# Patient Record
Sex: Male | Born: 1960 | Race: White | Hispanic: No | Smoking: Never smoker
Health system: Southern US, Community
[De-identification: ages and names within clinical notes are randomized; demographics above are authoritative.]

## PROBLEM LIST (undated history)

## (undated) DIAGNOSIS — G4700 Insomnia, unspecified: Secondary | ICD-10-CM

## (undated) DIAGNOSIS — D649 Anemia, unspecified: Secondary | ICD-10-CM

## (undated) DIAGNOSIS — K219 Gastro-esophageal reflux disease without esophagitis: Secondary | ICD-10-CM

## (undated) DIAGNOSIS — E119 Type 2 diabetes mellitus without complications: Secondary | ICD-10-CM

## (undated) DIAGNOSIS — G4730 Sleep apnea, unspecified: Secondary | ICD-10-CM

## (undated) DIAGNOSIS — F32A Depression, unspecified: Secondary | ICD-10-CM

## (undated) DIAGNOSIS — F329 Major depressive disorder, single episode, unspecified: Secondary | ICD-10-CM

## (undated) DIAGNOSIS — K274 Chronic or unspecified peptic ulcer, site unspecified, with hemorrhage: Secondary | ICD-10-CM

## (undated) DIAGNOSIS — F419 Anxiety disorder, unspecified: Secondary | ICD-10-CM

## (undated) DIAGNOSIS — F3112 Bipolar disorder, current episode manic without psychotic features, moderate: Secondary | ICD-10-CM

## (undated) DIAGNOSIS — I10 Essential (primary) hypertension: Secondary | ICD-10-CM

## (undated) DIAGNOSIS — G20A1 Parkinson's disease without dyskinesia, without mention of fluctuations: Secondary | ICD-10-CM

## (undated) DIAGNOSIS — E785 Hyperlipidemia, unspecified: Secondary | ICD-10-CM

## (undated) DIAGNOSIS — Z5189 Encounter for other specified aftercare: Secondary | ICD-10-CM

## (undated) DIAGNOSIS — G20 Parkinson's disease: Secondary | ICD-10-CM

## (undated) DIAGNOSIS — I739 Peripheral vascular disease, unspecified: Secondary | ICD-10-CM

## (undated) HISTORY — DX: Gastro-esophageal reflux disease without esophagitis: K21.9

## (undated) HISTORY — DX: Sleep apnea, unspecified: G47.30

## (undated) HISTORY — DX: Hyperlipidemia, unspecified: E78.5

## (undated) HISTORY — DX: Chronic or unspecified peptic ulcer, site unspecified, with hemorrhage: K27.4

## (undated) HISTORY — DX: Anemia, unspecified: D64.9

## (undated) HISTORY — DX: Depression, unspecified: F32.A

## (undated) HISTORY — DX: Encounter for other specified aftercare: Z51.89

## (undated) HISTORY — DX: Insomnia, unspecified: G47.00

## (undated) HISTORY — DX: Essential (primary) hypertension: I10

## (undated) HISTORY — PX: FOOT FRACTURE SURGERY: SHX645

## (undated) HISTORY — DX: Peripheral vascular disease, unspecified: I73.9

## (undated) HISTORY — DX: Bipolar disorder, current episode manic without psychotic features, moderate: F31.12

## (undated) HISTORY — DX: Major depressive disorder, single episode, unspecified: F32.9

## (undated) HISTORY — DX: Type 2 diabetes mellitus without complications: E11.9

## (undated) HISTORY — DX: Anxiety disorder, unspecified: F41.9

---

## 2004-04-30 ENCOUNTER — Other Ambulatory Visit: Payer: Self-pay

## 2004-09-12 ENCOUNTER — Ambulatory Visit: Payer: Self-pay | Admitting: Family Medicine

## 2006-05-26 ENCOUNTER — Ambulatory Visit: Payer: Self-pay | Admitting: Family Medicine

## 2007-05-07 ENCOUNTER — Emergency Department: Payer: Self-pay | Admitting: Internal Medicine

## 2008-03-19 DIAGNOSIS — F411 Generalized anxiety disorder: Secondary | ICD-10-CM | POA: Insufficient documentation

## 2009-05-07 ENCOUNTER — Ambulatory Visit: Payer: Self-pay | Admitting: Family Medicine

## 2009-05-10 ENCOUNTER — Emergency Department: Payer: Self-pay | Admitting: Emergency Medicine

## 2009-09-24 ENCOUNTER — Ambulatory Visit: Payer: Self-pay | Admitting: Physician Assistant

## 2013-04-20 ENCOUNTER — Ambulatory Visit: Payer: Self-pay | Admitting: Family Medicine

## 2014-03-15 DIAGNOSIS — M797 Fibromyalgia: Secondary | ICD-10-CM | POA: Insufficient documentation

## 2014-05-29 DIAGNOSIS — J383 Other diseases of vocal cords: Secondary | ICD-10-CM | POA: Insufficient documentation

## 2014-09-14 ENCOUNTER — Emergency Department: Payer: Self-pay | Admitting: Student

## 2014-11-14 ENCOUNTER — Ambulatory Visit: Payer: Self-pay | Admitting: Family Medicine

## 2015-04-04 ENCOUNTER — Encounter: Payer: Self-pay | Admitting: Emergency Medicine

## 2015-04-09 ENCOUNTER — Other Ambulatory Visit: Payer: Self-pay | Admitting: Family Medicine

## 2015-04-09 ENCOUNTER — Ambulatory Visit (INDEPENDENT_AMBULATORY_CARE_PROVIDER_SITE_OTHER): Payer: 59 | Admitting: Family Medicine

## 2015-04-09 ENCOUNTER — Encounter: Payer: Self-pay | Admitting: Family Medicine

## 2015-04-09 VITALS — BP 160/86 | HR 86 | Temp 97.7°F | Resp 16 | Ht 71.0 in | Wt 236.1 lb

## 2015-04-09 DIAGNOSIS — G4733 Obstructive sleep apnea (adult) (pediatric): Secondary | ICD-10-CM | POA: Diagnosis not present

## 2015-04-09 DIAGNOSIS — M797 Fibromyalgia: Secondary | ICD-10-CM | POA: Diagnosis not present

## 2015-04-09 DIAGNOSIS — E1143 Type 2 diabetes mellitus with diabetic autonomic (poly)neuropathy: Secondary | ICD-10-CM

## 2015-04-09 DIAGNOSIS — I10 Essential (primary) hypertension: Secondary | ICD-10-CM | POA: Diagnosis not present

## 2015-04-09 DIAGNOSIS — E669 Obesity, unspecified: Secondary | ICD-10-CM

## 2015-04-09 DIAGNOSIS — E1169 Type 2 diabetes mellitus with other specified complication: Secondary | ICD-10-CM | POA: Insufficient documentation

## 2015-04-09 DIAGNOSIS — F3012 Manic episode without psychotic symptoms, moderate: Secondary | ICD-10-CM

## 2015-04-09 DIAGNOSIS — G20A1 Parkinson's disease without dyskinesia, without mention of fluctuations: Secondary | ICD-10-CM

## 2015-04-09 DIAGNOSIS — E785 Hyperlipidemia, unspecified: Secondary | ICD-10-CM | POA: Diagnosis not present

## 2015-04-09 DIAGNOSIS — E119 Type 2 diabetes mellitus without complications: Secondary | ICD-10-CM

## 2015-04-09 DIAGNOSIS — H905 Unspecified sensorineural hearing loss: Secondary | ICD-10-CM | POA: Insufficient documentation

## 2015-04-09 DIAGNOSIS — M549 Dorsalgia, unspecified: Secondary | ICD-10-CM | POA: Insufficient documentation

## 2015-04-09 DIAGNOSIS — G20 Parkinson's disease: Secondary | ICD-10-CM

## 2015-04-09 DIAGNOSIS — G990 Autonomic neuropathy in diseases classified elsewhere: Secondary | ICD-10-CM | POA: Diagnosis not present

## 2015-04-09 DIAGNOSIS — E1165 Type 2 diabetes mellitus with hyperglycemia: Secondary | ICD-10-CM | POA: Diagnosis not present

## 2015-04-09 DIAGNOSIS — IMO0002 Reserved for concepts with insufficient information to code with codable children: Secondary | ICD-10-CM

## 2015-04-09 DIAGNOSIS — E786 Lipoprotein deficiency: Secondary | ICD-10-CM | POA: Insufficient documentation

## 2015-04-09 DIAGNOSIS — E1142 Type 2 diabetes mellitus with diabetic polyneuropathy: Secondary | ICD-10-CM | POA: Diagnosis not present

## 2015-04-09 DIAGNOSIS — R9431 Abnormal electrocardiogram [ECG] [EKG]: Secondary | ICD-10-CM | POA: Insufficient documentation

## 2015-04-09 LAB — GLUCOSE, POCT (MANUAL RESULT ENTRY): POC Glucose: 212 mg/dl — AB (ref 70–99)

## 2015-04-09 LAB — POCT GLYCOSYLATED HEMOGLOBIN (HGB A1C): Hemoglobin A1C: 10.5

## 2015-04-09 MED ORDER — INSULIN DETEMIR 100 UNIT/ML ~~LOC~~ SOLN: 10.0000 [IU] | Freq: Every day | SUBCUTANEOUS | Status: AC

## 2015-04-09 MED ORDER — SIMVASTATIN 40 MG PO TABS: 40.0000 mg | ORAL_TABLET | Freq: Every day | ORAL | Status: DC

## 2015-04-09 MED ORDER — GLYBURIDE-METFORMIN 2.5-500 MG PO TABS: 2.0000 | ORAL_TABLET | Freq: Two times a day (BID) | ORAL | Status: DC

## 2015-04-09 MED ORDER — INSULIN DETEMIR 100 UNIT/ML FLEXPEN: 10.0000 [IU] | PEN_INJECTOR | Freq: Every day | SUBCUTANEOUS | Status: DC

## 2015-04-09 MED ORDER — MELOXICAM 15 MG PO TABS: 15.0000 mg | ORAL_TABLET | Freq: Every day | ORAL | Status: DC

## 2015-04-09 NOTE — Progress Notes (Signed)
Name: Bryan Flowers   MRN: [id number]    DOB: 1961-01-08   Date:04/09/2015       Progress Note  Subjective  Chief Complaint  Chief Complaint  Patient presents with  . Hypertension  . Hyperlipidemia  . Depression  . Diabetes  . Tremors    Hypertension This is a chronic problem. The current episode started more than 1 year ago. The problem has been gradually worsening since onset. The problem is uncontrolled. Associated symptoms include palpitations. Pertinent negatives include no blurred vision, chest pain, headaches, neck pain, orthopnea or shortness of breath. Associated agents: Antipsychotics. Risk factors for coronary artery disease include diabetes mellitus, dyslipidemia, male gender, obesity, sedentary lifestyle and stress. Past treatments include ACE inhibitors and diuretics. The current treatment provides no improvement. Compliance problems include psychosocial issues.   Hyperlipidemia This is a chronic problem. The current episode started more than 1 year ago. The problem is uncontrolled. Recent lipid tests were reviewed and are high. Exacerbating diseases include diabetes. Factors aggravating his hyperlipidemia include fatty foods and thiazides. Pertinent negatives include no chest pain, focal weakness, myalgias or shortness of breath. Current antihyperlipidemic treatment includes statins (Vascepa). The current treatment provides mild improvement of lipids. There are no compliance problems.  Risk factors for coronary artery disease include diabetes mellitus, hypertension, male sex, obesity and a sedentary lifestyle.  Diabetes He presents for his follow-up diabetic visit. He has type 2 diabetes mellitus. His disease course has been improving. Hypoglycemia symptoms include nervousness/anxiousness. Pertinent negatives for hypoglycemia include no dizziness, headaches, seizures or tremors. Associated symptoms include fatigue. Pertinent negatives for diabetes include no blurred vision, no  chest pain, no weakness and no weight loss. There are no hypoglycemic complications. Symptoms are improving. Diabetic complications include peripheral neuropathy. Pertinent negatives for diabetic complications include no autonomic neuropathy or heart disease. Current diabetic treatment includes oral agent (triple therapy). His weight is decreasing steadily. He is following a diabetic diet. He rarely participates in exercise. His overall blood glucose range is >200 mg/dl. An ACE inhibitor/angiotensin II receptor blocker is contraindicated.     Parkinson's disease.  Patient's Parkinson's tremor is improved on his current Sinemet regimen.  Bipolar disorder and depression  Patient currently seen a psychiatrist on a regular basis and is currently on Depakote and Cymbalta along with lorazepam and is managing much better with that.  Past Medical History  Diagnosis Date  . Depression   . Diabetes mellitus without complication   . Hyperlipidemia   . Hypertension   . Bipolar 1 disorder, manic, moderate   . GERD (gastroesophageal reflux disease)   . Insomnia     History  Substance Use Topics  . Smoking status: Never Smoker   . Smokeless tobacco: Not on file  . Alcohol Use: No     Current outpatient prescriptions:  .  carbidopa-levodopa (SINEMET CR) 50-200 MG per tablet, , Disp: , Rfl:  .  DULoxetine (CYMBALTA) 60 MG capsule, , Disp: , Rfl:  .  glyBURIDE-metformin (GLUCOVANCE) 2.5-500 MG per tablet, , Disp: , Rfl:  .  lamoTRIgine (LAMICTAL) 200 MG tablet, , Disp: , Rfl:  .  LORazepam (ATIVAN) 0.5 MG tablet, , Disp: , Rfl:  .  meloxicam (MOBIC) 15 MG tablet, , Disp: , Rfl:  .  propranolol ER (INDERAL LA) 60 MG 24 hr capsule, , Disp: , Rfl:  .  quinapril-hydrochlorothiazide (ACCURETIC) 20-12.5 MG per tablet, , Disp: , Rfl:   No Known Allergies  Review of Systems  Constitutional: Positive for fatigue.  Negative for fever, chills and weight loss.       Obesity  HENT: Negative for  congestion, hearing loss, sore throat and tinnitus.   Eyes: Negative for blurred vision, double vision and redness.  Respiratory: Negative for cough, hemoptysis, shortness of breath and wheezing.   Cardiovascular: Positive for palpitations. Negative for chest pain, orthopnea, claudication and leg swelling.  Gastrointestinal: Negative for heartburn, nausea, vomiting, diarrhea, constipation and blood in stool.  Genitourinary: Negative for dysuria, urgency, frequency and hematuria.  Musculoskeletal: Negative for myalgias, back pain, joint pain, falls and neck pain.  Skin: Negative.  Negative for itching.  Neurological: Negative for dizziness, tingling, tremors, focal weakness, seizures, loss of consciousness, weakness and headaches.  Endo/Heme/Allergies: Does not bruise/bleed easily.  Psychiatric/Behavioral: Positive for depression and memory loss. Negative for substance abuse. The patient is nervous/anxious and has insomnia.      Objective  Filed Vitals:   04/09/15 0818  BP: 160/86  Pulse: 86  Temp: 97.7 F (36.5 C)  Resp: 16  Height: 5\' 11"  (1.803 m)  Weight: 236 lb 2 oz (107.106 kg)  SpO2: 98%     Physical Exam  Constitutional: He is oriented to person, place, and time and well-developed, well-nourished, and in no distress.  Obese  HENT:  Head: Normocephalic.  Eyes: EOM are normal. Pupils are equal, round, and reactive to light.  Neck: Normal range of motion. Neck supple. No thyromegaly present.  Cardiovascular: Normal rate, regular rhythm and normal heart sounds.   No murmur heard. Occasional ectopic beat  Pulmonary/Chest: Effort normal and breath sounds normal. No respiratory distress. He has no wheezes.  Abdominal: Soft. Bowel sounds are normal.  Musculoskeletal: Normal range of motion. He exhibits edema.  Lymphadenopathy:    He has no cervical adenopathy.  Neurological: He is alert and oriented to person, place, and time. No cranial nerve deficit. Gait normal.  Coordination normal.  Skin: Skin is warm and dry. No rash noted.  Psychiatric: Affect and judgment normal.  Flat affect      Assessment & Plan.diag  1.  Type 2 diabetes mellitus with hyperglycemia  - POCT HgB A1C - POCT Glucose (CBG) - Insulin Detemir (LEVEMIR FLEXTOUCH) 100 UNIT/ML Pen; Inject 10 Units into the skin daily at 10 pm.  Dispense: 15 mL; Refill: 11 - insulin detemir (LEVEMIR) injection 10 Units; Inject 0.1 mLs (10 Units total) into the skin daily. - Fructosamine  2. Essential hypertension Uncontrolled encourage compliance with his regimen and recheck in 1-2 months - Comprehensive Metabolic Panel (CMET)  3. Obstructive sleep apnea Use CPAP regularly     4. Bipolar I disorder, single manic episode, moderate Per psychiatry  5. HLD (hyperlipidemia)  - Lipid Profile - TSH  7. Idiopathic Parkinson's disease Improved currently on anti-Parkinson's regimen per his neurologist  8. Fibrositis Continues. Problem

## 2015-04-09 NOTE — Patient Instructions (Addendum)

## 2015-04-10 LAB — COMPREHENSIVE METABOLIC PANEL
A/G RATIO: 2.2 (ref 1.1–2.5)
ALT: 9 IU/L (ref 0–44)
AST: 15 IU/L (ref 0–40)
Albumin: 4.6 g/dL (ref 3.5–5.5)
Alkaline Phosphatase: 72 IU/L (ref 39–117)
BILIRUBIN TOTAL: 0.2 mg/dL (ref 0.0–1.2)
BUN/Creatinine Ratio: 24 — ABNORMAL HIGH (ref 9–20)
BUN: 22 mg/dL (ref 6–24)
CHLORIDE: 93 mmol/L — AB (ref 97–108)
CO2: 22 mmol/L (ref 18–29)
Calcium: 9.4 mg/dL (ref 8.7–10.2)
Creatinine, Ser: 0.9 mg/dL (ref 0.76–1.27)
GFR, EST AFRICAN AMERICAN: 112 mL/min/{1.73_m2} (ref >59)
GFR, EST NON AFRICAN AMERICAN: 97 mL/min/{1.73_m2} (ref >59)
Globulin, Total: 2.1 g/dL (ref 1.5–4.5)
Glucose: 233 mg/dL — ABNORMAL HIGH (ref 65–99)
POTASSIUM: 4 mmol/L (ref 3.5–5.2)
Sodium: 135 mmol/L (ref 134–144)
Total Protein: 6.7 g/dL (ref 6.0–8.5)

## 2015-04-10 LAB — LIPID PANEL
Chol/HDL Ratio: 9.9 ratio units — ABNORMAL HIGH (ref 0.0–5.0)
Cholesterol, Total: 307 mg/dL — ABNORMAL HIGH (ref 100–199)
HDL: 31 mg/dL — ABNORMAL LOW (ref >39)
Triglycerides: 752 mg/dL (ref 0–149)

## 2015-04-10 LAB — FRUCTOSAMINE: FRUCTOSAMINE: 469 umol/L — AB (ref 0–285)

## 2015-04-10 LAB — TSH: TSH: 1.49 u[IU]/mL (ref 0.450–4.500)

## 2015-05-09 ENCOUNTER — Ambulatory Visit: Payer: 59 | Admitting: Family Medicine

## 2015-05-15 ENCOUNTER — Encounter: Payer: Self-pay | Admitting: Family Medicine

## 2015-05-15 ENCOUNTER — Ambulatory Visit (INDEPENDENT_AMBULATORY_CARE_PROVIDER_SITE_OTHER): Payer: 59 | Admitting: Family Medicine

## 2015-05-15 VITALS — BP 118/78 | HR 82 | Temp 97.8°F | Resp 16 | Ht 71.0 in | Wt 234.3 lb

## 2015-05-15 DIAGNOSIS — F3012 Manic episode without psychotic symptoms, moderate: Secondary | ICD-10-CM | POA: Diagnosis not present

## 2015-05-15 DIAGNOSIS — E1165 Type 2 diabetes mellitus with hyperglycemia: Secondary | ICD-10-CM

## 2015-05-15 DIAGNOSIS — G990 Autonomic neuropathy in diseases classified elsewhere: Secondary | ICD-10-CM

## 2015-05-15 DIAGNOSIS — E1142 Type 2 diabetes mellitus with diabetic polyneuropathy: Secondary | ICD-10-CM | POA: Diagnosis not present

## 2015-05-15 DIAGNOSIS — IMO0002 Reserved for concepts with insufficient information to code with codable children: Secondary | ICD-10-CM

## 2015-05-15 DIAGNOSIS — I10 Essential (primary) hypertension: Secondary | ICD-10-CM | POA: Diagnosis not present

## 2015-05-15 DIAGNOSIS — E785 Hyperlipidemia, unspecified: Secondary | ICD-10-CM

## 2015-05-15 DIAGNOSIS — E1143 Type 2 diabetes mellitus with diabetic autonomic (poly)neuropathy: Secondary | ICD-10-CM

## 2015-05-15 NOTE — Progress Notes (Signed)
Name: Bryan Flowers   MRN: [id number]    DOB: [birth year]   Date:05/15/2015       Progress Note  Subjective  Chief Complaint  Chief Complaint  Patient presents with  . Diabetes    pt here for 1 mth f/u for DM and fructosimine  . Abnormal Lab    cholesterol     Diabetes He presents for his follow-up diabetic visit. He has type 2 diabetes mellitus. His disease course has been worsening. There are no hypoglycemic associated symptoms. Pertinent negatives for hypoglycemia include no dizziness, headaches, nervousness/anxiousness, seizures or tremors. Associated symptoms include blurred vision, fatigue and foot paresthesias. Pertinent negatives for diabetes include no chest pain, no weakness and no weight loss. Symptoms are worsening. Diabetic complications include peripheral neuropathy. Risk factors for coronary artery disease include diabetes mellitus, dyslipidemia, hypertension, male sex, obesity, sedentary lifestyle and stress. Current diabetic treatment includes insulin injections and oral agent (dual therapy). He is compliant with treatment most of the time. His weight is decreasing steadily. He is following a diabetic diet. His home blood glucose trend is increasing steadily. An ACE inhibitor/angiotensin II receptor blocker is being taken. He does not see a podiatrist. Hyperlipidemia This is a chronic problem. The current episode started more than 1 year ago. The problem is uncontrolled. Recent lipid tests were reviewed and are high. Medical and dietary and exercise noncompliance. Factors aggravating his hyperlipidemia include fatty foods. Pertinent negatives include no chest pain, focal weakness, myalgias or shortness of breath. Current antihyperlipidemic treatment includes statins. Compliance problems include psychosocial issues.   Hypertension This is a chronic problem. The current episode started more than 1 year ago. The problem is unchanged. The problem is controlled. Associated symptoms  include blurred vision. Pertinent negatives include no chest pain, headaches, neck pain, orthopnea, palpitations or shortness of breath. There are no associated agents to hypertension. Past treatments include ACE inhibitors, diuretics and beta blockers. The current treatment provides moderate improvement.    bipolar disorder  Patient currently seeing psychiatrist regular basis for his bipolar disorder. It is much more stable on his current regimen of Lamictal. Testosterone propranolol and duloxetine along with lorazepam.      Past Medical History  Diagnosis Date  . Depression   . Diabetes mellitus without complication   . Hyperlipidemia   . Hypertension   . Bipolar 1 disorder, manic, moderate   . GERD (gastroesophageal reflux disease)   . Insomnia     Social History  Substance Use Topics  . Smoking status: Never Smoker   . Smokeless tobacco: Not on file  . Alcohol Use: No     Current outpatient prescriptions:  .  carbidopa-levodopa (SINEMET CR) 50-200 MG per tablet, , Disp: , Rfl:  .  DULoxetine (CYMBALTA) 60 MG capsule, , Disp: , Rfl:  .  glyBURIDE-metformin (GLUCOVANCE) 2.5-500 MG per tablet, Take 2 tablets by mouth 2 (two) times daily with a meal., Disp: 120 tablet, Rfl: 3 .  lamoTRIgine (LAMICTAL) 200 MG tablet, , Disp: , Rfl:  .  LEVEMIR FLEXTOUCH 100 UNIT/ML Pen, , Disp: , Rfl:  .  LORazepam (ATIVAN) 0.5 MG tablet, , Disp: , Rfl:  .  meloxicam (MOBIC) 15 MG tablet, Take 1 tablet (15 mg total) by mouth daily., Disp: 30 tablet, Rfl: 1 .  propranolol ER (INDERAL LA) 60 MG 24 hr capsule, , Disp: , Rfl:  .  quinapril-hydrochlorothiazide (ACCURETIC) 20-12.5 MG per tablet, , Disp: , Rfl:  .  simvastatin (ZOCOR) 40 MG tablet, Take  1 tablet (40 mg total) by mouth at bedtime., Disp: 90 tablet, Rfl: 3  No Known Allergies  Review of Systems  Constitutional: Positive for fatigue. Negative for fever, chills and weight loss.  HENT: Negative for congestion, hearing loss, sore  throat and tinnitus.   Eyes: Positive for blurred vision. Negative for double vision and redness.  Respiratory: Negative for cough, hemoptysis and shortness of breath.   Cardiovascular: Negative for chest pain, palpitations, orthopnea, claudication and leg swelling.  Gastrointestinal: Negative for heartburn, nausea, vomiting, diarrhea, constipation and blood in stool.  Genitourinary: Negative for dysuria, urgency, frequency and hematuria.  Musculoskeletal: Negative for myalgias, back pain, joint pain, falls and neck pain.  Skin: Negative for itching.  Neurological: Positive for tingling and sensory change. Negative for dizziness, tremors, focal weakness, seizures, loss of consciousness, weakness and headaches.  Endo/Heme/Allergies: Does not bruise/bleed easily.  Psychiatric/Behavioral: Negative for depression and substance abuse. The patient is not nervous/anxious and does not have insomnia.        Bipolar disorder     Objective  Filed Vitals:   05/15/15 0826  BP: 118/78  Pulse: 82  Temp: 97.8 F (36.6 C)  Resp: 16  Height: 5\' 11"  (1.803 m)  Weight: 234 lb 5 oz (106.283 kg)  SpO2: 97%     Physical Exam  Constitutional: He is oriented to person, place, and time and well-developed, well-nourished, and in no distress.  Obese  HENT:  Head: Normocephalic.  Eyes: EOM are normal. Pupils are equal, round, and reactive to light.  Neck: Normal range of motion. Neck supple. No thyromegaly present.  Cardiovascular: Normal rate, regular rhythm and normal heart sounds.   No murmur heard. Pulmonary/Chest: Effort normal and breath sounds normal. No respiratory distress. He has no wheezes.  Abdominal: Soft. Bowel sounds are normal.  Musculoskeletal: Normal range of motion. He exhibits no edema.  Lymphadenopathy:    He has no cervical adenopathy.  Neurological: He is alert and oriented to person, place, and time. No cranial nerve deficit. Gait normal. Coordination normal.  Skin: Skin is  warm and dry. No rash noted.  Psychiatric:  Flat affect      Assessment & Plan  1. HLD (hyperlipidemia) Finally back on his statin regularly - Cholesterol, Total  2. Uncontrolled type 2 diabetes mellitus with peripheral autonomic neuropathy Patient instructed again on titrating his Lantus as he has not titrated up as instructed since last visit  3. Essential hypertension Well-controlled - Comprehensive Metabolic Panel (CMET) - TSH  4. Bipolar I disorder, single manic episode, moderate Best control since my knowing him now he's under the care of a psychiatrist and taking his medications regularly

## 2015-05-15 NOTE — Patient Instructions (Signed)
INCREASE INSULIN BY 1 UNIT QHS UNTIL FBS <150 MAX OF 50 UNITS

## 2015-05-16 LAB — COMPREHENSIVE METABOLIC PANEL
A/G RATIO: 2.3 (ref 1.1–2.5)
ALK PHOS: 63 IU/L (ref 39–117)
ALT: 11 IU/L (ref 0–44)
AST: 14 IU/L (ref 0–40)
Albumin: 4.8 g/dL (ref 3.5–5.5)
BUN/Creatinine Ratio: 20 (ref 9–20)
BUN: 23 mg/dL (ref 6–24)
Bilirubin Total: 0.3 mg/dL (ref 0.0–1.2)
CO2: 23 mmol/L (ref 18–29)
Calcium: 9.8 mg/dL (ref 8.7–10.2)
Chloride: 95 mmol/L — ABNORMAL LOW (ref 97–108)
Creatinine, Ser: 1.17 mg/dL (ref 0.76–1.27)
GFR calc Af Amer: 82 mL/min/{1.73_m2} (ref >59)
GFR, EST NON AFRICAN AMERICAN: 71 mL/min/{1.73_m2} (ref >59)
Globulin, Total: 2.1 g/dL (ref 1.5–4.5)
Glucose: 209 mg/dL — ABNORMAL HIGH (ref 65–99)
Potassium: 5.1 mmol/L (ref 3.5–5.2)
SODIUM: 139 mmol/L (ref 134–144)
Total Protein: 6.9 g/dL (ref 6.0–8.5)

## 2015-05-16 LAB — TSH: TSH: 1.7 u[IU]/mL (ref 0.450–4.500)

## 2015-05-16 LAB — CHOLESTEROL, TOTAL: Cholesterol, Total: 195 mg/dL (ref 100–199)

## 2015-08-15 ENCOUNTER — Ambulatory Visit: Payer: 59 | Admitting: Family Medicine

## 2015-08-19 ENCOUNTER — Ambulatory Visit (INDEPENDENT_AMBULATORY_CARE_PROVIDER_SITE_OTHER): Payer: 59 | Admitting: Family Medicine

## 2015-08-19 ENCOUNTER — Encounter: Payer: Self-pay | Admitting: Family Medicine

## 2015-08-19 VITALS — BP 148/86 | HR 83 | Temp 97.9°F | Resp 18 | Ht 71.0 in | Wt 245.0 lb

## 2015-08-19 DIAGNOSIS — G4733 Obstructive sleep apnea (adult) (pediatric): Secondary | ICD-10-CM | POA: Diagnosis not present

## 2015-08-19 DIAGNOSIS — F339 Major depressive disorder, recurrent, unspecified: Secondary | ICD-10-CM

## 2015-08-19 DIAGNOSIS — F419 Anxiety disorder, unspecified: Secondary | ICD-10-CM | POA: Insufficient documentation

## 2015-08-19 DIAGNOSIS — G20 Parkinson's disease: Secondary | ICD-10-CM

## 2015-08-19 DIAGNOSIS — I10 Essential (primary) hypertension: Secondary | ICD-10-CM

## 2015-08-19 DIAGNOSIS — K2100 Gastro-esophageal reflux disease with esophagitis, without bleeding: Secondary | ICD-10-CM

## 2015-08-19 DIAGNOSIS — E1143 Type 2 diabetes mellitus with diabetic autonomic (poly)neuropathy: Secondary | ICD-10-CM | POA: Diagnosis not present

## 2015-08-19 DIAGNOSIS — M797 Fibromyalgia: Secondary | ICD-10-CM

## 2015-08-19 DIAGNOSIS — E785 Hyperlipidemia, unspecified: Secondary | ICD-10-CM

## 2015-08-19 DIAGNOSIS — E1165 Type 2 diabetes mellitus with hyperglycemia: Secondary | ICD-10-CM | POA: Diagnosis not present

## 2015-08-19 DIAGNOSIS — E1169 Type 2 diabetes mellitus with other specified complication: Secondary | ICD-10-CM | POA: Diagnosis not present

## 2015-08-19 DIAGNOSIS — IMO0002 Reserved for concepts with insufficient information to code with codable children: Secondary | ICD-10-CM

## 2015-08-19 LAB — GLUCOSE, POCT (MANUAL RESULT ENTRY): POC Glucose: 213 mg/dl — AB (ref 70–99)

## 2015-08-19 LAB — POCT GLYCOSYLATED HEMOGLOBIN (HGB A1C): Hemoglobin A1C: 9.7

## 2015-08-19 MED ORDER — GEMFIBROZIL 600 MG PO TABS: 600.0000 mg | ORAL_TABLET | Freq: Two times a day (BID) | ORAL | Status: DC

## 2015-08-19 NOTE — Progress Notes (Signed)
Name: Bryan Flowers   MRN: [id number]    DOB: 1960/10/10   Date:08/19/2015       Progress Note  Subjective  Chief Complaint  Chief Complaint  Patient presents with  . Depression    4 month follow up  . Hypertension  . Diabetes  . Hyperlipidemia    HPI  Diabetes with neuropathy  Patient presents for follow-up of diabetes which is present for over 5 years. Is currently on a regimen of Lantus 10 units every at bedtime and glyburide with metformin . Patie. No particular aggravating his with their diet and exercise. There's been no hypoglycemic episodes and there no polyuria polydipsia polyphagia. His average fasting glucoses been in the low around mid upper 100s with a high around upper 100s to mid 200s . There is no end organ disease.  Last diabetic eye exam was less than less than 1 year ago.   Last visit with dietitian was earlier this year earlier this year. Last microalbumin was obtained 07/10/2014 and was 20 .   Hypertension   Patient presents for follow-up of hypertension. It has been present for over over 5 years.  Patient states that there is compliance with medical regimen which consists of propranolol 60 mg daily and QuinareticHCTZ 20-12 0.5 . There is no end organ disease. Cardiac risk factors include hypertension hyperlipidemia and diabetes.  Exercise regimen consist of minimal walking .  Diet consist of supposedly behaving .  Hyperlipidemia  Patient has a history of hyperlipidemia for over 5 years.  Current medical regimen consist of simvastatin 40 mg daily at bedtime .  Compliance is good .  Diet and exercise are currently followed intermittently .  Risk factors for cardiovascular disease include hyperlipidemia diabetes hypertension and sedentary lifestyle .   There have been no side effects from the medication.    Fibromyalgia  Patient diagnosed with fibromyalgia he is on meloxicam and takes some Tylenol over-the-counter as well.  Parkinson's disease  Since being on  Sinemet patient is noted some improvement in his symptomatology. He is also on propranolol for hypertension but also some some tremor.  Bipolar disorder  Patient currently under the care of a psychiatrist. He is on a regimen of Cymbalta 60 mg daily and Lamictal 20 mg daily along with lorazepam 0.5 mg on a when necessary basis. This is brought his symptomatology under much better control state.  Past Medical History  Diagnosis Date  . Depression   . Diabetes mellitus without complication ([place])   . Hyperlipidemia   . Hypertension   . Bipolar 1 disorder, manic, moderate ([place])   . GERD (gastroesophageal reflux disease)   . Insomnia     Social History  Substance Use Topics  . Smoking status: Never Smoker   . Smokeless tobacco: Not on file  . Alcohol Use: No     Current outpatient prescriptions:  .  carbidopa-levodopa (SINEMET CR) 50-200 MG per tablet, , Disp: , Rfl:  .  DULoxetine (CYMBALTA) 60 MG capsule, , Disp: , Rfl:  .  glyBURIDE-metformin (GLUCOVANCE) 2.5-500 MG per tablet, Take 2 tablets by mouth 2 (two) times daily with a meal., Disp: 120 tablet, Rfl: 3 .  lamoTRIgine (LAMICTAL) 200 MG tablet, , Disp: , Rfl:  .  LEVEMIR FLEXTOUCH 100 UNIT/ML Pen, , Disp: , Rfl:  .  LORazepam (ATIVAN) 0.5 MG tablet, , Disp: , Rfl:  .  meloxicam (MOBIC) 15 MG tablet, Take 1 tablet (15 mg total) by mouth daily., Disp: 30 tablet, Rfl:  1 .  propranolol ER (INDERAL LA) 60 MG 24 hr capsule, , Disp: , Rfl:  .  quinapril-hydrochlorothiazide (ACCURETIC) 20-12.5 MG per tablet, , Disp: , Rfl:  .  simvastatin (ZOCOR) 40 MG tablet, Take 1 tablet (40 mg total) by mouth at bedtime., Disp: 90 tablet, Rfl: 3  No Known Allergies  Review of Systems  Constitutional: Positive for malaise/fatigue. Negative for fever, chills and weight loss.  HENT: Negative for congestion, hearing loss, sore throat and tinnitus.   Eyes: Negative for blurred vision, double vision and redness.  Respiratory: Negative for cough,  hemoptysis and shortness of breath.   Cardiovascular: Negative for chest pain, palpitations, orthopnea, claudication and leg swelling.  Gastrointestinal: Negative for heartburn, nausea, vomiting, diarrhea, constipation and blood in stool.  Genitourinary: Negative for dysuria, urgency, frequency and hematuria.  Musculoskeletal: Positive for myalgias and joint pain. Negative for back pain, falls and neck pain.  Skin: Negative for itching.  Neurological: Positive for tremors. Negative for dizziness, tingling, focal weakness, seizures, loss of consciousness, weakness and headaches.  Endo/Heme/Allergies: Does not bruise/bleed easily.  Psychiatric/Behavioral: Positive for depression. Negative for suicidal ideas and substance abuse. The patient is nervous/anxious and has insomnia.      Objective  Filed Vitals:   08/19/15 1127  BP: 148/86  Pulse: 83  Temp: 97.9 F (36.6 C)  TempSrc: Oral  Resp: 18  Height: 5\' 11"  (1.803 m)  Weight: 245 lb (111.131 kg)  SpO2: 95%     Physical Exam  Constitutional: He is oriented to person, place, and time.  Obese male in no acute distress  HENT:  Head: Normocephalic.  Eyes: EOM are normal. Pupils are equal, round, and reactive to light.  Neck: Normal range of motion. Neck supple. No thyromegaly ( 2 tabs twice a day) present.  Cardiovascular: Normal rate, regular rhythm and normal heart sounds.   No murmur heard. Pulmonary/Chest: Effort normal and breath sounds normal. No respiratory distress. He has no wheezes.  Abdominal: Soft. Bowel sounds are normal.  Musculoskeletal: Normal range of motion. He exhibits no edema.  Lymphadenopathy:    He has no cervical adenopathy.  Neurological: He is alert and oriented to person, place, and time. No cranial nerve deficit. Gait normal. Coordination normal.  Skin: Skin is warm and dry. No rash noted.  Psychiatric: Judgment normal.  Depressed mood is improved      Assessment & Plan  1. Type 2 diabetes  mellitus with other specified complication (HCC) rarely - POCT HgB A1C - POCT Glucose (CBG) - Comprehensive Metabolic Panel (CMET) - TSH  2. Uncontrolled type 2 diabetes mellitus with peripheral autonomic neuropathy (HCC) Patient's is to increase his Levemir by 4 units tonight and then by 1 unit daily that his fasting sugars over 120 - Comprehensive Metabolic Panel (CMET) - TSH  3. Essential hypertension Not at goal encourage diet and exercise. Will need increased regimen if not under control return visit in 4 months - TSH  4. Obstructive sleep apnea Continue to encourage uses her CPAP  5. Reflux esophagitis Continue PPI  6. Idiopathic Parkinson's disease ([place]) Improved on his current regimen by neurologist  7. Fibromyalgia Improved on his current regimen per neurologist - Comprehensive Metabolic Panel (CMET) - TSH  8. Hyperlipidemia Blood work - Lipid panel  9. Recurrent major depressive disorder, remission status unspecified ([place]) per psychiatrist

## 2015-11-26 ENCOUNTER — Other Ambulatory Visit: Payer: Self-pay

## 2015-11-26 MED ORDER — QUINAPRIL-HYDROCHLOROTHIAZIDE 20-12.5 MG PO TABS: 2.0000 | ORAL_TABLET | Freq: Every day | ORAL | Status: DC

## 2015-12-18 ENCOUNTER — Ambulatory Visit: Payer: 59 | Admitting: Family Medicine

## 2016-01-16 ENCOUNTER — Ambulatory Visit (INDEPENDENT_AMBULATORY_CARE_PROVIDER_SITE_OTHER): Payer: BLUE CROSS/BLUE SHIELD | Admitting: Physician Assistant

## 2016-01-16 ENCOUNTER — Encounter: Payer: Self-pay | Admitting: Physician Assistant

## 2016-01-16 VITALS — BP 120/74 | HR 86 | Temp 98.7°F | Resp 16 | Ht 71.0 in | Wt 233.0 lb

## 2016-01-16 DIAGNOSIS — J0140 Acute pansinusitis, unspecified: Secondary | ICD-10-CM

## 2016-01-16 MED ORDER — AMOXICILLIN-POT CLAVULANATE 875-125 MG PO TABS: 1.0000 | ORAL_TABLET | Freq: Two times a day (BID) | ORAL | Status: DC

## 2016-01-16 NOTE — Patient Instructions (Signed)

## 2016-01-16 NOTE — Progress Notes (Signed)
Patient: Bryan Flowers Male    DOB: 08-02-1961   55 y.o.   MRN: [id number] Visit Date: 01/16/2016  Today's Provider: Mar Daring, PA-C   No chief complaint on file.  Subjective:    Sinusitis This is a new problem. The current episode started 1 to 4 weeks ago (2 weeks). The problem has been gradually worsening since onset. The maximum temperature recorded prior to his arrival was 100.4 - 100.9 F. The fever has been present for 5 days or more (1 week). The pain is moderate. Associated symptoms include chills, congestion, coughing, ear pain, headaches, a hoarse voice, shortness of breath, sinus pressure, sneezing, a sore throat and swollen glands. Pertinent negatives include no diaphoresis or neck pain. (Left ear feels congested with mild pain) Treatments tried: nyquil. The treatment provided mild relief.   Sinus pressure for 2 weeks. Low-grade fever, mild sore throat, headache, facial pressure and left ear congestion.  No Known Allergies Previous Medications   CARBIDOPA-LEVODOPA (SINEMET CR) 50-200 MG PER TABLET       DULOXETINE (CYMBALTA) 60 MG CAPSULE       GEMFIBROZIL (LOPID) 600 MG TABLET    Take 1 tablet (600 mg total) by mouth 2 (two) times daily before a meal.   GLYBURIDE-METFORMIN (GLUCOVANCE) 2.5-500 MG PER TABLET    Take 2 tablets by mouth 2 (two) times daily with a meal.   LAMOTRIGINE (LAMICTAL) 200 MG TABLET       LEVEMIR FLEXTOUCH 100 UNIT/ML PEN       LORAZEPAM (ATIVAN) 0.5 MG TABLET       MELOXICAM (MOBIC) 15 MG TABLET    Take 1 tablet (15 mg total) by mouth daily.   PROPRANOLOL ER (INDERAL LA) 60 MG 24 HR CAPSULE       QUINAPRIL-HYDROCHLOROTHIAZIDE (ACCURETIC) 20-12.5 MG TABLET    Take 2 tablets by mouth daily.   SIMVASTATIN (ZOCOR) 40 MG TABLET    Take 1 tablet (40 mg total) by mouth at bedtime.    Review of Systems  Constitutional: Positive for fever (low grade) and chills. Negative for diaphoresis and appetite change.  HENT: Positive for congestion,  ear pain, hoarse voice, sinus pressure, sneezing and sore throat.   Respiratory: Positive for cough and shortness of breath. Negative for chest tightness and wheezing.   Cardiovascular: Negative for chest pain and palpitations.  Gastrointestinal: Negative for nausea, vomiting and abdominal pain.  Musculoskeletal: Negative for neck pain.  Neurological: Positive for headaches. Negative for dizziness.    Social History  Substance Use Topics  . Smoking status: Never Smoker   . Smokeless tobacco: Not on file  . Alcohol Use: No   Objective:   There were no vitals taken for this visit.  Physical Exam  Constitutional: He appears well-developed and well-nourished. No distress.  HENT:  Head: Normocephalic and atraumatic.  Right Ear: Hearing, external ear and ear canal normal. Tympanic membrane is not erythematous and not bulging. A middle ear effusion is present.  Left Ear: Hearing, external ear and ear canal normal. Tympanic membrane is not erythematous and not bulging. A middle ear effusion is present.  Nose: Mucosal edema and rhinorrhea present. Right sinus exhibits maxillary sinus tenderness and frontal sinus tenderness. Left sinus exhibits maxillary sinus tenderness and frontal sinus tenderness.  Mouth/Throat: Uvula is midline and mucous membranes are normal. Posterior oropharyngeal erythema present. No oropharyngeal exudate or posterior oropharyngeal edema.  Eyes: Conjunctivae and EOM are normal. Pupils are equal, round, and reactive  to light. Right eye exhibits no discharge. Left eye exhibits no discharge.  Neck: Normal range of motion. Neck supple. No tracheal deviation present. No Brudzinski's sign and no Kernig's sign noted. No thyromegaly present.  Cardiovascular: Normal rate and regular rhythm.  Exam reveals no gallop and no friction rub.   Murmur heard.  Systolic murmur is present with a grade of 3/6  Pulmonary/Chest: Effort normal and breath sounds normal. No stridor. No respiratory  distress. He has no wheezes. He has no rales.  Lymphadenopathy:    He has no cervical adenopathy.  Skin: Skin is warm and dry. He is not diaphoretic.  Vitals reviewed.       Assessment & Plan:     1. Acute pansinusitis, recurrence not specified Worsening symptoms that has not responded to OTC medications. Will give augmentin as below. Discussed switching from nyquil to robitussin for diabetics to avoid sugar increases. Advised to add loratadine for allergy control and to prevent recurrent sinus infection. He needs to stay well hydrated and get plenty of rest. He is to call if symptoms fail to improve or worsen. - amoxicillin-clavulanate (AUGMENTIN) 875-125 MG tablet; Take 1 tablet by mouth 2 (two) times daily.  Dispense: 20 tablet; Refill: 0       Mar Daring, PA-C  [facility]

## 2016-01-21 ENCOUNTER — Ambulatory Visit: Payer: Self-pay | Admitting: Family Medicine

## 2016-01-23 ENCOUNTER — Other Ambulatory Visit: Payer: Self-pay | Admitting: Family Medicine

## 2016-02-20 DIAGNOSIS — F3181 Bipolar II disorder: Secondary | ICD-10-CM | POA: Diagnosis not present

## 2016-02-24 ENCOUNTER — Ambulatory Visit: Payer: Self-pay | Admitting: Family Medicine

## 2016-02-24 ENCOUNTER — Other Ambulatory Visit: Payer: Self-pay | Admitting: Family Medicine

## 2016-02-27 ENCOUNTER — Ambulatory Visit (INDEPENDENT_AMBULATORY_CARE_PROVIDER_SITE_OTHER): Payer: PPO | Admitting: Family Medicine

## 2016-02-27 ENCOUNTER — Encounter: Payer: Self-pay | Admitting: Family Medicine

## 2016-02-27 VITALS — BP 127/80 | HR 89 | Temp 98.2°F | Resp 17 | Ht 71.0 in | Wt 235.9 lb

## 2016-02-27 DIAGNOSIS — J0100 Acute maxillary sinusitis, unspecified: Secondary | ICD-10-CM

## 2016-02-27 MED ORDER — AZITHROMYCIN 250 MG PO TABS
ORAL_TABLET | ORAL | Status: DC
Start: 2016-02-27 — End: 2016-03-12

## 2016-02-27 MED ORDER — FLUTICASONE PROPIONATE 50 MCG/ACT NA SUSP: 2.0000 | Freq: Every day | NASAL | Status: DC

## 2016-02-27 NOTE — Progress Notes (Signed)
Name: Bryan Flowers   MRN: [id number]    DOB: 1960/12/29   Date:02/27/2016       Progress Note  Subjective  Chief Complaint  Chief Complaint  Patient presents with  . Follow-up    4 mo  . Diabetes  . Hyperlipidemia    Sinusitis This is a recurrent problem. There has been no fever. Associated symptoms include headaches, sinus pressure and a sore throat. Pertinent negatives include no chills. Treatments tried: Was prescribed Augmentin for sinusitis, which has not completely cleared his symptoms.     Past Medical History  Diagnosis Date  . Depression   . Diabetes mellitus without complication ([place])   . Hyperlipidemia   . Hypertension   . Bipolar 1 disorder, manic, moderate ([place])   . GERD (gastroesophageal reflux disease)   . Insomnia     Past Surgical History  Procedure Laterality Date  . Foot fracture surgery Right     Family History  Problem Relation Age of Onset  . Cancer Mother   . Heart disease Father     Social History   Social History  . Marital Status: Married    Spouse Name: N/A  . Number of Children: N/A  . Years of Education: N/A   Occupational History  . Not on file.   Social History Main Topics  . Smoking status: Never Smoker   . Smokeless tobacco: Not on file  . Alcohol Use: No  . Drug Use: No  . Sexual Activity:    Partners: Female   Other Topics Concern  . Not on file   Social History Narrative     Current outpatient prescriptions:  .  carbidopa-levodopa (SINEMET CR) 50-200 MG per tablet, Take 1 tablet by mouth 3 (three) times daily as needed. , Disp: , Rfl:  .  DULoxetine (CYMBALTA) 60 MG capsule, Take 60 mg by mouth daily. , Disp: , Rfl:  .  gemfibrozil (LOPID) 600 MG tablet, Take 1 tablet (600 mg total) by mouth 2 (two) times daily before a meal., Disp: 60 tablet, Rfl: 5 .  glyBURIDE-metformin (GLUCOVANCE) 2.5-500 MG tablet, TAKE 2 TABLETS BY MOUTH TWICE DAILY WITH MEALS, Disp: 120 tablet, Rfl: 0 .  lamoTRIgine (LAMICTAL) 200  MG tablet, Take 200 mg by mouth at bedtime. , Disp: , Rfl:  .  LEVEMIR FLEXTOUCH 100 UNIT/ML Pen, Inject 10 Units into the skin daily. , Disp: , Rfl:  .  LORazepam (ATIVAN) 0.5 MG tablet, Take 0.5 mg by mouth 3 (three) times daily as needed. , Disp: , Rfl:  .  propranolol ER (INDERAL LA) 60 MG 24 hr capsule, Take 60 mg by mouth daily. , Disp: , Rfl:  .  QUEtiapine (SEROQUEL) 50 MG tablet, Take 50 mg by mouth., Disp: , Rfl:  .  quinapril-hydrochlorothiazide (ACCURETIC) 20-12.5 MG tablet, Take 2 tablets by mouth daily., Disp: 60 tablet, Rfl: 0 .  rOPINIRole (REQUIP) 1 MG tablet, TK 1 T PO JUST BEFORE BEDTIME, Disp: , Rfl: 5 .  simvastatin (ZOCOR) 40 MG tablet, Take 1 tablet (40 mg total) by mouth at bedtime., Disp: 90 tablet, Rfl: 3  No Known Allergies   Review of Systems  Constitutional: Positive for malaise/fatigue. Negative for fever and chills.  HENT: Positive for sinus pressure and sore throat.   Musculoskeletal: Positive for myalgias.  Neurological: Positive for headaches.    Objective  Filed Vitals:   02/27/16 1453  BP: 127/80  Pulse: 89  Temp: 98.2 F (36.8 C)  TempSrc: Oral  Resp: 17  Height: 5\' 11"  (1.803 m)  Weight: 235 lb 14.4 oz (107.004 kg)  SpO2: 96%    Physical Exam  Constitutional: He is well-developed, well-nourished, and in no distress.  HENT:  Right Ear: Tympanic membrane and ear canal normal.  Left Ear: Tympanic membrane and ear canal normal.  Nose: Right sinus exhibits maxillary sinus tenderness. Left sinus exhibits maxillary sinus tenderness.  Mouth/Throat: Posterior oropharyngeal erythema present.  Cardiovascular: Normal rate and regular rhythm.   Pulmonary/Chest: Effort normal and breath sounds normal.  Nursing note and vitals reviewed.     Assessment & Plan  1. Subacute maxillary sinusitis We will start on Z-Pak, advised to temporarily discontinue quetiapine because of the additive risk of QT prolongation with macrolide. Follow-up in 2  weeks - azithromycin (ZITHROMAX) 250 MG tablet; 2 tabs po day 1, then 1 tab po q day x 4 days.  Dispense: 6 tablet; Refill: 0 - fluticasone (FLONASE) 50 MCG/ACT nasal spray; Place 2 sprays into both nostrils daily.  Dispense: 16 g; Refill: 0   Nimesh Riolo [facility] 02/27/2016 3:23 PM

## 2016-03-12 ENCOUNTER — Encounter: Payer: Self-pay | Admitting: Family Medicine

## 2016-03-12 ENCOUNTER — Ambulatory Visit (INDEPENDENT_AMBULATORY_CARE_PROVIDER_SITE_OTHER): Payer: PPO | Admitting: Family Medicine

## 2016-03-12 VITALS — BP 116/74 | HR 86 | Temp 98.2°F | Resp 17 | Ht 71.0 in | Wt 238.4 lb

## 2016-03-12 DIAGNOSIS — E785 Hyperlipidemia, unspecified: Secondary | ICD-10-CM

## 2016-03-12 DIAGNOSIS — I10 Essential (primary) hypertension: Secondary | ICD-10-CM

## 2016-03-12 DIAGNOSIS — E1165 Type 2 diabetes mellitus with hyperglycemia: Secondary | ICD-10-CM | POA: Diagnosis not present

## 2016-03-12 DIAGNOSIS — IMO0001 Reserved for inherently not codable concepts without codable children: Secondary | ICD-10-CM

## 2016-03-12 LAB — POCT GLYCOSYLATED HEMOGLOBIN (HGB A1C): Hemoglobin A1C: 9.1

## 2016-03-12 LAB — POCT UA - MICROALBUMIN
Albumin/Creatinine Ratio, Urine, POC: NEGATIVE
CREATININE, POC: NEGATIVE mg/dL
Microalbumin Ur, POC: NEGATIVE mg/L

## 2016-03-12 LAB — GLUCOSE, POCT (MANUAL RESULT ENTRY): POC Glucose: 196 mg/dl — AB (ref 70–99)

## 2016-03-12 MED ORDER — EXENATIDE ER 2 MG ~~LOC~~ PEN: 2.0000 mg | PEN_INJECTOR | SUBCUTANEOUS | Status: DC

## 2016-03-12 MED ORDER — SIMVASTATIN 40 MG PO TABS: 40.0000 mg | ORAL_TABLET | Freq: Every day | ORAL | Status: DC

## 2016-03-12 MED ORDER — QUINAPRIL-HYDROCHLOROTHIAZIDE 20-12.5 MG PO TABS: 2.0000 | ORAL_TABLET | Freq: Every day | ORAL | Status: DC

## 2016-03-12 NOTE — Progress Notes (Signed)
Name: Bryan Flowers   MRN: [id number]    DOB: 1961/08/05   Date:03/12/2016       Progress Note  Subjective  Chief Complaint  Chief Complaint  Patient presents with  . Follow-up    2 wk DM  . Diabetes  . Hyperlipidemia    Diabetes He presents for his follow-up diabetic visit. He has type 2 diabetes mellitus. His disease course has been stable. There are no hypoglycemic associated symptoms. Pertinent negatives for hypoglycemia include no headaches. Associated symptoms include fatigue, polydipsia and polyuria. Pertinent negatives for diabetes include no blurred vision and no chest pain. There are no hypoglycemic complications. Current diabetic treatment includes oral agent (dual therapy) and intensive insulin program. He participates in exercise three times a week (Walks three times a week). His breakfast blood glucose range is generally 180-200 mg/dl. An ACE inhibitor/angiotensin II receptor blocker is being taken. Eye exam is current.  Hyperlipidemia This is a chronic problem. Lipid results: Pt. has not gone for lab work in the last year to have his cholesterol checked. Pertinent negatives include no chest pain, leg pain, myalgias or shortness of breath. Current antihyperlipidemic treatment includes statins and fibric acid derivatives.  Hypertension This is a chronic problem. The problem is unchanged. The problem is controlled. Pertinent negatives include no blurred vision, chest pain, headaches, palpitations or shortness of breath. Past treatments include ACE inhibitors and diuretics.    Past Medical History  Diagnosis Date  . Depression   . Diabetes mellitus without complication ([place])   . Hyperlipidemia   . Hypertension   . Bipolar 1 disorder, manic, moderate ([place])   . GERD (gastroesophageal reflux disease)   . Insomnia     Past Surgical History  Procedure Laterality Date  . Foot fracture surgery Right     Family History  Problem Relation Age of Onset  . Cancer Mother   .  Heart disease Father     Social History   Social History  . Marital Status: Married    Spouse Name: N/A  . Number of Children: N/A  . Years of Education: N/A   Occupational History  . Not on file.   Social History Main Topics  . Smoking status: Never Smoker   . Smokeless tobacco: Not on file  . Alcohol Use: No  . Drug Use: No  . Sexual Activity:    Partners: Female   Other Topics Concern  . Not on file   Social History Narrative     Current outpatient prescriptions:  .  carbidopa-levodopa (SINEMET CR) 50-200 MG per tablet, Take 1 tablet by mouth 3 (three) times daily as needed. , Disp: , Rfl:  .  DULoxetine (CYMBALTA) 60 MG capsule, Take 60 mg by mouth daily. , Disp: , Rfl:  .  fluticasone (FLONASE) 50 MCG/ACT nasal spray, Place 2 sprays into both nostrils daily., Disp: 16 g, Rfl: 0 .  gemfibrozil (LOPID) 600 MG tablet, Take 1 tablet (600 mg total) by mouth 2 (two) times daily before a meal., Disp: 60 tablet, Rfl: 5 .  glyBURIDE-metformin (GLUCOVANCE) 2.5-500 MG tablet, TAKE 2 TABLETS BY MOUTH TWICE DAILY WITH MEALS, Disp: 120 tablet, Rfl: 0 .  lamoTRIgine (LAMICTAL) 200 MG tablet, Take 200 mg by mouth at bedtime. , Disp: , Rfl:  .  LEVEMIR FLEXTOUCH 100 UNIT/ML Pen, Inject 10 Units into the skin daily. , Disp: , Rfl:  .  LORazepam (ATIVAN) 0.5 MG tablet, Take 0.5 mg by mouth 3 (three) times daily as needed. ,  Disp: , Rfl:  .  propranolol ER (INDERAL LA) 60 MG 24 hr capsule, Take 60 mg by mouth daily. , Disp: , Rfl:  .  QUEtiapine (SEROQUEL) 50 MG tablet, Take 50 mg by mouth., Disp: , Rfl:  .  quinapril-hydrochlorothiazide (ACCURETIC) 20-12.5 MG tablet, Take 2 tablets by mouth daily., Disp: 60 tablet, Rfl: 0 .  rOPINIRole (REQUIP) 1 MG tablet, TK 1 T PO JUST BEFORE BEDTIME, Disp: , Rfl: 5 .  simvastatin (ZOCOR) 40 MG tablet, Take 1 tablet (40 mg total) by mouth at bedtime., Disp: 90 tablet, Rfl: 3  No Known Allergies   Review of Systems  Constitutional: Positive for  fatigue.  Eyes: Negative for blurred vision.  Respiratory: Negative for shortness of breath.   Cardiovascular: Negative for chest pain and palpitations.  Musculoskeletal: Negative for myalgias.  Neurological: Negative for headaches.  Endo/Heme/Allergies: Positive for polydipsia.   Objective  Filed Vitals:   03/12/16 0908  BP: 116/74  Pulse: 86  Temp: 98.2 F (36.8 C)  TempSrc: Oral  Resp: 17  Height: 5\' 11"  (1.803 m)  Weight: 238 lb 6.4 oz (108.138 kg)  SpO2: 96%    Physical Exam  Constitutional: He is oriented to person, place, and time and well-developed, well-nourished, and in no distress.  Cardiovascular: Normal rate, regular rhythm, S1 normal and S2 normal.   Murmur heard.  Systolic murmur is present with a grade of 2/6  Pulmonary/Chest: Effort normal and breath sounds normal. He has no wheezes. He has no rales.  Abdominal: Soft. Bowel sounds are normal. There is no tenderness.  Musculoskeletal:       Right ankle: He exhibits swelling.       Left ankle: He exhibits swelling.  Trace edema bilateral ankles.  Neurological: He is alert and oriented to person, place, and time.  Nursing note and vitals reviewed.     Assessment & Plan  1. Uncontrolled type 2 diabetes mellitus without complication, without long-term current use of insulin (HCC) A1c is 9.1%, considered poorly controlled. We will start on Bydureon, continue on Levemir and Glucovance. - POCT HgB A1C - POCT Glucose (CBG) - POCT UA - Microalbumin - Exenatide ER 2 MG PEN; Inject 2 mg into the skin once a week.  Dispense: 1 each; Refill: 11  2. HLD (hyperlipidemia) Obtain FLP and consider medication adjustment - Comprehensive Metabolic Panel (CMET) - Lipid Profile - simvastatin (ZOCOR) 40 MG tablet; Take 1 tablet (40 mg total) by mouth at bedtime.  Dispense: 90 tablet; Refill: 1  3. Essential hypertension  - quinapril-hydrochlorothiazide (ACCURETIC) 20-12.5 MG tablet; Take 2 tablets by mouth daily.   Dispense: 180 tablet; Refill: 0   Tanija Germani [facility] 03/12/2016 9:15 AM

## 2016-03-13 LAB — COMPREHENSIVE METABOLIC PANEL
ALBUMIN: 4.3 g/dL (ref 3.5–5.5)
ALT: 13 IU/L (ref 0–44)
AST: 12 IU/L (ref 0–40)
Albumin/Globulin Ratio: 2 (ref 1.2–2.2)
Alkaline Phosphatase: 59 IU/L (ref 39–117)
BUN / CREAT RATIO: 24 — AB (ref 9–20)
BUN: 22 mg/dL (ref 6–24)
Bilirubin Total: <0.2 mg/dL (ref 0.0–1.2)
CALCIUM: 9.3 mg/dL (ref 8.7–10.2)
CO2: 24 mmol/L (ref 18–29)
CREATININE: 0.91 mg/dL (ref 0.76–1.27)
Chloride: 100 mmol/L (ref 96–106)
GFR calc Af Amer: 110 mL/min/{1.73_m2} (ref >59)
GFR, EST NON AFRICAN AMERICAN: 95 mL/min/{1.73_m2} (ref >59)
GLOBULIN, TOTAL: 2.1 g/dL (ref 1.5–4.5)
Glucose: 263 mg/dL — ABNORMAL HIGH (ref 65–99)
Potassium: 5.1 mmol/L (ref 3.5–5.2)
SODIUM: 140 mmol/L (ref 134–144)
Total Protein: 6.4 g/dL (ref 6.0–8.5)

## 2016-03-13 LAB — LIPID PANEL
CHOL/HDL RATIO: 4.7 ratio (ref 0.0–5.0)
Cholesterol, Total: 154 mg/dL (ref 100–199)
HDL: 33 mg/dL — ABNORMAL LOW (ref >39)
LDL CALC: 44 mg/dL (ref 0–99)
Triglycerides: 387 mg/dL — ABNORMAL HIGH (ref 0–149)
VLDL Cholesterol Cal: 77 mg/dL — ABNORMAL HIGH (ref 5–40)

## 2016-03-15 ENCOUNTER — Encounter (HOSPITAL_COMMUNITY): Payer: Self-pay | Admitting: Internal Medicine

## 2016-03-15 ENCOUNTER — Emergency Department
Admission: EM | Admit: 2016-03-15 | Discharge: 2016-03-15 | Disposition: A | Discharge status: Other Acute Inpatient Hospital | Payer: PPO | Attending: Emergency Medicine | Admitting: Emergency Medicine

## 2016-03-15 ENCOUNTER — Observation Stay (HOSPITAL_COMMUNITY)
Admission: AD | Admit: 2016-03-15 | Discharge: 2016-03-17 | Disposition: A | Discharge status: Home / Self Care | Payer: PPO | Source: Other Acute Inpatient Hospital | Attending: Internal Medicine | Admitting: Internal Medicine

## 2016-03-15 ENCOUNTER — Encounter: Payer: Self-pay | Admitting: Emergency Medicine

## 2016-03-15 DIAGNOSIS — M797 Fibromyalgia: Secondary | ICD-10-CM | POA: Insufficient documentation

## 2016-03-15 DIAGNOSIS — R531 Weakness: Secondary | ICD-10-CM | POA: Diagnosis not present

## 2016-03-15 DIAGNOSIS — E1165 Type 2 diabetes mellitus with hyperglycemia: Secondary | ICD-10-CM | POA: Insufficient documentation

## 2016-03-15 DIAGNOSIS — R000 Tachycardia, unspecified: Secondary | ICD-10-CM | POA: Diagnosis not present

## 2016-03-15 DIAGNOSIS — Z6833 Body mass index (BMI) 33.0-33.9, adult: Secondary | ICD-10-CM | POA: Insufficient documentation

## 2016-03-15 DIAGNOSIS — D509 Iron deficiency anemia, unspecified: Secondary | ICD-10-CM | POA: Diagnosis not present

## 2016-03-15 DIAGNOSIS — K269 Duodenal ulcer, unspecified as acute or chronic, without hemorrhage or perforation: Secondary | ICD-10-CM | POA: Diagnosis not present

## 2016-03-15 DIAGNOSIS — Z79899 Other long term (current) drug therapy: Secondary | ICD-10-CM | POA: Insufficient documentation

## 2016-03-15 DIAGNOSIS — K449 Diaphragmatic hernia without obstruction or gangrene: Secondary | ICD-10-CM | POA: Diagnosis not present

## 2016-03-15 DIAGNOSIS — Z794 Long term (current) use of insulin: Secondary | ICD-10-CM | POA: Diagnosis not present

## 2016-03-15 DIAGNOSIS — E119 Type 2 diabetes mellitus without complications: Secondary | ICD-10-CM | POA: Insufficient documentation

## 2016-03-15 DIAGNOSIS — E785 Hyperlipidemia, unspecified: Secondary | ICD-10-CM | POA: Insufficient documentation

## 2016-03-15 DIAGNOSIS — Z7951 Long term (current) use of inhaled steroids: Secondary | ICD-10-CM | POA: Insufficient documentation

## 2016-03-15 DIAGNOSIS — R195 Other fecal abnormalities: Secondary | ICD-10-CM | POA: Diagnosis not present

## 2016-03-15 DIAGNOSIS — K921 Melena: Secondary | ICD-10-CM | POA: Diagnosis not present

## 2016-03-15 DIAGNOSIS — Z9114 Patient's other noncompliance with medication regimen: Secondary | ICD-10-CM | POA: Insufficient documentation

## 2016-03-15 DIAGNOSIS — F418 Other specified anxiety disorders: Secondary | ICD-10-CM | POA: Diagnosis not present

## 2016-03-15 DIAGNOSIS — K259 Gastric ulcer, unspecified as acute or chronic, without hemorrhage or perforation: Secondary | ICD-10-CM | POA: Diagnosis not present

## 2016-03-15 DIAGNOSIS — K209 Esophagitis, unspecified: Secondary | ICD-10-CM | POA: Insufficient documentation

## 2016-03-15 DIAGNOSIS — I10 Essential (primary) hypertension: Secondary | ICD-10-CM | POA: Insufficient documentation

## 2016-03-15 DIAGNOSIS — Z7984 Long term (current) use of oral hypoglycemic drugs: Secondary | ICD-10-CM | POA: Insufficient documentation

## 2016-03-15 DIAGNOSIS — E871 Hypo-osmolality and hyponatremia: Secondary | ICD-10-CM | POA: Insufficient documentation

## 2016-03-15 DIAGNOSIS — K922 Gastrointestinal hemorrhage, unspecified: Secondary | ICD-10-CM

## 2016-03-15 DIAGNOSIS — F319 Bipolar disorder, unspecified: Secondary | ICD-10-CM | POA: Insufficient documentation

## 2016-03-15 DIAGNOSIS — G4733 Obstructive sleep apnea (adult) (pediatric): Secondary | ICD-10-CM | POA: Insufficient documentation

## 2016-03-15 DIAGNOSIS — M6281 Muscle weakness (generalized): Secondary | ICD-10-CM | POA: Diagnosis not present

## 2016-03-15 DIAGNOSIS — D649 Anemia, unspecified: Secondary | ICD-10-CM | POA: Insufficient documentation

## 2016-03-15 DIAGNOSIS — E1169 Type 2 diabetes mellitus with other specified complication: Secondary | ICD-10-CM | POA: Diagnosis present

## 2016-03-15 DIAGNOSIS — F3012 Manic episode without psychotic symptoms, moderate: Secondary | ICD-10-CM | POA: Diagnosis not present

## 2016-03-15 DIAGNOSIS — K219 Gastro-esophageal reflux disease without esophagitis: Secondary | ICD-10-CM | POA: Diagnosis not present

## 2016-03-15 DIAGNOSIS — K319 Disease of stomach and duodenum, unspecified: Secondary | ICD-10-CM | POA: Diagnosis not present

## 2016-03-15 DIAGNOSIS — K920 Hematemesis: Secondary | ICD-10-CM | POA: Diagnosis not present

## 2016-03-15 DIAGNOSIS — F3112 Bipolar disorder, current episode manic without psychotic features, moderate: Secondary | ICD-10-CM | POA: Diagnosis not present

## 2016-03-15 DIAGNOSIS — E669 Obesity, unspecified: Secondary | ICD-10-CM | POA: Insufficient documentation

## 2016-03-15 DIAGNOSIS — Z8719 Personal history of other diseases of the digestive system: Secondary | ICD-10-CM | POA: Diagnosis present

## 2016-03-15 DIAGNOSIS — G20A1 Parkinson's disease without dyskinesia, without mention of fluctuations: Secondary | ICD-10-CM | POA: Diagnosis present

## 2016-03-15 DIAGNOSIS — D62 Acute posthemorrhagic anemia: Secondary | ICD-10-CM | POA: Diagnosis not present

## 2016-03-15 DIAGNOSIS — R0602 Shortness of breath: Secondary | ICD-10-CM | POA: Diagnosis not present

## 2016-03-15 DIAGNOSIS — G20 Parkinson's disease: Secondary | ICD-10-CM | POA: Insufficient documentation

## 2016-03-15 HISTORY — DX: Parkinson's disease: G20

## 2016-03-15 HISTORY — DX: Parkinson's disease without dyskinesia, without mention of fluctuations: G20.A1

## 2016-03-15 LAB — CBC
HCT: 18.7 % — ABNORMAL LOW (ref 40.0–52.0)
HEMATOCRIT: 19.7 % — AB (ref 39.0–52.0)
HEMOGLOBIN: 6.4 g/dL — AB (ref 13.0–17.0)
Hemoglobin: 6.3 g/dL — ABNORMAL LOW (ref 13.0–18.0)
MCH: 30 pg (ref 26.0–34.0)
MCH: 28.2 pg (ref 26.0–34.0)
MCHC: 33.6 g/dL (ref 32.0–36.0)
MCHC: 32.5 g/dL (ref 30.0–36.0)
MCV: 89.4 fL (ref 80.0–100.0)
MCV: 86.8 fL (ref 78.0–100.0)
Platelets: 216 10*3/uL (ref 150–440)
Platelets: 233 10*3/uL (ref 150–400)
RBC: 2.09 MIL/uL — ABNORMAL LOW (ref 4.40–5.90)
RBC: 2.27 MIL/uL — ABNORMAL LOW (ref 4.22–5.81)
RDW: 13.9 % (ref 11.5–14.5)
RDW: 13.6 % (ref 11.5–15.5)
WBC: 9.3 10*3/uL (ref 3.8–10.6)
WBC: 9.2 10*3/uL (ref 4.0–10.5)

## 2016-03-15 LAB — COMPREHENSIVE METABOLIC PANEL
ALBUMIN: 3.6 g/dL (ref 3.5–5.0)
ALK PHOS: 42 U/L (ref 38–126)
ALT: 6 U/L — ABNORMAL LOW (ref 17–63)
ANION GAP: 12 (ref 5–15)
AST: 15 U/L (ref 15–41)
BUN: 27 mg/dL — ABNORMAL HIGH (ref 6–20)
CALCIUM: 7.8 mg/dL — AB (ref 8.9–10.3)
CO2: 19 mmol/L — AB (ref 22–32)
Chloride: 96 mmol/L — ABNORMAL LOW (ref 101–111)
Creatinine, Ser: 1.01 mg/dL (ref 0.61–1.24)
GFR calc non Af Amer: >60 mL/min (ref >60)
GLUCOSE: 494 mg/dL — AB (ref 65–99)
POTASSIUM: 4.2 mmol/L (ref 3.5–5.1)
SODIUM: 127 mmol/L — AB (ref 135–145)
TOTAL PROTEIN: 5.6 g/dL — AB (ref 6.5–8.1)
Total Bilirubin: 0.6 mg/dL (ref 0.3–1.2)

## 2016-03-15 LAB — PROTIME-INR
INR: 1.1
Prothrombin Time: 14.4 seconds (ref 11.4–15.0)

## 2016-03-15 LAB — CK: CK TOTAL: 119 U/L (ref 49–397)

## 2016-03-15 LAB — PREPARE RBC (CROSSMATCH)

## 2016-03-15 LAB — BASIC METABOLIC PANEL
ANION GAP: 6 (ref 5–15)
BUN: 18 mg/dL (ref 6–20)
CO2: 23 mmol/L (ref 22–32)
Calcium: 7.8 mg/dL — ABNORMAL LOW (ref 8.9–10.3)
Chloride: 102 mmol/L (ref 101–111)
Creatinine, Ser: 0.83 mg/dL (ref 0.61–1.24)
GFR calc Af Amer: >60 mL/min (ref >60)
Glucose, Bld: 308 mg/dL — ABNORMAL HIGH (ref 65–99)
POTASSIUM: 4.4 mmol/L (ref 3.5–5.1)
SODIUM: 131 mmol/L — AB (ref 135–145)

## 2016-03-15 LAB — GLUCOSE, CAPILLARY
GLUCOSE-CAPILLARY: 300 mg/dL — AB (ref 65–99)
GLUCOSE-CAPILLARY: 296 mg/dL — AB (ref 65–99)
GLUCOSE-CAPILLARY: 287 mg/dL — AB (ref 65–99)
Glucose-Capillary: 323 mg/dL — ABNORMAL HIGH (ref 65–99)

## 2016-03-15 LAB — ABO/RH: ABO/RH(D): A POS

## 2016-03-15 LAB — TROPONIN I: Troponin I: <0.03 ng/mL (ref <0.031)

## 2016-03-15 MED ORDER — INSULIN ASPART 100 UNIT/ML ~~LOC~~ SOLN
0.0000 [IU] | Freq: Every day | SUBCUTANEOUS | Status: DC
  Administered 2016-03-15: 4 [IU] via SUBCUTANEOUS

## 2016-03-15 MED ORDER — INSULIN ASPART 100 UNIT/ML ~~LOC~~ SOLN
8.0000 [IU] | Freq: Once | SUBCUTANEOUS | Status: AC
  Administered 2016-03-15: 8 [IU] via INTRAVENOUS
  Filled 2016-03-15: qty 8

## 2016-03-15 MED ORDER — SODIUM CHLORIDE 0.9 % IV SOLN
8.0000 mg/h | INTRAVENOUS | Status: DC
  Administered 2016-03-15: 8 mg/h via INTRAVENOUS
  Filled 2016-03-15: qty 80

## 2016-03-15 MED ORDER — SIMVASTATIN 40 MG PO TABS
40.0000 mg | ORAL_TABLET | Freq: Every day | ORAL | Status: DC
  Administered 2016-03-15 – 2016-03-16 (×2): 40 mg via ORAL
  Filled 2016-03-15 (×2): qty 1

## 2016-03-15 MED ORDER — SODIUM CHLORIDE 0.9 % IV SOLN
80.0000 mg | Freq: Once | INTRAVENOUS | Status: AC
  Administered 2016-03-15: 80 mg via INTRAVENOUS
  Filled 2016-03-15: qty 80

## 2016-03-15 MED ORDER — DULOXETINE HCL 60 MG PO CPEP
60.0000 mg | ORAL_CAPSULE | Freq: Every day | ORAL | Status: DC
  Administered 2016-03-15 – 2016-03-17 (×3): 60 mg via ORAL
  Filled 2016-03-15 (×3): qty 1

## 2016-03-15 MED ORDER — INSULIN ASPART 100 UNIT/ML ~~LOC~~ SOLN
0.0000 [IU] | Freq: Three times a day (TID) | SUBCUTANEOUS | Status: DC
  Administered 2016-03-15: 11 [IU] via SUBCUTANEOUS
  Administered 2016-03-15: 8 [IU] via SUBCUTANEOUS
  Administered 2016-03-16 (×2): 5 [IU] via SUBCUTANEOUS
  Administered 2016-03-16: 15 [IU] via SUBCUTANEOUS
  Administered 2016-03-17: 8 [IU] via SUBCUTANEOUS
  Administered 2016-03-17: 5 [IU] via SUBCUTANEOUS

## 2016-03-15 MED ORDER — SODIUM CHLORIDE 0.9 % IV SOLN
10.0000 mL/h | Freq: Once | INTRAVENOUS | Status: AC
Start: 2016-03-15 — End: 2016-03-15
  Administered 2016-03-15: 10 mL/h via INTRAVENOUS

## 2016-03-15 MED ORDER — SODIUM CHLORIDE 0.9 % IV BOLUS (SEPSIS)
1000.0000 mL | Freq: Once | INTRAVENOUS | Status: AC
  Administered 2016-03-15: 1000 mL via INTRAVENOUS

## 2016-03-15 MED ORDER — SODIUM CHLORIDE 0.9 % IV SOLN: INTRAVENOUS | Status: AC

## 2016-03-15 MED ORDER — ONDANSETRON HCL 4 MG/2ML IJ SOLN: 4.0000 mg | Freq: Four times a day (QID) | INTRAMUSCULAR | Status: DC | PRN

## 2016-03-15 MED ORDER — ACETAMINOPHEN 325 MG PO TABS: 650.0000 mg | ORAL_TABLET | Freq: Four times a day (QID) | ORAL | Status: DC | PRN

## 2016-03-15 MED ORDER — INSULIN DETEMIR 100 UNIT/ML ~~LOC~~ SOLN
10.0000 [IU] | Freq: Every day | SUBCUTANEOUS | Status: DC
  Administered 2016-03-15: 10 [IU] via SUBCUTANEOUS
  Filled 2016-03-15 (×2): qty 0.1

## 2016-03-15 MED ORDER — FLUTICASONE PROPIONATE 50 MCG/ACT NA SUSP
2.0000 | Freq: Every day | NASAL | Status: DC
  Administered 2016-03-15 – 2016-03-17 (×3): 2 via NASAL
  Filled 2016-03-15: qty 16

## 2016-03-15 MED ORDER — ONDANSETRON HCL 4 MG PO TABS: 4.0000 mg | ORAL_TABLET | Freq: Four times a day (QID) | ORAL | Status: DC | PRN

## 2016-03-15 MED ORDER — SODIUM CHLORIDE 0.9 % IV SOLN: Freq: Once | INTRAVENOUS | Status: DC

## 2016-03-15 MED ORDER — SODIUM CHLORIDE 0.9% FLUSH
3.0000 mL | Freq: Two times a day (BID) | INTRAVENOUS | Status: DC
  Administered 2016-03-15 – 2016-03-17 (×3): 3 mL via INTRAVENOUS

## 2016-03-15 MED ORDER — LAMOTRIGINE 200 MG PO TABS
200.0000 mg | ORAL_TABLET | Freq: Every day | ORAL | Status: DC
  Administered 2016-03-15 – 2016-03-16 (×2): 200 mg via ORAL
  Filled 2016-03-15 (×3): qty 1

## 2016-03-15 MED ORDER — PROPRANOLOL HCL ER 60 MG PO CP24
60.0000 mg | ORAL_CAPSULE | Freq: Every day | ORAL | Status: DC
  Administered 2016-03-15 – 2016-03-17 (×3): 60 mg via ORAL
  Filled 2016-03-15 (×3): qty 1

## 2016-03-15 MED ORDER — ACETAMINOPHEN 650 MG RE SUPP: 650.0000 mg | Freq: Four times a day (QID) | RECTAL | Status: DC | PRN

## 2016-03-15 MED ORDER — FAMOTIDINE IN NACL 20-0.9 MG/50ML-% IV SOLN
20.0000 mg | Freq: Two times a day (BID) | INTRAVENOUS | Status: DC
  Administered 2016-03-15 – 2016-03-16 (×2): 20 mg via INTRAVENOUS
  Filled 2016-03-15 (×3): qty 50

## 2016-03-15 MED ORDER — CARBIDOPA-LEVODOPA ER 50-200 MG PO TBCR
1.0000 | EXTENDED_RELEASE_TABLET | Freq: Three times a day (TID) | ORAL | Status: DC
  Administered 2016-03-15 – 2016-03-17 (×6): 1 via ORAL
  Filled 2016-03-15 (×8): qty 1

## 2016-03-15 NOTE — ED Notes (Signed)
[facility] here to transport patient.  

## 2016-03-15 NOTE — Progress Notes (Signed)
Transferred from [place] on the floor with blood bag already empty and normal saline going on.Alert and oriented x 4.Placed on telemetry.Skin assessed with Rodena Piety and Wells Guiles.Small cab over wound noted on the left shin 0.2 x.0.2. No drainage,no foul odor.Patient acknowledge fall protocol education given.Placed in bed comfortably.

## 2016-03-15 NOTE — H&P (Signed)
History and Physical    Bryan Flowers [id number] DOB: August 07, 1961 DOA: 03/15/2016  PCP: Ashok Norris, MD Patient coming from: home ([place])  Chief Complaint: generalized weakness/sob  HPI: Bryan Flowers is a 55 y.o. male with medical history significant for depression, diabetes, hyperlipidemia, hypertension, bipolar presented to the emergency department [place] with generalized weakness and fatigue. So evaluation reveals hemoglobin of 6.3 with tachycardia with a serum glucose of 494.Marland Kitchen Patient transferred to [place] as there is no gastroenterology coverage at [place] is obtained from the chart and the patient he reports for the last 4 days he has felt progressively weak and tired. 2 days ago he some shortness of breath and loose very dark stool. He denies abdominal pain but does endorse some nausea without vomiting as well as intermittent palpitations with exertion but denies chest pain. He reports taking occasional Tylenol but denies NSAID use and denies alcohol use. He reports decreased oral intake as well.  He reports occasional checking of his CBG. He states medication is oral agents. Unaware of his most recent hemoglobin A1c    ED Course: He is provided with 1 unit of packed red blood cells IV fluids  Review of Systems: As per HPI otherwise 10 point review of systems negative.   Ambulatory Status: Independent with steady gait  Past Medical History  Diagnosis Date  . Depression   . Diabetes mellitus without complication ([place])   . Hyperlipidemia   . Hypertension   . Bipolar 1 disorder, manic, moderate ([place])   . GERD (gastroesophageal reflux disease)   . Insomnia   . Parkinson disease [facility])     Past Surgical History  Procedure Laterality Date  . Foot fracture surgery Right     Social History   Social History  . Marital Status: Married    Spouse Name: N/A  . Number of Children: N/A  . Years of Education: N/A   Occupational History  .  Not on file.   Social History Main Topics  . Smoking status: Never Smoker   . Smokeless tobacco: Not on file  . Alcohol Use: No  . Drug Use: No  . Sexual Activity:    Partners: Female   Other Topics Concern  . Not on file   Social History Narrative    No Known Allergies  Family History  Problem Relation Age of Onset  . Cancer Mother   . Heart disease Father     Prior to Admission medications   Medication Sig Start Date End Date Taking? Authorizing Provider  carbidopa-levodopa (SINEMET CR) 50-200 MG per tablet Take 1 tablet by mouth 3 (three) times daily as needed.  03/08/15   Historical Provider, MD  DULoxetine (CYMBALTA) 60 MG capsule Take 60 mg by mouth daily.  03/15/15   Historical Provider, MD  Exenatide ER 2 MG PEN Inject 2 mg into the skin once a week. 03/12/16   Roselee Nova, MD  fluticasone (FLONASE) 50 MCG/ACT nasal spray Place 2 sprays into both nostrils daily. 02/27/16   Roselee Nova, MD  gemfibrozil (LOPID) 600 MG tablet Take 1 tablet (600 mg total) by mouth 2 (two) times daily before a meal. 08/19/15   Ashok Norris, MD  glyBURIDE-metformin (GLUCOVANCE) 2.5-500 MG tablet TAKE 2 TABLETS BY MOUTH TWICE DAILY WITH MEALS 01/23/16   Ashok Norris, MD  lamoTRIgine (LAMICTAL) 200 MG tablet Take 200 mg by mouth at bedtime.  02/24/15   Historical Provider, MD  LEVEMIR FLEXTOUCH 100 UNIT/ML  Pen Inject 10 Units into the skin daily.  04/09/15   Historical Provider, MD  LORazepam (ATIVAN) 0.5 MG tablet Take 0.5 mg by mouth 3 (three) times daily as needed.  03/13/15   Historical Provider, MD  propranolol ER (INDERAL LA) 60 MG 24 hr capsule Take 60 mg by mouth daily.  02/24/15   Historical Provider, MD  QUEtiapine (SEROQUEL) 50 MG tablet Take 50 mg by mouth.    Historical Provider, MD  quinapril-hydrochlorothiazide (ACCURETIC) 20-12.5 MG tablet Take 2 tablets by mouth daily. 03/12/16   Roselee Nova, MD  rOPINIRole (REQUIP) 1 MG tablet TK 1 T PO JUST BEFORE BEDTIME 02/12/16    Historical Provider, MD  simvastatin (ZOCOR) 40 MG tablet Take 1 tablet (40 mg total) by mouth at bedtime. 03/12/16   Roselee Nova, MD    Physical Exam: Filed Vitals:   03/15/16 1420  BP: 138/59  Pulse: 89  Temp: 97.8 F (36.6 C)  TempSrc: Oral  Resp: 18  SpO2: 100%     General:  Appears calm Somewhat flat a fact slightly pale but comfortable Eyes:  PERRL, EOMI, normal lids, iris ENT:  grossly normal hearing, lips & tongue, because membranes of his mouth are somewhat pale slightly dry Neck:  no LAD, masses or thyromegaly Cardiovascular:  RRR, no m/r/g. No LE edema.  Respiratory:  CTA bilaterally, no w/r/r. Normal respiratory effort. Abdomen:  soft, ntnd, NABS no guarding or rebounding Skin:  no rash or induration seen on limited exam Musculoskeletal:  grossly normal tone BUE/BLE, good ROM, no bony abnormality Psychiatric:  grossly normal mood and affect, speech fluent and appropriate, AOx3 Neurologic:  CN 2-12 grossly intact, moves all extremities in coordinated fashion, sensation intact  Labs on Admission: I have personally reviewed following labs and imaging studies  CBC:  Recent Labs Lab 03/15/16 0908  WBC 9.3  HGB 6.3*  HCT 18.7*  MCV 89.4  PLT 123XX123   Basic Metabolic Panel:  Recent Labs Lab 03/12/16 1008 03/15/16 0912  NA 140 127*  K 5.1 4.2  CL 100 96*  CO2 24 19*  GLUCOSE 263* 494*  BUN 22 27*  CREATININE 0.91 1.01  CALCIUM 9.3 7.8*   GFR: Estimated Creatinine Clearance: 104.5 mL/min (by C-G formula based on Cr of 1.01). Liver Function Tests:  Recent Labs Lab 03/12/16 1008 03/15/16 0912  AST 12 15  ALT 13 6*  ALKPHOS 59 42  BILITOT <0.2 0.6  PROT 6.4 5.6*  ALBUMIN 4.3 3.6   No results for input(s): LIPASE, AMYLASE in the last 168 hours. No results for input(s): AMMONIA in the last 168 hours. Coagulation Profile:  Recent Labs Lab 03/15/16 0912  INR 1.10   Cardiac Enzymes:  Recent Labs Lab 03/15/16 0912  CKTOTAL 119    TROPONINI <0.03   BNP (last 3 results) No results for input(s): PROBNP in the last 8760 hours. HbA1C: No results for input(s): HGBA1C in the last 72 hours. CBG:  Recent Labs Lab 03/15/16 1308 03/15/16 1415  GLUCAP 300* 296*   Lipid Profile: No results for input(s): CHOL, HDL, LDLCALC, TRIG, CHOLHDL, LDLDIRECT in the last 72 hours. Thyroid Function Tests: No results for input(s): TSH, T4TOTAL, FREET4, T3FREE, THYROIDAB in the last 72 hours. Anemia Panel: No results for input(s): VITAMINB12, FOLATE, FERRITIN, TIBC, IRON, RETICCTPCT in the last 72 hours. Urine analysis: No results found for: COLORURINE, APPEARANCEUR, LABSPEC, PHURINE, GLUCOSEU, HGBUR, BILIRUBINUR, KETONESUR, PROTEINUR, UROBILINOGEN, NITRITE, LEUKOCYTESUR  Creatinine Clearance: Estimated Creatinine Clearance: 104.5 mL/min (  by C-G formula based on Cr of 1.01).  Sepsis Labs: @LABRCNTIP (procalcitonin:4,lacticidven:4) )No results found for this or any previous visit (from the past 240 hour(s)).   Radiological Exams on Admission: No results found.  EKG:   Assessment/Plan Principal Problem:   GI bleed Active Problems:   Uncontrolled type 2 diabetes mellitus (HCC)   Bipolar I disorder, single manic episode, moderate (HCC)   Obstructive sleep apnea   HLD (hyperlipidemia)   Fibromyalgia   Anemia   Hyponatremia   Parkinson disease (HCC)   Acute blood loss anemia   Hypertension   #1. GI bleed. Hemoglobin 6.3. BUN 27. No NSAID use no EtOH abuse never had a colonoscopy or an endoscopy. Reports melena for the last 4 days. He's received 1 unit of packed red blood cells. Stable vital signs on admission -Admit to telemetry -Nothing by mouth except ice chips with meds -Protonix -GI consult  #2. Acute blood loss anemia. Likely related to #1. Hemoglobin 6.3 on admission. He received 1 unit of packed red blood cells during transport. -Obtain CBC on admission -Provide 2 more units of packed red blood cells -Oxygen  supplementation as indicated -Serial CBC  #3. Uncontrolled type 2 diabetes. Serum glucose 492 prior to transport. Home medications include oral agents and Levemir. Patient reports some noncompliance with medications. -Stat bemet -Bolus of normal saline -Resume home Levemir -Hold oral agents -Sliding scale insulin for optimal control -Obtain a hemoglobin A1c  4. Hyponatremia. Sodium 127 on admission. Likely related to #3. Expect this to resolve with IV fluids and improved serum glucose control -Gentle IV fluids -Recheck in the morning  #5. Parkinson's. Appears to be stable at baseline. Patient reports steady gait no recent falls no dysphasia -Continue Sinemet  6. Bipolar disorder. Home medications include Lamictal, Ativan, Seroquel -Continue home meds  7. Hypertension. Fair control. Home medications include hydrochlorothiazide, propanolol quinapril -We will hold these for now -Monitor blood pressure closely -Likely resume beta blocker when indicated   DVT prophylaxis: scd Code Status: full Family Communication: wife at bedside  Disposition Plan: home   Consults called: dr Collene Mares  Admission status: obs    Radene Gunning MD [facility]  If 7PM-7AM, please contact night-coverage [email or web address] Password [email or web address]  03/15/2016, 3:35 PM

## 2016-03-15 NOTE — Progress Notes (Signed)
Patient being transferred to [facility] for acute GI bleed.  Hgb 6.3.  No colonoscopy.   Never seen by GI.  Getting 1 unit PRBC at [facility] (ordered but not given yet).  Tele bed requested Eulogio Bear DO

## 2016-03-15 NOTE — ED Provider Notes (Signed)
[facility] Emergency Department Provider Note  Time seen: 9:22 AM  I have reviewed the triage vital signs and the nursing notes.   HISTORY  Chief Complaint Fatigue and Muscle Pain    HPI Bryan Flowers is a 55 y.o. male with a past medical history of depression, diabetes, hyperlipidemia, hypertension, bipolar, presents the emergency department with generalized weakness and fatigue.According to the patient for the past 4 days since Thursday he has been feeling weak and tired. He states he brought his car to a Dealer on Thursday and he was in a very hot workshop for several hours which he believes started his symptoms. On review of systems however he notes over the past 3 days he has been having loose stool that is very dark in color. Denies any vomiting but states occasional nausea. Occasionally it will get sweaty. States especially upon standing he feels his heart rate increased substantially and will occasionally get palpitations and a feeling of chest tightness with exertion. Patient is pale-appearing, verified by the wife. Patient also notes shortness of breath with exertion as well. Denies any shortness breath or chest pain presently. Denies abdominal pain.     Past Medical History  Diagnosis Date  . Depression   . Diabetes mellitus without complication ([place])   . Hyperlipidemia   . Hypertension   . Bipolar 1 disorder, manic, moderate ([place])   . GERD (gastroesophageal reflux disease)   . Insomnia     Patient Active Problem List   Diagnosis Date Noted  . Subacute maxillary sinusitis 02/27/2016  . Anxiety 08/19/2015  . Type 2 diabetes mellitus ([place]) 08/19/2015  . Abnormal ECG 04/09/2015  . Back ache 04/09/2015  . Clinical depression 04/09/2015  . Uncontrolled type 2 diabetes mellitus ([place]) 04/09/2015  . Obstructive sleep apnea 04/09/2015  . HDL deficiency 04/09/2015  . Hearing loss, sensorineural, combined types 04/09/2015  . HLD (hyperlipidemia)  04/09/2015  . BP (high blood pressure) 04/09/2015  . Idiopathic Parkinson's disease ([place]) 04/09/2015  . Fibrositis 03/15/2014  . Fibromyalgia 03/15/2014  . Bipolar I disorder, single manic episode, moderate ([place]) 03/17/2010  . Testicular hypofunction 05/17/2009  . Migraine with aura 05/14/2009  . Anxiety state 03/19/2008  . Cannot sleep 03/05/2008  . Reflux esophagitis 07/12/2007    Past Surgical History  Procedure Laterality Date  . Foot fracture surgery Right     Current Outpatient Rx  Name  Route  Sig  Dispense  Refill  . carbidopa-levodopa (SINEMET CR) 50-200 MG per tablet   Oral   Take 1 tablet by mouth 3 (three) times daily as needed.          . DULoxetine (CYMBALTA) 60 MG capsule   Oral   Take 60 mg by mouth daily.          . Exenatide ER 2 MG PEN   Subcutaneous   Inject 2 mg into the skin once a week.   1 each   11   . fluticasone (FLONASE) 50 MCG/ACT nasal spray   Each Nare   Place 2 sprays into both nostrils daily.   16 g   0   . gemfibrozil (LOPID) 600 MG tablet   Oral   Take 1 tablet (600 mg total) by mouth 2 (two) times daily before a meal.   60 tablet   5   . glyBURIDE-metformin (GLUCOVANCE) 2.5-500 MG tablet      TAKE 2 TABLETS BY MOUTH TWICE DAILY WITH MEALS   120 tablet  0   . lamoTRIgine (LAMICTAL) 200 MG tablet   Oral   Take 200 mg by mouth at bedtime.          Marland Kitchen LEVEMIR FLEXTOUCH 100 UNIT/ML Pen   Subcutaneous   Inject 10 Units into the skin daily.            Dispense as written.   Marland Kitchen LORazepam (ATIVAN) 0.5 MG tablet   Oral   Take 0.5 mg by mouth 3 (three) times daily as needed.          . propranolol ER (INDERAL LA) 60 MG 24 hr capsule   Oral   Take 60 mg by mouth daily.          . QUEtiapine (SEROQUEL) 50 MG tablet   Oral   Take 50 mg by mouth.         . quinapril-hydrochlorothiazide (ACCURETIC) 20-12.5 MG tablet   Oral   Take 2 tablets by mouth daily.   180 tablet   0     Dispense as written.   Marland Kitchen  rOPINIRole (REQUIP) 1 MG tablet      TK 1 T PO JUST BEFORE BEDTIME      5   . simvastatin (ZOCOR) 40 MG tablet   Oral   Take 1 tablet (40 mg total) by mouth at bedtime.   90 tablet   1     Allergies Review of patient's allergies indicates no known allergies.  Family History  Problem Relation Age of Onset  . Cancer Mother   . Heart disease Father     Social History Social History  Substance Use Topics  . Smoking status: Never Smoker   . Smokeless tobacco: None  . Alcohol Use: No    Review of Systems Constitutional: Negative for fever. Cardiovascular: Negative for chest pain. Respiratory: Negative for shortness of breath. Gastrointestinal: Negative for abdominal pain. Positive for loose dark stool. Negative for vomiting. Positive for nausea. Genitourinary: Negative for dysuria. Neurological: Negative for headache 10-point ROS otherwise negative.  ____________________________________________   PHYSICAL EXAM:  VITAL SIGNS: ED Triage Vitals  Enc Vitals Group     BP 03/15/16 0900 130/67 mmHg     Pulse Rate 03/15/16 0900 127     Resp 03/15/16 0900 18     Temp 03/15/16 0900 98.8 F (37.1 C)     Temp Source 03/15/16 0900 Oral     SpO2 03/15/16 0900 99 %     Weight 03/15/16 0900 238 lb (107.956 kg)     Height 03/15/16 0900 5\' 11"  (1.803 m)     Head Cir --      Peak Flow --      Pain Score 03/15/16 0901 6     Pain Loc --      Pain Edu? --      Excl. in [place]? --     Constitutional: Alert and oriented. Well appearing and in no distress. Eyes: Normal exam ENT   Head: Normocephalic and atraumatic.   Mouth/Throat: Mucous membranes are moist.Somewhat pale mucous membranes. Cardiovascular: Normal rate, regular rhythm. No murmur Respiratory: Normal respiratory effort without tachypnea nor retractions. Breath sounds are clear  Gastrointestinal: Soft, mild lower abdominal tenderness to palpation without rebound or guarding. No distention Musculoskeletal:  Nontender with normal range of motion in all extremities.  Neurologic:  Normal speech and language. No gross focal neurologic deficits Skin:  Skin is warm, dry and intact. Pale in color. Psychiatric: Mood and affect are normal.   ____________________________________________  EKG  EKG reviewed and interpreted by myself shows sinus tachycardia 125 bpm, narrow QRS, normal axis, normal intervals, nonspecific but no concerning ST changes.  ____________________________________________    INITIAL IMPRESSION / ASSESSMENT AND PLAN / ED COURSE  Pertinent labs & imaging results that were available during my care of the patient were reviewed by me and considered in my medical decision making (see chart for details).  Patient presents the emergency department generalized weakness, occasional chest pain/palpitations/dyspnea with exertion. On exam patient is pale in appearance, wife also states patient looks pale. Patient is tachycardic around 120 bpm.  Labs are consistent with significant anemia hemoglobin of 6.3. I discussed transfusion with the patient, he is agreeable and has consented. Continue to await the rest of lab results, patient will require admission to the hospital for transfusion, and GI workup.   Patient's labs a hemoglobin of 6.3, otherwise are largely within normal limits. Patient remains tachycardic receiving IV fluids. We do not have GI medicine on call this weekend. We'll attempt transfer to [facility] facility for further workup.  I discussed the patient Moses, and GI medicine as well as hospitalist, they've accepted to their service under Dr. Eliseo Squires. Patient remained stable, heart rate has decreased to 100 bpm with IV fluids. Currently awaiting blood products.   CRITICAL CARE Performed by: Harvest Dark   Total critical care time: 40 minutes  Critical care time was exclusive of separately billable procedures and treating other patients.  Critical care was necessary  to treat or prevent imminent or life-threatening deterioration.  Critical care was time spent personally by me on the following activities: development of treatment plan with patient and/or surrogate as well as nursing, discussions with consultants, evaluation of patient's response to treatment, examination of patient, obtaining history from patient or surrogate, ordering and performing treatments and interventions, ordering and review of laboratory studies, ordering and review of radiographic studies, pulse oximetry and re-evaluation of patient's condition.   ____________________________________________   FINAL CLINICAL IMPRESSION(S) / ED DIAGNOSES  GI bleed Anemia   Harvest Dark, MD 03/15/16 1142

## 2016-03-15 NOTE — ED Notes (Signed)
Patient presents to the ED with fatigue/weakness and feeling like he was going to pass out since Thursday.  Patient states he went to the mechanic's shop on Thursday and there wasn't air conditioning and patient got very hot and sweaty and felt poorly.  Patient states he hasn't been able to get back to normal.  Patient appears very pale in triage.  Patient was unsteady walking to triage desk, appeared that he might fall.  Patient speaking in full sentences.  Patient is hard of hearing and forgot hearing aids today.  Patient reports occasional chest pain and shortness of breath the past couple of days, denies at this time.

## 2016-03-15 NOTE — ED Notes (Signed)
Patient denies any adverse reactions at this time. Patient is A&O x4. NSR on monitor. Vital signs are at baseline. Patient is afebrile. Patient educated on signs of adverse reaction to blood. Verbalizes understand to use call bell if he experiences any new or worsening symptoms.

## 2016-03-15 NOTE — ED Notes (Signed)
MD Paduchowski at bedside  

## 2016-03-15 NOTE — Consult Note (Signed)
CROSS COVER [facility] Reason for Consult: Black stools x 3 days. Referring Physician: ER MD-[facility].  Bryan Flowers is an 55 y.o. male.  HPI:  Bryan Flowers is a 55 year old white male with multiple medical problems listed below. He was in his usual state of health until about 3 days ago when he started noticing melenic stools. He denies the use or abuse of nonsteroidals there is no history of associated bright red bleeding per rectum he's had some epigastric pain and nausea without vomiting. Since his symptoms started 3 days days ago he's having shortness of breath with exertion and dizziness with generalized malaise and fatigue. He denies experiencing any syncope or near syncope. He has never had a GI workup in the past and has not had a baseline colonoscopy either. His wife who at bedside is helping with his history. He denies a previous history of ulcers, jaundice or colitis. He has a good appetite and his weight has been stable. He denies having any dysphagia or odynophagia. According to the hospital notes he's had reflux esophagitis in the past.    Past Medical History  Diagnosis Date  . Depression   . Diabetes mellitus without complication ([place])   . Hyperlipidemia   . Hypertension   . Bipolar 1 disorder, manic, moderate ([place])   . GERD (gastroesophageal reflux disease)   . Insomnia   . Parkinson disease [facility])    Past Surgical History  Procedure Laterality Date  . Foot fracture surgery Right    Family History  Problem Relation Age of Onset  . Cancer Mother   . Heart disease Father    Social History:  reports that he has never smoked. He does not have any smokeless tobacco history on file. He reports that he does not drink alcohol or use illicit drugs.  Allergies: No Known Allergies  Medications: I have reviewed the patient's current medications.  Results for orders placed or performed during the hospital encounter of 03/15/16 (from the past 48 hour(s))   Glucose, capillary     Status: Abnormal   Collection Time: 03/15/16  2:15 PM  Result Value Ref Range   Glucose-Capillary 296 (H) 65 - 99 mg/dL   Review of Systems  Constitutional: Positive for malaise/fatigue and diaphoresis. Negative for fever, chills and weight loss.  Eyes: Negative.   Respiratory: Negative.   Cardiovascular: Negative.   Gastrointestinal: Positive for heartburn, abdominal pain, blood in stool and melena. Negative for nausea and vomiting.  Musculoskeletal: Positive for myalgias, back pain, joint pain and neck pain.  Skin: Negative.   Neurological: Positive for weakness.  Endo/Heme/Allergies: Negative.   Psychiatric/Behavioral: Positive for depression and memory loss. The patient is nervous/anxious and has insomnia.    Blood pressure 138/59, pulse 89, temperature 97.8 F (36.6 C), temperature source Oral, resp. rate 18, SpO2 100 %. Physical Exam  Constitutional: He is oriented to person, place, and time. He appears well-developed and well-nourished.  Obese, pale appearing  HENT:  Head: Normocephalic and atraumatic.  Eyes: Conjunctivae and EOM are normal. Pupils are equal, round, and reactive to light.  Neck: Normal range of motion.  Cardiovascular: Normal rate and regular rhythm.   Respiratory: Effort normal and breath sounds normal.  GI: Soft. Bowel sounds are normal.  Musculoskeletal: Normal range of motion.  Neurological: He is alert and oriented to person, place, and time.  Skin: Skin is warm and dry.  Psychiatric: He has a normal mood and affect. His behavior is normal. Judgment and thought  content normal.   Assessment/Plan: 1) Melenic stools x 3 days with anemia-will transfuse another unit of PRBC's today and keep the patient nothing by mouth for an EGD tomorrow. Patient is due for baseline colon cancer screening however if the EGD is normal a colonoscopy should be done prior to discharge. Patient has a history of GERD/reflux esophagitis diagnosed in the  past.  2) Uncontrolled diabetes.  3) Hypertension and hyperlipidemia.  4) History of bipolar disorder.  5) Parkinson's disease. 6) Obesity/OSA. 7) On disability for fibromyalgia.  Anysa Tacey 03/15/2016, 3:50 PM

## 2016-03-16 ENCOUNTER — Observation Stay (HOSPITAL_COMMUNITY): Payer: PPO | Admitting: Anesthesiology

## 2016-03-16 ENCOUNTER — Encounter (HOSPITAL_COMMUNITY)
Admission: AD | Disposition: A | Discharge status: Home / Self Care | Payer: Self-pay | Source: Other Acute Inpatient Hospital | Attending: Internal Medicine

## 2016-03-16 DIAGNOSIS — K259 Gastric ulcer, unspecified as acute or chronic, without hemorrhage or perforation: Secondary | ICD-10-CM | POA: Insufficient documentation

## 2016-03-16 DIAGNOSIS — K920 Hematemesis: Secondary | ICD-10-CM | POA: Diagnosis not present

## 2016-03-16 DIAGNOSIS — K319 Disease of stomach and duodenum, unspecified: Secondary | ICD-10-CM | POA: Diagnosis not present

## 2016-03-16 DIAGNOSIS — K921 Melena: Secondary | ICD-10-CM | POA: Diagnosis not present

## 2016-03-16 DIAGNOSIS — G20 Parkinson's disease: Secondary | ICD-10-CM | POA: Diagnosis not present

## 2016-03-16 DIAGNOSIS — K269 Duodenal ulcer, unspecified as acute or chronic, without hemorrhage or perforation: Secondary | ICD-10-CM | POA: Diagnosis not present

## 2016-03-16 DIAGNOSIS — D62 Acute posthemorrhagic anemia: Secondary | ICD-10-CM

## 2016-03-16 DIAGNOSIS — I10 Essential (primary) hypertension: Secondary | ICD-10-CM | POA: Diagnosis not present

## 2016-03-16 DIAGNOSIS — K264 Chronic or unspecified duodenal ulcer with hemorrhage: Secondary | ICD-10-CM | POA: Diagnosis not present

## 2016-03-16 DIAGNOSIS — E119 Type 2 diabetes mellitus without complications: Secondary | ICD-10-CM | POA: Diagnosis not present

## 2016-03-16 DIAGNOSIS — F3012 Manic episode without psychotic symptoms, moderate: Secondary | ICD-10-CM

## 2016-03-16 DIAGNOSIS — M797 Fibromyalgia: Secondary | ICD-10-CM | POA: Diagnosis not present

## 2016-03-16 DIAGNOSIS — D649 Anemia, unspecified: Secondary | ICD-10-CM | POA: Diagnosis not present

## 2016-03-16 HISTORY — PX: ESOPHAGOGASTRODUODENOSCOPY: SHX5428

## 2016-03-16 LAB — CBC
HEMATOCRIT: 24.2 % — AB (ref 39.0–52.0)
HEMOGLOBIN: 8.1 g/dL — AB (ref 13.0–17.0)
MCH: 29.8 pg (ref 26.0–34.0)
MCHC: 33.5 g/dL (ref 30.0–36.0)
MCV: 89 fL (ref 78.0–100.0)
Platelets: 180 10*3/uL (ref 150–400)
RBC: 2.72 MIL/uL — AB (ref 4.22–5.81)
RDW: 14.6 % (ref 11.5–15.5)
WBC: 8.2 10*3/uL (ref 4.0–10.5)

## 2016-03-16 LAB — TYPE AND SCREEN
ABO/RH(D): A POS
ANTIBODY SCREEN: NEGATIVE
UNIT DIVISION: 0

## 2016-03-16 LAB — GLUCOSE, CAPILLARY
GLUCOSE-CAPILLARY: 223 mg/dL — AB (ref 65–99)
GLUCOSE-CAPILLARY: 356 mg/dL — AB (ref 65–99)
GLUCOSE-CAPILLARY: 160 mg/dL — AB (ref 65–99)
Glucose-Capillary: 219 mg/dL — ABNORMAL HIGH (ref 65–99)

## 2016-03-16 LAB — BASIC METABOLIC PANEL
ANION GAP: 6 (ref 5–15)
BUN: 11 mg/dL (ref 6–20)
CO2: 25 mmol/L (ref 22–32)
Calcium: 7.9 mg/dL — ABNORMAL LOW (ref 8.9–10.3)
Chloride: 105 mmol/L (ref 101–111)
Creatinine, Ser: 0.77 mg/dL (ref 0.61–1.24)
GFR calc Af Amer: >60 mL/min (ref >60)
GLUCOSE: 232 mg/dL — AB (ref 65–99)
POTASSIUM: 4.1 mmol/L (ref 3.5–5.1)
Sodium: 136 mmol/L (ref 135–145)

## 2016-03-16 LAB — HEMOGLOBIN A1C
Hgb A1c MFr Bld: 8 % — ABNORMAL HIGH (ref 4.8–5.6)
MEAN PLASMA GLUCOSE: 183 mg/dL

## 2016-03-16 LAB — ABO/RH: ABO/RH(D): A POS

## 2016-03-16 SURGERY — EGD (ESOPHAGOGASTRODUODENOSCOPY)
Anesthesia: Monitor Anesthesia Care | Laterality: Left

## 2016-03-16 MED ORDER — BUTAMBEN-TETRACAINE-BENZOCAINE 2-2-14 % EX AERO
INHALATION_SPRAY | CUTANEOUS | Status: DC | PRN
  Administered 2016-03-16: 2 via TOPICAL

## 2016-03-16 MED ORDER — PROPOFOL 10 MG/ML IV BOLUS
INTRAVENOUS | Status: DC | PRN
Start: 2016-03-16 — End: 2016-03-16
  Administered 2016-03-16: 40 mg via INTRAVENOUS

## 2016-03-16 MED ORDER — INSULIN DETEMIR 100 UNIT/ML ~~LOC~~ SOLN
18.0000 [IU] | Freq: Every day | SUBCUTANEOUS | Status: DC
  Administered 2016-03-16: 18 [IU] via SUBCUTANEOUS
  Filled 2016-03-16 (×3): qty 0.18

## 2016-03-16 MED ORDER — LACTATED RINGERS IV SOLN
INTRAVENOUS | Status: DC
  Administered 2016-03-16: 1000 mL via INTRAVENOUS

## 2016-03-16 MED ORDER — PROPOFOL 500 MG/50ML IV EMUL
INTRAVENOUS | Status: DC | PRN
  Administered 2016-03-16: 125 ug/kg/min via INTRAVENOUS

## 2016-03-16 MED ORDER — SODIUM CHLORIDE 0.9 % IV SOLN
INTRAVENOUS | Status: DC
  Administered 2016-03-16: 06:00:00 via INTRAVENOUS

## 2016-03-16 MED ORDER — INSULIN ASPART 100 UNIT/ML ~~LOC~~ SOLN
3.0000 [IU] | Freq: Three times a day (TID) | SUBCUTANEOUS | Status: DC
  Administered 2016-03-16 – 2016-03-17 (×3): 3 [IU] via SUBCUTANEOUS

## 2016-03-16 MED ORDER — PANTOPRAZOLE SODIUM 40 MG PO TBEC
40.0000 mg | DELAYED_RELEASE_TABLET | Freq: Two times a day (BID) | ORAL | Status: DC
  Administered 2016-03-16 – 2016-03-17 (×3): 40 mg via ORAL
  Filled 2016-03-16 (×3): qty 1

## 2016-03-16 NOTE — Transfer of Care (Signed)
Immediate Anesthesia Transfer of Care Note  Patient: Bryan Flowers  Procedure(s) Performed: Procedure(s): ESOPHAGOGASTRODUODENOSCOPY (EGD) (Left)  Patient Location: PACU  Anesthesia Type:MAC  Level of Consciousness: awake and alert   Airway & Oxygen Therapy: Patient Spontanous Breathing  Post-op Assessment: Report given to RN  Post vital signs: Reviewed  Last Vitals:  Filed Vitals:   03/16/16 1020 03/16/16 1027  BP: 114/64   Pulse: 79 81  Temp:    Resp: 15 21    Last Pain:  Filed Vitals:   03/16/16 1027  PainSc: 4       Patients Stated Pain Goal: 1 ([id number])  Complications: No apparent anesthesia complications

## 2016-03-16 NOTE — Anesthesia Preprocedure Evaluation (Signed)
Anesthesia Evaluation  Patient identified by MRN, date of birth, ID band Patient awake    Reviewed: Allergy & Precautions, NPO status , Patient's Chart, lab work & pertinent test results  Airway Mallampati: II  TM Distance: >3 FB Neck ROM: Full    Dental   Pulmonary sleep apnea ,    breath sounds clear to auscultation       Cardiovascular hypertension, Pt. on medications and Pt. on home beta blockers  Rhythm:Regular Rate:Normal     Neuro/Psych  Headaches, Anxiety Depression Bipolar Disorder Parkinsons disease  Neuromuscular disease    GI/Hepatic Neg liver ROS, GERD  ,  Endo/Other  diabetes, Type 2, Oral Hypoglycemic Agents  Renal/GU negative Renal ROS     Musculoskeletal  (+) Fibromyalgia -  Abdominal   Peds  Hematology  (+) anemia ,   Anesthesia Other Findings   Reproductive/Obstetrics                             Anesthesia Physical Anesthesia Plan  ASA: III  Anesthesia Plan: MAC   Post-op Pain Management:    Induction: Intravenous  Airway Management Planned: Nasal Cannula and Natural Airway  Additional Equipment:   Intra-op Plan:   Post-operative Plan:   Informed Consent: I have reviewed the patients History and Physical, chart, labs and discussed the procedure including the risks, benefits and alternatives for the proposed anesthesia with the patient or authorized representative who has indicated his/her understanding and acceptance.     Plan Discussed with: CRNA  Anesthesia Plan Comments:         Anesthesia Quick Evaluation

## 2016-03-16 NOTE — Progress Notes (Signed)
PROGRESS NOTE  Bryan Flowers [id number] DOB: Dec 31, 1960 DOA: 03/15/2016 PCP: Ashok Norris, MD  Brief History:  55 year old male with a history of depression, diabetes mellitus type 2, hyperlipidemia, hypertension, bipolar disorder presented to the emergency department at [facility] with 4 day history of generalized weakness and melanotic stools. The patient also began complaining of shortness of breath with associated nausea for 2 days prior to admission. The patient denied any NSAIDs or alcohol use. Upon presentation, the patient was noted to have serum glucose 494, but did not have an anion gap. Gastroenterology was consulted to assist with management. The patient underwent EGD on 03/16/2016 which revealed grade a esophagitis and nonbleeding duodenal ulcer. The patient was transfused with 3 units PRBC since admission.  Assessment/Plan: Upper GI bleed/melanotic stools/acute blood loss anemia -Based on hemoglobin unclear -Presented with hemoglobin 6.4 -Hemoglobin improved to 8.1 after 3 units PRBC -Appreciate GI consult -03/16/2016 EGD--grade a esophagitis, one nonbleeding duodenal ulcer -It is suspected that the patient's duodenal ulcer/gastric ulcer may have been the likely source of bleed -Advance diet -Trend hemoglobin--> CBC in a.m. -Continue Protonix 40 mg twice a day, d/c pepcid  Diabetes mellitus type 2, poorly controlled -03/15/2016 hemoglobin A1c--8.0 -increase levemir to 18 units q hs -add premeal insulin 3 units tiw -Hold oral agents during hospitalization -NovoLog sliding scale  PseudoHyponatremia -Secondary to hyperglycemia  Hypertension -Accuretic on hold during hospitalization -BP remains controlled  Hyperlipidemia -Continue Zocor  Bipolar disorder -Continue Cymbalta, Lamictal  Parkinson's disease -continue sinemet -continue propranolol   Disposition Plan:   Home 03/17/16 if stable Family Communication: no  Family at  beside  Consultants:  [facility]  Code Status: FULL     Subjective: Still feels malaise and fatigue but somewhat improved. Denies any fevers, chills, chest pain, shortness breath, nausea, vomiting, diarrhea, abdominal pain. No dysuria or hematuria. No headache or rashes  Objective: Filed Vitals:   03/16/16 1012 03/16/16 1013 03/16/16 1020 03/16/16 1027  BP: 94/70  114/64   Pulse: 79 79 79 81  Temp:      TempSrc:      Resp: 23 19 15 21   Weight:      SpO2: 98% 96% 95% 96%    Intake/Output Summary (Last 24 hours) at 03/16/16 1500 Last data filed at 03/16/16 1445  Gross per 24 hour  Intake   1308 ml  Output      0 ml  Net   1308 ml   Weight change:  Exam:   General:  Pt is alert, follows commands appropriately, not in acute distress  HEENT: No icterus, No thrush, No neck mass, [place]/AT  Cardiovascular: RRR, S1/S2, no rubs, no gallops  Respiratory: CTA bilaterally, no wheezing, no crackles, no rhonchi  Abdomen: Soft/+BS, non tender, non distended, no guarding  Extremities: No edema, No lymphangitis, No petechiae, No rashes, no synovitis   Data Reviewed: I have personally reviewed following labs and imaging studies Basic Metabolic Panel:  Recent Labs Lab 03/12/16 1008 03/15/16 0912 03/15/16 1541 03/16/16 0742  NA 140 127* 131* 136  K 5.1 4.2 4.4 4.1  CL 100 96* 102 105  CO2 24 19* 23 25  GLUCOSE 263* 494* 308* 232*  BUN 22 27* 18 11  CREATININE 0.91 1.01 0.83 0.77  CALCIUM 9.3 7.8* 7.8* 7.9*   Liver Function Tests:  Recent Labs Lab 03/12/16 1008 03/15/16 0912  AST 12 15  ALT 13 6*  ALKPHOS 59 42  BILITOT <0.2 0.6  PROT 6.4 5.6*  ALBUMIN 4.3 3.6   No results for input(s): LIPASE, AMYLASE in the last 168 hours. No results for input(s): AMMONIA in the last 168 hours. Coagulation Profile:  Recent Labs Lab 03/15/16 0912  INR 1.10   CBC:  Recent Labs Lab 03/15/16 0908 03/15/16 1541 03/16/16 0742  WBC 9.3 9.2 8.2  HGB 6.3* 6.4* 8.1*   HCT 18.7* 19.7* 24.2*  MCV 89.4 86.8 89.0  PLT 216 233 180   Cardiac Enzymes:  Recent Labs Lab 03/15/16 0912  CKTOTAL 119  TROPONINI <0.03   BNP: Invalid input(s): POCBNP CBG:  Recent Labs Lab 03/15/16 1415 03/15/16 1631 03/15/16 2029 03/16/16 0757 03/16/16 1157  GLUCAP 296* 287* 323* 219* 223*   HbA1C:  Recent Labs  03/15/16 1541  HGBA1C 8.0*   Urine analysis: No results found for: COLORURINE, APPEARANCEUR, LABSPEC, PHURINE, GLUCOSEU, HGBUR, BILIRUBINUR, KETONESUR, PROTEINUR, UROBILINOGEN, NITRITE, LEUKOCYTESUR Sepsis Labs: @LABRCNTIP (procalcitonin:4,lacticidven:4) )No results found for this or any previous visit (from the past 240 hour(s)).   Scheduled Meds: . sodium chloride   Intravenous Once  . carbidopa-levodopa  1 tablet Oral TID  . DULoxetine  60 mg Oral Daily  . famotidine (PEPCID) IV  20 mg Intravenous Q12H  . fluticasone  2 spray Each Nare Daily  . insulin aspart  0-15 Units Subcutaneous TID WC  . insulin aspart  0-5 Units Subcutaneous QHS  . insulin detemir  10 Units Subcutaneous QHS  . lamoTRIgine  200 mg Oral QHS  . pantoprazole  40 mg Oral BID  . propranolol ER  60 mg Oral Daily  . simvastatin  40 mg Oral QHS  . sodium chloride flush  3 mL Intravenous Q12H   Continuous Infusions:   Procedures/Studies: No results found.  Valerye Kobus, DO  [facility] Pager [phone]  If 7PM-7AM, please contact night-coverage [email or web address] Password [email or web address] 03/16/2016, 3:00 PM   LOS: 1 day

## 2016-03-16 NOTE — Interval H&P Note (Signed)
History and Physical Interval Note:  03/16/2016 9:34 AM  Bryan Flowers  has presented today for surgery, with the diagnosis of Melena and anemia with nausea  The various methods of treatment have been discussed with the patient and family. After consideration of risks, benefits and other options for treatment, the patient has consented to  Procedure(s): ESOPHAGOGASTRODUODENOSCOPY (EGD) (Left) as a surgical intervention .  The patient's history has been reviewed, patient examined, no change in status, stable for surgery.  I have reviewed the patient's chart and labs.  Questions were answered to the patient's satisfaction.     Bryan Flowers

## 2016-03-16 NOTE — Care Management Obs Status (Signed)
[facility] NOTIFICATION   Patient Details  Name: KENSHAWN LYDEN MRN: [id number] Date of Birth: 27-Sep-1961   Medicare Observation Status Notification Given:  Yes    Greg Eckrich, Rory Percy, RN 03/16/2016, 5:02 PM

## 2016-03-16 NOTE — Anesthesia Postprocedure Evaluation (Signed)
Anesthesia Post Note  Patient: Bryan Flowers  Procedure(s) Performed: Procedure(s) (LRB): ESOPHAGOGASTRODUODENOSCOPY (EGD) (Left)  Patient location during evaluation: PACU Anesthesia Type: MAC Level of consciousness: awake and alert Pain management: pain level controlled Vital Signs Assessment: post-procedure vital signs reviewed and stable Respiratory status: spontaneous breathing, nonlabored ventilation, respiratory function stable and patient connected to nasal cannula oxygen Cardiovascular status: stable and blood pressure returned to baseline Anesthetic complications: no    Last Vitals:  Filed Vitals:   03/16/16 1020 03/16/16 1027  BP: 114/64   Pulse: 79 81  Temp:    Resp: 15 21    Last Pain:  Filed Vitals:   03/16/16 1027  PainSc: 4                  Tiajuana Amass

## 2016-03-16 NOTE — H&P (View-Only) (Signed)
CROSS COVER [facility]-GI Reason for Consult: Black stools x 3 days. Referring Physician: ER MD-[facility].  Bryan Flowers is an 55 y.o. male.  HPI:  Mr. Bryan Flowers is a 55 year old white male with multiple medical problems listed below. He was in his usual state of health until about 3 days ago when he started noticing melenic stools. He denies the use or abuse of nonsteroidals there is no history of associated bright red bleeding per rectum he's had some epigastric pain and nausea without vomiting. Since his symptoms started 3 days days ago he's having shortness of breath with exertion and dizziness with generalized malaise and fatigue. He denies experiencing any syncope or near syncope. He has never had a GI workup in the past and has not had a baseline colonoscopy either. His wife who at bedside is helping with his history. He denies a previous history of ulcers, jaundice or colitis. He has a good appetite and his weight has been stable. He denies having any dysphagia or odynophagia. According to the hospital notes he's had reflux esophagitis in the past.    Past Medical History  Diagnosis Date  . Depression   . Diabetes mellitus without complication ([place])   . Hyperlipidemia   . Hypertension   . Bipolar 1 disorder, manic, moderate ([place])   . GERD (gastroesophageal reflux disease)   . Insomnia   . Parkinson disease [facility])    Past Surgical History  Procedure Laterality Date  . Foot fracture surgery Right    Family History  Problem Relation Age of Onset  . Cancer Mother   . Heart disease Father    Social History:  reports that he has never smoked. He does not have any smokeless tobacco history on file. He reports that he does not drink alcohol or use illicit drugs.  Allergies: No Known Allergies  Medications: I have reviewed the patient's current medications.  Results for orders placed or performed during the hospital encounter of 03/15/16 (from the past 48 hour(s))   Glucose, capillary     Status: Abnormal   Collection Time: 03/15/16  2:15 PM  Result Value Ref Range   Glucose-Capillary 296 (H) 65 - 99 mg/dL   Review of Systems  Constitutional: Positive for malaise/fatigue and diaphoresis. Negative for fever, chills and weight loss.  Eyes: Negative.   Respiratory: Negative.   Cardiovascular: Negative.   Gastrointestinal: Positive for heartburn, abdominal pain, blood in stool and melena. Negative for nausea and vomiting.  Musculoskeletal: Positive for myalgias, back pain, joint pain and neck pain.  Skin: Negative.   Neurological: Positive for weakness.  Endo/Heme/Allergies: Negative.   Psychiatric/Behavioral: Positive for depression and memory loss. The patient is nervous/anxious and has insomnia.    Blood pressure 138/59, pulse 89, temperature 97.8 F (36.6 C), temperature source Oral, resp. rate 18, SpO2 100 %. Physical Exam  Constitutional: He is oriented to person, place, and time. He appears well-developed and well-nourished.  Obese, pale appearing  HENT:  Head: Normocephalic and atraumatic.  Eyes: Conjunctivae and EOM are normal. Pupils are equal, round, and reactive to light.  Neck: Normal range of motion.  Cardiovascular: Normal rate and regular rhythm.   Respiratory: Effort normal and breath sounds normal.  GI: Soft. Bowel sounds are normal.  Musculoskeletal: Normal range of motion.  Neurological: He is alert and oriented to person, place, and time.  Skin: Skin is warm and dry.  Psychiatric: He has a normal mood and affect. His behavior is normal. Judgment and thought  content normal.   Assessment/Plan: 1) Melenic stools x 3 days with anemia-will transfuse another unit of PRBC's today and keep the patient nothing by mouth for an EGD tomorrow. Patient is due for baseline colon cancer screening however if the EGD is normal a colonoscopy should be done prior to discharge. Patient has a history of GERD/reflux esophagitis diagnosed in the  past.  2) Uncontrolled diabetes.  3) Hypertension and hyperlipidemia.  4) History of bipolar disorder.  5) Parkinson's disease. 6) Obesity/OSA. 7) On disability for fibromyalgia.  Bryan Flowers 03/15/2016, 3:50 PM

## 2016-03-16 NOTE — Progress Notes (Signed)
Inpatient Diabetes Program Recommendations  AACE/ADA: New Consensus Statement on Inpatient Glycemic Control (2015)  Target Ranges:  Prepandial:   less than 140 mg/dL      Peak postprandial:   less than 180 mg/dL (1-2 hours)      Critically ill patients:  140 - 180 mg/dL   Review of Glycemic Control  Diabetes history: DM 2,  Outpatient Diabetes medications: Byetta 2 mg weekly, Glyburide-metformin 2.5-500 mg BID Current orders for Inpatient glycemic control: Levemir 10 mg QHS, Novolog Moderate + HS scale  Inpatient Diabetes Program Recommendations: Insulin - Basal: Glucose still elevated 219 mg/dl this am after Levemir 10 units given last night. Please consider increasing absal insulin dose to Levemir 16 units QHS.   Thanks,  Tama Headings RN, MSN, [facility] Inpatient Diabetes Coordinator Team Pager [phone] (8a-5p)

## 2016-03-16 NOTE — Op Note (Signed)
[facility] Patient Name: Bryan Flowers Procedure Date : 03/16/2016 MRN: [id number] Attending MD: Carlota Raspberry. Havery Moros , MD Date of Birth: [birth year] CSN: [id number] Age: 55 Admit Type: Inpatient Procedure:                Upper GI endoscopy Indications:              Acute post hemorrhagic anemia, Melena Providers:                Carlota Raspberry. Havery Moros, MD, Carolynn Comment, RN,                            Ralene Bathe, Technician, Tawni Carnes, CRNA Referring MD:              Medicines:                Monitored Anesthesia Care Complications:            No immediate complications. Estimated blood loss:                            Minimal. Estimated Blood Loss:     Estimated blood loss was minimal. Procedure:                Pre-Anesthesia Assessment:                           - Prior to the procedure, a History and Physical                            was performed, and patient medications and                            allergies were reviewed. The patient's tolerance of                            previous anesthesia was also reviewed. The risks                            and benefits of the procedure and the sedation                            options and risks were discussed with the patient.                            All questions were answered, and informed consent                            was obtained. Prior Anticoagulants: The patient has                            taken no previous anticoagulant or antiplatelet                            agents. ASA Grade Assessment: III - A patient with  severe systemic disease. After reviewing the risks                            and benefits, the patient was deemed in                            satisfactory condition to undergo the procedure.                           After obtaining informed consent, the endoscope was                            passed under direct vision. Throughout the             procedure, the patient's blood pressure, pulse, and                            oxygen saturations were monitored continuously. The                            [id number]) scope was introduced through the                            mouth, and advanced to the second part of duodenum.                            The upper GI endoscopy was accomplished without                            difficulty. The patient tolerated the procedure                            well. Scope In: Scope Out: Findings:      Esophagogastric landmarks were identified: the Z-line was found at 43       cm, the gastroesophageal junction was found at 43 cm and the upper       extent of the gastric folds was found at 43 cm from the incisors.      LA Grade A (one or more mucosal breaks less than 5 mm, not extending       between tops of 2 mucosal folds) esophagitis with no bleeding was found.      The exam of the esophagus was otherwise normal.      Three non-bleeding cratered gastric ulcers with no stigmata of bleeding       were found in the gastric antrum. The largest lesion was 5 mm in largest       dimension, smallest 49mm.      The exam of the stomach was otherwise normal.      Biopsies were taken with a cold forceps in the gastric body and in the       gastric antrum for Helicobacter pylori testing.      One non-bleeding cratered duodenal ulcer with no stigmata of bleeding       was found in the duodenal sweep.. The lesion was 8 mm in largest       dimension. It was clean based. There was edema  in the duodenal sweep due       to this lesion but it was able to be traversed.      The exam of the duodenum was otherwise normal. Impression:               - Esophagogastric landmarks identified.                           - LA Grade A esophagitis.                           - Non-bleeding gastric ulcers with no stigmata of                            bleeding.                           - One non-bleeding  duodenal ulcer with no stigmata                            of bleeding.                           - Biopsies were taken with a cold forceps for                            Helicobacter pylori testing.                           Overall, suspect duodenal ulcer / gastric ulcers                            are the most likely cause for the patient's                            symptoms. No high risk stigmata for bleeding noted. Moderate Sedation:      no moderate sedation Recommendation:           - Return patient to hospital ward for ongoing care.                           - Clear liquid diet okay today                           - Start protonix 40mg  twice daily if not already                            taking PPI                           - Await biopsies to asses for H pylori infection,                            treat if positive                           - Advance diet tomorrow if  no further evidence of                            bleeding today. Repeat CBC tomorrow                           - Patient will warrant follow up with me as                            outpatient, following his discharge, for repeat EGD                            to ensure interval healing of ulcers, and also to                            perform colon cancer screening.                           - Continue present medications.                           - NO NSAIDS Procedure Code(s):        --- Professional ---                           [id number], Esophagogastroduodenoscopy, flexible,                            transoral; with biopsy, single or multiple Diagnosis Code(s):        --- Professional ---                           K20.9, Esophagitis, unspecified                           K25.9, Gastric ulcer, unspecified as acute or                            chronic, without hemorrhage or perforation                           K26.9, Duodenal ulcer, unspecified as acute or                            chronic, without  hemorrhage or perforation                           D62, Acute posthemorrhagic anemia                           K92.1, Melena (includes Hematochezia) CPT copyright 2016 American Medical Association. All rights reserved. The codes documented in this report are preliminary and upon coder review may  be revised to meet current compliance requirements. Remo Lipps P. Lakitha Gordy, MD 03/16/2016 10:17:42 AM This report has been signed electronically. Number of Addenda: 0

## 2016-03-17 ENCOUNTER — Encounter (HOSPITAL_COMMUNITY): Payer: Self-pay | Admitting: Gastroenterology

## 2016-03-17 DIAGNOSIS — K921 Melena: Secondary | ICD-10-CM | POA: Diagnosis not present

## 2016-03-17 DIAGNOSIS — K264 Chronic or unspecified duodenal ulcer with hemorrhage: Secondary | ICD-10-CM

## 2016-03-17 DIAGNOSIS — M797 Fibromyalgia: Secondary | ICD-10-CM | POA: Diagnosis not present

## 2016-03-17 DIAGNOSIS — K269 Duodenal ulcer, unspecified as acute or chronic, without hemorrhage or perforation: Secondary | ICD-10-CM | POA: Diagnosis not present

## 2016-03-17 DIAGNOSIS — F3012 Manic episode without psychotic symptoms, moderate: Secondary | ICD-10-CM | POA: Diagnosis not present

## 2016-03-17 DIAGNOSIS — K259 Gastric ulcer, unspecified as acute or chronic, without hemorrhage or perforation: Secondary | ICD-10-CM | POA: Diagnosis not present

## 2016-03-17 DIAGNOSIS — D62 Acute posthemorrhagic anemia: Secondary | ICD-10-CM | POA: Diagnosis not present

## 2016-03-17 LAB — HEMOGLOBIN: Hemoglobin: 8.8 g/dL — ABNORMAL LOW (ref 13.0–17.0)

## 2016-03-17 LAB — BASIC METABOLIC PANEL
Anion gap: 6 (ref 5–15)
BUN: 10 mg/dL (ref 6–20)
CHLORIDE: 105 mmol/L (ref 101–111)
CO2: 27 mmol/L (ref 22–32)
Calcium: 8.4 mg/dL — ABNORMAL LOW (ref 8.9–10.3)
Creatinine, Ser: 0.74 mg/dL (ref 0.61–1.24)
GFR calc non Af Amer: >60 mL/min (ref >60)
Glucose, Bld: 231 mg/dL — ABNORMAL HIGH (ref 65–99)
POTASSIUM: 3.5 mmol/L (ref 3.5–5.1)
SODIUM: 138 mmol/L (ref 135–145)

## 2016-03-17 LAB — TYPE AND SCREEN
ABO/RH(D): A POS
Antibody Screen: NEGATIVE
Unit division: 0
Unit division: 0

## 2016-03-17 LAB — GLUCOSE, CAPILLARY
GLUCOSE-CAPILLARY: 197 mg/dL — AB (ref 65–99)
GLUCOSE-CAPILLARY: 261 mg/dL — AB (ref 65–99)

## 2016-03-17 MED ORDER — INSULIN DETEMIR 100 UNIT/ML ~~LOC~~ SOLN: 18.0000 [IU] | Freq: Every day | SUBCUTANEOUS | Status: DC

## 2016-03-17 MED ORDER — PANTOPRAZOLE SODIUM 40 MG PO TBEC: 40.0000 mg | DELAYED_RELEASE_TABLET | Freq: Two times a day (BID) | ORAL | Status: DC

## 2016-03-17 NOTE — Care Management Note (Signed)
Case Management Note  Patient Details  Name: NICHALOS BRENTON MRN: [id number] Date of Birth: 12/02/1960  Subjective/Objective:   CM following for progression and d/c planning.                 Action/Plan: 03/17/2016 Met with pt on 03/16/2016, pt lives at home with wife, has no DME needs at this time and no HH needs. Plan to d/c to home, pt is ambulatory in his room.   Expected Discharge Date:    03/17/2016              Expected Discharge Plan:  Home/Self Care  In-House Referral:  NA  Discharge planning Services  NA  Post Acute Care Choice:  NA Choice offered to:  NA  DME Arranged:  N/A DME Agency:  NA  HH Arranged:  NA HH Agency:  NA  Status of Service:  Completed, signed off  Medicare Important Message Given:    Date Medicare IM Given:    Medicare IM give by:    Date Additional Medicare IM Given:    Additional Medicare Important Message give by:     If discussed at [place] of Stay Meetings, dates discussed:    Additional Comments:  Adron Bene, RN 03/17/2016, 2:18 PM

## 2016-03-17 NOTE — Discharge Summary (Signed)
Physician Discharge Summary  Bryan Flowers [id number] DOB: 12-May-1961 DOA: 03/15/2016  PCP: Ashok Norris, MD  Admit date: 03/15/2016 Discharge date: 03/17/2016  Admitted From: Home Disposition:  Home Recommendations for Outpatient Follow-up:  1. Follow up with PCP in 1-2 weeks 2. Please obtain BMP/CBC in one week   Home Health:No Equipment/Devices:none  Discharge Condition:stable CODE STATUS:FULL Diet recommendation: Heart Healthy / Carb Modified  Brief/Interim Summary 55 year old male with a history of depression, diabetes mellitus type 2, hyperlipidemia, hypertension, bipolar disorder presented to the emergency department at [facility] with 4 day history of generalized weakness and melanotic stools. The patient also began complaining of shortness of breath with associated nausea for 2 days prior to admission. The patient denied any NSAIDs or alcohol use. Upon presentation, the patient was noted to have serum glucose 494, but did not have an anion gap. Gastroenterology was consulted to assist with management. The patient underwent EGD on 03/16/2016 which revealed grade a esophagitis and nonbleeding duodenal ulcer. The patient was transfused with 3 units PRBC since admission.  Discharge Diagnoses:  Upper GI bleed/melanotic stools/acute blood loss anemia -Based on hemoglobin unclear -Presented with hemoglobin 6.4 -Hemoglobin improved to 8.1 after 3 units PRBC -Appreciate GI consult -03/16/2016 EGD--grade a esophagitis, one nonbleeding duodenal ulcer -It is suspected that the patient's duodenal ulcer/gastric ulcer may have been the likely source of bleed -Advance diet-->tolerated -Trend hemoglobin--> stable, Hgb 8.8 on day of d/c -Continue Protonix 40 mg twice a day x 14 days, then once daily until follow up with GI  Diabetes mellitus type 2, poorly controlled -03/15/2016 hemoglobin A1c--8.0 -increased levemir to 18 units q hs -add premeal insulin 3 units  tiw during the hospitalization -Hold oral agents during hospitalization--resume after d/c along with exanitide -f/u with PCP for further adjustments  PseudoHyponatremia -Secondary to hyperglycemia  Hypertension -Accuretic on hold during hospitalization -BP started to creep up to low 140s-->restart accuretic after d/c  Hyperlipidemia -Continue Zocor  Bipolar disorder -Continue Cymbalta, Lamictal  Parkinson's disease -continue sinemet -continue propranolol   Discharge Instructions     Medication List    STOP taking these medications        gemfibrozil 600 MG tablet  Commonly known as:  LOPID      TAKE these medications        acetaminophen 500 MG tablet  Commonly known as:  TYLENOL  Take 1,000 mg by mouth every 6 (six) hours as needed (pain).     Armodafinil 150 MG tablet  Take 150 mg by mouth daily. Nuvigil     carbidopa-levodopa 50-200 MG tablet  Commonly known as:  SINEMET CR  Take 1 tablet by mouth 3 (three) times daily.     DULoxetine 60 MG capsule  Commonly known as:  CYMBALTA  Take 120 mg by mouth at bedtime.     Exenatide ER 2 MG Pen  Inject 2 mg into the skin once a week.     fluticasone 50 MCG/ACT nasal spray  Commonly known as:  FLONASE  Place 2 sprays into both nostrils daily.     glyBURIDE-metformin 2.5-500 MG tablet  Commonly known as:  GLUCOVANCE  TAKE 2 TABLETS BY MOUTH TWICE DAILY WITH MEALS     insulin detemir 100 UNIT/ML injection  Commonly known as:  LEVEMIR  Inject 0.18 mLs (18 Units total) into the skin at bedtime.     lamoTRIgine 200 MG tablet  Commonly known as:  LAMICTAL  Take 200 mg by mouth at bedtime.  LORazepam 0.5 MG tablet  Commonly known as:  ATIVAN  Take 0.5 mg by mouth 3 (three) times daily as needed for anxiety.     pantoprazole 40 MG tablet  Commonly known as:  PROTONIX  Take 1 tablet (40 mg total) by mouth 2 (two) times daily. X 14 days, then once daily     propranolol ER 60 MG 24 hr capsule  Commonly  known as:  INDERAL LA  Take 60 mg by mouth at bedtime.     QUEtiapine 300 MG tablet  Commonly known as:  SEROQUEL  Take 300 mg by mouth at bedtime.     quinapril-hydrochlorothiazide 20-12.5 MG tablet  Commonly known as:  ACCURETIC  Take 2 tablets by mouth daily.     rOPINIRole 1 MG tablet  Commonly known as:  REQUIP  Take 1 mg by mouth at bedtime.     simvastatin 40 MG tablet  Commonly known as:  ZOCOR  Take 1 tablet (40 mg total) by mouth at bedtime.        No Known Allergies  Consultations:  [facility]   Procedures/Studies: No results found.       Discharge Exam: Filed Vitals:   03/17/16 0411 03/17/16 0808  BP: 125/59 142/63  Pulse: 84 80  Temp: 98.1 F (36.7 C) 98.2 F (36.8 C)  Resp: 18 17   Filed Vitals:   03/16/16 1713 03/16/16 2116 03/17/16 0411 03/17/16 0808  BP: 117/59 127/60 125/59 142/63  Pulse: 79 85 84 80  Temp: 97.8 F (36.6 C) 98 F (36.7 C) 98.1 F (36.7 C) 98.2 F (36.8 C)  TempSrc: Oral Oral Oral Oral  Resp: 19 18 18 17   Weight:  108.636 kg (239 lb 8 oz)    SpO2: 99% 97% 94% 99%    General: Pt is alert, awake, not in acute distress Cardiovascular: RRR, S1/S2 +, no rubs, no gallops Respiratory: CTA bilaterally, no wheezing, no rhonchi Abdominal: Soft, NT, ND, bowel sounds + Extremities: no edema, no cyanosis   The results of significant diagnostics from this hospitalization (including imaging, microbiology, ancillary and laboratory) are listed below for reference.    Significant Diagnostic Studies: No results found.   Microbiology: No results found for this or any previous visit (from the past 240 hour(s)).   Labs: Basic Metabolic Panel:  Recent Labs Lab 03/12/16 1008 03/15/16 0912 03/15/16 1541 03/16/16 0742 03/17/16 0904  NA 140 127* 131* 136 138  K 5.1 4.2 4.4 4.1 3.5  CL 100 96* 102 105 105  CO2 24 19* 23 25 27   GLUCOSE 263* 494* 308* 232* 231*  BUN 22 27* 18 11 10   CREATININE 0.91 1.01 0.83 0.77 0.74    CALCIUM 9.3 7.8* 7.8* 7.9* 8.4*   Liver Function Tests:  Recent Labs Lab 03/12/16 1008 03/15/16 0912  AST 12 15  ALT 13 6*  ALKPHOS 59 42  BILITOT <0.2 0.6  PROT 6.4 5.6*  ALBUMIN 4.3 3.6   No results for input(s): LIPASE, AMYLASE in the last 168 hours. No results for input(s): AMMONIA in the last 168 hours. CBC:  Recent Labs Lab 03/15/16 0908 03/15/16 1541 03/16/16 0742 03/17/16 0904  WBC 9.3 9.2 8.2  --   HGB 6.3* 6.4* 8.1* 8.8*  HCT 18.7* 19.7* 24.2*  --   MCV 89.4 86.8 89.0  --   PLT 216 233 180  --    Cardiac Enzymes:  Recent Labs Lab 03/15/16 0912  CKTOTAL 119  TROPONINI <0.03   BNP:  Invalid input(s): POCBNP CBG:  Recent Labs Lab 03/16/16 1157 03/16/16 1710 03/16/16 2114 03/17/16 0806 03/17/16 1144  GLUCAP 223* 356* 160* 197* 261*    Time coordinating discharge:  Greater than 30 minutes  Signed:  Derius Ghosh, DO [facility] Pager: [phone] 03/17/2016, 12:00 PM

## 2016-03-17 NOTE — Progress Notes (Signed)
      Progress Note   Subjective  Patient had EGD showing gastric and duodenal ulcers yesterday. All clean based. Doing well today. No symptoms of bleeding, tolerating liquids. Hgb stable.   Objective   Vital signs in last 24 hours: Temp:  [97.8 F (36.6 C)-98.2 F (36.8 C)] 98.2 F (36.8 C) (06/13 0808) Pulse Rate:  [79-85] 80 (06/13 0808) Resp:  [17-19] 17 (06/13 0808) BP: (117-142)/(59-63) 142/63 mmHg (06/13 0808) SpO2:  [94 %-99 %] 99 % (06/13 0808) Weight:  [239 lb 8 oz (108.636 kg)] 239 lb 8 oz (108.636 kg) (06/12 2116) Last BM Date: 03/15/16 General:    white Flowers in NAD Heart:  Regular rate and rhythm; no murmurs Lungs: Respirations even and unlabored, lungs CTA bilaterally Abdomen:  Soft, nontender and nondistended. Normal bowel sounds. Extremities:  Without edema. Neurologic:  Alert and oriented,  grossly normal neurologically. Psych:  Cooperative. Normal mood and affect.  Intake/Output from previous day: 06/12 0701 - 06/13 0700 In: 763 [P.O.:360; I.V.:403] Out: 0  Intake/Output this shift: Total I/O In: 240 [P.O.:240] Out: 0   Lab Results:  Recent Labs  03/15/16 0908 03/15/16 1541 03/16/16 0742 03/17/16 0904  WBC 9.3 9.2 8.2  --   HGB 6.3* 6.4* 8.1* 8.8*  HCT 18.7* 19.7* 24.2*  --   PLT 216 233 180  --    BMET  Recent Labs  03/15/16 1541 03/16/16 0742 03/17/16 0904  NA 131* 136 138  K 4.4 4.1 3.5  CL 102 105 105  CO2 23 25 27   GLUCOSE 308* 232* 231*  BUN 18 11 10   CREATININE 0.83 0.77 0.74  CALCIUM 7.8* 7.9* 8.4*   LFT  Recent Labs  03/15/16 0912  PROT 5.6*  ALBUMIN 3.6  AST 15  ALT 6*  ALKPHOS 42  BILITOT 0.6   PT/INR  Recent Labs  03/15/16 0912  LABPROT 14.4  INR 1.10    Studies/Results: No results found.     Assessment / Plan:   Bryan Flowers who presented with melena and anemia. EGD showed multiple small gastric ulcers and a 33mm duodenal ulcer, all clean based without high risk stigmata for bleeding. He is  tolerating liquids, no further evidence of GI bleeding, Hgb stable. At this time recommend the following: -advance to regular diet -okay to discharge home from GI perspective -protonix 40mg  BID x 2 weeks, then 40mg  once daily thereafter until his follow up with me -NO NSAIDS -biopsies obtained to rule out H pylori, he will be contacted with the results -recommend interval repeat EGD in 1-2 months to assess for mucosal healing, and will perform colonoscopy for his screening exam at that time. We will coordinate his follow up.   Please call with questions / concerns.   Dubois Cellar, MD [facility] Pager [phone]     LOS: 2 days   Renelda Loma Daneil Flowers  03/17/2016, 11:04 AM

## 2016-03-17 NOTE — Progress Notes (Signed)
03/17/2016 3:52 PM  Reviewed discharge paperwork with patient.  Went over follow-up appointments, diet, medications, discharge instructions, etc.  Pt asked questions appropriately and verbalized understanding upon completion.  IVs and telemetry removed.  Pt is currently awaiting his wife's arrival to take him home.  Will monitor. Princella Pellegrini

## 2016-03-18 DIAGNOSIS — G2581 Restless legs syndrome: Secondary | ICD-10-CM | POA: Diagnosis not present

## 2016-03-18 DIAGNOSIS — G4726 Circadian rhythm sleep disorder, shift work type: Secondary | ICD-10-CM | POA: Diagnosis not present

## 2016-03-18 DIAGNOSIS — F3181 Bipolar II disorder: Secondary | ICD-10-CM | POA: Diagnosis not present

## 2016-03-19 ENCOUNTER — Encounter: Payer: Self-pay | Admitting: Gastroenterology

## 2016-03-20 ENCOUNTER — Other Ambulatory Visit: Payer: Self-pay

## 2016-03-20 DIAGNOSIS — G2581 Restless legs syndrome: Secondary | ICD-10-CM | POA: Diagnosis not present

## 2016-03-20 DIAGNOSIS — F3181 Bipolar II disorder: Secondary | ICD-10-CM | POA: Diagnosis not present

## 2016-03-20 DIAGNOSIS — G4726 Circadian rhythm sleep disorder, shift work type: Secondary | ICD-10-CM | POA: Diagnosis not present

## 2016-03-24 ENCOUNTER — Ambulatory Visit (INDEPENDENT_AMBULATORY_CARE_PROVIDER_SITE_OTHER): Payer: PPO | Admitting: Family Medicine

## 2016-03-24 ENCOUNTER — Encounter: Payer: Self-pay | Admitting: Family Medicine

## 2016-03-24 VITALS — BP 114/74 | HR 92 | Temp 98.8°F | Resp 18 | Ht 71.0 in | Wt 230.5 lb

## 2016-03-24 DIAGNOSIS — D62 Acute posthemorrhagic anemia: Secondary | ICD-10-CM

## 2016-03-24 DIAGNOSIS — Z862 Personal history of diseases of the blood and blood-forming organs and certain disorders involving the immune mechanism: Secondary | ICD-10-CM | POA: Insufficient documentation

## 2016-03-24 NOTE — Progress Notes (Signed)
Name: Bryan Flowers   MRN: [id number]    DOB: 1960/11/04   Date:03/24/2016       Progress Note  Subjective  Chief Complaint  Chief Complaint  Patient presents with  . Hospitalization Follow-up    Blood loss/anemia    HPI  Anemia from Acute Blood Loss:  Pt. Is here for ER follow up, was seen at [facility] ER for GI hemorrhage, was admitted to [facility]. Pt. Reports he started getting extremely weak, was passing black tarry stools, thirsty and pale. His Hgb on presentation was found to be 6.3, he was transfused 1 Unit of PRBCs and then transferred to [facility] where he received 4 more infusions. He feels somewhat better, still feels weak on exertion. He was not started on outpatient iron therapy and would like to receive the IV form of iron because the pill-form can irritate his gastritis.    Past Medical History  Diagnosis Date  . Depression   . Diabetes mellitus without complication ([place])   . Hyperlipidemia   . Hypertension   . Bipolar 1 disorder, manic, moderate ([place])   . GERD (gastroesophageal reflux disease)   . Insomnia   . Parkinson disease [facility])     Past Surgical History  Procedure Laterality Date  . Foot fracture surgery Right   . Esophagogastroduodenoscopy Left 03/16/2016    Procedure: ESOPHAGOGASTRODUODENOSCOPY (EGD);  Surgeon: Manus Gunning, MD;  Location: [place];  Service: Gastroenterology;  Laterality: Left;    Family History  Problem Relation Age of Onset  . Cancer Mother   . Heart disease Father     Social History   Social History  . Marital Status: Married    Spouse Name: N/A  . Number of Children: N/A  . Years of Education: N/A   Occupational History  . Not on file.   Social History Main Topics  . Smoking status: Never Smoker   . Smokeless tobacco: Not on file  . Alcohol Use: No  . Drug Use: No  . Sexual Activity:    Partners: Female   Other Topics Concern  . Not on file   Social History Narrative     Current  outpatient prescriptions:  .  acetaminophen (TYLENOL) 500 MG tablet, Take 1,000 mg by mouth every 6 (six) hours as needed (pain)., Disp: , Rfl:  .  Armodafinil 150 MG tablet, Take 150 mg by mouth daily. Nuvigil, Disp: , Rfl:  .  carbidopa-levodopa (SINEMET CR) 50-200 MG per tablet, Take 1 tablet by mouth 3 (three) times daily. , Disp: , Rfl:  .  DULoxetine (CYMBALTA) 60 MG capsule, Take 120 mg by mouth at bedtime. , Disp: , Rfl:  .  Exenatide ER 2 MG PEN, Inject 2 mg into the skin once a week., Disp: 1 each, Rfl: 11 .  fluticasone (FLONASE) 50 MCG/ACT nasal spray, Place 2 sprays into both nostrils daily., Disp: 16 g, Rfl: 0 .  glyBURIDE-metformin (GLUCOVANCE) 2.5-500 MG tablet, TAKE 2 TABLETS BY MOUTH TWICE DAILY WITH MEALS, Disp: 120 tablet, Rfl: 0 .  insulin detemir (LEVEMIR) 100 UNIT/ML injection, Inject 0.18 mLs (18 Units total) into the skin at bedtime., Disp: 10 mL, Rfl: 1 .  lamoTRIgine (LAMICTAL) 200 MG tablet, Take 200 mg by mouth at bedtime. , Disp: , Rfl:  .  LORazepam (ATIVAN) 0.5 MG tablet, Take 0.5 mg by mouth 3 (three) times daily as needed for anxiety. , Disp: , Rfl:  .  pantoprazole (PROTONIX) 40 MG tablet, Take 1 tablet (  40 mg total) by mouth 2 (two) times daily. X 14 days, then once daily, Disp: 60 tablet, Rfl: 1 .  propranolol ER (INDERAL LA) 60 MG 24 hr capsule, Take 60 mg by mouth at bedtime. , Disp: , Rfl:  .  QUEtiapine (SEROQUEL) 300 MG tablet, Take 300 mg by mouth at bedtime., Disp: , Rfl: 5 .  quinapril-hydrochlorothiazide (ACCURETIC) 20-12.5 MG tablet, Take 2 tablets by mouth daily., Disp: 180 tablet, Rfl: 0 .  rOPINIRole (REQUIP) 1 MG tablet, Take 1 mg by mouth at bedtime., Disp: , Rfl:  .  simvastatin (ZOCOR) 40 MG tablet, Take 1 tablet (40 mg total) by mouth at bedtime., Disp: 90 tablet, Rfl: 1  No Known Allergies   Review of Systems  Constitutional: Positive for malaise/fatigue. Negative for fever and chills.  Respiratory: Negative for shortness of breath.    Cardiovascular: Negative for chest pain.  Gastrointestinal: Negative for nausea, vomiting, abdominal pain, blood in stool and melena.     Objective  Filed Vitals:   03/24/16 1537  BP: 114/74  Pulse: 92  Temp: 98.8 F (37.1 C)  TempSrc: Oral  Resp: 18  Height: 5\' 11"  (1.803 m)  Weight: 230 lb 8 oz (104.554 kg)  SpO2: 97%    Physical Exam  Constitutional: He is well-developed, well-nourished, and in no distress.  Cardiovascular: Normal rate, regular rhythm, S1 normal and S2 normal.   Murmur heard.  Systolic murmur is present with a grade of 3/6  Abdominal: Soft. Bowel sounds are normal. There is no tenderness.  Musculoskeletal:       Right shoulder: He exhibits swelling.       Right ankle: He exhibits no swelling.       Left ankle: He exhibits no swelling.  Nursing note and vitals reviewed.       Assessment & Plan  1. Anemia associated with acute blood loss Patient is not on by mouth iron therapy because of gastritis. We will refer to hematology for IV iron therapy and further workup of anemia.  - Ambulatory referral to Hematology   [facility] 03/24/2016 3:45 PM

## 2016-03-27 ENCOUNTER — Encounter (HOSPITAL_COMMUNITY): Payer: Self-pay

## 2016-03-27 ENCOUNTER — Encounter (HOSPITAL_COMMUNITY): Payer: PPO

## 2016-03-27 ENCOUNTER — Encounter (HOSPITAL_COMMUNITY): Payer: PPO | Attending: Hematology & Oncology | Admitting: Hematology

## 2016-03-27 VITALS — BP 114/73 | HR 103 | Temp 97.5°F | Resp 16 | Ht 72.0 in | Wt 231.0 lb

## 2016-03-27 DIAGNOSIS — D509 Iron deficiency anemia, unspecified: Secondary | ICD-10-CM

## 2016-03-27 DIAGNOSIS — K922 Gastrointestinal hemorrhage, unspecified: Secondary | ICD-10-CM

## 2016-03-27 DIAGNOSIS — K209 Esophagitis, unspecified: Secondary | ICD-10-CM | POA: Diagnosis not present

## 2016-03-27 DIAGNOSIS — E119 Type 2 diabetes mellitus without complications: Secondary | ICD-10-CM | POA: Diagnosis not present

## 2016-03-27 DIAGNOSIS — F319 Bipolar disorder, unspecified: Secondary | ICD-10-CM | POA: Diagnosis not present

## 2016-03-27 DIAGNOSIS — E785 Hyperlipidemia, unspecified: Secondary | ICD-10-CM | POA: Diagnosis not present

## 2016-03-27 DIAGNOSIS — Z794 Long term (current) use of insulin: Secondary | ICD-10-CM | POA: Diagnosis not present

## 2016-03-27 DIAGNOSIS — K269 Duodenal ulcer, unspecified as acute or chronic, without hemorrhage or perforation: Secondary | ICD-10-CM | POA: Diagnosis not present

## 2016-03-27 DIAGNOSIS — Z9889 Other specified postprocedural states: Secondary | ICD-10-CM | POA: Diagnosis not present

## 2016-03-27 DIAGNOSIS — I10 Essential (primary) hypertension: Secondary | ICD-10-CM | POA: Insufficient documentation

## 2016-03-27 DIAGNOSIS — F329 Major depressive disorder, single episode, unspecified: Secondary | ICD-10-CM | POA: Insufficient documentation

## 2016-03-27 DIAGNOSIS — D500 Iron deficiency anemia secondary to blood loss (chronic): Secondary | ICD-10-CM | POA: Diagnosis not present

## 2016-03-27 DIAGNOSIS — G20 Parkinson's disease: Secondary | ICD-10-CM | POA: Insufficient documentation

## 2016-03-27 DIAGNOSIS — Z79899 Other long term (current) drug therapy: Secondary | ICD-10-CM | POA: Insufficient documentation

## 2016-03-27 DIAGNOSIS — K219 Gastro-esophageal reflux disease without esophagitis: Secondary | ICD-10-CM | POA: Insufficient documentation

## 2016-03-27 LAB — VITAMIN B12: Vitamin B-12: 231 pg/mL (ref 180–914)

## 2016-03-27 LAB — COMPREHENSIVE METABOLIC PANEL
ALT: 9 U/L — AB (ref 17–63)
AST: 16 U/L (ref 15–41)
Albumin: 4.4 g/dL (ref 3.5–5.0)
Alkaline Phosphatase: 67 U/L (ref 38–126)
Anion gap: 9 (ref 5–15)
BUN: 17 mg/dL (ref 6–20)
CHLORIDE: 97 mmol/L — AB (ref 101–111)
CO2: 26 mmol/L (ref 22–32)
CREATININE: 0.93 mg/dL (ref 0.61–1.24)
Calcium: 8.7 mg/dL — ABNORMAL LOW (ref 8.9–10.3)
Glucose, Bld: 301 mg/dL — ABNORMAL HIGH (ref 65–99)
Potassium: 4.5 mmol/L (ref 3.5–5.1)
Sodium: 132 mmol/L — ABNORMAL LOW (ref 135–145)
Total Bilirubin: 0.5 mg/dL (ref 0.3–1.2)
Total Protein: 7.2 g/dL (ref 6.5–8.1)

## 2016-03-27 LAB — IRON AND TIBC
IRON: 18 ug/dL — AB (ref 45–182)
Saturation Ratios: 3 % — ABNORMAL LOW (ref 17.9–39.5)
TIBC: 540 ug/dL — ABNORMAL HIGH (ref 250–450)
UIBC: 522 ug/dL

## 2016-03-27 LAB — RETICULOCYTES
RBC.: 3.6 MIL/uL — ABNORMAL LOW (ref 4.22–5.81)
RETIC COUNT ABSOLUTE: 100.8 10*3/uL (ref 19.0–186.0)
Retic Ct Pct: 2.8 % (ref 0.4–3.1)

## 2016-03-27 LAB — FERRITIN: FERRITIN: 10 ng/mL — AB (ref 24–336)

## 2016-03-27 NOTE — Progress Notes (Signed)
Bryan Flowers Kitchen    HEMATOLOGY/ONCOLOGY CONSULTATION NOTE  Date of Service: 03/27/2016  Patient Care Team: Ashok Norris, MD as PCP - General (Family Medicine)  CHIEF COMPLAINTS/PURPOSE OF CONSULTATION:  Anemia  HISTORY OF PRESENTING ILLNESS:   Bryan Flowers is a wonderful 55 y.o. male who has been referred to [place] by Dr  Keith Rake for evaluation and management of Anemia.  Patient has a history of hypertension, dyslipidemia, diabetes type 2, bipolar disorder, Parkinson's disease with chronic fatigue who was admitted to [facility] on 03/15/2016 after initially presenting to the emergency room at [place] with a four-day history of generalized weakness melanotic stools. He also had some shortness of breath and associated nausea for 2 days. The patient notes that he was using ibuprofen twice a day and advil several times a month for pain issues. Denies significant alcohol use. He was also noted to have a blood sugar of nearly 500 on presentation. GI was consulted and the patient underwent an EGD on 03/16/2016 which revealed grade a esophagitis and nonbleeding duodenal ulcer which was noted to be Helicobacter pylori negative. Patient received about 5 units of PRBCs during hospitalization. Hemoglobin on presentation was 6.4 and it was 8.8 on discharge from the hospital. Gastroenterology thought that the duodenal ulcers on the likely source of his blood loss. He was sent home on Protonix 40 mg by mouth twice a day and has a follow-up with gastroenterology repeat endoscopy in a couple of months.  Patient reports no further overt GI bleeding since discharge on 03/17/2016. Still feels fatigued though better than when he was admitted.  Patient has some fatigue at baseline due to his multiple medical comorbidities including uncontrolled diabetes and Parkinson's. He also notes that he has had several infections including sinus infection Ear infections and strep throat this year which could be  due to uncontrolled diabetes and might worsen his Parkinson's.    MEDICAL HISTORY:  Past Medical History  Diagnosis Date  . Depression   . Diabetes mellitus without complication ([place])   . Hyperlipidemia   . Hypertension   . Bipolar 1 disorder, manic, moderate ([place])   . GERD (gastroesophageal reflux disease)   . Insomnia   . Parkinson disease [facility])     SURGICAL HISTORY: Past Surgical History  Procedure Laterality Date  . Foot fracture surgery Right   . Esophagogastroduodenoscopy Left 03/16/2016    Procedure: ESOPHAGOGASTRODUODENOSCOPY (EGD);  Surgeon: Manus Gunning, MD;  Location: [place];  Service: Gastroenterology;  Laterality: Left;    SOCIAL HISTORY: Social History   Social History  . Marital Status: Married    Spouse Name: N/A  . Number of Children: N/A  . Years of Education: N/A   Occupational History  . Not on file.   Social History Main Topics  . Smoking status: Never Smoker   . Smokeless tobacco: Not on file  . Alcohol Use: No  . Drug Use: No  . Sexual Activity:    Partners: Female   Other Topics Concern  . Not on file   Social History Narrative    FAMILY HISTORY: Family History  Problem Relation Age of Onset  . Cancer Mother   . Heart disease Father     ALLERGIES:  has No Known Allergies.  MEDICATIONS:  Current Outpatient Prescriptions  Medication Sig Dispense Refill  . acetaminophen (TYLENOL) 500 MG tablet Take 1,000 mg by mouth every 6 (six) hours as needed (pain).    . Armodafinil 150 MG tablet Take 150  mg by mouth daily. Nuvigil    . carbidopa-levodopa (SINEMET CR) 50-200 MG per tablet Take 1 tablet by mouth 3 (three) times daily.     . DULoxetine (CYMBALTA) 60 MG capsule Take 120 mg by mouth at bedtime.     . Exenatide ER 2 MG PEN Inject 2 mg into the skin once a week. 1 each 11  . fluticasone (FLONASE) 50 MCG/ACT nasal spray Place 2 sprays into both nostrils daily. 16 g 0  . glyBURIDE-metformin (GLUCOVANCE) 2.5-500 MG  tablet TAKE 2 TABLETS BY MOUTH TWICE DAILY WITH MEALS 120 tablet 0  . insulin detemir (LEVEMIR) 100 UNIT/ML injection Inject 0.18 mLs (18 Units total) into the skin at bedtime. 10 mL 1  . lamoTRIgine (LAMICTAL) 200 MG tablet Take 200 mg by mouth at bedtime.     Bryan Flowers Kitchen LORazepam (ATIVAN) 0.5 MG tablet Take 0.5 mg by mouth 3 (three) times daily as needed for anxiety.     . pantoprazole (PROTONIX) 40 MG tablet Take 1 tablet (40 mg total) by mouth 2 (two) times daily. X 14 days, then once daily 60 tablet 1  . propranolol ER (INDERAL LA) 60 MG 24 hr capsule Take 60 mg by mouth at bedtime.     Bryan Flowers Kitchen QUEtiapine (SEROQUEL) 300 MG tablet Take 300 mg by mouth at bedtime.  5  . quinapril-hydrochlorothiazide (ACCURETIC) 20-12.5 MG tablet Take 2 tablets by mouth daily. 180 tablet 0  . rOPINIRole (REQUIP) 1 MG tablet Take 1 mg by mouth at bedtime.    . simvastatin (ZOCOR) 40 MG tablet Take 1 tablet (40 mg total) by mouth at bedtime. 90 tablet 1   No current facility-administered medications for this visit.    REVIEW OF SYSTEMS:    10 Point review of Systems was done is negative except as noted above.  PHYSICAL EXAMINATION: ECOG PERFORMANCE STATUS: 2 - Symptomatic, <50% confined to bed  . Filed Vitals:   03/27/16 1135  BP: 114/73  Pulse: 103  Temp: 97.5 F (36.4 C)  Resp: 16   Filed Weights   03/27/16 1135  Weight: 231 lb (104.781 kg)   .Body mass index is 31.32 kg/(m^2).  GENERAL:alert, in no acute distress and comfortable SKIN: skin color, texture, turgor are normal, no rashes or significant lesions EYES: normal, conjunctiva are pink and non-injected, sclera clear OROPHARYNX:no exudate, no erythema and lips, buccal mucosa, and tongue normal  NECK: supple, no JVD, thyroid normal size, non-tender, without nodularity LYMPH:  no palpable lymphadenopathy in the cervical, axillary or inguinal LUNGS: clear to auscultation with normal respiratory effort HEART: regular rate & rhythm,  no murmurs and no  lower extremity edema ABDOMEN: abdomen soft, non-tender, normoactive bowel sounds  Musculoskeletal: no cyanosis of digits and no clubbing  PSYCH: alert & oriented x 3 with fluent speech NEURO: no focal motor/sensory deficits  LABORATORY DATA:  I have reviewed the data as listed  . CBC Latest Ref Rng 03/17/2016 03/16/2016 03/15/2016  WBC 4.0 - 10.5 K/uL - 8.2 9.2  Hemoglobin 13.0 - 17.0 g/dL 8.8(L) 8.1(L) 6.4(LL)  Hematocrit 39.0 - 52.0 % - 24.2(L) 19.7(L)  Platelets 150 - 400 K/uL - 180 233    . CMP Latest Ref Rng 03/27/2016 03/17/2016 03/16/2016  Glucose 65 - 99 mg/dL 301(H) 231(H) 232(H)  BUN 6 - 20 mg/dL 17 10 11   Creatinine [id number] - 1.24 mg/dL 0.93 0.74 0.77  Sodium 135 - 145 mmol/L 132(L) 138 136  Potassium 3.5 - 5.1 mmol/L 4.5 3.5 4.1  Chloride 101 -  111 mmol/L 97(L) 105 105  CO2 22 - 32 mmol/L 26 27 25   Calcium 8.9 - 10.3 mg/dL 8.7(L) 8.4(L) 7.9(L)  Total Protein 6.5 - 8.1 g/dL 7.2 - -  Total Bilirubin 0.3 - 1.2 mg/dL 0.5 - -  Alkaline Phos 38 - 126 U/L 67 - -  AST 15 - 41 U/L 16 - -  ALT 17 - 63 U/L 9(L) - -   Component     Latest Ref Rng 03/27/2016  Iron     45 - 182 ug/dL 18 (L)  TIBC     250 - 450 ug/dL 540 (H)  Saturation Ratios     17.9 - 39.5 % 3 (L)  UIBC      522  Ferritin     24 - 336 ng/mL 10 (L)  Vitamin B12     180 - 914 pg/mL 231    RADIOGRAPHIC STUDIES: I have personally reviewed the radiological images as listed and agreed with the findings in the report. No results found.  ASSESSMENT & PLAN:   55 year old male with  #1 Anemia due to acute blood loss resultant severe iron deficiency. Patient received 5 units of PRBCs due to significant upper GI bleeding.  Patient with severe iron deficiency with a ferritin level of 10 and iron saturation of 3% . He reports that he does not tolerate oral iron and would want IV iron replacement instead.  #2 upper GI bleeding due to duodenal ulcers likely related to NSAID use . Helicobacter pylori negative  .  #3 multifactorial fatigue due to anemia/iron deficiency but also due to uncontrolled diabetes and Parkinson's disease and some element of neurasthenia due to his mental health issues.  PLAN -absolutely avoid NSAID use . -Follow-up with gastric pathology as per the recommendations for repeat endoscopy to ensure healing of ulcers . -Continue Protonix twice a day as per GI  -We'll replace his iron IV with IV Feraheme 510 mg every weekly 2 doses . -Patient was counseled to stop taking vitamin B complex over-the-counter to support accelerated hematopoiesis that we expect after IV iron replacement . -labs today  Return to care with Dr. Whitney Muse in 8 weeks with rpt labs  . Orders Placed This Encounter  Procedures  . Comprehensive metabolic panel    Standing Status: Future     Number of Occurrences: 1     Standing Expiration Date: 03/27/2017  . Ferritin    Standing Status: Future     Number of Occurrences: 1     Standing Expiration Date: 03/27/2017  . Iron and TIBC    Standing Status: Future     Number of Occurrences: 1     Standing Expiration Date: 03/27/2017  . Vitamin B12    Standing Status: Future     Number of Occurrences: 1     Standing Expiration Date: 03/27/2017  . CBC With Differential    Standing Status: Future     Number of Occurrences: 1     Standing Expiration Date: 03/27/2017  . Reticulocytes    Standing Status: Future     Number of Occurrences: 1     Standing Expiration Date: 03/27/2017    All of the patients questions were answered with apparent satisfaction. The patient knows to call the clinic with any problems, questions or concerns.  I spent 50 minutes counseling the patient face to face. The total time spent in the appointment was 60 minutes and more than 50% was on counseling and direct patient cares.  Sullivan Lone MD [place] AAHIVMS [facility] Hematology/Oncology Physician [facility]  (Office):       [phone] (Work cell):   [phone] (Fax):           [phone]  03/27/2016 12:07 PM

## 2016-03-27 NOTE — Patient Instructions (Signed)
[place] at [facility] Discharge Instructions  RECOMMENDATIONS MADE BY THE CONSULTANT AND ANY TEST RESULTS WILL BE SENT TO YOUR REFERRING PHYSICIAN.  You were seen by Dr. Irene Limbo today.  Lab work today. Office will notify you of results.  Return to clinic in 8 weeks for follow-up appointment with Dr. Whitney Muse.   Call clinic with any questions or concerns.   Thank you for choosing [place] at [facility] to provide your oncology and hematology care.  To afford each patient quality time with our provider, please arrive at least 15 minutes before your scheduled appointment time.   Beginning January 23rd 2017 lab work for the [facility] will be done in the  Main lab at [facility] on 1st floor. If you have a lab appointment with the [place] please come in thru the  Main Entrance and check in at the main information desk  You need to re-schedule your appointment should you arrive 10 or more minutes late.  We strive to give you quality time with our providers, and arriving late affects you and other patients whose appointments are after yours.  Also, if you no show three or more times for appointments you may be dismissed from the clinic at the providers discretion.     Again, thank you for choosing [facility].  Our hope is that these requests will decrease the amount of time that you wait before being seen by our physicians.       _____________________________________________________________  Should you have questions after your visit to [facility], please contact our office at [phone] between the hours of 8:30 a.m. and 4:30 p.m.  Voicemails left after 4:30 p.m. will not be returned until the following business day.  For prescription refill requests, have your pharmacy contact our office.         Resources For Cancer Patients and their Caregivers ? American Cancer Society: Can assist with  transportation, wigs, general needs, runs Look Good Feel Better.        ([phone] ? Cancer Care: Provides financial assistance, online support groups, medication/co-pay assistance.  1-800-813-HOPE 432-433-4006) ? [place] Assists [place] cancer patients and their families through emotional , educational and financial support.  ([phone] 844-0879 ? [place] DSS Where to apply for food stamps, Medicaid and utility assistance. 458-658-1776 ? RCATS: Transportation to medical appointments. 425-503-1525 ? Social Security Administration: May apply for disability if have a Stage IV cancer. 331-110-1376 [phone]) 783-8432 ? [employer], Disability and Transit Services: Assists with nutrition, care and transit needs. [place] Support Programs: @10RELATIVEDAYS @ > Cancer Support Group  2nd Tuesday of the month 1pm-2pm, Journey Room  > Creative Journey  3rd Tuesday of the month 1130am-1pm, Journey Room  > Look Good Feel Better  1st Wednesday of the month 10am-12 noon, Journey Room (Call [place] to register [phone])

## 2016-03-30 DIAGNOSIS — Z862 Personal history of diseases of the blood and blood-forming organs and certain disorders involving the immune mechanism: Secondary | ICD-10-CM | POA: Insufficient documentation

## 2016-03-30 MED ORDER — B COMPLEX VITAMINS PO CAPS: 1.0000 | ORAL_CAPSULE | Freq: Every day | ORAL | Status: DC

## 2016-04-01 ENCOUNTER — Ambulatory Visit (HOSPITAL_COMMUNITY): Payer: PPO | Admitting: Hematology & Oncology

## 2016-04-02 ENCOUNTER — Encounter (HOSPITAL_BASED_OUTPATIENT_CLINIC_OR_DEPARTMENT_OTHER): Payer: PPO

## 2016-04-02 VITALS — BP 125/64 | HR 94 | Temp 97.5°F | Resp 18

## 2016-04-02 DIAGNOSIS — D509 Iron deficiency anemia, unspecified: Secondary | ICD-10-CM | POA: Diagnosis not present

## 2016-04-02 DIAGNOSIS — D62 Acute posthemorrhagic anemia: Secondary | ICD-10-CM | POA: Diagnosis not present

## 2016-04-02 MED ORDER — SODIUM CHLORIDE 0.9 % IV SOLN
Freq: Once | INTRAVENOUS | Status: AC
Start: 2016-04-02 — End: 2016-04-02
  Administered 2016-04-02: 15:00:00 via INTRAVENOUS

## 2016-04-02 MED ORDER — ALTEPLASE 2 MG IJ SOLR: 2.0000 mg | Freq: Once | INTRAMUSCULAR | Status: DC | PRN

## 2016-04-02 MED ORDER — SODIUM CHLORIDE 0.9 % IV SOLN
510.0000 mg | Freq: Once | INTRAVENOUS | Status: AC
  Administered 2016-04-02: 510 mg via INTRAVENOUS
  Filled 2016-04-02: qty 17

## 2016-04-02 MED ORDER — SODIUM CHLORIDE 0.9 % IJ SOLN: 3.0000 mL | Freq: Once | INTRAMUSCULAR | Status: DC | PRN

## 2016-04-02 MED ORDER — HEPARIN SOD (PORK) LOCK FLUSH 100 UNIT/ML IV SOLN: 500.0000 [IU] | Freq: Once | INTRAVENOUS | Status: DC | PRN

## 2016-04-02 MED ORDER — HEPARIN SOD (PORK) LOCK FLUSH 100 UNIT/ML IV SOLN: 250.0000 [IU] | Freq: Once | INTRAVENOUS | Status: DC | PRN

## 2016-04-02 MED ORDER — SODIUM CHLORIDE 0.9 % IJ SOLN: 10.0000 mL | INTRAMUSCULAR | Status: DC | PRN

## 2016-04-02 NOTE — Patient Instructions (Signed)
[place] at [facility] Discharge Instructions  RECOMMENDATIONS MADE BY THE CONSULTANT AND ANY TEST RESULTS WILL BE SENT TO YOUR REFERRING PHYSICIAN.  IV Feraheme given today Follow up as scheduled. Call for any concerns or questions  Thank you for choosing [place] at [facility] to provide your oncology and hematology care.  To afford each patient quality time with our provider, please arrive at least 15 minutes before your scheduled appointment time.   Beginning January 23rd 2017 lab work for the [facility] will be done in the  Main lab at [facility] on 1st floor. If you have a lab appointment with the [place] please come in thru the  Main Entrance and check in at the main information desk  You need to re-schedule your appointment should you arrive 10 or more minutes late.  We strive to give you quality time with our providers, and arriving late affects you and other patients whose appointments are after yours.  Also, if you no show three or more times for appointments you may be dismissed from the clinic at the providers discretion.     Again, thank you for choosing [facility].  Our hope is that these requests will decrease the amount of time that you wait before being seen by our physicians.       _____________________________________________________________  Should you have questions after your visit to [facility], please contact our office at [phone] between the hours of 8:30 a.m. and 4:30 p.m.  Voicemails left after 4:30 p.m. will not be returned until the following business day.  For prescription refill requests, have your pharmacy contact our office.         Resources For Cancer Patients and their Caregivers ? American Cancer Society: Can assist with transportation, wigs, general needs, runs Look Good Feel Better.        639-813-6649 ? Cancer Care: Provides financial assistance,  online support groups, medication/co-pay assistance.  1-800-813-HOPE 970-686-9248) ? [place] Assists [place] cancer patients and their families through emotional , educational and financial support.  (417)529-7771 ? [place] DSS Where to apply for food stamps, Medicaid and utility assistance. (930)260-3156 ? RCATS: Transportation to medical appointments. (563)592-9123 ? Social Security Administration: May apply for disability if have a Stage IV cancer. 6406367680 850-089-3511 ? [employer], Disability and Transit Services: Assists with nutrition, care and transit needs. [place] Support Programs: @10RELATIVEDAYS @ > Cancer Support Group  2nd Tuesday of the month 1pm-2pm, Journey Room  > Creative Journey  3rd Tuesday of the month 1130am-1pm, Journey Room  > Look Good Feel Better  1st Wednesday of the month 10am-12 noon, Journey Room (Call [place] to register [phone])

## 2016-04-02 NOTE — Progress Notes (Signed)
Patient received IV Feraheme today per orders. Patient tolerated well without incident.

## 2016-04-10 ENCOUNTER — Encounter (HOSPITAL_COMMUNITY): Payer: PPO | Attending: Hematology & Oncology

## 2016-04-10 ENCOUNTER — Encounter (HOSPITAL_COMMUNITY): Payer: Self-pay

## 2016-04-10 VITALS — BP 137/70 | HR 88 | Temp 98.0°F | Resp 18

## 2016-04-10 DIAGNOSIS — D500 Iron deficiency anemia secondary to blood loss (chronic): Secondary | ICD-10-CM | POA: Insufficient documentation

## 2016-04-10 DIAGNOSIS — F329 Major depressive disorder, single episode, unspecified: Secondary | ICD-10-CM | POA: Insufficient documentation

## 2016-04-10 DIAGNOSIS — E785 Hyperlipidemia, unspecified: Secondary | ICD-10-CM | POA: Insufficient documentation

## 2016-04-10 DIAGNOSIS — Z9889 Other specified postprocedural states: Secondary | ICD-10-CM | POA: Insufficient documentation

## 2016-04-10 DIAGNOSIS — I10 Essential (primary) hypertension: Secondary | ICD-10-CM | POA: Insufficient documentation

## 2016-04-10 DIAGNOSIS — Z794 Long term (current) use of insulin: Secondary | ICD-10-CM | POA: Insufficient documentation

## 2016-04-10 DIAGNOSIS — D509 Iron deficiency anemia, unspecified: Secondary | ICD-10-CM

## 2016-04-10 DIAGNOSIS — F319 Bipolar disorder, unspecified: Secondary | ICD-10-CM | POA: Insufficient documentation

## 2016-04-10 DIAGNOSIS — K219 Gastro-esophageal reflux disease without esophagitis: Secondary | ICD-10-CM | POA: Insufficient documentation

## 2016-04-10 DIAGNOSIS — K922 Gastrointestinal hemorrhage, unspecified: Secondary | ICD-10-CM | POA: Diagnosis not present

## 2016-04-10 DIAGNOSIS — K269 Duodenal ulcer, unspecified as acute or chronic, without hemorrhage or perforation: Secondary | ICD-10-CM | POA: Insufficient documentation

## 2016-04-10 DIAGNOSIS — Z79899 Other long term (current) drug therapy: Secondary | ICD-10-CM | POA: Insufficient documentation

## 2016-04-10 DIAGNOSIS — G20 Parkinson's disease: Secondary | ICD-10-CM | POA: Insufficient documentation

## 2016-04-10 DIAGNOSIS — K209 Esophagitis, unspecified: Secondary | ICD-10-CM | POA: Insufficient documentation

## 2016-04-10 DIAGNOSIS — E119 Type 2 diabetes mellitus without complications: Secondary | ICD-10-CM | POA: Insufficient documentation

## 2016-04-10 DIAGNOSIS — D62 Acute posthemorrhagic anemia: Secondary | ICD-10-CM

## 2016-04-10 MED ORDER — FERUMOXYTOL INJECTION 510 MG/17 ML
510.0000 mg | Freq: Once | INTRAVENOUS | Status: AC
  Administered 2016-04-10: 510 mg via INTRAVENOUS
  Filled 2016-04-10: qty 17

## 2016-04-10 MED ORDER — SODIUM CHLORIDE 0.9 % IV SOLN
Freq: Once | INTRAVENOUS | Status: AC
  Administered 2016-04-10: 14:00:00 via INTRAVENOUS

## 2016-04-10 MED ORDER — SODIUM CHLORIDE 0.9 % IJ SOLN
10.0000 mL | INTRAMUSCULAR | Status: DC | PRN
  Administered 2016-04-10: 10 mL
  Filled 2016-04-10: qty 10

## 2016-04-10 NOTE — Patient Instructions (Signed)
[place] at [facility] Discharge Instructions  RECOMMENDATIONS MADE BY THE CONSULTANT AND ANY TEST RESULTS WILL BE SENT TO YOUR REFERRING PHYSICIAN.  Iron infusion today. Return as scheduled for iron infusion.  Return as scheduled for office visit.   Thank you for choosing [place] at [facility] to provide your oncology and hematology care.  To afford each patient quality time with our provider, please arrive at least 15 minutes before your scheduled appointment time.   Beginning January 23rd 2017 lab work for the [facility] will be done in the  Main lab at [facility] on 1st floor. If you have a lab appointment with the [place] please come in thru the  Main Entrance and check in at the main information desk  You need to re-schedule your appointment should you arrive 10 or more minutes late.  We strive to give you quality time with our providers, and arriving late affects you and other patients whose appointments are after yours.  Also, if you no show three or more times for appointments you may be dismissed from the clinic at the providers discretion.     Again, thank you for choosing [facility].  Our hope is that these requests will decrease the amount of time that you wait before being seen by our physicians.       _____________________________________________________________  Should you have questions after your visit to [facility], please contact our office at [phone] between the hours of 8:30 a.m. and 4:30 p.m.  Voicemails left after 4:30 p.m. will not be returned until the following business day.  For prescription refill requests, have your pharmacy contact our office.         Resources For Cancer Patients and their Caregivers ? American Cancer Society: Can assist with transportation, wigs, general needs, runs Look Good Feel Better.        ([phone] [phone] ? Cancer Care: Provides  financial assistance, online support groups, medication/co-pay assistance.  1-800-813-HOPE 989-219-8799) ? [place] Assists [place] cancer patients and their families through emotional , educational and financial support.  9013317562 ? [place] DSS Where to apply for food stamps, Medicaid and utility assistance. 670-043-2465 ? RCATS: Transportation to medical appointments. 410-510-8569 ? Social Security Administration: May apply for disability if have a Stage IV cancer. 918-530-3267 640-589-9985 ? [employer], Disability and Transit Services: Assists with nutrition, care and transit needs. [place] Support Programs: @10RELATIVEDAYS @ > Cancer Support Group  2nd Tuesday of the month 1pm-2pm, Journey Room  > Creative Journey  3rd Tuesday of the month 1130am-1pm, Journey Room  > Look Good Feel Better  1st Wednesday of the month 10am-12 noon, Journey Room (Call [place] to register [phone])

## 2016-04-16 ENCOUNTER — Ambulatory Visit (INDEPENDENT_AMBULATORY_CARE_PROVIDER_SITE_OTHER): Payer: PPO | Admitting: Family Medicine

## 2016-04-16 ENCOUNTER — Encounter: Payer: Self-pay | Admitting: Family Medicine

## 2016-04-16 VITALS — BP 112/70 | HR 91 | Temp 98.6°F | Resp 18 | Ht 72.0 in | Wt 235.6 lb

## 2016-04-16 DIAGNOSIS — J0100 Acute maxillary sinusitis, unspecified: Secondary | ICD-10-CM | POA: Diagnosis not present

## 2016-04-16 DIAGNOSIS — J029 Acute pharyngitis, unspecified: Secondary | ICD-10-CM

## 2016-04-16 LAB — POCT RAPID STREP A (OFFICE): Rapid Strep A Screen: NEGATIVE

## 2016-04-16 MED ORDER — AMOXICILLIN-POT CLAVULANATE 875-125 MG PO TABS: 1.0000 | ORAL_TABLET | Freq: Two times a day (BID) | ORAL | Status: DC

## 2016-04-16 NOTE — Progress Notes (Signed)
Name: Bryan Flowers   MRN: [id number]    DOB: 08/25/1961   Date:04/16/2016       Progress Note  Subjective  Chief Complaint  Chief Complaint  Patient presents with  . Sinusitis  . Sore Throat    Sinusitis This is a recurrent problem. The maximum temperature recorded prior to his arrival was 100.4 - 100.9 F. Associated symptoms include coughing, neck pain, sinus pressure and a sore throat. Pertinent negatives include no swollen glands. Past treatments include acetaminophen.  Sore Throat  This is a new problem. The current episode started in the past 7 days. The maximum temperature recorded prior to his arrival was 100.4 - 100.9 F (has been experiencing a 'low grade fever'). Associated symptoms include coughing, neck pain and trouble swallowing. Pertinent negatives include no swollen glands. He has had exposure to strep. He has tried acetaminophen for the symptoms.     Past Medical History  Diagnosis Date  . Depression   . Diabetes mellitus without complication ([place])   . Hyperlipidemia   . Hypertension   . Bipolar 1 disorder, manic, moderate ([place])   . GERD (gastroesophageal reflux disease)   . Insomnia   . Parkinson disease [facility])     Past Surgical History  Procedure Laterality Date  . Foot fracture surgery Right   . Esophagogastroduodenoscopy Left 03/16/2016    Procedure: ESOPHAGOGASTRODUODENOSCOPY (EGD);  Surgeon: Manus Gunning, MD;  Location: [place];  Service: Gastroenterology;  Laterality: Left;    Family History  Problem Relation Age of Onset  . Cancer Mother   . Heart disease Father     Social History   Social History  . Marital Status: Married    Spouse Name: N/A  . Number of Children: N/A  . Years of Education: N/A   Occupational History  . Not on file.   Social History Main Topics  . Smoking status: Never Smoker   . Smokeless tobacco: Not on file  . Alcohol Use: No  . Drug Use: No  . Sexual Activity:    Partners: Female   Other  Topics Concern  . Not on file   Social History Narrative     Current outpatient prescriptions:  .  acetaminophen (TYLENOL) 500 MG tablet, Take 1,000 mg by mouth every 6 (six) hours as needed (pain)., Disp: , Rfl:  .  Armodafinil 150 MG tablet, Take 150 mg by mouth daily. Nuvigil, Disp: , Rfl:  .  b complex vitamins capsule, Take 1 capsule by mouth daily., Disp: , Rfl:  .  carbidopa-levodopa (SINEMET CR) 50-200 MG per tablet, Take 1 tablet by mouth 3 (three) times daily. , Disp: , Rfl:  .  DULoxetine (CYMBALTA) 60 MG capsule, Take 120 mg by mouth at bedtime. , Disp: , Rfl:  .  Exenatide ER 2 MG PEN, Inject 2 mg into the skin once a week., Disp: 1 each, Rfl: 11 .  fluticasone (FLONASE) 50 MCG/ACT nasal spray, Place 2 sprays into both nostrils daily., Disp: 16 g, Rfl: 0 .  glyBURIDE-metformin (GLUCOVANCE) 2.5-500 MG tablet, TAKE 2 TABLETS BY MOUTH TWICE DAILY WITH MEALS, Disp: 120 tablet, Rfl: 0 .  insulin detemir (LEVEMIR) 100 UNIT/ML injection, Inject 0.18 mLs (18 Units total) into the skin at bedtime., Disp: 10 mL, Rfl: 1 .  lamoTRIgine (LAMICTAL) 200 MG tablet, Take 200 mg by mouth at bedtime. , Disp: , Rfl:  .  LORazepam (ATIVAN) 0.5 MG tablet, Take 0.5 mg by mouth 3 (three) times daily as needed  for anxiety. , Disp: , Rfl:  .  pantoprazole (PROTONIX) 40 MG tablet, Take 1 tablet (40 mg total) by mouth 2 (two) times daily. X 14 days, then once daily, Disp: 60 tablet, Rfl: 1 .  propranolol ER (INDERAL LA) 60 MG 24 hr capsule, Take 60 mg by mouth at bedtime. , Disp: , Rfl:  .  QUEtiapine (SEROQUEL) 300 MG tablet, Take 300 mg by mouth at bedtime., Disp: , Rfl: 5 .  quinapril-hydrochlorothiazide (ACCURETIC) 20-12.5 MG tablet, Take 2 tablets by mouth daily., Disp: 180 tablet, Rfl: 0 .  rOPINIRole (REQUIP) 1 MG tablet, Take 1 mg by mouth at bedtime., Disp: , Rfl:  .  simvastatin (ZOCOR) 40 MG tablet, Take 1 tablet (40 mg total) by mouth at bedtime., Disp: 90 tablet, Rfl: 1  No Known  Allergies   Review of Systems  HENT: Positive for sinus pressure, sore throat and trouble swallowing.   Respiratory: Positive for cough.   Musculoskeletal: Positive for neck pain.     Objective  Filed Vitals:   04/16/16 1438  BP: 112/70  Pulse: 91  Temp: 98.6 F (37 C)  TempSrc: Oral  Resp: 18  Height: 6' (1.829 m)  Weight: 235 lb 9.6 oz (106.867 kg)  SpO2: 97%    Physical Exam  Constitutional: He is well-developed, well-nourished, and in no distress.  HENT:  Head: Normocephalic and atraumatic.  Right Ear: Tympanic membrane and ear canal normal.  Left Ear: Tympanic membrane and ear canal normal.  Nose: Right sinus exhibits maxillary sinus tenderness and frontal sinus tenderness. Left sinus exhibits maxillary sinus tenderness and frontal sinus tenderness.  Mouth/Throat: Posterior oropharyngeal erythema present.  Cardiovascular: Normal rate, regular rhythm and normal heart sounds.   No murmur heard. Pulmonary/Chest: Effort normal and breath sounds normal. He has no wheezes.  Nursing note and vitals reviewed.     Assessment & Plan  1. Sore throat Rapid strep is negative - POCT rapid strep A  2. Subacute maxillary sinusitis Was treated with Z-Pak in May 2017, felt like his symptoms never fully resolved and now it's worse. Start on high-dose amoxicillin and will follow-up - amoxicillin-clavulanate (AUGMENTIN) 875-125 MG tablet; Take 1 tablet by mouth 2 (two) times daily.  Dispense: 20 tablet; Refill: 0   Camaryn Lumbert Asad A. [facility] 04/16/2016 2:44 PM

## 2016-04-20 ENCOUNTER — Encounter: Payer: Self-pay | Admitting: Gastroenterology

## 2016-04-20 ENCOUNTER — Ambulatory Visit (INDEPENDENT_AMBULATORY_CARE_PROVIDER_SITE_OTHER): Payer: PPO | Admitting: Gastroenterology

## 2016-04-20 ENCOUNTER — Other Ambulatory Visit (INDEPENDENT_AMBULATORY_CARE_PROVIDER_SITE_OTHER): Payer: PPO

## 2016-04-20 VITALS — BP 124/74 | HR 76 | Ht 71.0 in | Wt 235.8 lb

## 2016-04-20 DIAGNOSIS — K279 Peptic ulcer, site unspecified, unspecified as acute or chronic, without hemorrhage or perforation: Secondary | ICD-10-CM | POA: Diagnosis not present

## 2016-04-20 DIAGNOSIS — Z1211 Encounter for screening for malignant neoplasm of colon: Secondary | ICD-10-CM | POA: Diagnosis not present

## 2016-04-20 DIAGNOSIS — D509 Iron deficiency anemia, unspecified: Secondary | ICD-10-CM

## 2016-04-20 MED ORDER — NA SULFATE-K SULFATE-MG SULF 17.5-3.13-1.6 GM/177ML PO SOLN: ORAL | Status: DC

## 2016-04-20 NOTE — Patient Instructions (Signed)
   Your physician has requested that you go to the basement for the following lab work before leaving today: CBC/diff   You have been scheduled for an endoscopy and  Colonoscopy. Please follow written instructions given to you at your visit today. If you use inhalers (even only as needed), please bring them with you on the day of your procedure. Your physician has requested that you go to www.[email or web address] and enter the access code given to you at your visit today. This web site gives a general overview about your procedure. However, you should still follow specific instructions given to you by our office regarding your preparation for the procedure.   I appreciate the opportunity to care for you.

## 2016-04-20 NOTE — Progress Notes (Signed)
HPI :   55 y/o male who presented with melena and anemia in early June and admitted. EGD showed multiple (3) small gastric ulcers and a 49m duodenal ulcer, all clean based without high risk stigmata for bleeding. He required multiple units of PRBCs transfusion for Hgb of 6s with iron deficiency. This was in the setting of NSAID use which was thought to be the likely etiology. He had biopsies of the stomach show no evidence of H pylori. He has not tolerated oral iron and was given IV iron infusions which he has received 2 of thus far. He has been taking protonix initially twice daily, and now once per day. He has not had any further blood in the stools. He denies any abdominal pains. He has been avoid NSAIDs and taking tylenol.   In general he thinks he is doing much better since the hospitalization. He has some weakness from time to time when he exerts himself but in general feels much improved.   No blood in the stools. No constipation or diarrhea. He has never had a prior colonoscopy. No FH of known colon cancer. Mother had breast cancer.    Past Medical History  Diagnosis Date  . Depression   . Diabetes mellitus without complication ([place]   . Hyperlipidemia   . Hypertension   . Bipolar 1 disorder, manic, moderate ([place]   . GERD (gastroesophageal reflux disease)   . Insomnia   . Parkinson disease ([place]   . Peptic ulcer disease with hemorrhage   . Anemia     iron deficiency     Past Surgical History  Procedure Laterality Date  . Foot fracture surgery Right   . Esophagogastroduodenoscopy Left 03/16/2016    Procedure: ESOPHAGOGASTRODUODENOSCOPY (EGD);  Surgeon: SManus Gunning MD;  [place]  Service: Gastroenterology;  Laterality: Left;   Family History  Problem Relation Age of Onset  . Cancer Mother   . Heart disease Father    Social History  Substance Use Topics  . Smoking status: Never Smoker   . Smokeless tobacco: Never Used  . Alcohol Use: No    Current Outpatient Prescriptions  Medication Sig Dispense Refill  . acetaminophen (TYLENOL) 500 MG tablet Take 1,000 mg by mouth every 6 (six) hours as needed (pain).    .Marland Kitchenamoxicillin (AMOXIL) 875 MG tablet Take 875 mg by mouth 2 (two) times daily.    .Marland Kitchenamoxicillin-clavulanate (AUGMENTIN) 875-125 MG tablet Take 1 tablet by mouth 2 (two) times daily. 20 tablet 0  . Armodafinil 150 MG tablet Take 150 mg by mouth daily. Nuvigil    . b complex vitamins capsule Take 1 capsule by mouth daily.    . carbidopa-levodopa (SINEMET CR) 50-200 MG per tablet Take 1 tablet by mouth 3 (three) times daily.     . DULoxetine (CYMBALTA) 60 MG capsule Take 120 mg by mouth at bedtime.     . Exenatide ER 2 MG PEN Inject 2 mg into the skin once a week. 1 each 11  . fluticasone (FLONASE) 50 MCG/ACT nasal spray Place 2 sprays into both nostrils daily. 16 g 0  . glyBURIDE-metformin (GLUCOVANCE) 2.5-500 MG tablet TAKE 2 TABLETS BY MOUTH TWICE DAILY WITH MEALS 120 tablet 0  . insulin detemir (LEVEMIR) 100 UNIT/ML injection Inject 0.18 mLs (18 Units total) into the skin at bedtime. 10 mL 1  . lamoTRIgine (LAMICTAL) 200 MG tablet Take 200 mg by mouth at bedtime.     .Marland KitchenLORazepam (ATIVAN) 0.5 MG tablet  Take 0.5 mg by mouth 3 (three) times daily as needed for anxiety.     . pantoprazole (PROTONIX) 40 MG tablet Take 1 tablet (40 mg total) by mouth 2 (two) times daily. X 14 days, then once daily 60 tablet 1  . propranolol ER (INDERAL LA) 60 MG 24 hr capsule Take 60 mg by mouth at bedtime.     Marland Kitchen QUEtiapine (SEROQUEL) 300 MG tablet Take 300 mg by mouth at bedtime.  5  . quinapril-hydrochlorothiazide (ACCURETIC) 20-12.5 MG tablet Take 2 tablets by mouth daily. 180 tablet 0  . rOPINIRole (REQUIP) 1 MG tablet Take 1 mg by mouth at bedtime.    . simvastatin (ZOCOR) 40 MG tablet Take 1 tablet (40 mg total) by mouth at bedtime. 90 tablet 1  . Na Sulfate-K Sulfate-Mg Sulf (SUPREP BOWEL PREP KIT) 17.5-3.13-1.6 GM/180ML SOLN Use as  directed 2 Bottle 0   No current facility-administered medications for this visit.   No Known Allergies   Review of Systems: All systems reviewed and negative except where noted in HPI.    CBC Latest Ref Rng 03/17/2016 03/16/2016 03/15/2016  WBC 4.0 - 10.5 K/uL - 8.2 9.2  Hemoglobin 13.0 - 17.0 g/dL 8.8(L) 8.1(L) 6.4(LL)  Hematocrit 39.0 - 52.0 % - 24.2(L) 19.7(L)  Platelets 150 - 400 K/uL - 180 233      Lab Results  Component Value Date   IRON 18* 03/27/2016   TIBC 540* 03/27/2016   FERRITIN 10* 03/27/2016      Physical Exam: BP 124/74 mmHg  Pulse 76  Ht 5' 11"  (1.803 m)  Wt 235 lb 12.8 oz (106.958 kg)  BMI 32.90 kg/m2 Constitutional: Pleasant,well-developed, male in no acute distress. HEENT: Normocephalic and atraumatic.  No scleral icterus. Neck supple.  Cardiovascular: Normal rate, regular rhythm.  Pulmonary/chest: Effort normal and breath sounds normal. No wheezing, rales or rhonchi. Abdominal: Soft, nondistended, nontender. Bowel sounds active throughout. There are no masses palpable. No hepatomegaly. Extremities: no edema Lymphadenopathy: No cervical adenopathy noted. Neurological: Alert and oriented to person place and time. Skin: Skin is warm and dry. No rashes noted. Psychiatric: Normal mood and affect. Behavior is normal.   ASSESSMENT AND PLAN: 56 y/o male who initially presented with symptoms of upper GI bleeding and iron deficiency anemia, found to have gastric ulcers and duodenal ulcer. H pylori negative. PUD likely secondary to NSAIDs. He had IV iron given his intolerance of oral iron. Will repeat CBC now to ensure interval improvement. Recommend a repeat EGD next month to ensure appropriate healing of gastric ulcers. He otherwise has not had any prior colon cancer screening and recommend optical colonoscopy for his screening exam. I discussed risks / benefits of endoscopy and he wished to proceed. He will continue protonix once daily until EGD and  continue to avoid NSAIDs. All questions answered.     [place], MD [facility] Gastroenterology Pager [phone]

## 2016-04-21 ENCOUNTER — Encounter: Payer: Self-pay | Admitting: Gastroenterology

## 2016-04-21 LAB — CBC WITH DIFFERENTIAL/PLATELET
BASOS PCT: 1.2 % (ref 0.0–3.0)
Basophils Absolute: 0.1 10*3/uL (ref 0.0–0.1)
EOS PCT: 1.3 % (ref 0.0–5.0)
Eosinophils Absolute: 0.1 10*3/uL (ref 0.0–0.7)
HCT: 37.5 % — ABNORMAL LOW (ref 39.0–52.0)
HEMOGLOBIN: 12.6 g/dL — AB (ref 13.0–17.0)
LYMPHS ABS: 1.5 10*3/uL (ref 0.7–4.0)
Lymphocytes Relative: 23.4 % (ref 12.0–46.0)
MCHC: 33.6 g/dL (ref 30.0–36.0)
MCV: 84.9 fl (ref 78.0–100.0)
MONO ABS: 0.5 10*3/uL (ref 0.1–1.0)
MONOS PCT: 8.3 % (ref 3.0–12.0)
NEUTROS PCT: 65.8 % (ref 43.0–77.0)
Neutro Abs: 4.2 10*3/uL (ref 1.4–7.7)
Platelets: 369 10*3/uL (ref 150.0–400.0)
RBC: 4.42 Mil/uL (ref 4.22–5.81)
RDW: 20.6 % — AB (ref 11.5–15.5)
WBC: 6.3 10*3/uL (ref 4.0–10.5)

## 2016-04-22 ENCOUNTER — Encounter (HOSPITAL_COMMUNITY): Payer: PPO

## 2016-04-22 MED ORDER — SODIUM CHLORIDE 0.9 % IV SOLN: Freq: Once | INTRAVENOUS | Status: DC

## 2016-04-22 MED ORDER — SODIUM CHLORIDE 0.9 % IJ SOLN: 3.0000 mL | Freq: Once | INTRAMUSCULAR | Status: DC | PRN

## 2016-04-22 MED ORDER — HEPARIN SOD (PORK) LOCK FLUSH 100 UNIT/ML IV SOLN: 500.0000 [IU] | Freq: Once | INTRAVENOUS | Status: DC | PRN

## 2016-04-22 MED ORDER — SODIUM CHLORIDE 0.9 % IJ SOLN: 10.0000 mL | INTRAMUSCULAR | Status: DC | PRN

## 2016-04-22 MED ORDER — HEPARIN SOD (PORK) LOCK FLUSH 100 UNIT/ML IV SOLN: 250.0000 [IU] | Freq: Once | INTRAVENOUS | Status: DC | PRN

## 2016-04-22 MED ORDER — ALTEPLASE 2 MG IJ SOLR: 2.0000 mg | Freq: Once | INTRAMUSCULAR | Status: DC | PRN

## 2016-04-24 ENCOUNTER — Other Ambulatory Visit: Payer: Self-pay | Admitting: Family Medicine

## 2016-05-07 DIAGNOSIS — F3181 Bipolar II disorder: Secondary | ICD-10-CM | POA: Diagnosis not present

## 2016-05-07 DIAGNOSIS — G2581 Restless legs syndrome: Secondary | ICD-10-CM | POA: Diagnosis not present

## 2016-05-07 DIAGNOSIS — G4726 Circadian rhythm sleep disorder, shift work type: Secondary | ICD-10-CM | POA: Diagnosis not present

## 2016-05-18 ENCOUNTER — Ambulatory Visit (INDEPENDENT_AMBULATORY_CARE_PROVIDER_SITE_OTHER): Payer: PPO | Admitting: Family Medicine

## 2016-05-18 ENCOUNTER — Encounter: Payer: Self-pay | Admitting: Family Medicine

## 2016-05-18 ENCOUNTER — Encounter: Payer: Self-pay | Admitting: Gastroenterology

## 2016-05-18 VITALS — BP 122/71 | HR 97 | Temp 98.6°F | Resp 16 | Ht 71.0 in | Wt 235.8 lb

## 2016-05-18 DIAGNOSIS — J029 Acute pharyngitis, unspecified: Secondary | ICD-10-CM | POA: Diagnosis not present

## 2016-05-18 DIAGNOSIS — R5383 Other fatigue: Secondary | ICD-10-CM | POA: Diagnosis not present

## 2016-05-18 NOTE — Progress Notes (Signed)
Name: Bryan Flowers   MRN: [id number]    DOB: [birth year]   Date:05/18/2016       Progress Note  Subjective  Chief Complaint  Chief Complaint  Patient presents with  . Sore Throat  . Fatigue    Sore Throat   This is a new problem. Episode onset: 2 weeks ago. There has been no fever (Has experienced a 'low grade fever'', Tmax 101 F.). Associated symptoms include congestion, coughing, headaches and trouble swallowing. Pertinent negatives include no abdominal pain, ear discharge, ear pain or vomiting.    Past Medical History:  Diagnosis Date  . Anemia    iron deficiency  . Bipolar 1 disorder, manic, moderate ([place])   . Depression   . Diabetes mellitus without complication ([place])   . GERD (gastroesophageal reflux disease)   . Hyperlipidemia   . Hypertension   . Insomnia   . Parkinson disease ([place])   . Peptic ulcer disease with hemorrhage     Past Surgical History:  Procedure Laterality Date  . ESOPHAGOGASTRODUODENOSCOPY Left 03/16/2016   Procedure: ESOPHAGOGASTRODUODENOSCOPY (EGD);  Surgeon: Manus Gunning, MD;  Location: [place];  Service: Gastroenterology;  Laterality: Left;  . FOOT FRACTURE SURGERY Right     Family History  Problem Relation Age of Onset  . Cancer Mother   . Heart disease Father     Social History   Social History  . Marital status: Married    Spouse name: N/A  . Number of children: N/A  . Years of education: N/A   Occupational History  . Not on file.   Social History Main Topics  . Smoking status: Never Smoker  . Smokeless tobacco: Never Used  . Alcohol use No  . Drug use: No  . Sexual activity: Yes    Partners: Female   Other Topics Concern  . Not on file   Social History Narrative  . No narrative on file     Current Outpatient Prescriptions:  .  acetaminophen (TYLENOL) 500 MG tablet, Take 1,000 mg by mouth every 6 (six) hours as needed (pain)., Disp: , Rfl:  .  Armodafinil 150 MG tablet, Take 150 mg by mouth daily.  Nuvigil, Disp: , Rfl:  .  b complex vitamins capsule, Take 1 capsule by mouth daily., Disp: , Rfl:  .  carbidopa-levodopa (SINEMET CR) 50-200 MG per tablet, Take 1 tablet by mouth 3 (three) times daily. , Disp: , Rfl:  .  DULoxetine (CYMBALTA) 60 MG capsule, Take 120 mg by mouth at bedtime. , Disp: , Rfl:  .  Exenatide ER 2 MG PEN, Inject 2 mg into the skin once a week., Disp: 1 each, Rfl: 11 .  fluticasone (FLONASE) 50 MCG/ACT nasal spray, Place 2 sprays into both nostrils daily., Disp: 16 g, Rfl: 0 .  glyBURIDE-metformin (GLUCOVANCE) 2.5-500 MG tablet, TAKE 2 TABLETS BY MOUTH TWICE DAILY WITH MEALS, Disp: 120 tablet, Rfl: 0 .  insulin detemir (LEVEMIR) 100 UNIT/ML injection, Inject 0.18 mLs (18 Units total) into the skin at bedtime., Disp: 10 mL, Rfl: 1 .  lamoTRIgine (LAMICTAL) 200 MG tablet, Take 200 mg by mouth at bedtime. , Disp: , Rfl:  .  LORazepam (ATIVAN) 0.5 MG tablet, Take 0.5 mg by mouth 3 (three) times daily as needed for anxiety. , Disp: , Rfl:  .  Na Sulfate-K Sulfate-Mg Sulf (SUPREP BOWEL PREP KIT) 17.5-3.13-1.6 GM/180ML SOLN, Use as directed, Disp: 2 Bottle, Rfl: 0 .  pantoprazole (PROTONIX) 40 MG tablet, Take 1 tablet (40  mg total) by mouth 2 (two) times daily. X 14 days, then once daily, Disp: 60 tablet, Rfl: 1 .  propranolol ER (INDERAL LA) 60 MG 24 hr capsule, Take 60 mg by mouth at bedtime. , Disp: , Rfl:  .  QUEtiapine (SEROQUEL) 300 MG tablet, Take 300 mg by mouth at bedtime., Disp: , Rfl: 5 .  quinapril-hydrochlorothiazide (ACCURETIC) 20-12.5 MG tablet, Take 2 tablets by mouth daily., Disp: 180 tablet, Rfl: 0 .  rOPINIRole (REQUIP) 1 MG tablet, Take 1 mg by mouth at bedtime., Disp: , Rfl:  .  simvastatin (ZOCOR) 40 MG tablet, TAKE 1 TABLET BY MOUTH AT BEDTIME, Disp: 90 tablet, Rfl: 0 .  amoxicillin (AMOXIL) 875 MG tablet, Take 875 mg by mouth 2 (two) times daily., Disp: , Rfl:  .  amoxicillin-clavulanate (AUGMENTIN) 875-125 MG tablet, Take 1 tablet by mouth 2 (two) times  daily. (Patient not taking: Reported on 05/18/2016), Disp: 20 tablet, Rfl: 0 No current facility-administered medications for this visit.   Facility-Administered Medications Ordered in Other Visits:  .  0.9 %  sodium chloride infusion, , Intravenous, Once, Brunetta Genera, MD .  alteplase (CATHFLO ACTIVASE) injection 2 mg, 2 mg, Intracatheter, Once PRN, Brunetta Genera, MD .  heparin lock flush 100 unit/mL, 500 Units, Intracatheter, Once PRN, Brunetta Genera, MD .  heparin lock flush 100 unit/mL, 250 Units, Intracatheter, Once PRN, Brunetta Genera, MD .  sodium chloride 0.9 % injection 10 mL, 10 mL, Intracatheter, PRN, Brunetta Genera, MD .  sodium chloride 0.9 % injection 3 mL, 3 mL, Intravenous, Once PRN, Brunetta Genera, MD  No Known Allergies   Review of Systems  Constitutional: Positive for fever. Negative for chills.  HENT: Positive for congestion and trouble swallowing. Negative for ear discharge and ear pain.   Respiratory: Positive for cough.   Gastrointestinal: Positive for heartburn and nausea. Negative for abdominal pain and vomiting.  Neurological: Positive for headaches.      Objective  Vitals:   05/18/16 1504  BP: 122/71  Pulse: 97  Resp: 16  Temp: 98.6 F (37 C)  TempSrc: Oral  SpO2: 97%  Weight: 235 lb 12.8 oz (107 kg)  Height: 5' 11"  (1.803 m)    Physical Exam  HENT:  Right Ear: Tympanic membrane and ear canal normal.  Left Ear: Tympanic membrane and ear canal normal.  Nose: No sinus tenderness. Right sinus exhibits no maxillary sinus tenderness and no frontal sinus tenderness. Left sinus exhibits no maxillary sinus tenderness and no frontal sinus tenderness.  Mouth/Throat: No posterior oropharyngeal erythema.  Nasal turbinate hypertrophy, mucosal inflammation  Neck: Neck supple. No thyroid mass present.  Cardiovascular: Normal rate and regular rhythm.   Pulmonary/Chest: Effort normal and breath sounds normal.  Nursing note and  vitals reviewed.    Assessment & Plan  1. Sore throat No evidence for any infection, likely etiology in this setting is acid reflux with posterior laryngeal inflammation. Patient is on PPI therapy and is being followed by gastroenterology.  2. Other fatigue  - TSH   Izamar Linden Asad A. [facility] 05/18/2016 3:36 PM

## 2016-05-19 LAB — TSH: TSH: 1.41 mIU/L (ref 0.40–4.50)

## 2016-05-22 ENCOUNTER — Ambulatory Visit (HOSPITAL_COMMUNITY): Payer: PPO

## 2016-05-30 IMAGING — CR DG LUMBAR SPINE 2-3V
1 series · 3 of 3 positions shown · non-contrast
Comparison: None.

CLINICAL DATA: Low back pain. Pain radiating to right hip. Symptoms
for 2 to 3 months. No known injury.

EXAM:
LUMBAR SPINE - 2-3 VIEW

[Series 1: kdxr lumbar spine ap and lateral · 0.14mm/px · 3 of 3 slices shown]
[im 1/3]
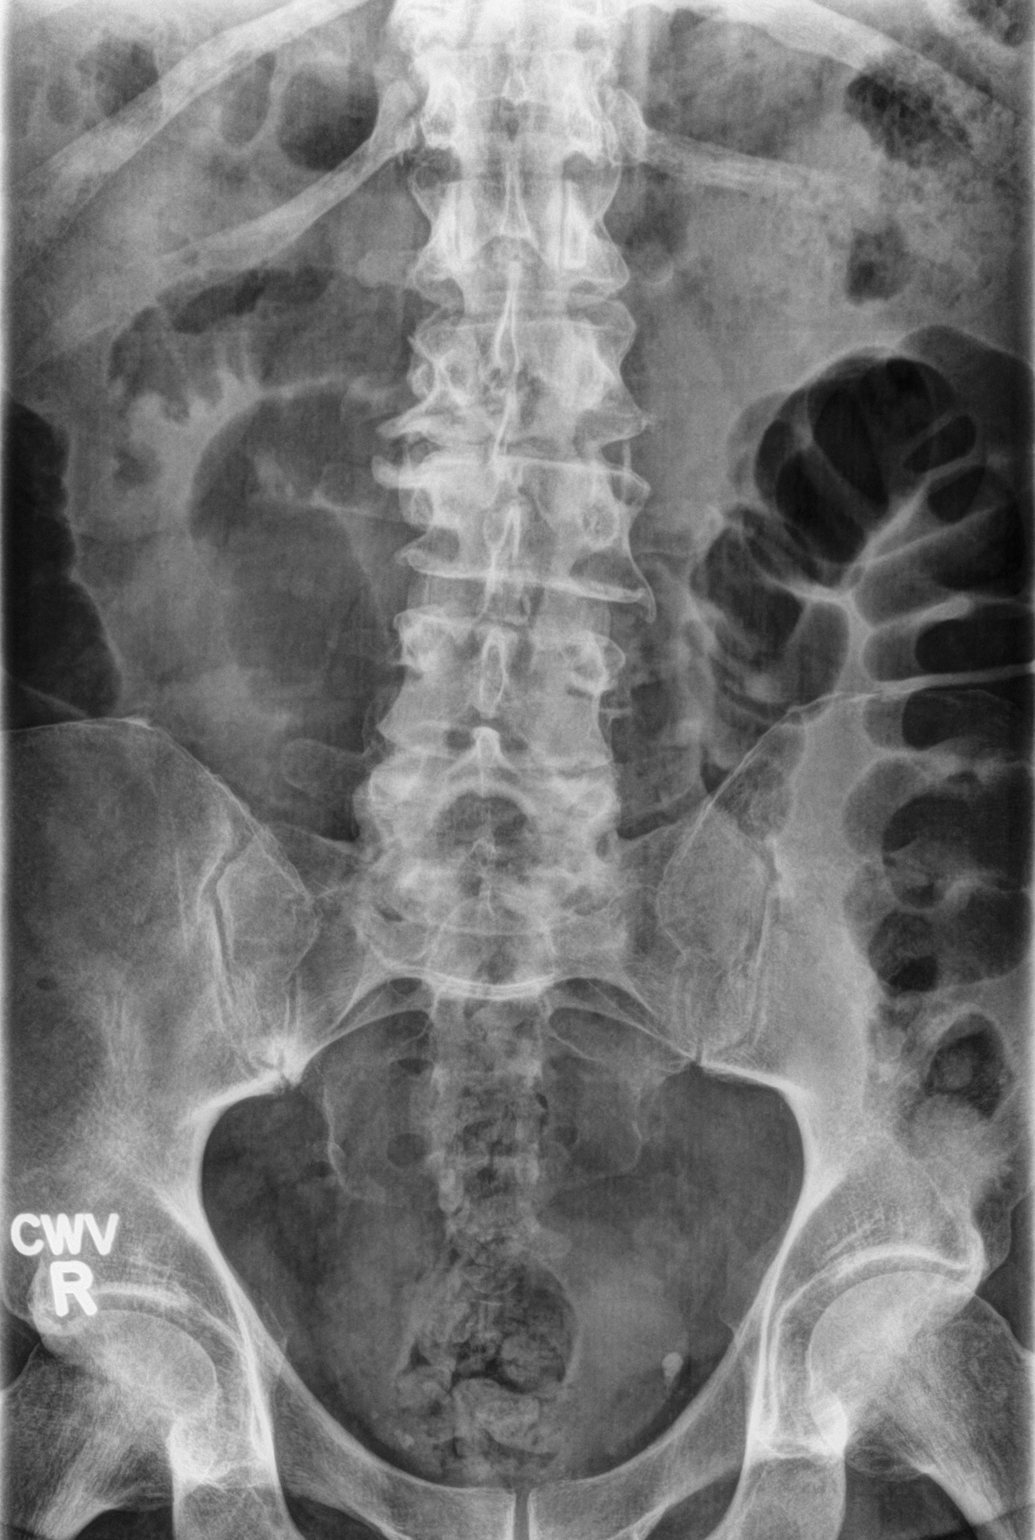
[im 2/3]
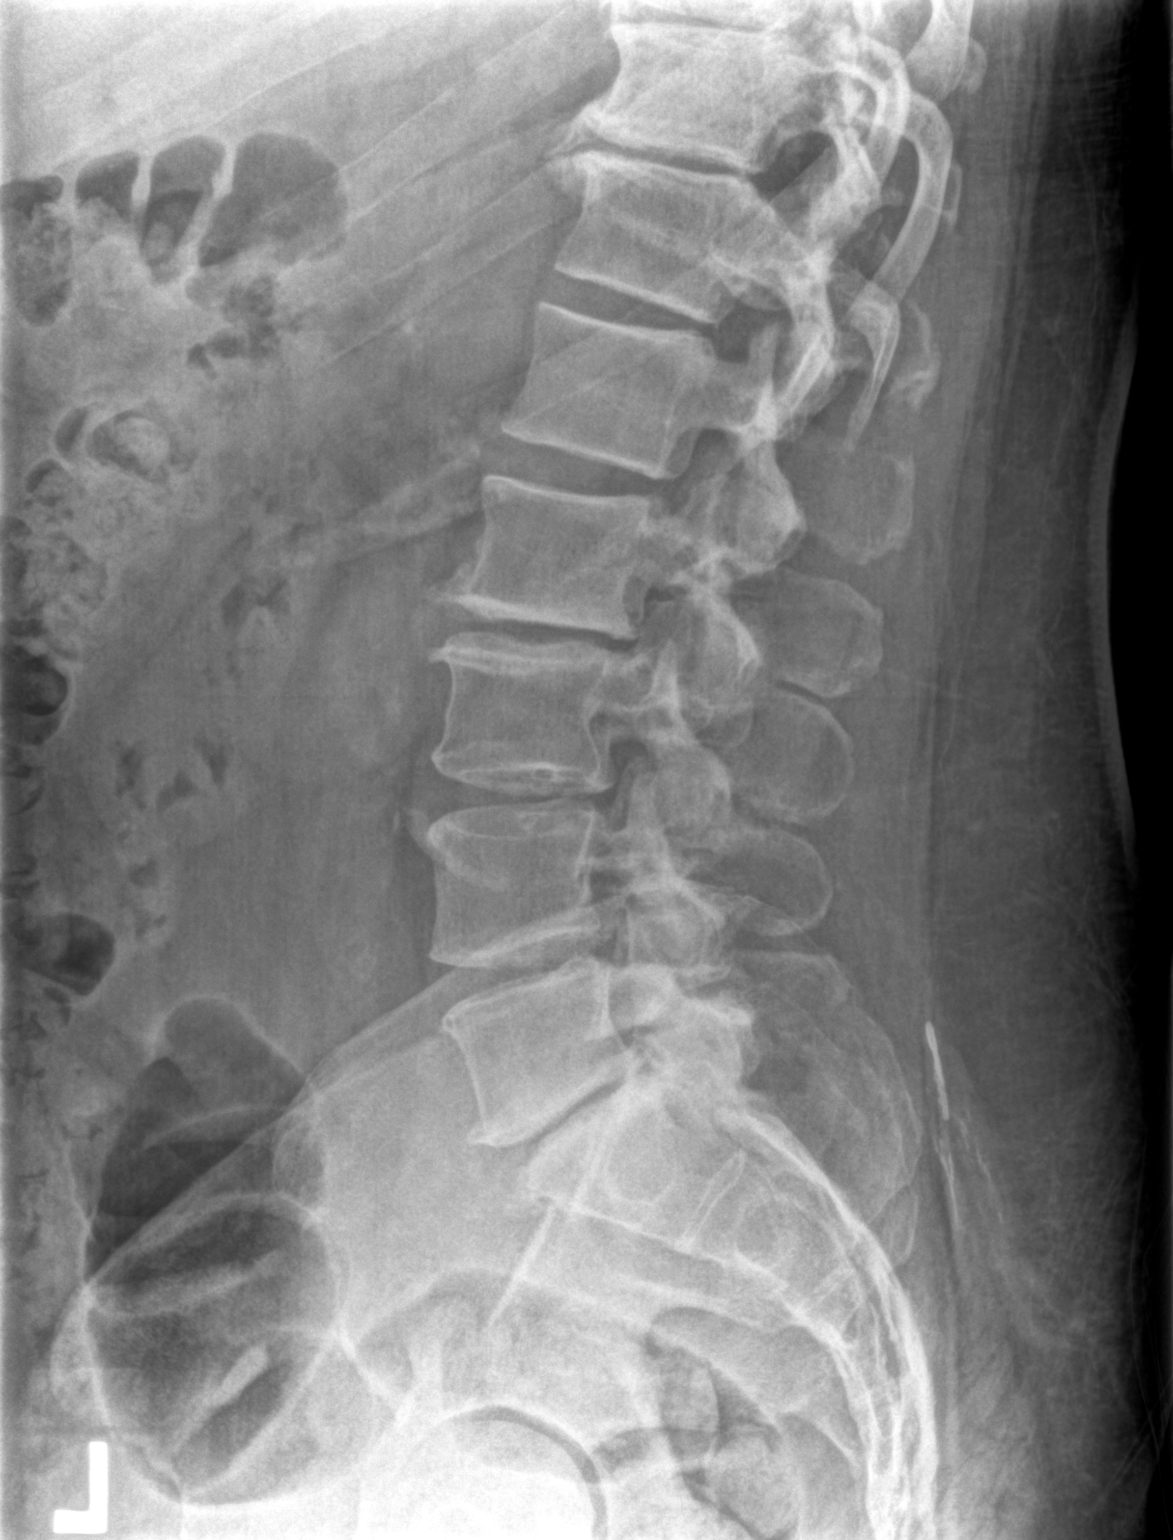
[im 3/3]
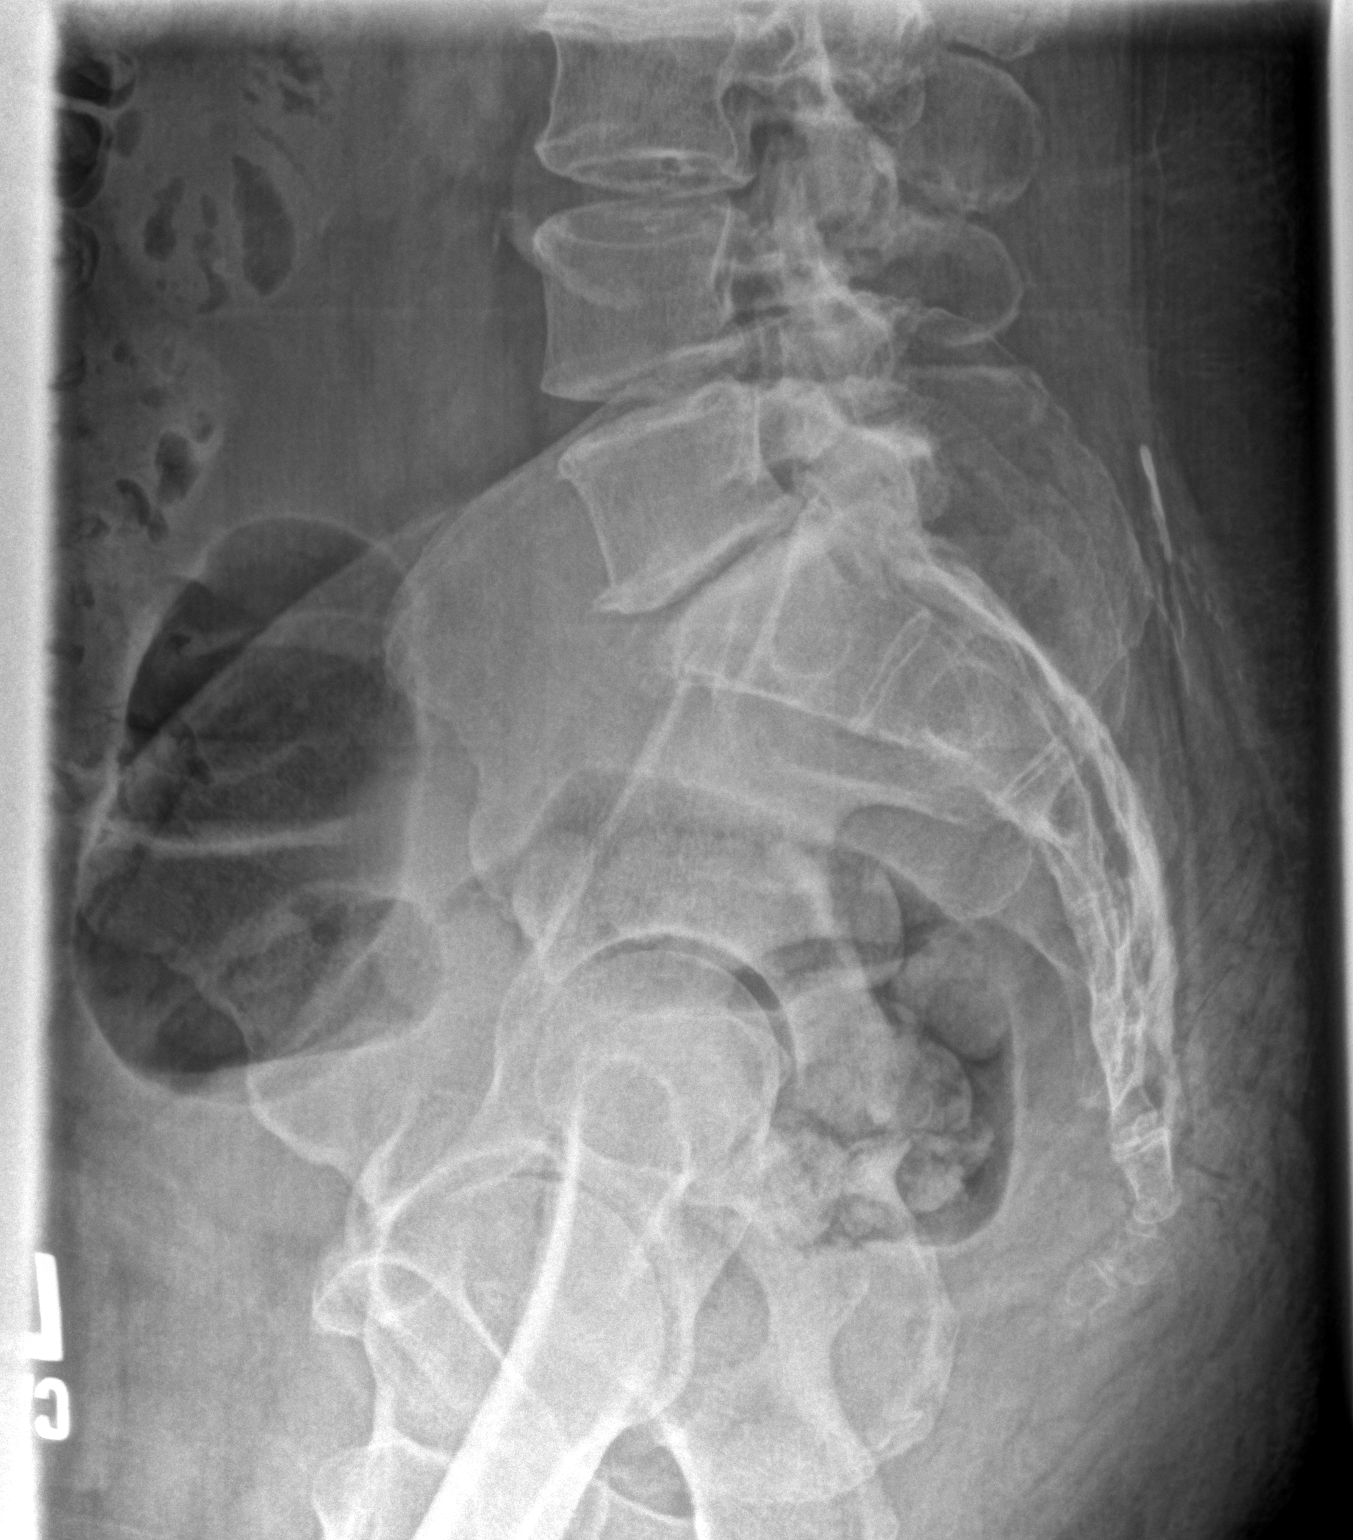

[3 of 3 positions shown; findings below may reference images not displayed]

FINDINGS: Diffuse degenerative changes with multilevel disc degeneration
endplate osteophyte formation. No acute abnormality identified. Mild
scoliosis concave right. Aortoiliac atherosclerotic vascular
disease.
IMPRESSION: Diffuse degenerative change lumbar spine with scoliosis concave
right. No acute abnormality.

## 2016-06-01 ENCOUNTER — Ambulatory Visit (AMBULATORY_SURGERY_CENTER): Payer: PPO | Admitting: Gastroenterology

## 2016-06-01 ENCOUNTER — Encounter: Payer: Self-pay | Admitting: Gastroenterology

## 2016-06-01 VITALS — BP 136/79 | HR 78 | Temp 97.8°F | Resp 20 | Ht 71.0 in | Wt 235.0 lb

## 2016-06-01 DIAGNOSIS — D125 Benign neoplasm of sigmoid colon: Secondary | ICD-10-CM | POA: Diagnosis not present

## 2016-06-01 DIAGNOSIS — D123 Benign neoplasm of transverse colon: Secondary | ICD-10-CM | POA: Diagnosis not present

## 2016-06-01 DIAGNOSIS — K2970 Gastritis, unspecified, without bleeding: Secondary | ICD-10-CM

## 2016-06-01 DIAGNOSIS — D509 Iron deficiency anemia, unspecified: Secondary | ICD-10-CM

## 2016-06-01 DIAGNOSIS — K2990 Gastroduodenitis, unspecified, without bleeding: Secondary | ICD-10-CM

## 2016-06-01 DIAGNOSIS — I10 Essential (primary) hypertension: Secondary | ICD-10-CM | POA: Diagnosis not present

## 2016-06-01 DIAGNOSIS — E669 Obesity, unspecified: Secondary | ICD-10-CM | POA: Diagnosis not present

## 2016-06-01 DIAGNOSIS — Z1211 Encounter for screening for malignant neoplasm of colon: Secondary | ICD-10-CM

## 2016-06-01 DIAGNOSIS — E119 Type 2 diabetes mellitus without complications: Secondary | ICD-10-CM | POA: Diagnosis not present

## 2016-06-01 DIAGNOSIS — K279 Peptic ulcer, site unspecified, unspecified as acute or chronic, without hemorrhage or perforation: Secondary | ICD-10-CM | POA: Diagnosis not present

## 2016-06-01 DIAGNOSIS — G4733 Obstructive sleep apnea (adult) (pediatric): Secondary | ICD-10-CM | POA: Diagnosis not present

## 2016-06-01 DIAGNOSIS — Z8719 Personal history of other diseases of the digestive system: Secondary | ICD-10-CM

## 2016-06-01 LAB — GLUCOSE, CAPILLARY
GLUCOSE-CAPILLARY: 250 mg/dL — AB (ref 65–99)
Glucose-Capillary: 229 mg/dL — ABNORMAL HIGH (ref 65–99)

## 2016-06-01 MED ORDER — PANTOPRAZOLE SODIUM 40 MG PO TBEC: 40.0000 mg | DELAYED_RELEASE_TABLET | Freq: Every day | ORAL | 0 refills | Status: DC

## 2016-06-01 MED ORDER — SODIUM CHLORIDE 0.9 % IV SOLN: 500.0000 mL | INTRAVENOUS | Status: DC

## 2016-06-01 NOTE — Progress Notes (Signed)
To recovery, report to [place], RN, VSS 

## 2016-06-01 NOTE — Patient Instructions (Signed)
YOU HAD AN ENDOSCOPIC PROCEDURE TODAY AT [facility]:   Refer to the procedure report that was given to you for any specific questions about what was found during the examination.  If the procedure report does not answer your questions, please call your gastroenterologist to clarify.  If you requested that your care partner not be given the details of your procedure findings, then the procedure report has been included in a sealed envelope for you to review at your convenience later.  YOU SHOULD EXPECT: Some feelings of bloating in the abdomen. Passage of more gas than usual.  Walking can help get rid of the air that was put into your GI tract during the procedure and reduce the bloating. If you had a lower endoscopy (such as a colonoscopy or flexible sigmoidoscopy) you may notice spotting of blood in your stool or on the toilet paper. If you underwent a bowel prep for your procedure, you may not have a normal bowel movement for a few days.  Please Note:  You might notice some irritation and congestion in your nose or some drainage.  This is from the oxygen used during your procedure.  There is no need for concern and it should clear up in a day or so.  SYMPTOMS TO REPORT IMMEDIATELY:   Following lower endoscopy (colonoscopy or flexible sigmoidoscopy):  Excessive amounts of blood in the stool  Significant tenderness or worsening of abdominal pains  Swelling of the abdomen that is new, acute  Fever of 100F or higher   Following upper endoscopy (EGD)  Vomiting of blood or coffee ground material  New chest pain or pain under the shoulder blades  Painful or persistently difficult swallowing  New shortness of breath  Fever of 100F or higher  Black, tarry-looking stools  For urgent or emergent issues, a gastroenterologist can be reached at any hour by calling [phone].   DIET:  We do recommend a small meal at first, but then you may proceed to your regular diet.  Drink  plenty of fluids but you should avoid alcoholic beverages for 24 hours.  ACTIVITY:  You should plan to take it easy for the rest of today and you should NOT DRIVE or use heavy machinery until tomorrow (because of the sedation medicines used during the test).    FOLLOW UP: Our staff will call the number listed on your records the next business day following your procedure to check on you and address any questions or concerns that you may have regarding the information given to you following your procedure. If we do not reach you, we will leave a message.  However, if you are feeling well and you are not experiencing any problems, there is no need to return our call.  We will assume that you have returned to your regular daily activities without incident.  If any biopsies were taken you will be contacted by phone or by letter within the next 1-3 weeks.  Please call us at [phone] if you have not heard about the biopsies in 3 weeks.    SIGNATURES/CONFIDENTIALITY: You and/or your care partner have signed paperwork which will be entered into your electronic medical record.  These signatures attest to the fact that that the information above on your After Visit Summary has been reviewed and is understood.  Full responsibility of the confidentiality of this discharge information lies with you and/or your care-partner.  NO IBUPROFREN,ADVIL,NAPROXEN,OR OTHER NON STEROIDAL ANTI-INFLAMMATORY MEDICATIONS FOR 2 WEEKS  INFORMATION ON POLYPS,DIVERTICULOSIS ,AND HEMORRHOIDS GIVEN TO YOU TODAY

## 2016-06-01 NOTE — Op Note (Signed)
[place] Patient Name: Bryan Flowers Procedure Date: 06/01/2016 1:59 PM MRN: [id number] Endoscopist: Remo Lipps P. Havery Moros , MD Age: 55 Referring MD:  Date of Birth: May 10, 1961 Gender: Male Account #: [id number] Procedure:                Upper GI endoscopy Indications:              Follow-up of previously bleeding gastric ulcers /                            upper GI bleed, now on protonix Medicines:                Monitored Anesthesia Care Procedure:                Pre-Anesthesia Assessment:                           - Prior to the procedure, a History and Physical                            was performed, and patient medications and                            allergies were reviewed. The patient's tolerance of                            previous anesthesia was also reviewed. The risks                            and benefits of the procedure and the sedation                            options and risks were discussed with the patient.                            All questions were answered, and informed consent                            was obtained. Prior Anticoagulants: The patient has                            taken no previous anticoagulant or antiplatelet                            agents. ASA Grade Assessment: III - A patient with                            severe systemic disease. After reviewing the risks                            and benefits, the patient was deemed in                            satisfactory condition to undergo the procedure.  After obtaining informed consent, the endoscope was                            passed under direct vision. Throughout the                            procedure, the patient's blood pressure, pulse, and                            oxygen saturations were monitored continuously. The                            Model GIF-[id number]) scope was introduced                            through the  mouth, and advanced to the second part                            of duodenum. The upper GI endoscopy was                            accomplished without difficulty. The patient                            tolerated the procedure well. Scope In: Scope Out: Findings:                 Esophagogastric landmarks were identified: the                            Z-line was found at 39 cm, the gastroesophageal                            junction was found at 39 cm and the upper extent of                            the gastric folds was found at 41 cm from the                            incisors.                           A 2 cm hiatal hernia was present.                           The exam of the esophagus was otherwise normal.                           Patchy mildly erythematous mucosa without bleeding                            was found in the gastric antrum. Biopsies were                            taken  from the antrum and body with a cold forceps                            for Helicobacter pylori testing.                           The exam of the stomach was otherwise normal. The                            previously noted gastric ulcers have since healed.                           The duodenal bulb and second portion of the                            duodenum were normal. Complications:            No immediate complications. Estimated blood loss:                            Minimal. Estimated Blood Loss:     Estimated blood loss was minimal. Impression:               - Esophagogastric landmarks identified.                           - 2 cm hiatal hernia.                           - Erythematous mucosa in the antrum. Biopsied.                           - No evidence of gastric / duodenal ulcers -                            interval healing has occured.                           - Normal duodenal bulb and second portion of the                            duodenum. Recommendation:           -  Patient has a contact number available for                            emergencies. The signs and symptoms of potential                            delayed complications were discussed with the                            patient. Return to normal activities tomorrow.                            Written discharge instructions were provided to the  patient.                           - Resume previous diet.                           - Continue present medications.                           - Await pathology results. Remo Lipps P. Armbruster, MD 06/01/2016 2:52:04 PM This report has been signed electronically.

## 2016-06-01 NOTE — Progress Notes (Signed)
Called to room to assist during endoscopic procedure.  Patient ID and intended procedure confirmed with present staff. Received instructions for my participation in the procedure from the performing physician.  

## 2016-06-01 NOTE — Op Note (Signed)
[place] Patient Name: Bryan Flowers Procedure Date: 06/01/2016 1:59 PM MRN: [id number] Endoscopist: Remo Lipps P. Havery Moros , MD Age: 55 Referring MD:  Date of Birth: Jan 25, 1961 Gender: Male Account #: [id number] Procedure:                Colonoscopy Indications:              Screening for malignant neoplasm in the colon, This                            is the patient's first colonoscopy Medicines:                Monitored Anesthesia Care Procedure:                Pre-Anesthesia Assessment:                           - Prior to the procedure, a History and Physical                            was performed, and patient medications and                            allergies were reviewed. The patient's tolerance of                            previous anesthesia was also reviewed. The risks                            and benefits of the procedure and the sedation                            options and risks were discussed with the patient.                            All questions were answered, and informed consent                            was obtained. Prior Anticoagulants: The patient has                            taken no previous anticoagulant or antiplatelet                            agents. ASA Grade Assessment: III - A patient with                            severe systemic disease. After reviewing the risks                            and benefits, the patient was deemed in                            satisfactory condition to undergo the procedure.  After obtaining informed consent, the colonoscope                            was passed under direct vision. Throughout the                            procedure, the patient's blood pressure, pulse, and                            oxygen saturations were monitored continuously. The                            Model CF-[id number]) scope was introduced                            through the  anus and advanced to the the cecum,                            identified by appendiceal orifice and ileocecal                            valve. The colonoscopy was performed without                            difficulty. The patient tolerated the procedure                            well. The quality of the bowel preparation was                            adequate. The ileocecal valve, appendiceal orifice,                            and rectum were photographed. Scope In: 2:21:36 PM Scope Out: 2:42:44 PM Scope Withdrawal Time: 0 hours 18 minutes 22 seconds  Total Procedure Duration: 0 hours 21 minutes 8 seconds  Findings:                 The perianal and digital rectal examinations were                            normal.                           Three sessile polyps were found in the transverse                            colon. The polyps were 5 to 7 mm in size. These                            polyps were removed with a cold snare. Resection                            and retrieval were complete.  Three sessile polyps were found in the sigmoid                            colon. The polyps were 3 to 5 mm in size. These                            polyps were removed with a cold snare. Resection                            and retrieval were complete.                           Multiple medium-mouthed diverticula were found in                            the left colon.                           Non-bleeding internal hemorrhoids were found during                            retroflexion.                           The exam was otherwise without abnormality. Complications:            No immediate complications. Estimated blood loss:                            Minimal. Estimated Blood Loss:     Estimated blood loss was minimal. Impression:               - Three 5 to 7 mm polyps in the transverse colon,                            removed with a cold snare. Resected and  retrieved.                           - Three 3 to 5 mm polyps in the sigmoid colon,                            removed with a cold snare. Resected and retrieved.                           - Diverticulosis in the left colon.                           - Non-bleeding internal hemorrhoids.                           - The examination was otherwise normal. Recommendation:           - Patient has a contact number available for                            emergencies. The  signs and symptoms of potential                            delayed complications were discussed with the                            patient. Return to normal activities tomorrow.                            Written discharge instructions were provided to the                            patient.                           - Resume previous diet.                           - Continue present medications.                           - No aspirin, ibuprofen, naproxen, or other                            non-steroidal anti-inflammatory drugs for 2 weeks                            after polyp removal.                           - Await pathology results.                           - Repeat colonoscopy is recommended for                            surveillance. The colonoscopy date will be                            determined after pathology results from today's                            exam become available for review. Remo Lipps P. Havery Moros, MD 06/01/2016 2:48:23 PM This report has been signed electronically.

## 2016-06-02 ENCOUNTER — Telehealth: Payer: Self-pay | Admitting: *Deleted

## 2016-06-05 ENCOUNTER — Encounter: Payer: Self-pay | Admitting: Gastroenterology

## 2016-06-05 ENCOUNTER — Ambulatory Visit (HOSPITAL_COMMUNITY): Payer: PPO | Admitting: Hematology & Oncology

## 2016-06-07 NOTE — Progress Notes (Signed)
This encounter was created in error - please disregard.

## 2016-06-10 DIAGNOSIS — G2581 Restless legs syndrome: Secondary | ICD-10-CM | POA: Diagnosis not present

## 2016-06-10 DIAGNOSIS — F3181 Bipolar II disorder: Secondary | ICD-10-CM | POA: Diagnosis not present

## 2016-06-10 DIAGNOSIS — G4726 Circadian rhythm sleep disorder, shift work type: Secondary | ICD-10-CM | POA: Diagnosis not present

## 2016-06-11 ENCOUNTER — Ambulatory Visit: Payer: PPO | Admitting: Family Medicine

## 2016-06-12 ENCOUNTER — Ambulatory Visit: Payer: PPO | Admitting: Family Medicine

## 2016-06-16 ENCOUNTER — Ambulatory Visit (HOSPITAL_COMMUNITY): Payer: PPO | Admitting: Hematology & Oncology

## 2016-06-16 DIAGNOSIS — G2581 Restless legs syndrome: Secondary | ICD-10-CM | POA: Diagnosis not present

## 2016-06-16 DIAGNOSIS — G4726 Circadian rhythm sleep disorder, shift work type: Secondary | ICD-10-CM | POA: Diagnosis not present

## 2016-06-16 DIAGNOSIS — F3181 Bipolar II disorder: Secondary | ICD-10-CM | POA: Diagnosis not present

## 2016-07-02 DIAGNOSIS — F3181 Bipolar II disorder: Secondary | ICD-10-CM | POA: Diagnosis not present

## 2016-07-02 DIAGNOSIS — G2581 Restless legs syndrome: Secondary | ICD-10-CM | POA: Diagnosis not present

## 2016-07-02 DIAGNOSIS — G4726 Circadian rhythm sleep disorder, shift work type: Secondary | ICD-10-CM | POA: Diagnosis not present

## 2016-07-21 ENCOUNTER — Encounter: Payer: Self-pay | Admitting: Family Medicine

## 2016-07-21 ENCOUNTER — Ambulatory Visit (INDEPENDENT_AMBULATORY_CARE_PROVIDER_SITE_OTHER): Payer: PPO | Admitting: Family Medicine

## 2016-07-21 DIAGNOSIS — M79605 Pain in left leg: Secondary | ICD-10-CM

## 2016-07-21 MED ORDER — TIZANIDINE HCL 2 MG PO TABS: 2.0000 mg | ORAL_TABLET | Freq: Three times a day (TID) | ORAL | 0 refills | Status: DC | PRN

## 2016-07-21 NOTE — Progress Notes (Signed)
Name: Bryan Flowers   MRN: [id number]    DOB: 13-Nov-1960   Date:07/21/2016       Progress Note  Subjective  Chief Complaint  Chief Complaint  Patient presents with  . Leg Pain    that radiated down leg to foot left side    Leg Pain   There was no injury mechanism. The pain is present in the left leg and left foot. The quality of the pain is described as aching and burning. The pain is at a severity of 8/10. The pain has been fluctuating since onset. Associated symptoms include tingling. Pertinent negatives include no inability to bear weight (walks with a limp due to the pain), loss of motion or numbness. The symptoms are aggravated by movement (walking). He has tried acetaminophen, heat and ice for the symptoms. The treatment provided no relief.     Past Medical History:  Diagnosis Date  . Anemia    iron deficiency  . Bipolar 1 disorder, manic, moderate ([place])   . Depression   . Diabetes mellitus without complication ([place])   . GERD (gastroesophageal reflux disease)   . Hyperlipidemia   . Hypertension   . Insomnia   . Parkinson disease ([place])   . Peptic ulcer disease with hemorrhage     Past Surgical History:  Procedure Laterality Date  . ESOPHAGOGASTRODUODENOSCOPY Left 03/16/2016   Procedure: ESOPHAGOGASTRODUODENOSCOPY (EGD);  Surgeon: Manus Gunning, MD;  Location: [facility];  Service: Gastroenterology;  Laterality: Left;  . FOOT FRACTURE SURGERY Right     Family History  Problem Relation Age of Onset  . Cancer Mother   . Heart disease Father     Social History   Social History  . Marital status: Married    Spouse name: N/A  . Number of children: N/A  . Years of education: N/A   Occupational History  . Not on file.   Social History Main Topics  . Smoking status: Never Smoker  . Smokeless tobacco: Never Used  . Alcohol use No  . Drug use: No  . Sexual activity: Yes    Partners: Female   Other Topics Concern  . Not on file   Social History  Narrative  . No narrative on file     Current Outpatient Prescriptions:  .  acetaminophen (TYLENOL) 500 MG tablet, Take 1,000 mg by mouth every 6 (six) hours as needed (pain)., Disp: , Rfl:  .  Armodafinil 150 MG tablet, Take 150 mg by mouth daily. Nuvigil, Disp: , Rfl:  .  b complex vitamins capsule, Take 1 capsule by mouth daily., Disp: , Rfl:  .  carbidopa-levodopa (SINEMET CR) 50-200 MG per tablet, Take 1 tablet by mouth 3 (three) times daily. , Disp: , Rfl:  .  DULoxetine (CYMBALTA) 60 MG capsule, Take 120 mg by mouth at bedtime. , Disp: , Rfl:  .  Exenatide ER 2 MG PEN, Inject 2 mg into the skin once a week., Disp: 1 each, Rfl: 11 .  fluticasone (FLONASE) 50 MCG/ACT nasal spray, Place 2 sprays into both nostrils daily., Disp: 16 g, Rfl: 0 .  glyBURIDE-metformin (GLUCOVANCE) 2.5-500 MG tablet, TAKE 2 TABLETS BY MOUTH TWICE DAILY WITH MEALS, Disp: 120 tablet, Rfl: 0 .  insulin detemir (LEVEMIR) 100 UNIT/ML injection, Inject 0.18 mLs (18 Units total) into the skin at bedtime., Disp: 10 mL, Rfl: 1 .  lamoTRIgine (LAMICTAL) 200 MG tablet, Take 200 mg by mouth at bedtime. , Disp: , Rfl:  .  LORazepam (ATIVAN)  0.5 MG tablet, Take 0.5 mg by mouth 3 (three) times daily as needed for anxiety. , Disp: , Rfl:  .  pantoprazole (PROTONIX) 40 MG tablet, Take 1 tablet (40 mg total) by mouth daily., Disp: 30 tablet, Rfl: 0 .  propranolol ER (INDERAL LA) 60 MG 24 hr capsule, Take 60 mg by mouth at bedtime. , Disp: , Rfl:  .  QUEtiapine (SEROQUEL) 300 MG tablet, Take 300 mg by mouth at bedtime., Disp: , Rfl: 5 .  quinapril-hydrochlorothiazide (ACCURETIC) 20-12.5 MG tablet, Take 2 tablets by mouth daily., Disp: 180 tablet, Rfl: 0 .  rOPINIRole (REQUIP) 1 MG tablet, Take 1 mg by mouth at bedtime., Disp: , Rfl:  .  simvastatin (ZOCOR) 40 MG tablet, TAKE 1 TABLET BY MOUTH AT BEDTIME, Disp: 90 tablet, Rfl: 0  Current Facility-Administered Medications:  .  0.9 %  sodium chloride infusion, 500 mL, Intravenous,  Continuous, Manus Gunning, MD  Facility-Administered Medications Ordered in Other Visits:  .  0.9 %  sodium chloride infusion, , Intravenous, Once, Brunetta Genera, MD .  alteplase (CATHFLO ACTIVASE) injection 2 mg, 2 mg, Intracatheter, Once PRN, Brunetta Genera, MD .  heparin lock flush 100 unit/mL, 500 Units, Intracatheter, Once PRN, Brunetta Genera, MD .  heparin lock flush 100 unit/mL, 250 Units, Intracatheter, Once PRN, Brunetta Genera, MD .  sodium chloride 0.9 % injection 10 mL, 10 mL, Intracatheter, PRN, Brunetta Genera, MD .  sodium chloride 0.9 % injection 3 mL, 3 mL, Intravenous, Once PRN, Brunetta Genera, MD  No Known Allergies   Review of Systems  Constitutional: Negative for chills, fever and malaise/fatigue.  Eyes: Negative for blurred vision and double vision.  Respiratory: Negative for cough and shortness of breath.   Cardiovascular: Negative for chest pain and leg swelling.  Neurological: Positive for tingling and headaches. Negative for numbness.     Objective  Vitals:   07/21/16 1338  BP: 130/80  Pulse: 98  Resp: 18  Temp: 98 F (36.7 C)  TempSrc: Oral  SpO2: 98%  Weight: 243 lb 4.8 oz (110.4 kg)  Height: 5\' 11"  (1.803 m)    Physical Exam  Constitutional: He is oriented to person, place, and time and well-developed, well-nourished, and in no distress.  HENT:  Head: Normocephalic and atraumatic.  Musculoskeletal:       Feet:  Mild tenderness to palpation along the left lateral lower leg extending down into the left lateral foot, no obvious signs of injury to right leg or foot, normal skin, strength and sensation, normal peripheral pulses,   Neurological: He is alert and oriented to person, place, and time.  Psychiatric: Mood, memory, affect and judgment normal.  Nursing note and vitals reviewed.   Assessment & Plan  1. Musculoskeletal leg pain, left No obvious abnormality of the left leg and foot on physical  exam, and be part of the fibromyalgia symptom complex. We'll start on muscle relaxant therapy. Advised to use a heating pad for relief. Follow-up if no improvement. - tiZANidine (ZANAFLEX) 2 MG tablet; Take 1 tablet (2 mg total) by mouth every 8 (eight) hours as needed for muscle spasms.  Dispense: 30 tablet; Refill: 0   Mathew Postiglione Asad A. [facility] 07/21/2016 1:46 PM

## 2016-07-23 DIAGNOSIS — G2581 Restless legs syndrome: Secondary | ICD-10-CM | POA: Diagnosis not present

## 2016-07-23 DIAGNOSIS — G4726 Circadian rhythm sleep disorder, shift work type: Secondary | ICD-10-CM | POA: Diagnosis not present

## 2016-07-23 DIAGNOSIS — F3181 Bipolar II disorder: Secondary | ICD-10-CM | POA: Diagnosis not present

## 2016-07-31 DIAGNOSIS — F3181 Bipolar II disorder: Secondary | ICD-10-CM | POA: Diagnosis not present

## 2016-07-31 DIAGNOSIS — G4726 Circadian rhythm sleep disorder, shift work type: Secondary | ICD-10-CM | POA: Diagnosis not present

## 2016-07-31 DIAGNOSIS — G2581 Restless legs syndrome: Secondary | ICD-10-CM | POA: Diagnosis not present

## 2016-08-20 ENCOUNTER — Encounter: Payer: Self-pay | Admitting: Family Medicine

## 2016-08-20 ENCOUNTER — Ambulatory Visit (INDEPENDENT_AMBULATORY_CARE_PROVIDER_SITE_OTHER): Payer: PPO | Admitting: Family Medicine

## 2016-08-20 VITALS — BP 122/80 | HR 99 | Temp 97.6°F | Resp 18 | Ht 71.0 in | Wt 234.5 lb

## 2016-08-20 DIAGNOSIS — E559 Vitamin D deficiency, unspecified: Secondary | ICD-10-CM | POA: Diagnosis not present

## 2016-08-20 DIAGNOSIS — M79605 Pain in left leg: Secondary | ICD-10-CM

## 2016-08-20 DIAGNOSIS — Z79899 Other long term (current) drug therapy: Secondary | ICD-10-CM | POA: Diagnosis not present

## 2016-08-20 DIAGNOSIS — M79604 Pain in right leg: Secondary | ICD-10-CM

## 2016-08-20 LAB — COMPLETE METABOLIC PANEL WITH GFR
ALT: 38 U/L (ref 9–46)
AST: 26 U/L (ref 10–35)
Albumin: 4.4 g/dL (ref 3.6–5.1)
Alkaline Phosphatase: 73 U/L (ref 40–115)
BILIRUBIN TOTAL: 0.4 mg/dL (ref 0.2–1.2)
BUN: 25 mg/dL (ref 7–25)
CHLORIDE: 92 mmol/L — AB (ref 98–110)
CO2: 23 mmol/L (ref 20–31)
CREATININE: 1.07 mg/dL (ref 0.70–1.33)
Calcium: 9.3 mg/dL (ref 8.6–10.3)
GFR, Est Non African American: 78 mL/min (ref >=60)
GLUCOSE: 456 mg/dL — AB (ref 65–99)
Potassium: 5.6 mmol/L — ABNORMAL HIGH (ref 3.5–5.3)
SODIUM: 129 mmol/L — AB (ref 135–146)
TOTAL PROTEIN: 6.9 g/dL (ref 6.1–8.1)

## 2016-08-20 LAB — CBC WITH DIFFERENTIAL/PLATELET
BASOS PCT: 0 %
Basophils Absolute: 0 cells/uL (ref 0–200)
EOS PCT: 1 %
Eosinophils Absolute: 67 cells/uL (ref 15–500)
HCT: 42.8 % (ref 38.5–50.0)
Hemoglobin: 14.4 g/dL (ref 13.2–17.1)
LYMPHS PCT: 26 %
Lymphs Abs: 1742 cells/uL (ref 850–3900)
MCH: 29.9 pg (ref 27.0–33.0)
MCHC: 33.6 g/dL (ref 32.0–36.0)
MCV: 88.8 fL (ref 80.0–100.0)
MONOS PCT: 8 %
MPV: 10.7 fL (ref 7.5–12.5)
Monocytes Absolute: 536 cells/uL (ref 200–950)
NEUTROS ABS: 4355 {cells}/uL (ref 1500–7800)
Neutrophils Relative %: 65 %
PLATELETS: 289 10*3/uL (ref 140–400)
RBC: 4.82 MIL/uL (ref 4.20–5.80)
RDW: 14.8 % (ref 11.0–15.0)
WBC: 6.7 10*3/uL (ref 3.8–10.8)

## 2016-08-20 LAB — MAGNESIUM: MAGNESIUM: 2.3 mg/dL (ref 1.5–2.5)

## 2016-08-20 NOTE — Progress Notes (Signed)
Name: Bryan Flowers   MRN: [id number]    DOB: 03/01/1961   Date:08/20/2016       Progress Note  Subjective  Chief Complaint  Chief Complaint  Patient presents with  . Annual Exam    CPE    HPI  Leg Cramps:  Pt. Presents for evaluation of leg cramps, feels weakness in his lower back and legs, describes having leg cramps sometimes severe enough to wake him up from sleep. Generally feels fatigued and not able to walk as much as before.  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    Past Medical History:  Diagnosis Date  . Anemia    iron deficiency  . Bipolar 1 disorder, manic, moderate ([place])   . Depression   . Diabetes mellitus without complication ([place])   . GERD (gastroesophageal reflux disease)   . Hyperlipidemia   . Hypertension   . Insomnia   . Parkinson disease ([place])   . Peptic ulcer disease with hemorrhage     Past Surgical History:  Procedure Laterality Date  . ESOPHAGOGASTRODUODENOSCOPY Left 03/16/2016   Procedure: ESOPHAGOGASTRODUODENOSCOPY (EGD);  Surgeon: Manus Gunning, MD;  Location: [facility];  Service: Gastroenterology;  Laterality: Left;  . FOOT FRACTURE SURGERY Right     Family History  Problem Relation Age of Onset  . Cancer Mother   . Heart disease Father     Social History   Social History  . Marital status: Married    Spouse name: N/A  . Number of children: N/A  . Years of education: N/A   Occupational History  . Not on file.   Social History Main Topics  . Smoking status: Never Smoker  . Smokeless tobacco: Never Used  . Alcohol use No  . Drug use: No  . Sexual activity: Yes    Partners: Female   Other Topics Concern  . Not on file   Social History Narrative  . No narrative on file     Current Outpatient Prescriptions:  .  acetaminophen (TYLENOL) 500 MG tablet, Take 1,000 mg by mouth every 6 (six) hours as needed  (pain)., Disp: , Rfl:  .  Armodafinil 150 MG tablet, Take 150 mg by mouth daily. Nuvigil, Disp: , Rfl:  .  b complex vitamins capsule, Take 1 capsule by mouth daily., Disp: , Rfl:  .  carbidopa-levodopa (SINEMET CR) 50-200 MG per tablet, Take 1 tablet by mouth 3 (three) times daily. , Disp: , Rfl:  .  DULoxetine (CYMBALTA) 60 MG capsule, Take 120 mg by mouth at bedtime. , Disp: , Rfl:  .  Exenatide ER 2 MG PEN, Inject 2 mg into the skin once a week., Disp: 1 each, Rfl: 11 .  fluticasone (FLONASE) 50 MCG/ACT nasal spray, Place 2 sprays into both nostrils daily., Disp: 16 g, Rfl: 0 .  glyBURIDE-metformin (GLUCOVANCE) 2.5-500 MG tablet, TAKE 2 TABLETS BY MOUTH TWICE DAILY WITH MEALS, Disp: 120 tablet, Rfl: 0 .  insulin detemir (LEVEMIR) 100 UNIT/ML injection, Inject 0.18 mLs (18 Units total) into the skin at bedtime., Disp: 10 mL, Rfl: 1 .  lamoTRIgine (LAMICTAL) 200 MG tablet, Take 200 mg by mouth at bedtime. , Disp: , Rfl:  .  LORazepam (ATIVAN) 0.5 MG tablet, Take 0.5 mg by mouth 3 (three) times daily as needed for anxiety. , Disp: , Rfl:  .  pantoprazole (PROTONIX) 40 MG tablet, Take 1 tablet (40  mg total) by mouth daily., Disp: 30 tablet, Rfl: 0 .  propranolol ER (INDERAL LA) 60 MG 24 hr capsule, Take 60 mg by mouth at bedtime. , Disp: , Rfl:  .  QUEtiapine (SEROQUEL) 300 MG tablet, Take 300 mg by mouth at bedtime., Disp: , Rfl: 5 .  quinapril-hydrochlorothiazide (ACCURETIC) 20-12.5 MG tablet, Take 2 tablets by mouth daily., Disp: 180 tablet, Rfl: 0 .  rOPINIRole (REQUIP) 1 MG tablet, Take 1 mg by mouth at bedtime., Disp: , Rfl:  .  simvastatin (ZOCOR) 40 MG tablet, TAKE 1 TABLET BY MOUTH AT BEDTIME, Disp: 90 tablet, Rfl: 0 .  tiZANidine (ZANAFLEX) 2 MG tablet, Take 1 tablet (2 mg total) by mouth every 8 (eight) hours as needed for muscle spasms., Disp: 30 tablet, Rfl: 0  Current Facility-Administered Medications:  .  0.9 %  sodium chloride infusion, 500 mL, Intravenous, Continuous, Manus Gunning, MD  Facility-Administered Medications Ordered in Other Visits:  .  0.9 %  sodium chloride infusion, , Intravenous, Once, Brunetta Genera, MD .  alteplase (CATHFLO ACTIVASE) injection 2 mg, 2 mg, Intracatheter, Once PRN, Brunetta Genera, MD .  heparin lock flush 100 unit/mL, 500 Units, Intracatheter, Once PRN, Brunetta Genera, MD .  heparin lock flush 100 unit/mL, 250 Units, Intracatheter, Once PRN, Brunetta Genera, MD .  sodium chloride 0.9 % injection 10 mL, 10 mL, Intracatheter, PRN, Brunetta Genera, MD .  sodium chloride 0.9 % injection 3 mL, 3 mL, Intravenous, Once PRN, Brunetta Genera, MD  No Known Allergies   Review of Systems  Constitutional: Positive for malaise/fatigue. Negative for chills, fever and weight loss.  Respiratory: Positive for shortness of breath.   Cardiovascular: Positive for palpitations. Negative for chest pain.  Musculoskeletal: Positive for back pain and myalgias. Negative for joint pain.  Psychiatric/Behavioral: Positive for depression. The patient is nervous/anxious.       Objective  Vitals:   08/20/16 1044  BP: 122/80  Pulse: 99  Resp: 18  Temp: 97.6 F (36.4 C)  TempSrc: Oral  SpO2: 97%  Weight: 234 lb 8 oz (106.4 kg)  Height: 5\' 11"  (1.803 m)    Physical Exam  Constitutional: He is oriented to person, place, and time and well-developed, well-nourished, and in no distress.  HENT:  Head: Normocephalic and atraumatic.  Cardiovascular: Normal rate, regular rhythm, S1 normal and S2 normal.   Murmur heard.  Systolic murmur is present with a grade of 3/6  Pulmonary/Chest: Effort normal and breath sounds normal. He has no wheezes.  Musculoskeletal:       Right upper leg: He exhibits tenderness.       Left upper leg: He exhibits tenderness.       Right lower leg: He exhibits tenderness. He exhibits no swelling and no edema.       Left lower leg: He exhibits tenderness. He exhibits no swelling and no edema.         Legs: Generalized myalgia over the left and right lower leg, no obvious swelling, left anterior leg has healing skin rash, normal strength in both extremities.  Neurological: He is alert and oriented to person, place, and time.  Psychiatric: Mood, memory, affect and judgment normal.  Nursing note and vitals reviewed.    Assessment & Plan  1. Bilateral leg pain Likely a component of fibromyalgia, obtain lab work for evaluation of other possible etiologies. Consider starting on muscle relaxant - CBC with Differential - COMPLETE METABOLIC PANEL WITH GFR -  Vitamin D (25 hydroxy) - Magnesium - B12    Maiana Hennigan Asad A. [facility] 08/20/2016 11:03 AM

## 2016-08-21 ENCOUNTER — Telehealth: Payer: Self-pay | Admitting: Emergency Medicine

## 2016-08-21 ENCOUNTER — Other Ambulatory Visit: Payer: Self-pay | Admitting: Family Medicine

## 2016-08-21 LAB — VITAMIN B12: Vitamin B-12: 422 pg/mL (ref 200–1100)

## 2016-08-21 LAB — VITAMIN D 25 HYDROXY (VIT D DEFICIENCY, FRACTURES): Vit D, 25-Hydroxy: 15 ng/mL — ABNORMAL LOW (ref 30–100)

## 2016-08-21 NOTE — Telephone Encounter (Signed)
[facility] lab called Glucose was 456. Repeated and vertified

## 2016-08-24 ENCOUNTER — Telehealth: Payer: Self-pay

## 2016-08-24 DIAGNOSIS — G4733 Obstructive sleep apnea (adult) (pediatric): Secondary | ICD-10-CM

## 2016-08-24 MED ORDER — VITAMIN D (ERGOCALCIFEROL) 1.25 MG (50000 UNIT) PO CAPS: 50000.0000 [IU] | ORAL_CAPSULE | ORAL | 0 refills | Status: DC

## 2016-08-24 NOTE — Telephone Encounter (Signed)
Patient has been notified of lab results and a prescription for vitamin D3 50,000 units take 1 capsule once a week x12 weeks has been sent to [facility] [address] per Dr. Shah, patient has been notified   

## 2016-08-25 NOTE — Telephone Encounter (Signed)
Reviewed and acknowledged, patient has poorly controlled diabetes and is being followed by endocrinology. Please advise to schedule an appointment for medication management with endocrinologist

## 2016-08-31 ENCOUNTER — Telehealth: Payer: Self-pay

## 2016-08-31 MED ORDER — GLYBURIDE-METFORMIN 2.5-500 MG PO TABS: 2.0000 | ORAL_TABLET | Freq: Two times a day (BID) | ORAL | 0 refills | Status: DC

## 2016-08-31 NOTE — Telephone Encounter (Signed)
Medication has been refilled and sent to [facility] [address] 

## 2016-09-01 ENCOUNTER — Other Ambulatory Visit: Payer: Self-pay | Admitting: Family Medicine

## 2016-09-01 DIAGNOSIS — F3181 Bipolar II disorder: Secondary | ICD-10-CM | POA: Diagnosis not present

## 2016-09-01 DIAGNOSIS — G2581 Restless legs syndrome: Secondary | ICD-10-CM | POA: Diagnosis not present

## 2016-09-01 DIAGNOSIS — I10 Essential (primary) hypertension: Secondary | ICD-10-CM

## 2016-09-01 DIAGNOSIS — G4726 Circadian rhythm sleep disorder, shift work type: Secondary | ICD-10-CM | POA: Diagnosis not present

## 2016-09-10 DIAGNOSIS — F3181 Bipolar II disorder: Secondary | ICD-10-CM | POA: Diagnosis not present

## 2016-09-10 DIAGNOSIS — G2581 Restless legs syndrome: Secondary | ICD-10-CM | POA: Diagnosis not present

## 2016-09-10 DIAGNOSIS — G4726 Circadian rhythm sleep disorder, shift work type: Secondary | ICD-10-CM | POA: Diagnosis not present

## 2016-10-01 ENCOUNTER — Ambulatory Visit (INDEPENDENT_AMBULATORY_CARE_PROVIDER_SITE_OTHER): Payer: PPO | Admitting: Family Medicine

## 2016-10-01 ENCOUNTER — Encounter: Payer: Self-pay | Admitting: Family Medicine

## 2016-10-01 VITALS — BP 130/68 | HR 88 | Temp 97.9°F | Resp 16 | Ht 71.0 in | Wt 237.7 lb

## 2016-10-01 DIAGNOSIS — IMO0001 Reserved for inherently not codable concepts without codable children: Secondary | ICD-10-CM

## 2016-10-01 DIAGNOSIS — Z0000 Encounter for general adult medical examination without abnormal findings: Secondary | ICD-10-CM | POA: Diagnosis not present

## 2016-10-01 DIAGNOSIS — E1165 Type 2 diabetes mellitus with hyperglycemia: Secondary | ICD-10-CM

## 2016-10-01 DIAGNOSIS — Z794 Long term (current) use of insulin: Secondary | ICD-10-CM

## 2016-10-01 DIAGNOSIS — N429 Disorder of prostate, unspecified: Secondary | ICD-10-CM | POA: Diagnosis not present

## 2016-10-01 LAB — GLUCOSE, POCT (MANUAL RESULT ENTRY)

## 2016-10-01 LAB — POCT GLYCOSYLATED HEMOGLOBIN (HGB A1C): Hemoglobin A1C: >14

## 2016-10-01 MED ORDER — GLYBURIDE-METFORMIN 2.5-500 MG PO TABS: 2.0000 | ORAL_TABLET | Freq: Two times a day (BID) | ORAL | 0 refills | Status: DC

## 2016-10-01 MED ORDER — INSULIN DEGLUDEC-LIRAGLUTIDE 100-3.6 UNIT-MG/ML ~~LOC~~ SOPN: 16.0000 [IU] | PEN_INJECTOR | Freq: Every day | SUBCUTANEOUS | 2 refills | Status: DC

## 2016-10-01 NOTE — Progress Notes (Signed)
Name: Bryan Flowers   MRN: [id number]    DOB: 1961-06-18   Date:10/01/2016       Progress Note  Subjective  Chief Complaint  Chief Complaint  Patient presents with  . Annual Exam    Diabetes  He presents for his follow-up diabetic visit. He has type 2 diabetes mellitus. Hypoglycemia symptoms include nervousness/anxiousness. Associated symptoms include fatigue. Pertinent negatives for diabetes include no blurred vision, no chest pain, no polydipsia, no polyuria and no weight loss. Current diabetic treatment includes oral agent (dual therapy) and intensive insulin program. His breakfast blood glucose range is generally 140-180 mg/dl. An ACE inhibitor/angiotensin II receptor blocker is being taken. Eye exam is current.    Pt. Presents for a complete physical exam, he is doing well. Last colonoscopy was August 2017, repeat in 2 years per patient.    Past Medical History:  Diagnosis Date  . Anemia    iron deficiency  . Bipolar 1 disorder, manic, moderate ([place])   . Depression   . Diabetes mellitus without complication ([place])   . GERD (gastroesophageal reflux disease)   . Hyperlipidemia   . Hypertension   . Insomnia   . Parkinson disease ([place])   . Peptic ulcer disease with hemorrhage     Past Surgical History:  Procedure Laterality Date  . ESOPHAGOGASTRODUODENOSCOPY Left 03/16/2016   Procedure: ESOPHAGOGASTRODUODENOSCOPY (EGD);  Surgeon: Manus Gunning, MD;  Location: [facility];  Service: Gastroenterology;  Laterality: Left;  . FOOT FRACTURE SURGERY Right     Family History  Problem Relation Age of Onset  . Cancer Mother   . Heart disease Father     Social History   Social History  . Marital status: Married    Spouse name: N/A  . Number of children: N/A  . Years of education: N/A   Occupational History  . Not on file.   Social History Main Topics  . Smoking status: Never Smoker  . Smokeless tobacco: Never Used  . Alcohol use No  . Drug use: No  .  Sexual activity: Yes    Partners: Female   Other Topics Concern  . Not on file   Social History Narrative  . No narrative on file     Current Outpatient Prescriptions:  .  acetaminophen (TYLENOL) 500 MG tablet, Take 1,000 mg by mouth every 6 (six) hours as needed (pain)., Disp: , Rfl:  .  Armodafinil 150 MG tablet, Take 150 mg by mouth daily. Nuvigil, Disp: , Rfl:  .  b complex vitamins capsule, Take 1 capsule by mouth daily., Disp: , Rfl:  .  carbidopa-levodopa (SINEMET CR) 50-200 MG per tablet, Take 1 tablet by mouth 3 (three) times daily. , Disp: , Rfl:  .  DULoxetine (CYMBALTA) 60 MG capsule, Take 120 mg by mouth at bedtime. , Disp: , Rfl:  .  Exenatide ER 2 MG PEN, Inject 2 mg into the skin once a week., Disp: 1 each, Rfl: 11 .  fluticasone (FLONASE) 50 MCG/ACT nasal spray, Place 2 sprays into both nostrils daily., Disp: 16 g, Rfl: 0 .  glyBURIDE-metformin (GLUCOVANCE) 2.5-500 MG tablet, Take 2 tablets by mouth 2 (two) times daily with a meal., Disp: 120 tablet, Rfl: 0 .  insulin detemir (LEVEMIR) 100 UNIT/ML injection, Inject 0.18 mLs (18 Units total) into the skin at bedtime., Disp: 10 mL, Rfl: 1 .  lamoTRIgine (LAMICTAL) 200 MG tablet, Take 200 mg by mouth at bedtime. , Disp: , Rfl:  .  LORazepam (ATIVAN) 0.5  MG tablet, Take 0.5 mg by mouth 3 (three) times daily as needed for anxiety. , Disp: , Rfl:  .  pantoprazole (PROTONIX) 40 MG tablet, Take 1 tablet (40 mg total) by mouth daily., Disp: 30 tablet, Rfl: 0 .  propranolol ER (INDERAL LA) 60 MG 24 hr capsule, Take 60 mg by mouth at bedtime. , Disp: , Rfl:  .  QUEtiapine (SEROQUEL) 300 MG tablet, Take 300 mg by mouth at bedtime., Disp: , Rfl: 5 .  quinapril-hydrochlorothiazide (ACCURETIC) 20-12.5 MG tablet, TAKE 2 TABLETS BY MOUTH DAILY, Disp: 180 tablet, Rfl: 0 .  rOPINIRole (REQUIP) 1 MG tablet, Take 1 mg by mouth at bedtime., Disp: , Rfl:  .  simvastatin (ZOCOR) 40 MG tablet, TAKE 1 TABLET BY MOUTH AT BEDTIME, Disp: 90 tablet,  Rfl: 0 .  tiZANidine (ZANAFLEX) 2 MG tablet, Take 1 tablet (2 mg total) by mouth every 8 (eight) hours as needed for muscle spasms., Disp: 30 tablet, Rfl: 0 .  Vitamin D, Ergocalciferol, (DRISDOL) 50000 units CAPS capsule, Take 1 capsule (50,000 Units total) by mouth once a week. For 12 weeks, Disp: 12 capsule, Rfl: 0 .  LYRICA 25 MG capsule, TK 1 C PO QHS FOR 1 WEEK THEN INCREASE TO 2 CS QHS, Disp: , Rfl: 2 .  QUEtiapine (SEROQUEL XR) 400 MG 24 hr tablet, TK 1 T PO QD AT 8PM ONE HOUR BEFORE OR AFTER A MEAL, Disp: , Rfl: 2  Current Facility-Administered Medications:  .  0.9 %  sodium chloride infusion, 500 mL, Intravenous, Continuous, Manus Gunning, MD  Facility-Administered Medications Ordered in Other Visits:  .  0.9 %  sodium chloride infusion, , Intravenous, Once, Brunetta Genera, MD .  alteplase (CATHFLO ACTIVASE) injection 2 mg, 2 mg, Intracatheter, Once PRN, Brunetta Genera, MD .  heparin lock flush 100 unit/mL, 500 Units, Intracatheter, Once PRN, Brunetta Genera, MD .  heparin lock flush 100 unit/mL, 250 Units, Intracatheter, Once PRN, Brunetta Genera, MD .  sodium chloride 0.9 % injection 10 mL, 10 mL, Intracatheter, PRN, Brunetta Genera, MD .  sodium chloride 0.9 % injection 3 mL, 3 mL, Intravenous, Once PRN, Brunetta Genera, MD  No Known Allergies   Review of Systems  Constitutional: Positive for fatigue and malaise/fatigue. Negative for chills, fever and weight loss.  HENT: Positive for sinus pain and sore throat. Negative for congestion.   Eyes: Negative for blurred vision and double vision.  Respiratory: Negative for shortness of breath.   Cardiovascular: Negative for chest pain and leg swelling.  Gastrointestinal: Positive for constipation. Negative for abdominal pain, blood in stool, diarrhea, nausea and vomiting.  Genitourinary: Negative for hematuria.  Musculoskeletal: Positive for back pain. Negative for joint pain and neck pain.   Endo/Heme/Allergies: Negative for polydipsia.  Psychiatric/Behavioral: Positive for depression. The patient is nervous/anxious.     Objective  Vitals:   10/01/16 1349  BP: 130/68  Pulse: 88  Resp: 16  Temp: 97.9 F (36.6 C)  TempSrc: Oral  SpO2: 97%  Weight: 237 lb 11.2 oz (107.8 kg)  Height: 5\' 11"  (1.803 m)    Physical Exam  Constitutional: He is oriented to person, place, and time and well-developed, well-nourished, and in no distress.  HENT:  Head: Normocephalic and atraumatic.  Cardiovascular: Normal rate and regular rhythm.   Murmur heard.  Systolic murmur is present with a grade of 2/6  Pulmonary/Chest: Effort normal and breath sounds normal. He has no wheezes. He has no rhonchi.  Abdominal: Soft. Bowel sounds are normal. There is no tenderness.  Genitourinary: Prostate is enlarged. Prostate is not tender.  Musculoskeletal:       Right ankle: He exhibits swelling.       Left ankle: He exhibits swelling.  1+ pitting edema bilateral lower extremities  Neurological: He is alert and oriented to person, place, and time.  Skin: Skin is warm, dry and intact.  Psychiatric: Mood, memory, affect and judgment normal.  Nursing note and vitals reviewed.       Assessment & Plan  1. Annual physical exam Obtain age-appropriate letter screening - PSA  2. Uncontrolled type 2 diabetes mellitus without complication, with long-term current use of insulin (HCC) A1c is 14%, poorly controlled diabetes. Increased from 8.0% from June 2017. We will start on Xultophy and discontinue Bydureon and Levemir. Continue on Glucovance as prescribed. Check BG 3 times a day, bring logs for review in one month - glyBURIDE-metformin (GLUCOVANCE) 2.5-500 MG tablet; Take 2 tablets by mouth 2 (two) times daily with a meal.  Dispense: 120 tablet; Refill: 0 - POCT HgB A1C - POCT Glucose (CBG) - Insulin Degludec-Liraglutide (XULTOPHY) 100-3.6 UNIT-MG/ML SOPN; Inject 16 Units into the skin at  bedtime.  Dispense: 15 mL; Refill: 2   [facility] 10/01/2016 2:03 PM

## 2016-10-02 LAB — PSA: PSA: 0.4 ng/mL (ref <=4.0)

## 2016-10-09 DIAGNOSIS — F3181 Bipolar II disorder: Secondary | ICD-10-CM | POA: Diagnosis not present

## 2016-10-09 DIAGNOSIS — G2581 Restless legs syndrome: Secondary | ICD-10-CM | POA: Diagnosis not present

## 2016-10-09 DIAGNOSIS — G4726 Circadian rhythm sleep disorder, shift work type: Secondary | ICD-10-CM | POA: Diagnosis not present

## 2016-11-02 ENCOUNTER — Encounter: Payer: Self-pay | Admitting: Family Medicine

## 2016-11-02 ENCOUNTER — Ambulatory Visit (INDEPENDENT_AMBULATORY_CARE_PROVIDER_SITE_OTHER): Payer: PPO | Admitting: Family Medicine

## 2016-11-02 VITALS — BP 140/70 | HR 94 | Temp 97.5°F | Resp 18 | Ht 71.0 in | Wt 234.2 lb

## 2016-11-02 DIAGNOSIS — E1165 Type 2 diabetes mellitus with hyperglycemia: Secondary | ICD-10-CM

## 2016-11-02 DIAGNOSIS — IMO0001 Reserved for inherently not codable concepts without codable children: Secondary | ICD-10-CM

## 2016-11-02 DIAGNOSIS — Z794 Long term (current) use of insulin: Secondary | ICD-10-CM | POA: Diagnosis not present

## 2016-11-02 DIAGNOSIS — Z23 Encounter for immunization: Secondary | ICD-10-CM

## 2016-11-02 MED ORDER — GLIPIZIDE 5 MG PO TABS: 5.0000 mg | ORAL_TABLET | Freq: Every day | ORAL | 0 refills | Status: DC

## 2016-11-02 MED ORDER — METFORMIN HCL 1000 MG PO TABS: 1000.0000 mg | ORAL_TABLET | Freq: Two times a day (BID) | ORAL | 0 refills | Status: DC

## 2016-11-02 NOTE — Progress Notes (Signed)
Name: Bryan Flowers   MRN: [id number]    DOB: 1961-01-10   Date:11/02/2016       Progress Note  Subjective  Chief Complaint  Chief Complaint  Patient presents with  . Diabetes    1 month follow up    Diabetes  He presents for his follow-up diabetic visit. He has type 2 diabetes mellitus. His disease course has been improving. There are no hypoglycemic associated symptoms. Associated symptoms include polydipsia. Pertinent negatives for diabetes include no fatigue, no foot paresthesias and no polyuria. Symptoms are stable. Pertinent negatives for diabetic complications include no CVA or heart disease. Current diabetic treatment includes intensive insulin program and oral agent (dual therapy). His weight is stable. He is following a diabetic and generally healthy diet. His breakfast blood glucose range is generally >200 mg/dl. His lunch blood glucose range is generally >200 mg/dl. An ACE inhibitor/angiotensin II receptor blocker is being taken.     Past Medical History:  Diagnosis Date  . Anemia    iron deficiency  . Bipolar 1 disorder, manic, moderate ([place])   . Depression   . Diabetes mellitus without complication ([place])   . GERD (gastroesophageal reflux disease)   . Hyperlipidemia   . Hypertension   . Insomnia   . Parkinson disease ([place])   . Peptic ulcer disease with hemorrhage     Past Surgical History:  Procedure Laterality Date  . ESOPHAGOGASTRODUODENOSCOPY Left 03/16/2016   Procedure: ESOPHAGOGASTRODUODENOSCOPY (EGD);  Surgeon: Manus Gunning, MD;  Location: [facility];  Service: Gastroenterology;  Laterality: Left;  . FOOT FRACTURE SURGERY Right     Family History  Problem Relation Age of Onset  . Cancer Mother   . Heart disease Father     Social History   Social History  . Marital status: Married    Spouse name: N/A  . Number of children: N/A  . Years of education: N/A   Occupational History  . Not on file.   Social History Main Topics  .  Smoking status: Never Smoker  . Smokeless tobacco: Never Used  . Alcohol use No  . Drug use: No  . Sexual activity: Yes    Partners: Female   Other Topics Concern  . Not on file   Social History Narrative  . No narrative on file     Current Outpatient Prescriptions:  .  acetaminophen (TYLENOL) 500 MG tablet, Take 1,000 mg by mouth every 6 (six) hours as needed (pain)., Disp: , Rfl:  .  Armodafinil 150 MG tablet, Take 150 mg by mouth daily. Nuvigil, Disp: , Rfl:  .  b complex vitamins capsule, Take 1 capsule by mouth daily., Disp: , Rfl:  .  carbidopa-levodopa (SINEMET CR) 50-200 MG per tablet, Take 1 tablet by mouth 3 (three) times daily. , Disp: , Rfl:  .  DULoxetine (CYMBALTA) 60 MG capsule, Take 120 mg by mouth at bedtime. , Disp: , Rfl:  .  fluticasone (FLONASE) 50 MCG/ACT nasal spray, Place 2 sprays into both nostrils daily., Disp: 16 g, Rfl: 0 .  Insulin Degludec-Liraglutide (XULTOPHY) 100-3.6 UNIT-MG/ML SOPN, Inject 16 Units into the skin at bedtime., Disp: 15 mL, Rfl: 2 .  lamoTRIgine (LAMICTAL) 200 MG tablet, Take 200 mg by mouth at bedtime. , Disp: , Rfl:  .  LORazepam (ATIVAN) 0.5 MG tablet, Take 0.5 mg by mouth 3 (three) times daily as needed for anxiety. , Disp: , Rfl:  .  LYRICA 25 MG capsule, TK 1 C PO QHS FOR  1 WEEK THEN INCREASE TO 2 CS QHS, Disp: , Rfl: 2 .  pantoprazole (PROTONIX) 40 MG tablet, Take 1 tablet (40 mg total) by mouth daily., Disp: 30 tablet, Rfl: 0 .  propranolol ER (INDERAL LA) 60 MG 24 hr capsule, Take 60 mg by mouth at bedtime. , Disp: , Rfl:  .  QUEtiapine (SEROQUEL XR) 400 MG 24 hr tablet, TK 1 T PO QD AT 8PM ONE HOUR BEFORE OR AFTER A MEAL, Disp: , Rfl: 2 .  QUEtiapine (SEROQUEL) 300 MG tablet, Take 300 mg by mouth at bedtime., Disp: , Rfl: 5 .  quinapril-hydrochlorothiazide (ACCURETIC) 20-12.5 MG tablet, TAKE 2 TABLETS BY MOUTH DAILY, Disp: 180 tablet, Rfl: 0 .  rOPINIRole (REQUIP) 1 MG tablet, Take 1 mg by mouth at bedtime., Disp: , Rfl:  .   simvastatin (ZOCOR) 40 MG tablet, TAKE 1 TABLET BY MOUTH AT BEDTIME, Disp: 90 tablet, Rfl: 0 .  tiZANidine (ZANAFLEX) 2 MG tablet, Take 1 tablet (2 mg total) by mouth every 8 (eight) hours as needed for muscle spasms., Disp: 30 tablet, Rfl: 0 .  Vitamin D, Ergocalciferol, (DRISDOL) 50000 units CAPS capsule, Take 1 capsule (50,000 Units total) by mouth once a week. For 12 weeks, Disp: 12 capsule, Rfl: 0 .  glipiZIDE (GLUCOTROL) 5 MG tablet, Take 1 tablet (5 mg total) by mouth daily before breakfast., Disp: 90 tablet, Rfl: 0 .  metFORMIN (GLUCOPHAGE) 1000 MG tablet, Take 1 tablet (1,000 mg total) by mouth 2 (two) times daily with a meal., Disp: 180 tablet, Rfl: 0  Current Facility-Administered Medications:  .  0.9 %  sodium chloride infusion, 500 mL, Intravenous, Continuous, Manus Gunning, MD  Facility-Administered Medications Ordered in Other Visits:  .  0.9 %  sodium chloride infusion, , Intravenous, Once, Brunetta Genera, MD .  alteplase (CATHFLO ACTIVASE) injection 2 mg, 2 mg, Intracatheter, Once PRN, Brunetta Genera, MD .  heparin lock flush 100 unit/mL, 500 Units, Intracatheter, Once PRN, Brunetta Genera, MD .  heparin lock flush 100 unit/mL, 250 Units, Intracatheter, Once PRN, Brunetta Genera, MD .  sodium chloride 0.9 % injection 10 mL, 10 mL, Intracatheter, PRN, Brunetta Genera, MD .  sodium chloride 0.9 % injection 3 mL, 3 mL, Intravenous, Once PRN, Brunetta Genera, MD  No Known Allergies   Review of Systems  Constitutional: Negative for fatigue.  Endo/Heme/Allergies: Positive for polydipsia.     Objective  Vitals:   11/02/16 1418  BP: 140/70  Pulse: 94  Resp: 18  Temp: 97.5 F (36.4 C)  TempSrc: Oral  SpO2: 94%  Weight: 234 lb 3.2 oz (106.2 kg)  Height: 5\' 11"  (1.803 m)    Physical Exam  Constitutional: He is oriented to person, place, and time and well-developed, well-nourished, and in no distress.  HENT:  Head: Normocephalic and  atraumatic.  Cardiovascular: Normal rate, regular rhythm and normal heart sounds.   No murmur heard. Pulmonary/Chest: Effort normal and breath sounds normal. He has no wheezes.  Abdominal: Soft. Bowel sounds are normal. There is no tenderness.  Musculoskeletal: He exhibits no edema.  Neurological: He is alert and oriented to person, place, and time.  Psychiatric: Mood, memory, affect and judgment normal.  Nursing note and vitals reviewed.         Assessment & Plan  1. Uncontrolled type 2 diabetes mellitus without complication, with long-term current use of insulin ([place]) Blood glucose is still over 200 mg/dL, although significantly improved since being started on Xultophy,  advised to increase to 24 units by going up to units every 4 days, we'll DC glyburide-metformin (not covered by insurance) to metformin and glipizide. Recheck A1c in 2 months - metFORMIN (GLUCOPHAGE) 1000 MG tablet; Take 1 tablet (1,000 mg total) by mouth 2 (two) times daily with a meal.  Dispense: 180 tablet; Refill: 0 - glipiZIDE (GLUCOTROL) 5 MG tablet; Take 1 tablet (5 mg total) by mouth daily before breakfast.  Dispense: 90 tablet; Refill: 0   2. Need for influenza vaccination  - Flu Vaccine QUAD 36+ mos PF IM (Fluarix & Fluzone Quad PF)  Zykira Matlack [facility] 11/02/2016 6:26 PM

## 2016-11-12 DIAGNOSIS — G2581 Restless legs syndrome: Secondary | ICD-10-CM | POA: Diagnosis not present

## 2016-11-12 DIAGNOSIS — F3181 Bipolar II disorder: Secondary | ICD-10-CM | POA: Diagnosis not present

## 2016-11-12 DIAGNOSIS — G4726 Circadian rhythm sleep disorder, shift work type: Secondary | ICD-10-CM | POA: Diagnosis not present

## 2016-12-29 DIAGNOSIS — G2581 Restless legs syndrome: Secondary | ICD-10-CM | POA: Diagnosis not present

## 2016-12-29 DIAGNOSIS — G4726 Circadian rhythm sleep disorder, shift work type: Secondary | ICD-10-CM | POA: Diagnosis not present

## 2016-12-29 DIAGNOSIS — F3181 Bipolar II disorder: Secondary | ICD-10-CM | POA: Diagnosis not present

## 2016-12-30 ENCOUNTER — Ambulatory Visit: Payer: PPO | Admitting: Family Medicine

## 2016-12-31 DIAGNOSIS — G20 Parkinson's disease: Secondary | ICD-10-CM | POA: Diagnosis not present

## 2016-12-31 DIAGNOSIS — E6609 Other obesity due to excess calories: Secondary | ICD-10-CM | POA: Diagnosis not present

## 2016-12-31 DIAGNOSIS — G2581 Restless legs syndrome: Secondary | ICD-10-CM | POA: Diagnosis not present

## 2016-12-31 DIAGNOSIS — F319 Bipolar disorder, unspecified: Secondary | ICD-10-CM | POA: Diagnosis not present

## 2016-12-31 DIAGNOSIS — Z6832 Body mass index (BMI) 32.0-32.9, adult: Secondary | ICD-10-CM | POA: Diagnosis not present

## 2017-01-04 ENCOUNTER — Encounter: Payer: Self-pay | Admitting: Family Medicine

## 2017-01-04 ENCOUNTER — Ambulatory Visit (INDEPENDENT_AMBULATORY_CARE_PROVIDER_SITE_OTHER): Payer: PPO | Admitting: Family Medicine

## 2017-01-04 VITALS — BP 140/76 | HR 96 | Temp 98.6°F | Resp 17 | Ht 71.0 in | Wt 226.3 lb

## 2017-01-04 DIAGNOSIS — E1165 Type 2 diabetes mellitus with hyperglycemia: Secondary | ICD-10-CM

## 2017-01-04 DIAGNOSIS — IMO0001 Reserved for inherently not codable concepts without codable children: Secondary | ICD-10-CM

## 2017-01-04 DIAGNOSIS — E7800 Pure hypercholesterolemia, unspecified: Secondary | ICD-10-CM | POA: Diagnosis not present

## 2017-01-04 DIAGNOSIS — I10 Essential (primary) hypertension: Secondary | ICD-10-CM

## 2017-01-04 DIAGNOSIS — Z794 Long term (current) use of insulin: Secondary | ICD-10-CM

## 2017-01-04 LAB — GLUCOSE, POCT (MANUAL RESULT ENTRY): POC Glucose: 549 mg/dl — AB (ref 70–99)

## 2017-01-04 LAB — POCT GLYCOSYLATED HEMOGLOBIN (HGB A1C): HEMOGLOBIN A1C: 14

## 2017-01-04 NOTE — Progress Notes (Signed)
Name: Bryan Flowers   MRN: [id number]    DOB: September 02, 1961   Date:01/04/2017       Progress Note  Subjective  Chief Complaint  Chief Complaint  Patient presents with  . Follow-up    2 mo    Diabetes  He presents for his follow-up diabetic visit. He has type 2 diabetes mellitus. His disease course has been worsening (worsening blood glucose due to stress from his wife's mental illness and suicidal ideation.). There are no hypoglycemic associated symptoms. Pertinent negatives for hypoglycemia include no headaches. Associated symptoms include fatigue, polydipsia and polyuria. Pertinent negatives for diabetes include no blurred vision and no chest pain. There are no hypoglycemic complications. Symptoms are worsening. Pertinent negatives for diabetic complications include no CVA. Current diabetic treatment includes oral agent (dual therapy) and intensive insulin program. He is following a diabetic diet. He participates in exercise three times a week (Walks three times a week). His breakfast blood glucose range is generally >200 mg/dl. An ACE inhibitor/angiotensin II receptor blocker is being taken. Eye exam is current.  Hyperlipidemia  This is a chronic problem. The problem is controlled. Pertinent negatives include no chest pain, leg pain, myalgias or shortness of breath. Current antihyperlipidemic treatment includes statins and fibric acid derivatives.  Hypertension  This is a chronic problem. The problem is unchanged. The problem is controlled. Pertinent negatives include no blurred vision, chest pain, headaches, palpitations or shortness of breath. Past treatments include ACE inhibitors and diuretics. There is no history of kidney disease, CAD/MI or CVA.     Past Medical History:  Diagnosis Date  . Anemia    iron deficiency  . Bipolar 1 disorder, manic, moderate ([place])   . Depression   . Diabetes mellitus without complication ([place])   . GERD (gastroesophageal reflux disease)   .  Hyperlipidemia   . Hypertension   . Insomnia   . Parkinson disease ([place])   . Peptic ulcer disease with hemorrhage     Past Surgical History:  Procedure Laterality Date  . ESOPHAGOGASTRODUODENOSCOPY Left 03/16/2016   Procedure: ESOPHAGOGASTRODUODENOSCOPY (EGD);  Surgeon: Manus Gunning, MD;  Location: [facility];  Service: Gastroenterology;  Laterality: Left;  . FOOT FRACTURE SURGERY Right     Family History  Problem Relation Age of Onset  . Cancer Mother   . Heart disease Father     Social History   Social History  . Marital status: Married    Spouse name: N/A  . Number of children: N/A  . Years of education: N/A   Occupational History  . Not on file.   Social History Main Topics  . Smoking status: Never Smoker  . Smokeless tobacco: Never Used  . Alcohol use No  . Drug use: No  . Sexual activity: Yes    Partners: Female   Other Topics Concern  . Not on file   Social History Narrative  . No narrative on file     Current Outpatient Prescriptions:  .  acetaminophen (TYLENOL) 500 MG tablet, Take 1,000 mg by mouth every 6 (six) hours as needed (pain)., Disp: , Rfl:  .  Armodafinil 150 MG tablet, Take 150 mg by mouth daily. Nuvigil, Disp: , Rfl:  .  b complex vitamins capsule, Take 1 capsule by mouth daily., Disp: , Rfl:  .  carbidopa-levodopa (SINEMET CR) 50-200 MG per tablet, Take 1 tablet by mouth 3 (three) times daily. , Disp: , Rfl:  .  DULoxetine (CYMBALTA) 60 MG capsule, Take 120 mg by  mouth at bedtime. , Disp: , Rfl:  .  glipiZIDE (GLUCOTROL) 5 MG tablet, Take 1 tablet (5 mg total) by mouth daily before breakfast., Disp: 90 tablet, Rfl: 0 .  Insulin Degludec-Liraglutide (XULTOPHY) 100-3.6 UNIT-MG/ML SOPN, Inject 16 Units into the skin at bedtime., Disp: 15 mL, Rfl: 2 .  lamoTRIgine (LAMICTAL) 200 MG tablet, Take 200 mg by mouth at bedtime. , Disp: , Rfl:  .  LORazepam (ATIVAN) 0.5 MG tablet, Take 0.5 mg by mouth 3 (three) times daily as needed for  anxiety. , Disp: , Rfl:  .  LYRICA 25 MG capsule, TK 1 C PO QHS FOR 1 WEEK THEN INCREASE TO 2 CS QHS, Disp: , Rfl: 2 .  metFORMIN (GLUCOPHAGE) 1000 MG tablet, Take 1 tablet (1,000 mg total) by mouth 2 (two) times daily with a meal., Disp: 180 tablet, Rfl: 0 .  pantoprazole (PROTONIX) 40 MG tablet, Take 1 tablet (40 mg total) by mouth daily., Disp: 30 tablet, Rfl: 0 .  propranolol ER (INDERAL LA) 60 MG 24 hr capsule, Take 60 mg by mouth at bedtime. , Disp: , Rfl:  .  QUEtiapine (SEROQUEL XR) 400 MG 24 hr tablet, TK 1 T PO QD AT 8PM ONE HOUR BEFORE OR AFTER A MEAL, Disp: , Rfl: 2 .  QUEtiapine (SEROQUEL) 300 MG tablet, Take 300 mg by mouth at bedtime., Disp: , Rfl: 5 .  quinapril-hydrochlorothiazide (ACCURETIC) 20-12.5 MG tablet, TAKE 2 TABLETS BY MOUTH DAILY, Disp: 180 tablet, Rfl: 0 .  rOPINIRole (REQUIP) 1 MG tablet, Take 1 mg by mouth at bedtime., Disp: , Rfl:  .  simvastatin (ZOCOR) 40 MG tablet, TAKE 1 TABLET BY MOUTH AT BEDTIME, Disp: 90 tablet, Rfl: 0 .  tiZANidine (ZANAFLEX) 2 MG tablet, Take 1 tablet (2 mg total) by mouth every 8 (eight) hours as needed for muscle spasms., Disp: 30 tablet, Rfl: 0 .  Vitamin D, Ergocalciferol, (DRISDOL) 50000 units CAPS capsule, Take 1 capsule (50,000 Units total) by mouth once a week. For 12 weeks, Disp: 12 capsule, Rfl: 0  Current Facility-Administered Medications:  .  0.9 %  sodium chloride infusion, 500 mL, Intravenous, Continuous, Manus Gunning, MD  Facility-Administered Medications Ordered in Other Visits:  .  0.9 %  sodium chloride infusion, , Intravenous, Once, Brunetta Genera, MD .  alteplase (CATHFLO ACTIVASE) injection 2 mg, 2 mg, Intracatheter, Once PRN, Brunetta Genera, MD .  heparin lock flush 100 unit/mL, 500 Units, Intracatheter, Once PRN, Brunetta Genera, MD .  heparin lock flush 100 unit/mL, 250 Units, Intracatheter, Once PRN, Brunetta Genera, MD .  sodium chloride 0.9 % injection 10 mL, 10 mL, Intracatheter,  PRN, Brunetta Genera, MD .  sodium chloride 0.9 % injection 3 mL, 3 mL, Intravenous, Once PRN, Brunetta Genera, MD  No Known Allergies   Review of Systems  Constitutional: Positive for fatigue.  Eyes: Negative for blurred vision.  Respiratory: Negative for shortness of breath.   Cardiovascular: Negative for chest pain and palpitations.  Musculoskeletal: Negative for myalgias.  Neurological: Negative for headaches.  Endo/Heme/Allergies: Positive for polydipsia.     Objective  Vitals:   01/04/17 1333  BP: 140/76  Pulse: 96  Resp: 17  Temp: 98.6 F (37 C)  TempSrc: Oral  SpO2: 96%  Weight: 226 lb 4.8 oz (102.6 kg)  Height: 5\' 11"  (1.803 m)    Physical Exam  Constitutional: He is oriented to person, place, and time and well-developed, well-nourished, and in no distress.  HENT:  Head: Normocephalic and atraumatic.  Cardiovascular: Normal rate, regular rhythm and normal heart sounds.   No murmur heard. Pulmonary/Chest: Effort normal and breath sounds normal. He has no wheezes.  Abdominal: Soft. Bowel sounds are normal. There is no tenderness.  Musculoskeletal: He exhibits no edema.  Neurological: He is alert and oriented to person, place, and time.  Psychiatric: Mood, memory, affect and judgment normal.  Nursing note and vitals reviewed.   Recent Results (from the past 2160 hour(s))  POCT Glucose (CBG)     Status: Abnormal   Collection Time: 01/04/17  1:42 PM  Result Value Ref Range   POC Glucose 549 (A) 70 - 99 mg/dl  POCT HgB A1C     Status: Abnormal   Collection Time: 01/04/17  1:43 PM  Result Value Ref Range   Hemoglobin A1C 14.0      Assessment & Plan  1. Uncontrolled type 2 diabetes mellitus without complication, with long-term current use of insulin (HCC) A1c is over 14%, glucose is 549 MG per DL, patient has been under considerable stress because of the worsening mental health of his wife, is concerned that they may soon be placed in assisted  living and he may lose his house. Advised to increase Xultophy to 30 units total by going up 2 units every 3-4 days, and keep BG logs. Follow-up in one month - POCT HgB A1C - POCT Glucose (CBG)  2. Pure hypercholesterolemia Obtain FLP, and adjust statin therapy as indicated - Lipid panel - COMPLETE METABOLIC PANEL WITH GFR  3. Essential hypertension Blood pressure is stable on present and hypertensive therapy   Rozlynn Lippold [facility] 01/04/2017 1:49 PM

## 2017-01-12 ENCOUNTER — Telehealth: Payer: Self-pay | Admitting: Family Medicine

## 2017-01-12 NOTE — Telephone Encounter (Signed)
I need patient's written permission before I can talk to the Department of Social Services

## 2017-01-12 NOTE — Telephone Encounter (Signed)
Bryan Flowers with [facility] Department is needing to speak with Dr. [name] in regards to Bryan Flowers' health condition.  Please contact Bryan Flowers at [phone] as soon as possible.

## 2017-01-18 NOTE — Telephone Encounter (Signed)
The medical release from [facility]. Of  Services is already attached to chart.

## 2017-01-19 NOTE — Telephone Encounter (Addendum)
Returned call and left a voice message for Brittney to call back regarding this patient

## 2017-01-20 ENCOUNTER — Emergency Department
Admission: EM | Admit: 2017-01-20 | Discharge: 2017-01-21 | Disposition: A | Discharge status: Home / Self Care | Payer: PPO | Attending: Emergency Medicine | Admitting: Emergency Medicine

## 2017-01-20 DIAGNOSIS — Z79899 Other long term (current) drug therapy: Secondary | ICD-10-CM | POA: Insufficient documentation

## 2017-01-20 DIAGNOSIS — E1165 Type 2 diabetes mellitus with hyperglycemia: Secondary | ICD-10-CM | POA: Diagnosis not present

## 2017-01-20 DIAGNOSIS — F99 Mental disorder, not otherwise specified: Secondary | ICD-10-CM | POA: Diagnosis not present

## 2017-01-20 DIAGNOSIS — F32A Depression, unspecified: Secondary | ICD-10-CM

## 2017-01-20 DIAGNOSIS — I10 Essential (primary) hypertension: Secondary | ICD-10-CM | POA: Diagnosis not present

## 2017-01-20 DIAGNOSIS — Z794 Long term (current) use of insulin: Secondary | ICD-10-CM | POA: Insufficient documentation

## 2017-01-20 DIAGNOSIS — R45851 Suicidal ideations: Secondary | ICD-10-CM | POA: Diagnosis not present

## 2017-01-20 DIAGNOSIS — G20 Parkinson's disease: Secondary | ICD-10-CM | POA: Diagnosis not present

## 2017-01-20 DIAGNOSIS — F329 Major depressive disorder, single episode, unspecified: Secondary | ICD-10-CM | POA: Insufficient documentation

## 2017-01-20 LAB — COMPREHENSIVE METABOLIC PANEL
ALBUMIN: 4.8 g/dL (ref 3.5–5.0)
ALK PHOS: 63 U/L (ref 38–126)
ALT: 23 U/L (ref 17–63)
ANION GAP: 12 (ref 5–15)
AST: 24 U/L (ref 15–41)
BILIRUBIN TOTAL: 0.8 mg/dL (ref 0.3–1.2)
BUN: 24 mg/dL — AB (ref 6–20)
CALCIUM: 9.5 mg/dL (ref 8.9–10.3)
CO2: 26 mmol/L (ref 22–32)
Chloride: 95 mmol/L — ABNORMAL LOW (ref 101–111)
Creatinine, Ser: 1.02 mg/dL (ref 0.61–1.24)
GFR calc Af Amer: >60 mL/min (ref >60)
GFR calc non Af Amer: >60 mL/min (ref >60)
GLUCOSE: 336 mg/dL — AB (ref 65–99)
Potassium: 4 mmol/L (ref 3.5–5.1)
SODIUM: 133 mmol/L — AB (ref 135–145)
TOTAL PROTEIN: 8.1 g/dL (ref 6.5–8.1)

## 2017-01-20 LAB — CBC
HEMATOCRIT: 41.2 % (ref 40.0–52.0)
HEMOGLOBIN: 14.2 g/dL (ref 13.0–18.0)
MCH: 29.9 pg (ref 26.0–34.0)
MCHC: 34.6 g/dL (ref 32.0–36.0)
MCV: 86.5 fL (ref 80.0–100.0)
Platelets: 298 10*3/uL (ref 150–440)
RBC: 4.77 MIL/uL (ref 4.40–5.90)
RDW: 13.5 % (ref 11.5–14.5)
WBC: 6.5 10*3/uL (ref 3.8–10.6)

## 2017-01-20 LAB — GLUCOSE, CAPILLARY: Glucose-Capillary: 365 mg/dL — ABNORMAL HIGH (ref 65–99)

## 2017-01-20 NOTE — ED Notes (Signed)
Pt reports he is under "a lot" of stressors right now. Pt states he is trying to find a place for him and his wife to live safe and is afraid they are about to loose the house they are currently living it. Pt states is has become too much and states he was having suicidal thought but does not want to die and knew he needed to come in to get help.

## 2017-01-20 NOTE — ED Triage Notes (Signed)
Pt arrived via ems from home. Pt's wife recently diagnosed with Alzheimer's and became overwhelmed. Pt reported to ems wanting to come in before hitting a breaking point.

## 2017-01-20 NOTE — ED Notes (Signed)
Pt placed in paper scrubs with the assistance of [facility]. Belongings placed in a belonging bag.

## 2017-01-20 NOTE — ED Notes (Signed)
Pt ambulated to the bathroom independently with a steady gait.  

## 2017-01-21 LAB — URINE DRUG SCREEN, QUALITATIVE (ARMC ONLY)
AMPHETAMINES, UR SCREEN: NOT DETECTED
Barbiturates, Ur Screen: NOT DETECTED
Benzodiazepine, Ur Scrn: NOT DETECTED
Cannabinoid 50 Ng, Ur ~~LOC~~: NOT DETECTED
Cocaine Metabolite,Ur ~~LOC~~: NOT DETECTED
MDMA (ECSTASY) UR SCREEN: NOT DETECTED
Methadone Scn, Ur: NOT DETECTED
Opiate, Ur Screen: NOT DETECTED
Phencyclidine (PCP) Ur S: NOT DETECTED
Tricyclic, Ur Screen: NOT DETECTED

## 2017-01-21 LAB — ETHANOL

## 2017-01-21 LAB — ACETAMINOPHEN LEVEL

## 2017-01-21 LAB — SALICYLATE LEVEL: Salicylate Lvl: <7 mg/dL (ref 2.8–30.0)

## 2017-01-21 NOTE — ED Notes (Signed)
MD made aware of blood sugar level and no compliance with diabetic medications. Pt in no acute distress. No new orders at this time.

## 2017-01-21 NOTE — Discharge Instructions (Addendum)
You have been seen in the Emergency Department (ED) today for a psychiatric complaint.  You have been evaluated by psychiatry and we believe you are safe to be discharged from the hospital.    It is also VERY important that you resume taking your regular diabetes medications.  Please take your medications as prescribed and follow up with your regular doctor if you have any questions.  Please return to the ED immediately if you have ANY thoughts of hurting yourself or anyone else, so that we may help you.  Please avoid alcohol and drug use.  Follow up with your doctor and/or therapist as soon as possible regarding today's ED visit.   Please follow up any other recommendations and clinic appointments provided by the psychiatry team that saw you in the Emergency Department.

## 2017-01-21 NOTE — ED Provider Notes (Signed)
[facility] Emergency Department Provider Note  ____________________________________________   First MD Initiated Contact with Patient 01/20/17 2349     (approximate)  I have reviewed the triage vital signs and the nursing notes.   HISTORY  Chief Complaint Anxiety and Depression    HPI Bryan Flowers is a 56 y.o. male who reports a history that includes bipolar disorder and depression as well as Parkinson's disease and insulin-dependent diabetes whopresents by EMS due to concerns that he might harm himself.  He reports that his wife was recentlywith Alzheimer's dementia and will require placement.  His and her doctor are working on finding a facility where both the patient and his wife cannot live because he has Parkinson's disease, and their situation "hit me out once" tonight.  He became very depressed and upset and was having vague suicidal thoughts without a specific plan.  He did not feel safe at home and was worried he might hurt himself even though he does not want to and does not want to die.  He felt like he should come in for help.  He goes to a psychiatrist in [place] and does take medications to help with depression and bipolar disorder as well as Parkinson's disease.  He has not been ill recently and has no medical complaints as listed below and the review of systems.  He describes his symptoms as being relatively acute in onset but he feels better now.  They were severe and now they are moderate.   Past Medical History:  Diagnosis Date  . Anemia    iron deficiency  . Bipolar 1 disorder, manic, moderate ([place])   . Depression   . Diabetes mellitus without complication ([place])   . GERD (gastroesophageal reflux disease)   . Hyperlipidemia   . Hypertension   . Insomnia   . Parkinson disease ([place])   . Peptic ulcer disease with hemorrhage     Patient Active Problem List   Diagnosis Date Noted  . Annual physical exam 10/01/2016  . Bilateral  leg pain 08/20/2016  . Musculoskeletal leg pain, left 07/21/2016  . Fatigue 05/18/2016  . Sore throat 04/16/2016  . Iron deficiency anemia 03/30/2016  . Normocytic anemia due to blood loss 03/27/2016  . Anemia associated with acute blood loss 03/24/2016  . Multiple gastric ulcers   . Duodenal ulcer   . Anemia 03/15/2016  . GI bleed 03/15/2016  . Hyponatremia 03/15/2016  . Acute blood loss anemia 03/15/2016  . Parkinson disease ([place])   . Hypertension   . Subacute maxillary sinusitis 02/27/2016  . Anxiety 08/19/2015  . Type 2 diabetes mellitus ([place]) 08/19/2015  . Abnormal ECG 04/09/2015  . Back ache 04/09/2015  . Clinical depression 04/09/2015  . Uncontrolled type 2 diabetes mellitus ([place]) 04/09/2015  . Obstructive sleep apnea 04/09/2015  . HDL deficiency 04/09/2015  . Hearing loss, sensorineural, combined types 04/09/2015  . HLD (hyperlipidemia) 04/09/2015  . BP (high blood pressure) 04/09/2015  . Idiopathic Parkinson's disease ([place]) 04/09/2015  . Fibrositis 03/15/2014  . Fibromyalgia 03/15/2014  . Bipolar I disorder, single manic episode, moderate ([place]) 03/17/2010  . Testicular hypofunction 05/17/2009  . Migraine with aura 05/14/2009  . Anxiety state 03/19/2008  . Cannot sleep 03/05/2008  . Reflux esophagitis 07/12/2007    Past Surgical History:  Procedure Laterality Date  . ESOPHAGOGASTRODUODENOSCOPY Left 03/16/2016   Procedure: ESOPHAGOGASTRODUODENOSCOPY (EGD);  Surgeon: Manus Gunning, MD;  Location: [facility];  Service: Gastroenterology;  Laterality: Left;  . FOOT FRACTURE  SURGERY Right     Prior to Admission medications   Medication Sig Start Date End Date Taking? Authorizing Provider  DULoxetine (CYMBALTA) 60 MG capsule Take 120 mg by mouth at bedtime.  03/15/15  Yes Historical Provider, MD  lamoTRIgine (LAMICTAL) 200 MG tablet Take 200 mg by mouth at bedtime.  02/24/15  Yes Historical Provider, MD  LORazepam (ATIVAN) 0.5 MG tablet Take 0.5 mg by mouth 3  (three) times daily as needed for anxiety.  03/13/15  Yes Historical Provider, MD  LYRICA 50 MG capsule tk 1 capsule at bedtime 09/10/16  Yes Historical Provider, MD  propranolol ER (INDERAL LA) 60 MG 24 hr capsule Take 60 mg by mouth at bedtime.  02/24/15  Yes Historical Provider, MD  QUEtiapine (SEROQUEL XR) 400 MG 24 hr tablet TK 1 T PO QD AT 8PM ONE HOUR BEFORE OR AFTER A MEAL 09/10/16  Yes Historical Provider, MD  rOPINIRole (REQUIP) 1 MG tablet Take 1 mg by mouth at bedtime.   Yes Historical Provider, MD  acetaminophen (TYLENOL) 500 MG tablet Take 1,000 mg by mouth every 6 (six) hours as needed (pain).    Historical Provider, MD  Armodafinil 150 MG tablet Take 150 mg by mouth daily. Nuvigil    Historical Provider, MD  b complex vitamins capsule Take 1 capsule by mouth daily. Patient not taking: Reported on 01/20/2017 03/30/16   Brunetta Genera, MD  carbidopa-levodopa (SINEMET CR) 50-200 MG per tablet Take 1 tablet by mouth 3 (three) times daily.  03/08/15   Historical Provider, MD  glipiZIDE (GLUCOTROL) 5 MG tablet Take 1 tablet (5 mg total) by mouth daily before breakfast. Patient not taking: Reported on 01/20/2017 11/02/16   Roselee Nova, MD  Insulin Degludec-Liraglutide (XULTOPHY) 100-3.6 UNIT-MG/ML SOPN Inject 16 Units into the skin at bedtime. Patient not taking: Reported on 01/20/2017 10/01/16   Roselee Nova, MD  metFORMIN (GLUCOPHAGE) 1000 MG tablet Take 1 tablet (1,000 mg total) by mouth 2 (two) times daily with a meal. Patient not taking: Reported on 01/20/2017 11/02/16   Roselee Nova, MD  pantoprazole (PROTONIX) 40 MG tablet Take 1 tablet (40 mg total) by mouth daily. Patient not taking: Reported on 01/20/2017 06/01/16   Manus Gunning, MD  quinapril-hydrochlorothiazide (ACCURETIC) 20-12.5 MG tablet TAKE 2 TABLETS BY MOUTH DAILY Patient not taking: Reported on 01/20/2017 09/01/16   Roselee Nova, MD  simvastatin (ZOCOR) 40 MG tablet TAKE 1 TABLET BY MOUTH AT  BEDTIME Patient not taking: Reported on 01/20/2017 04/24/16   Roselee Nova, MD  tiZANidine (ZANAFLEX) 2 MG tablet Take 1 tablet (2 mg total) by mouth every 8 (eight) hours as needed for muscle spasms. Patient not taking: Reported on 01/20/2017 07/21/16   Roselee Nova, MD  Vitamin D, Ergocalciferol, (DRISDOL) 50000 units CAPS capsule Take 1 capsule (50,000 Units total) by mouth once a week. For 12 weeks Patient not taking: Reported on 01/20/2017 08/24/16   Roselee Nova, MD    Allergies Patient has no known allergies.  Family History  Problem Relation Age of Onset  . Cancer Mother   . Heart disease Father     Social History Social History  Substance Use Topics  . Smoking status: Never Smoker  . Smokeless tobacco: Never Used  . Alcohol use No    Review of Systems Constitutional: No fever/chills Eyes: No visual changes. ENT: No sore throat. Cardiovascular: Denies chest pain. Respiratory: Denies shortness of breath.  Gastrointestinal: No abdominal pain.  No nausea, no vomiting.  No diarrhea.  No constipation. Genitourinary: Negative for dysuria. Musculoskeletal: Negative for back pain. Skin: Negative for rash. Neurological: Negative for headaches, focal weakness or numbness. Psychiatric:Depression with an acute "episode" of anxiety and depression where he briefly had suicidal thoughts without a specific plan, now is willing to contract for safety  10-point ROS otherwise negative.  ____________________________________________   PHYSICAL EXAM:  VITAL SIGNS: ED Triage Vitals  Enc Vitals Group     BP 01/20/17 2323 (!) 178/87     Pulse Rate 01/20/17 2323 93     Resp 01/20/17 2323 20     Temp 01/20/17 2323 98.6 F (37 C)     Temp Source 01/20/17 2323 Oral     SpO2 01/20/17 2323 97 %     Weight 01/20/17 2324 235 lb (106.6 kg)     Height 01/20/17 2324 5\' 11"  (1.803 m)     Head Circumference --      Peak Flow --      Pain Score --      Pain Loc --      Pain Edu?  --      Excl. in [place]? --     Constitutional: Alert and oriented. Well appearing and in no acute distress. Eyes: Conjunctivae are normal. PERRL. EOMI. Head: Atraumatic. Nose: No congestion/rhinnorhea. Mouth/Throat: Mucous membranes are moist. Neck: No stridor.  No meningeal signs.   Cardiovascular: Normal rate, regular rhythm. Good peripheral circulation. Grossly normal heart sounds. Respiratory: Normal respiratory effort.  No retractions. Lungs CTAB. Gastrointestinal: Soft and nontender. No distention.  Musculoskeletal: No lower extremity tenderness nor edema. No gross deformities of extremities. Neurologic:  Normal speech and language. No gross focal neurologic deficits are appreciated.  Skin:  Skin is warm, dry and intact. No rash noted. Psychiatric: Mood and affect are normal. Speech and behavior are normal.  Admits to depression but denies suicidal ideation, homicidal ideation, and appears to have good insight and judgment into his situation and has decision-making capacity  ____________________________________________   LABS (all labs ordered are listed, but only abnormal results are displayed)  Labs Reviewed  COMPREHENSIVE METABOLIC PANEL - Abnormal; Notable for the following:       Result Value   Sodium 133 (*)    Chloride 95 (*)    Glucose, Bld 336 (*)    BUN 24 (*)    All other components within normal limits  ACETAMINOPHEN LEVEL - Abnormal; Notable for the following:    Acetaminophen (Tylenol), Serum <10 (*)    All other components within normal limits  GLUCOSE, CAPILLARY - Abnormal; Notable for the following:    Glucose-Capillary 365 (*)    All other components within normal limits  ETHANOL  SALICYLATE LEVEL  CBC  URINE DRUG SCREEN, QUALITATIVE ([facility] ONLY)   ____________________________________________  EKG  None - EKG not ordered by ED physician ____________________________________________  RADIOLOGY   No results  found.  ____________________________________________   PROCEDURES  Critical Care performed: No   Procedure(s) performed:   Procedures   ____________________________________________   INITIAL IMPRESSION / ASSESSMENT AND PLAN / ED COURSE  Pertinent labs & imaging results that were available during my care of the patient were reviewed by me and considered in my medical decision making (see chart for details).  Patient has been noncompliant with his diabetes medications recently and he does have hyperglycemia although he is asymptomatic.  It sounds as if he has been increasingly and gradually depressed due  to his situation at home with an episode of either panic or anxiety tonight which exacerbated his symptoms.  He is currently calm and cooperative and has capacity to make his own decisions.  I asked him what he felt would be most helpful to him, and he feels that staying in a safe environment and talking to someone would be beneficial.  I will move him to the [place] and out of the hallway in the quad and will consult telepsych for their evaluation.  In my opinion he does not meet commitment criteria.   Clinical Course as of Jan 21 649  Thu Jan 21, 2017  0425 I reviewed the written report from the psychiatrist on call who agreed with my assessment that the patient does not meet inpatient nor involuntary commitment criteria.  He agreed with the plan for the patient to follow up with outpatient psychiatry particularly since he is already established.  Given that it is 4:30 in the morning and the patient felt like he needed a safe place to stay tonight, I will discharge him with shift change in the morning.  [CF]  [phone] The patient states that he feels better and feels like he is ready and safe to go home.  I provided outpatient follow-up information as we discussed previously.  [CF]    Clinical Course User Index [CF] Hinda Kehr, MD    ____________________________________________  FINAL  CLINICAL IMPRESSION(S) / ED DIAGNOSES  Final diagnoses:  Depression, unspecified depression type  Type 2 diabetes mellitus with hyperglycemia, with long-term current use of insulin ([place])     MEDICATIONS GIVEN DURING THIS VISIT:  Medications - No data to display   NEW OUTPATIENT MEDICATIONS STARTED DURING THIS VISIT:  New Prescriptions   No medications on file    Modified Medications   No medications on file    Discontinued Medications   QUETIAPINE (SEROQUEL) 300 MG TABLET    Take 300 mg by mouth at bedtime.     Note:  This document was prepared using Dragon voice recognition software and may include unintentional dictation errors.    Hinda Kehr, MD 01/21/17 [phone]

## 2017-01-25 DIAGNOSIS — G2581 Restless legs syndrome: Secondary | ICD-10-CM | POA: Diagnosis not present

## 2017-01-25 DIAGNOSIS — F3181 Bipolar II disorder: Secondary | ICD-10-CM | POA: Diagnosis not present

## 2017-01-25 DIAGNOSIS — G4726 Circadian rhythm sleep disorder, shift work type: Secondary | ICD-10-CM | POA: Diagnosis not present

## 2017-01-26 ENCOUNTER — Encounter: Payer: Self-pay | Admitting: Family Medicine

## 2017-01-26 ENCOUNTER — Ambulatory Visit (INDEPENDENT_AMBULATORY_CARE_PROVIDER_SITE_OTHER): Payer: PPO | Admitting: Family Medicine

## 2017-01-26 VITALS — BP 158/76 | HR 94 | Temp 98.0°F | Resp 17 | Ht 71.0 in | Wt 230.3 lb

## 2017-01-26 DIAGNOSIS — Z022 Encounter for examination for admission to residential institution: Secondary | ICD-10-CM

## 2017-01-26 NOTE — Progress Notes (Signed)
Name: Bryan Flowers   MRN: [id number]    DOB: [birth year]   Date:01/26/2017       Progress Note  Subjective  Chief Complaint  Chief Complaint  Patient presents with  . Follow-up    FL-2 Forms    HPI  Patient presents to fill out the [place] to form for placement in an assisted living facility, he has multiple medical conditions including bipolar disorder, Parkinson's, poorly controlled diabetes, fibromyalgia, and history of anemia. His wife has multiple medical problems including posttraumatic stress disorder and dementia and Bryan Flowers is working with the patient's [facility] place in assisted living, patient feels that he is unable to care for his wife or himself in his current state of health, is willing to go to assisted living with his wife.  Past Medical History:  Diagnosis Date  . Anemia    iron deficiency  . Bipolar 1 disorder, manic, moderate ([place])   . Depression   . Diabetes mellitus without complication ([place])   . GERD (gastroesophageal reflux disease)   . Hyperlipidemia   . Hypertension   . Insomnia   . Parkinson disease ([place])   . Peptic ulcer disease with hemorrhage     Past Surgical History:  Procedure Laterality Date  . ESOPHAGOGASTRODUODENOSCOPY Left 03/16/2016   Procedure: ESOPHAGOGASTRODUODENOSCOPY (EGD);  Surgeon: Manus Gunning, MD;  Location: [facility];  Service: Gastroenterology;  Laterality: Left;  . FOOT FRACTURE SURGERY Right     Family History  Problem Relation Age of Onset  . Cancer Mother   . Heart disease Father     Social History   Social History  . Marital status: Married    Spouse name: N/A  . Number of children: N/A  . Years of education: N/A   Occupational History  . Not on file.   Social History Main Topics  . Smoking status: Never Smoker  . Smokeless tobacco: Never Used  . Alcohol use No  . Drug use: No  . Sexual activity: Yes    Partners: Female   Other Topics Concern  . Not on file    Social History Narrative  . No narrative on file     Current Outpatient Prescriptions:  .  acetaminophen (TYLENOL) 500 MG tablet, Take 1,000 mg by mouth every 6 (six) hours as needed (pain)., Disp: , Rfl:  .  Armodafinil 150 MG tablet, Take 150 mg by mouth daily. Nuvigil, Disp: , Rfl:  .  b complex vitamins capsule, Take 1 capsule by mouth daily., Disp: , Rfl:  .  carbidopa-levodopa (SINEMET CR) 50-200 MG per tablet, Take 1 tablet by mouth 3 (three) times daily. , Disp: , Rfl:  .  DULoxetine (CYMBALTA) 60 MG capsule, Take 120 mg by mouth at bedtime. , Disp: , Rfl:  .  glipiZIDE (GLUCOTROL) 5 MG tablet, Take 1 tablet (5 mg total) by mouth daily before breakfast., Disp: 90 tablet, Rfl: 0 .  Insulin Degludec-Liraglutide (XULTOPHY) 100-3.6 UNIT-MG/ML SOPN, Inject 16 Units into the skin at bedtime., Disp: 15 mL, Rfl: 2 .  lamoTRIgine (LAMICTAL) 200 MG tablet, Take 200 mg by mouth at bedtime. , Disp: , Rfl:  .  LORazepam (ATIVAN) 0.5 MG tablet, Take 0.5 mg by mouth 3 (three) times daily as needed for anxiety. , Disp: , Rfl:  .  LYRICA 50 MG capsule, tk 1 capsule at bedtime, Disp: , Rfl: 2 .  metFORMIN (GLUCOPHAGE) 1000 MG tablet, Take 1 tablet (1,000 mg total) by mouth 2 (  two) times daily with a meal., Disp: 180 tablet, Rfl: 0 .  pantoprazole (PROTONIX) 40 MG tablet, Take 1 tablet (40 mg total) by mouth daily., Disp: 30 tablet, Rfl: 0 .  propranolol ER (INDERAL LA) 60 MG 24 hr capsule, Take 60 mg by mouth at bedtime. , Disp: , Rfl:  .  QUEtiapine (SEROQUEL XR) 400 MG 24 hr tablet, TK 1 T PO QD AT 8PM ONE HOUR BEFORE OR AFTER A MEAL, Disp: , Rfl: 2 .  quinapril-hydrochlorothiazide (ACCURETIC) 20-12.5 MG tablet, TAKE 2 TABLETS BY MOUTH DAILY, Disp: 180 tablet, Rfl: 0 .  rOPINIRole (REQUIP) 1 MG tablet, Take 1 mg by mouth at bedtime., Disp: , Rfl:  .  simvastatin (ZOCOR) 40 MG tablet, TAKE 1 TABLET BY MOUTH AT BEDTIME, Disp: 90 tablet, Rfl: 0 .  tiZANidine (ZANAFLEX) 2 MG tablet, Take 1 tablet (2  mg total) by mouth every 8 (eight) hours as needed for muscle spasms., Disp: 30 tablet, Rfl: 0 .  Vitamin D, Ergocalciferol, (DRISDOL) 50000 units CAPS capsule, Take 1 capsule (50,000 Units total) by mouth once a week. For 12 weeks, Disp: 12 capsule, Rfl: 0  Current Facility-Administered Medications:  .  0.9 %  sodium chloride infusion, 500 mL, Intravenous, Continuous, Manus Gunning, MD  Facility-Administered Medications Ordered in Other Visits:  .  0.9 %  sodium chloride infusion, , Intravenous, Once, Brunetta Genera, MD .  alteplase (CATHFLO ACTIVASE) injection 2 mg, 2 mg, Intracatheter, Once PRN, Brunetta Genera, MD .  heparin lock flush 100 unit/mL, 500 Units, Intracatheter, Once PRN, Brunetta Genera, MD .  heparin lock flush 100 unit/mL, 250 Units, Intracatheter, Once PRN, Brunetta Genera, MD .  sodium chloride 0.9 % injection 10 mL, 10 mL, Intracatheter, PRN, Brunetta Genera, MD .  sodium chloride 0.9 % injection 3 mL, 3 mL, Intravenous, Once PRN, Brunetta Genera, MD  No Known Allergies   ROS  Please see history of present illness for complete discussion of ROS  Objective  Vitals:   01/26/17 1427  BP: (!) 158/76  Pulse: 94  Resp: 17  Temp: 98 F (36.7 C)  TempSrc: Oral  SpO2: 96%  Weight: 230 lb 4.8 oz (104.5 kg)  Height: 5\' 11"  (1.803 m)    Physical Exam  Constitutional: He is oriented to person, place, and time and well-developed, well-nourished, and in no distress.  Neurological: He is alert and oriented to person, place, and time.  Psychiatric: Mood, memory, affect and judgment normal.  Nursing note and vitals reviewed.         Assessment & Plan  1. Encounter for examination for admission to assisted living facility Completed and signed the FL-2 form, also spoke with the staff at [facility] and they will decide between the appropriate assisted living facility for placement.  Note total  face-to-face time: 25 minutes, greater than 50% was spent in counseling and coordination of care with the patient, this included reviewing pertinent diagnoses including those being managed by specialist, discussing medications, completing the form.  Bryan Flowers A. [facility] 01/26/2017 2:52 PM

## 2017-02-04 ENCOUNTER — Encounter: Payer: Self-pay | Admitting: *Deleted

## 2017-02-04 DIAGNOSIS — G2581 Restless legs syndrome: Secondary | ICD-10-CM | POA: Diagnosis not present

## 2017-02-04 DIAGNOSIS — G4726 Circadian rhythm sleep disorder, shift work type: Secondary | ICD-10-CM | POA: Diagnosis not present

## 2017-02-04 DIAGNOSIS — F3181 Bipolar II disorder: Secondary | ICD-10-CM | POA: Diagnosis not present

## 2017-02-04 NOTE — Telephone Encounter (Signed)
Phone note created in error. 

## 2017-02-04 NOTE — Telephone Encounter (Signed)
Left message for patient to call back with any questions or concerns. SM

## 2017-02-15 ENCOUNTER — Ambulatory Visit: Payer: PPO | Admitting: Family Medicine

## 2017-02-16 ENCOUNTER — Ambulatory Visit (INDEPENDENT_AMBULATORY_CARE_PROVIDER_SITE_OTHER): Payer: PPO | Admitting: Family Medicine

## 2017-02-16 ENCOUNTER — Encounter: Payer: Self-pay | Admitting: Family Medicine

## 2017-02-16 VITALS — BP 147/72 | HR 105 | Temp 97.5°F | Resp 17 | Ht 71.0 in | Wt 223.4 lb

## 2017-02-16 DIAGNOSIS — Z024 Encounter for examination for driving license: Secondary | ICD-10-CM | POA: Diagnosis not present

## 2017-02-16 DIAGNOSIS — I10 Essential (primary) hypertension: Secondary | ICD-10-CM

## 2017-02-16 NOTE — Progress Notes (Signed)
Name: Bryan Flowers   MRN: [id number]    DOB: 05-Oct-1961   Date:02/16/2017       Progress Note  Subjective  Chief Complaint  Chief Complaint  Patient presents with  . Follow-up    Note for driving    HPI  Pt. presents to obtain a note to be able to drive his car. He has recently moved in at the [facility] facility. He reports that the facility requires for him to have a note from his doctor that he can drive.  Patient has been driving since the age of 68, has no reported accidents in over 20 years. He feels that he is able to operate a motor vehicle  independently and safely.    Past Medical History:  Diagnosis Date  . Anemia    iron deficiency  . Bipolar 1 disorder, manic, moderate ([place])   . Depression   . Diabetes mellitus without complication ([place])   . GERD (gastroesophageal reflux disease)   . Hyperlipidemia   . Hypertension   . Insomnia   . Parkinson disease ([place])   . Peptic ulcer disease with hemorrhage     Past Surgical History:  Procedure Laterality Date  . ESOPHAGOGASTRODUODENOSCOPY Left 03/16/2016   Procedure: ESOPHAGOGASTRODUODENOSCOPY (EGD);  Surgeon: Manus Gunning, MD;  Location: [facility];  Service: Gastroenterology;  Laterality: Left;  . FOOT FRACTURE SURGERY Right     Family History  Problem Relation Age of Onset  . Cancer Mother   . Heart disease Father     Social History   Social History  . Marital status: Married    Spouse name: N/A  . Number of children: N/A  . Years of education: N/A   Occupational History  . Not on file.   Social History Main Topics  . Smoking status: Never Smoker  . Smokeless tobacco: Never Used  . Alcohol use No  . Drug use: No  . Sexual activity: Yes    Partners: Female   Other Topics Concern  . Not on file   Social History Narrative  . No narrative on file     Current Outpatient Prescriptions:  .  acetaminophen (TYLENOL) 500 MG tablet, Take 1,000 mg by mouth every 6  (six) hours as needed (pain)., Disp: , Rfl:  .  Armodafinil 150 MG tablet, Take 150 mg by mouth daily. Nuvigil, Disp: , Rfl:  .  b complex vitamins capsule, Take 1 capsule by mouth daily., Disp: , Rfl:  .  carbidopa-levodopa (SINEMET CR) 50-200 MG per tablet, Take 1 tablet by mouth 3 (three) times daily. , Disp: , Rfl:  .  DULoxetine (CYMBALTA) 60 MG capsule, Take 120 mg by mouth at bedtime. , Disp: , Rfl:  .  glipiZIDE (GLUCOTROL) 5 MG tablet, Take 1 tablet (5 mg total) by mouth daily before breakfast., Disp: 90 tablet, Rfl: 0 .  Insulin Degludec-Liraglutide (XULTOPHY) 100-3.6 UNIT-MG/ML SOPN, Inject 16 Units into the skin at bedtime., Disp: 15 mL, Rfl: 2 .  lamoTRIgine (LAMICTAL) 200 MG tablet, Take 200 mg by mouth at bedtime. , Disp: , Rfl:  .  LORazepam (ATIVAN) 0.5 MG tablet, Take 0.5 mg by mouth 3 (three) times daily as needed for anxiety. , Disp: , Rfl:  .  LYRICA 50 MG capsule, tk 1 capsule at bedtime, Disp: , Rfl: 2 .  metFORMIN (GLUCOPHAGE) 1000 MG tablet, Take 1 tablet (1,000 mg total) by mouth 2 (two) times daily with a meal., Disp: 180 tablet, Rfl:  0 .  pantoprazole (PROTONIX) 40 MG tablet, Take 1 tablet (40 mg total) by mouth daily., Disp: 30 tablet, Rfl: 0 .  propranolol ER (INDERAL LA) 60 MG 24 hr capsule, Take 60 mg by mouth at bedtime. , Disp: , Rfl:  .  QUEtiapine (SEROQUEL XR) 400 MG 24 hr tablet, TK 1 T PO QD AT 8PM ONE HOUR BEFORE OR AFTER A MEAL, Disp: , Rfl: 2 .  quinapril-hydrochlorothiazide (ACCURETIC) 20-12.5 MG tablet, TAKE 2 TABLETS BY MOUTH DAILY, Disp: 180 tablet, Rfl: 0 .  rOPINIRole (REQUIP) 1 MG tablet, Take 1 mg by mouth at bedtime., Disp: , Rfl:  .  simvastatin (ZOCOR) 40 MG tablet, TAKE 1 TABLET BY MOUTH AT BEDTIME, Disp: 90 tablet, Rfl: 0 .  Vitamin D, Ergocalciferol, (DRISDOL) 50000 units CAPS capsule, Take 1 capsule (50,000 Units total) by mouth once a week. For 12 weeks, Disp: 12 capsule, Rfl: 0  Current Facility-Administered Medications:  .  0.9 %  sodium  chloride infusion, 500 mL, Intravenous, Continuous, Armbruster, Renelda Loma, MD  Facility-Administered Medications Ordered in Other Visits:  .  0.9 %  sodium chloride infusion, , Intravenous, Once, Kale, Cloria Spring, MD .  alteplase (CATHFLO ACTIVASE) injection 2 mg, 2 mg, Intracatheter, Once PRN, Brunetta Genera, MD .  heparin lock flush 100 unit/mL, 500 Units, Intracatheter, Once PRN, Irene Limbo, Cloria Spring, MD .  heparin lock flush 100 unit/mL, 250 Units, Intracatheter, Once PRN, Irene Limbo, Cloria Spring, MD .  sodium chloride 0.9 % injection 10 mL, 10 mL, Intracatheter, PRN, Brunetta Genera, MD .  sodium chloride 0.9 % injection 3 mL, 3 mL, Intravenous, Once PRN, Brunetta Genera, MD  No Known Allergies   ROS  Please see history of present illness for complete discussion of ROS  Objective  Vitals:   02/16/17 1405  BP: (!) 147/72  Pulse: (!) 105  Resp: 17  Temp: 97.5 F (36.4 C)  TempSrc: Oral  SpO2: 96%  Weight: 223 lb 6.4 oz (101.3 kg)  Height: 5\' 11"  (1.803 m)    Physical Exam  Constitutional: He is oriented to person, place, and time and well-developed, well-nourished, and in no distress.  HENT:  Head: Normocephalic and atraumatic.  Cardiovascular: Normal rate, regular rhythm and normal heart sounds.   No murmur heard. Pulmonary/Chest: Effort normal and breath sounds normal.  Neurological: He is alert and oriented to person, place, and time.  Nursing note and vitals reviewed.    Assessment & Plan  1. Essential hypertension BP elevated likely because of the increased stress and anxiety associated with taking care of his wife and the recent move into the assisted living facility. Reassess in one month  2. Encounter for examination for driving license Provided a note for him to be able to operate a motor vehicle.   [name] Asad A. [facility] 02/16/2017 2:30 PM

## 2017-02-17 DIAGNOSIS — Z024 Encounter for examination for driving license: Secondary | ICD-10-CM | POA: Insufficient documentation

## 2017-03-03 ENCOUNTER — Telehealth: Payer: Self-pay | Admitting: Family Medicine

## 2017-03-03 NOTE — Telephone Encounter (Signed)
[employer] [place] states that they had dropped of paperwork earlier this month. Asking that you please fill out the form and call them to pick it up. Stated that the paperwork is past due. She can be reached at [phone]

## 2017-03-05 NOTE — Telephone Encounter (Signed)
Dr. [name] please return call: [facility] [place] states that they had dropped of paperwork earlier this month. Asking that you please fill out the form and call them to pick it up. Stated that the paperwork is past due. She can be reached at [phone]

## 2017-03-10 ENCOUNTER — Encounter: Payer: Self-pay | Admitting: Family Medicine

## 2017-03-10 ENCOUNTER — Ambulatory Visit (INDEPENDENT_AMBULATORY_CARE_PROVIDER_SITE_OTHER): Payer: PPO | Admitting: Family Medicine

## 2017-03-10 VITALS — BP 132/74 | HR 96 | Temp 97.7°F | Resp 16 | Ht 71.0 in | Wt 229.8 lb

## 2017-03-10 DIAGNOSIS — Z593 Problems related to living in residential institution: Secondary | ICD-10-CM

## 2017-03-10 NOTE — Progress Notes (Signed)
Name: Bryan Flowers   MRN: [id number]    DOB: November 16, 1960   Date:03/10/2017       Progress Note  Subjective  Chief Complaint  Chief Complaint  Patient presents with  . Paperwork    HPI  Patient presents to complete paperwork associated with admission to assisted living facility. He has moved into the [facility] since last month, place to be doing well.  Past Medical History:  Diagnosis Date  . Anemia    iron deficiency  . Bipolar 1 disorder, manic, moderate ([place])   . Depression   . Diabetes mellitus without complication ([place])   . GERD (gastroesophageal reflux disease)   . Hyperlipidemia   . Hypertension   . Insomnia   . Parkinson disease ([place])   . Peptic ulcer disease with hemorrhage     Past Surgical History:  Procedure Laterality Date  . ESOPHAGOGASTRODUODENOSCOPY Left 03/16/2016   Procedure: ESOPHAGOGASTRODUODENOSCOPY (EGD);  Surgeon: Manus Gunning, MD;  Location: [facility];  Service: Gastroenterology;  Laterality: Left;  . FOOT FRACTURE SURGERY Right     Family History  Problem Relation Age of Onset  . Cancer Mother   . Heart disease Father     Social History   Social History  . Marital status: Married    Spouse name: N/A  . Number of children: N/A  . Years of education: N/A   Occupational History  . Not on file.   Social History Main Topics  . Smoking status: Never Smoker  . Smokeless tobacco: Never Used  . Alcohol use No  . Drug use: No  . Sexual activity: Yes    Partners: Female   Other Topics Concern  . Not on file   Social History Narrative  . No narrative on file     Current Outpatient Prescriptions:  .  acetaminophen (TYLENOL) 500 MG tablet, Take 1,000 mg by mouth every 6 (six) hours as needed (pain)., Disp: , Rfl:  .  Armodafinil 150 MG tablet, Take 150 mg by mouth daily. Nuvigil, Disp: , Rfl:  .  b complex vitamins capsule, Take 1 capsule by mouth daily., Disp: , Rfl:  .  carbidopa-levodopa (SINEMET CR) 50-200 MG  per tablet, Take 1 tablet by mouth 3 (three) times daily. , Disp: , Rfl:  .  DULoxetine (CYMBALTA) 60 MG capsule, Take 120 mg by mouth at bedtime. , Disp: , Rfl:  .  glipiZIDE (GLUCOTROL) 5 MG tablet, Take 1 tablet (5 mg total) by mouth daily before breakfast., Disp: 90 tablet, Rfl: 0 .  Insulin Degludec-Liraglutide (XULTOPHY) 100-3.6 UNIT-MG/ML SOPN, Inject 16 Units into the skin at bedtime., Disp: 15 mL, Rfl: 2 .  lamoTRIgine (LAMICTAL) 200 MG tablet, Take 200 mg by mouth at bedtime. , Disp: , Rfl:  .  LORazepam (ATIVAN) 0.5 MG tablet, Take 0.5 mg by mouth 3 (three) times daily as needed for anxiety. , Disp: , Rfl:  .  LYRICA 50 MG capsule, tk 1 capsule at bedtime, Disp: , Rfl: 2 .  metFORMIN (GLUCOPHAGE) 1000 MG tablet, Take 1 tablet (1,000 mg total) by mouth 2 (two) times daily with a meal., Disp: 180 tablet, Rfl: 0 .  propranolol ER (INDERAL LA) 60 MG 24 hr capsule, Take 60 mg by mouth at bedtime. , Disp: , Rfl:  .  QUEtiapine (SEROQUEL XR) 400 MG 24 hr tablet, TK 1 T PO QD AT 8PM ONE HOUR BEFORE OR AFTER A MEAL, Disp: , Rfl: 2 .  quinapril-hydrochlorothiazide (ACCURETIC) 20-12.5 MG  tablet, TAKE 2 TABLETS BY MOUTH DAILY, Disp: 180 tablet, Rfl: 0 .  rOPINIRole (REQUIP) 1 MG tablet, Take 1 mg by mouth at bedtime., Disp: , Rfl:  .  simvastatin (ZOCOR) 40 MG tablet, TAKE 1 TABLET BY MOUTH AT BEDTIME, Disp: 90 tablet, Rfl: 0 .  pantoprazole (PROTONIX) 40 MG tablet, Take 1 tablet (40 mg total) by mouth daily. (Patient not taking: Reported on 03/10/2017), Disp: 30 tablet, Rfl: 0 .  Vitamin D, Ergocalciferol, (DRISDOL) 50000 units CAPS capsule, Take 1 capsule (50,000 Units total) by mouth once a week. For 12 weeks (Patient not taking: Reported on 03/10/2017), Disp: 12 capsule, Rfl: 0  Current Facility-Administered Medications:  .  0.9 %  sodium chloride infusion, 500 mL, Intravenous, Continuous, Armbruster, Renelda Loma, MD  Facility-Administered Medications Ordered in Other Visits:  .  0.9 %  sodium  chloride infusion, , Intravenous, Once, Kale, Cloria Spring, MD .  alteplase (CATHFLO ACTIVASE) injection 2 mg, 2 mg, Intracatheter, Once PRN, Brunetta Genera, MD .  heparin lock flush 100 unit/mL, 500 Units, Intracatheter, Once PRN, Irene Limbo, Cloria Spring, MD .  heparin lock flush 100 unit/mL, 250 Units, Intracatheter, Once PRN, Irene Limbo, Cloria Spring, MD .  sodium chloride 0.9 % injection 10 mL, 10 mL, Intracatheter, PRN, Brunetta Genera, MD .  sodium chloride 0.9 % injection 3 mL, 3 mL, Intravenous, Once PRN, Brunetta Genera, MD  No Known Allergies   ROS    Objective  Vitals:   03/10/17 1346  BP: 132/74  Pulse: 96  Resp: 16  Temp: 97.7 F (36.5 C)  TempSrc: Oral  SpO2: 95%  Weight: 229 lb 12.8 oz (104.2 kg)  Height: '5\' 11"'$  (1.803 m)    Physical Exam  Constitutional: He is oriented to person, place, and time and well-developed, well-nourished, and in no distress.  HENT:  Head: Normocephalic and atraumatic.  Cardiovascular: Normal rate, regular rhythm and normal heart sounds.   No murmur heard. Pulmonary/Chest: Effort normal and breath sounds normal.  Neurological: He is alert and oriented to person, place, and time.  Psychiatric: Mood, memory, affect and judgment normal.  Nursing note and vitals reviewed.      Recent Results (from the past 2160 hour(s))  POCT Glucose (CBG)     Status: Abnormal   Collection Time: 01/04/17  1:42 PM  Result Value Ref Range   POC Glucose 549 (A) 70 - 99 mg/dl  POCT HgB A1C     Status: Abnormal   Collection Time: 01/04/17  1:43 PM  Result Value Ref Range   Hemoglobin A1C 14.0   Comprehensive metabolic panel     Status: Abnormal   Collection Time: 01/20/17 11:32 PM  Result Value Ref Range   Sodium 133 (L) 135 - 145 mmol/L   Potassium 4.0 3.5 - 5.1 mmol/L   Chloride 95 (L) 101 - 111 mmol/L   CO2 26 22 - 32 mmol/L   Glucose, Bld 336 (H) 65 - 99 mg/dL   BUN 24 (H) 6 - 20 mg/dL   Creatinine, Ser 1.02 0.61 - 1.24 mg/dL    Calcium 9.5 8.9 - 10.3 mg/dL   Total Protein 8.1 6.5 - 8.1 g/dL   Albumin 4.8 3.5 - 5.0 g/dL   AST 24 15 - 41 U/L   ALT 23 17 - 63 U/L   Alkaline Phosphatase 63 38 - 126 U/L   Total Bilirubin 0.8 0.3 - 1.2 mg/dL   GFR calc non Af Amer >60 >60 mL/min   GFR  calc Af Amer >60 >60 mL/min    Comment: (NOTE) The eGFR has been calculated using the CKD EPI equation. This calculation has not been validated in all clinical situations. eGFR's persistently <60 mL/min signify possible Chronic Kidney Disease.    Anion gap 12 5 - 15  Ethanol     Status: None   Collection Time: 01/20/17 11:32 PM  Result Value Ref Range   Alcohol, Ethyl (B) <5 <5 mg/dL    Comment:        LOWEST DETECTABLE LIMIT FOR SERUM ALCOHOL IS 5 mg/dL FOR MEDICAL PURPOSES ONLY   Salicylate level     Status: None   Collection Time: 01/20/17 11:32 PM  Result Value Ref Range   Salicylate Lvl <5.2 2.8 - 30.0 mg/dL  Acetaminophen level     Status: Abnormal   Collection Time: 01/20/17 11:32 PM  Result Value Ref Range   Acetaminophen (Tylenol), Serum <10 (L) 10 - 30 ug/mL    Comment:        THERAPEUTIC CONCENTRATIONS VARY SIGNIFICANTLY. A RANGE OF 10-30 ug/mL MAY BE AN EFFECTIVE CONCENTRATION FOR MANY PATIENTS. HOWEVER, SOME ARE BEST TREATED AT CONCENTRATIONS OUTSIDE THIS RANGE. ACETAMINOPHEN CONCENTRATIONS >150 ug/mL AT 4 HOURS AFTER INGESTION AND >50 ug/mL AT 12 HOURS AFTER INGESTION ARE OFTEN ASSOCIATED WITH TOXIC REACTIONS.   cbc     Status: None   Collection Time: 01/20/17 11:32 PM  Result Value Ref Range   WBC 6.5 3.8 - 10.6 K/uL   RBC 4.77 4.40 - 5.90 MIL/uL   Hemoglobin 14.2 13.0 - 18.0 g/dL   HCT 41.2 40.0 - 52.0 %   MCV 86.5 80.0 - 100.0 fL   MCH 29.9 26.0 - 34.0 pg   MCHC 34.6 32.0 - 36.0 g/dL   RDW 13.5 11.5 - 14.5 %   Platelets 298 150 - 440 K/uL  Urine Drug Screen, Qualitative     Status: None   Collection Time: 01/20/17 11:33 PM  Result Value Ref Range   Tricyclic, Ur Screen NONE DETECTED  NONE DETECTED   Amphetamines, Ur Screen NONE DETECTED NONE DETECTED   MDMA (Ecstasy)Ur Screen NONE DETECTED NONE DETECTED   Cocaine Metabolite,Ur [place] NONE DETECTED NONE DETECTED   Opiate, Ur Screen NONE DETECTED NONE DETECTED   Phencyclidine (PCP) Ur S NONE DETECTED NONE DETECTED   Cannabinoid 50 Ng, Ur [place] NONE DETECTED NONE DETECTED   Barbiturates, Ur Screen NONE DETECTED NONE DETECTED   Benzodiazepine, Ur Scrn NONE DETECTED NONE DETECTED   Methadone Scn, Ur NONE DETECTED NONE DETECTED    Comment: (NOTE) 174  Tricyclics, urine               Cutoff 1000 ng/mL 200  Amphetamines, urine             Cutoff 1000 ng/mL 300  MDMA (Ecstasy), urine           Cutoff 500 ng/mL 400  Cocaine Metabolite, urine       Cutoff 300 ng/mL 500  Opiate, urine                   Cutoff 300 ng/mL 600  Phencyclidine (PCP), urine      Cutoff 25 ng/mL 700  Cannabinoid, urine              Cutoff 50 ng/mL 800  Barbiturates, urine             Cutoff 200 ng/mL 900  Benzodiazepine, urine  Cutoff 200 ng/mL 1000 Methadone, urine                Cutoff 300 ng/mL 1100 1200 The urine drug screen provides only a preliminary, unconfirmed 1300 analytical test result and should not be used for non-medical 1400 purposes. Clinical consideration and professional judgment should 1500 be applied to any positive drug screen result due to possible 1600 interfering substances. A more specific alternate chemical method 1700 must be used in order to obtain a confirmed analytical result.  1800 Gas chromato graphy / mass spectrometry (GC/MS) is the preferred 1900 confirmatory method.   Glucose, capillary     Status: Abnormal   Collection Time: 01/20/17 11:58 PM  Result Value Ref Range   Glucose-Capillary 365 (H) 65 - 99 mg/dL     Assessment & Plan  1. Living in assisted living Completed paperwork which included adult care home physician authorization and care plan.  Note: Total face-to-face time, 25 minutes, greater  than 50% was spent in counseling and correlation of care with the patient, this included reviewing all the paperwork, asking pertinent questions and recording answers on the forms provided.  Lyal Husted Asad A. [facility] 03/10/2017 6:18 PM

## 2017-03-18 ENCOUNTER — Telehealth: Payer: Self-pay | Admitting: Family Medicine

## 2017-03-18 NOTE — Telephone Encounter (Signed)
[place] from the [facility] requesting a prescription for Lyrica 50mg  and Armodafinil 150mg . Please send to [facility] [place] (F)[phone] (P) [phone]

## 2017-03-18 NOTE — Telephone Encounter (Signed)
Patient needs appointment because I have never prescribed armodafinil for him. Please schedule for next week

## 2017-03-18 NOTE — Telephone Encounter (Signed)
Dr. [name], [place] from the [facility] requesting a prescription for Lyrica 50mg  and Armodafinil 150mg . Please send to [place] ([phone] (P) [phone]

## 2017-03-19 ENCOUNTER — Encounter: Payer: Self-pay | Admitting: Emergency Medicine

## 2017-03-19 ENCOUNTER — Emergency Department
Admission: EM | Admit: 2017-03-19 | Discharge: 2017-03-19 | Disposition: A | Discharge status: Home / Self Care | Payer: PPO | Attending: Emergency Medicine | Admitting: Emergency Medicine

## 2017-03-19 DIAGNOSIS — I10 Essential (primary) hypertension: Secondary | ICD-10-CM | POA: Insufficient documentation

## 2017-03-19 DIAGNOSIS — Z79899 Other long term (current) drug therapy: Secondary | ICD-10-CM | POA: Diagnosis not present

## 2017-03-19 DIAGNOSIS — E119 Type 2 diabetes mellitus without complications: Secondary | ICD-10-CM | POA: Insufficient documentation

## 2017-03-19 DIAGNOSIS — Z7984 Long term (current) use of oral hypoglycemic drugs: Secondary | ICD-10-CM | POA: Diagnosis not present

## 2017-03-19 DIAGNOSIS — R197 Diarrhea, unspecified: Secondary | ICD-10-CM

## 2017-03-19 LAB — COMPREHENSIVE METABOLIC PANEL
ALT: 10 U/L — ABNORMAL LOW (ref 17–63)
AST: 22 U/L (ref 15–41)
Albumin: 4.4 g/dL (ref 3.5–5.0)
Alkaline Phosphatase: 127 U/L — ABNORMAL HIGH (ref 38–126)
Anion gap: 9 (ref 5–15)
BILIRUBIN TOTAL: 0.6 mg/dL (ref 0.3–1.2)
BUN: 23 mg/dL — ABNORMAL HIGH (ref 6–20)
CO2: 24 mmol/L (ref 22–32)
CREATININE: 1.34 mg/dL — AB (ref 0.61–1.24)
Calcium: 9 mg/dL (ref 8.9–10.3)
Chloride: 101 mmol/L (ref 101–111)
GFR calc non Af Amer: 58 mL/min — ABNORMAL LOW (ref >60)
GLUCOSE: 199 mg/dL — AB (ref 65–99)
Potassium: 4.2 mmol/L (ref 3.5–5.1)
SODIUM: 134 mmol/L — AB (ref 135–145)
Total Protein: 8.1 g/dL (ref 6.5–8.1)

## 2017-03-19 LAB — CBC
HCT: 48.8 % (ref 40.0–52.0)
Hemoglobin: 16.8 g/dL (ref 13.0–18.0)
MCH: 30.2 pg (ref 26.0–34.0)
MCHC: 34.5 g/dL (ref 32.0–36.0)
MCV: 87.4 fL (ref 80.0–100.0)
Platelets: 393 10*3/uL (ref 150–440)
RBC: 5.58 MIL/uL (ref 4.40–5.90)
RDW: 13.5 % (ref 11.5–14.5)
WBC: 14.3 10*3/uL — ABNORMAL HIGH (ref 3.8–10.6)

## 2017-03-19 LAB — URINALYSIS, COMPLETE (UACMP) WITH MICROSCOPIC
Bacteria, UA: NONE SEEN
Bilirubin Urine: NEGATIVE
Glucose, UA: NEGATIVE mg/dL
Hgb urine dipstick: NEGATIVE
Ketones, ur: 5 mg/dL — AB
Leukocytes, UA: NEGATIVE
Nitrite: NEGATIVE
PH: 5 (ref 5.0–8.0)
Protein, ur: NEGATIVE mg/dL
RBC / HPF: NONE SEEN RBC/hpf (ref 0–5)
SPECIFIC GRAVITY, URINE: 1.016 (ref 1.005–1.030)

## 2017-03-19 LAB — LIPASE, BLOOD: Lipase: 26 U/L (ref 11–51)

## 2017-03-19 MED ORDER — ONDANSETRON HCL 4 MG PO TABS: 4.0000 mg | ORAL_TABLET | Freq: Three times a day (TID) | ORAL | 0 refills | Status: DC | PRN

## 2017-03-19 MED ORDER — SODIUM CHLORIDE 0.9 % IV BOLUS (SEPSIS)
1000.0000 mL | Freq: Once | INTRAVENOUS | Status: AC
  Administered 2017-03-19: 1000 mL via INTRAVENOUS

## 2017-03-19 NOTE — Telephone Encounter (Signed)
Lyrica has been refilled and sent to [facility]

## 2017-03-19 NOTE — Discharge Instructions (Signed)
Please seek medical attention for any high fevers, chest pain, shortness of breath, change in behavior, persistent vomiting, bloody stool or any other new or concerning symptoms.  

## 2017-03-19 NOTE — ED Triage Notes (Signed)
Pt reports diarrhea for four days. Denies pain. Reports some vomiting but is more concerned about the diarrhea.

## 2017-03-19 NOTE — ED Provider Notes (Signed)
[facility] Emergency Department Provider Note   ____________________________________________   I have reviewed the triage vital signs and the nursing notes.   HISTORY  Chief Complaint Diarrhea   History limited by: Not Limited   HPI Bryan Flowers is a 56 y.o. male who presents to the emergency department today because of concerns for diarrhea. Has been going on for roughly the past 5 days. He has had 4-5 episodes of diarrhea per day. He has not nose any blood. This has been accompanied by somewhat generalized abdominal discomfort. He has had some nausea and is vomiting maybe 2 times. Denies any blood in his vomiting. He states he has been eating and drinking less than normal. He has felt dehydrated. He denies any shortness breath or chest pain. No fevers. No recent travel. No unusual ingestions. No known sick contacts.   Past Medical History:  Diagnosis Date  . Anemia    iron deficiency  . Bipolar 1 disorder, manic, moderate ([place])   . Depression   . Diabetes mellitus without complication ([place])   . GERD (gastroesophageal reflux disease)   . Hyperlipidemia   . Hypertension   . Insomnia   . Parkinson disease ([place])   . Peptic ulcer disease with hemorrhage     Patient Active Problem List   Diagnosis Date Noted  . Encounter for examination for driving license 24/23/5361  . Annual physical exam 10/01/2016  . Bilateral leg pain 08/20/2016  . Musculoskeletal leg pain, left 07/21/2016  . Fatigue 05/18/2016  . Sore throat 04/16/2016  . Iron deficiency anemia 03/30/2016  . Normocytic anemia due to blood loss 03/27/2016  . Anemia associated with acute blood loss 03/24/2016  . Multiple gastric ulcers   . Duodenal ulcer   . Anemia 03/15/2016  . GI bleed 03/15/2016  . Hyponatremia 03/15/2016  . Acute blood loss anemia 03/15/2016  . Parkinson disease ([place])   . Hypertension   . Subacute maxillary sinusitis 02/27/2016  . Anxiety 08/19/2015  . Type 2  diabetes mellitus ([place]) 08/19/2015  . Abnormal ECG 04/09/2015  . Back ache 04/09/2015  . Clinical depression 04/09/2015  . Uncontrolled type 2 diabetes mellitus ([place]) 04/09/2015  . Obstructive sleep apnea 04/09/2015  . HDL deficiency 04/09/2015  . Hearing loss, sensorineural, combined types 04/09/2015  . HLD (hyperlipidemia) 04/09/2015  . BP (high blood pressure) 04/09/2015  . Idiopathic Parkinson's disease ([place]) 04/09/2015  . Fibrositis 03/15/2014  . Fibromyalgia 03/15/2014  . Bipolar I disorder, single manic episode, moderate ([place]) 03/17/2010  . Testicular hypofunction 05/17/2009  . Migraine with aura 05/14/2009  . Anxiety state 03/19/2008  . Cannot sleep 03/05/2008  . Reflux esophagitis 07/12/2007    Past Surgical History:  Procedure Laterality Date  . ESOPHAGOGASTRODUODENOSCOPY Left 03/16/2016   Procedure: ESOPHAGOGASTRODUODENOSCOPY (EGD);  Surgeon: Manus Gunning, MD;  Location: [place];  Service: Gastroenterology;  Laterality: Left;  . FOOT FRACTURE SURGERY Right     Prior to Admission medications   Medication Sig Start Date End Date Taking? Authorizing Provider  acetaminophen (TYLENOL) 500 MG tablet Take 1,000 mg by mouth every 6 (six) hours as needed (pain).    [provider]  Armodafinil 150 MG tablet Take 150 mg by mouth daily. Nuvigil    [provider]  b complex vitamins capsule Take 1 capsule by mouth daily. 03/30/16   Brunetta Genera, MD  carbidopa-levodopa (SINEMET CR) 50-200 MG per tablet Take 1 tablet by mouth 3 (three) times daily.  03/08/15   [provider]  DULoxetine (CYMBALTA) 60 MG capsule Take 120 mg by mouth at bedtime.  03/15/15   [provider]  glipiZIDE (GLUCOTROL) 5 MG tablet Take 1 tablet (5 mg total) by mouth daily before breakfast. 11/02/16   Roselee Nova, MD  Insulin Degludec-Liraglutide (XULTOPHY) 100-3.6 UNIT-MG/ML SOPN Inject 16 Units into the skin at bedtime. 10/01/16   Roselee Nova, MD  lamoTRIgine (LAMICTAL) 200 MG tablet Take 200 mg by mouth at bedtime.  02/24/15   [provider]  LORazepam (ATIVAN) 0.5 MG tablet Take 0.5 mg by mouth 3 (three) times daily as needed for anxiety.  03/13/15   [provider]  LYRICA 50 MG capsule tk 1 capsule at bedtime 09/10/16   [provider]  metFORMIN (GLUCOPHAGE) 1000 MG tablet Take 1 tablet (1,000 mg total) by mouth 2 (two) times daily with a meal. 11/02/16   Roselee Nova, MD  pantoprazole (PROTONIX) 40 MG tablet Take 1 tablet (40 mg total) by mouth daily. Patient not taking: Reported on 03/10/2017 06/01/16   Armbruster, Renelda Loma, MD  propranolol ER (INDERAL LA) 60 MG 24 hr capsule Take 60 mg by mouth at bedtime.  02/24/15   [provider]  QUEtiapine (SEROQUEL XR) 400 MG 24 hr tablet TK 1 T PO QD AT 8PM ONE HOUR BEFORE OR AFTER A MEAL 09/10/16   [provider]  quinapril-hydrochlorothiazide (ACCURETIC) 20-12.5 MG tablet TAKE 2 TABLETS BY MOUTH DAILY 09/01/16   Roselee Nova, MD  rOPINIRole (REQUIP) 1 MG tablet Take 1 mg by mouth at bedtime.    [provider]  simvastatin (ZOCOR) 40 MG tablet TAKE 1 TABLET BY MOUTH AT BEDTIME 04/24/16   Roselee Nova, MD  Vitamin D, Ergocalciferol, (DRISDOL) 50000 units CAPS capsule Take 1 capsule (50,000 Units total) by mouth once a week. For 12 weeks Patient not taking: Reported on 03/10/2017 08/24/16   Roselee Nova, MD    Allergies Patient has no known allergies.  Family History  Problem Relation Age of Onset  . Cancer Mother   . Heart disease Father     Social History Social History  Substance Use Topics  . Smoking status: Never Smoker  . Smokeless tobacco: Never Used  . Alcohol use No    Review of Systems Constitutional: No fever/chills Eyes: No visual changes. ENT: No sore throat. Cardiovascular: Denies chest pain. Respiratory: Denies shortness of breath. Gastrointestinal: Positive for diarrhea, positive for  stomach discomfort.  Genitourinary: Negative for dysuria. Musculoskeletal: Negative for back pain. Skin: Negative for rash. Neurological: Negative for headaches, focal weakness or numbness.  ____________________________________________   PHYSICAL EXAM:  VITAL SIGNS: ED Triage Vitals  Enc Vitals Group     BP 03/19/17 1824 112/79     Pulse Rate 03/19/17 1824 (!) 107     Resp 03/19/17 1824 18     Temp 03/19/17 1824 97.5 F (36.4 C)     Temp Source 03/19/17 1824 Axillary     SpO2 03/19/17 1824 99 %     Weight 03/19/17 1825 230 lb (104.3 kg)     Height 03/19/17 1825 5\' 11"  (1.803 m)     Head Circumference --      Peak Flow --      Pain Score 03/19/17 1824 0    Constitutional: Alert and oriented. Well appearing and in no distress. Eyes: Conjunctivae are normal.  ENT   Head: Normocephalic and atraumatic.   Nose:  No congestion/rhinnorhea.   Mouth/Throat: Mucous membranes are moist.   Neck: No stridor. Hematological/Lymphatic/Immunilogical: No cervical lymphadenopathy. Cardiovascular: Normal rate, regular rhythm.  No murmurs, rubs, or gallops. Respiratory: Normal respiratory effort without tachypnea nor retractions. Breath sounds are clear and equal bilaterally. No wheezes/rales/rhonchi. Gastrointestinal: Soft and non tender. No rebound. No guarding.  Genitourinary: Deferred Musculoskeletal: Normal range of motion in all extremities. No lower extremity edema. Neurologic:  Normal speech and language. No gross focal neurologic deficits are appreciated.  Skin:  Skin is warm, dry and intact. No rash noted. Psychiatric: Mood and affect are normal. Speech and behavior are normal. Patient exhibits appropriate insight and judgment.  ____________________________________________    LABS (pertinent positives/negatives)  Labs Reviewed  COMPREHENSIVE METABOLIC PANEL - Abnormal; Notable for the following:       Result Value   Sodium 134 (*)    Glucose, Bld 199 (*)    BUN  23 (*)    Creatinine, Ser 1.34 (*)    ALT 10 (*)    Alkaline Phosphatase 127 (*)    GFR calc non Af Amer 58 (*)    All other components within normal limits  CBC - Abnormal; Notable for the following:    WBC 14.3 (*)    All other components within normal limits  URINALYSIS, COMPLETE (UACMP) WITH MICROSCOPIC - Abnormal; Notable for the following:    Color, Urine YELLOW (*)    APPearance CLEAR (*)    Ketones, ur 5 (*)    Squamous Epithelial / LPF 0-5 (*)    All other components within normal limits  LIPASE, BLOOD     ____________________________________________   EKG  None  ____________________________________________    RADIOLOGY  None  ____________________________________________   PROCEDURES  Procedures  ____________________________________________   INITIAL IMPRESSION / ASSESSMENT AND PLAN / ED COURSE  Pertinent labs & imaging results that were available during my care of the patient were reviewed by me and considered in my medical decision making (see chart for details).  Patient presented to the emergency department today because of concerns for diarrhea. Blood work here shows he is likely slightly dehydrated. He was given IV fluids. This point abdomen is benign. Do not feel that any emergent imaging is required at this time. Will plan on discharging home with medication for nausea. Discussed ports of following up with primary care doctor.  ____________________________________________   FINAL CLINICAL IMPRESSION(S) / ED DIAGNOSES  Final diagnoses:  Diarrhea, unspecified type     Note: This dictation was prepared with Dragon dictation. Any transcriptional errors that result from this process are unintentional     Nance Pear, MD 03/19/17 2302

## 2017-03-23 ENCOUNTER — Ambulatory Visit (INDEPENDENT_AMBULATORY_CARE_PROVIDER_SITE_OTHER): Payer: PPO | Admitting: Family Medicine

## 2017-03-23 ENCOUNTER — Encounter: Payer: Self-pay | Admitting: Family Medicine

## 2017-03-23 VITALS — BP 118/73 | HR 88 | Temp 97.8°F | Resp 16 | Ht 71.0 in | Wt 219.2 lb

## 2017-03-23 DIAGNOSIS — E860 Dehydration: Secondary | ICD-10-CM | POA: Diagnosis not present

## 2017-03-23 DIAGNOSIS — R197 Diarrhea, unspecified: Secondary | ICD-10-CM | POA: Diagnosis not present

## 2017-03-23 LAB — COMPLETE METABOLIC PANEL WITH GFR
ALBUMIN: 4.3 g/dL (ref 3.6–5.1)
ALT: 34 U/L (ref 9–46)
AST: 21 U/L (ref 10–35)
Alkaline Phosphatase: 106 U/L (ref 40–115)
BILIRUBIN TOTAL: 0.3 mg/dL (ref 0.2–1.2)
BUN: 14 mg/dL (ref 7–25)
CO2: 26 mmol/L (ref 20–31)
Calcium: 9.4 mg/dL (ref 8.6–10.3)
Chloride: 101 mmol/L (ref 98–110)
Creat: 0.92 mg/dL (ref 0.70–1.33)
GFR, Est African American: >89 mL/min (ref >=60)
GLUCOSE: 198 mg/dL — AB (ref 65–99)
Potassium: 4.2 mmol/L (ref 3.5–5.3)
SODIUM: 139 mmol/L (ref 135–146)
TOTAL PROTEIN: 6.7 g/dL (ref 6.1–8.1)

## 2017-03-23 NOTE — Progress Notes (Signed)
Name: Bryan Flowers   MRN: [id number]    DOB: 18-Jun-1961   Date:03/23/2017       Progress Note  Subjective  Chief Complaint  Chief Complaint  Patient presents with  . Diarrhea    x1 week    Diarrhea   This is a new problem. The current episode started in the past 7 days. The problem occurs 2 to 4 times per day (now resolved). The problem has been resolved. The stool consistency is described as watery. The patient states that diarrhea does not awaken him from sleep. Associated symptoms include bloating, chills and vomiting (2 episodes of vomiting in last week). Pertinent negatives include no abdominal pain or fever. There are no known risk factors. He has tried anti-motility drug for the symptoms. The treatment provided no relief.     Past Medical History:  Diagnosis Date  . Anemia    iron deficiency  . Bipolar 1 disorder, manic, moderate ([place])   . Depression   . Diabetes mellitus without complication ([place])   . GERD (gastroesophageal reflux disease)   . Hyperlipidemia   . Hypertension   . Insomnia   . Parkinson disease ([place])   . Peptic ulcer disease with hemorrhage     Past Surgical History:  Procedure Laterality Date  . ESOPHAGOGASTRODUODENOSCOPY Left 03/16/2016   Procedure: ESOPHAGOGASTRODUODENOSCOPY (EGD);  Surgeon: Manus Gunning, MD;  Location: [place];  Service: Gastroenterology;  Laterality: Left;  . FOOT FRACTURE SURGERY Right     Family History  Problem Relation Age of Onset  . Cancer Mother   . Heart disease Father     Social History   Social History  . Marital status: Married    Spouse name: N/A  . Number of children: N/A  . Years of education: N/A   Occupational History  . Not on file.   Social History Main Topics  . Smoking status: Never Smoker  . Smokeless tobacco: Never Used  . Alcohol use No  . Drug use: No  . Sexual activity: Yes    Partners: Female   Other Topics Concern  . Not on file   Social History Narrative  . No  narrative on file     Current Outpatient Prescriptions:  .  acetaminophen (TYLENOL) 500 MG tablet, Take 1,000 mg by mouth every 6 (six) hours as needed (pain)., Disp: , Rfl:  .  Armodafinil 150 MG tablet, Take 150 mg by mouth daily. Nuvigil, Disp: , Rfl:  .  b complex vitamins capsule, Take 1 capsule by mouth daily., Disp: , Rfl:  .  carbidopa-levodopa (SINEMET CR) 50-200 MG per tablet, Take 1 tablet by mouth 3 (three) times daily. , Disp: , Rfl:  .  DULoxetine (CYMBALTA) 60 MG capsule, Take 120 mg by mouth at bedtime. , Disp: , Rfl:  .  glipiZIDE (GLUCOTROL) 5 MG tablet, Take 1 tablet (5 mg total) by mouth daily before breakfast., Disp: 90 tablet, Rfl: 0 .  Insulin Degludec-Liraglutide (XULTOPHY) 100-3.6 UNIT-MG/ML SOPN, Inject 16 Units into the skin at bedtime., Disp: 15 mL, Rfl: 2 .  lamoTRIgine (LAMICTAL) 200 MG tablet, Take 200 mg by mouth at bedtime. , Disp: , Rfl:  .  LORazepam (ATIVAN) 0.5 MG tablet, Take 0.5 mg by mouth 3 (three) times daily as needed for anxiety. , Disp: , Rfl:  .  LYRICA 50 MG capsule, tk 1 capsule at bedtime, Disp: , Rfl: 2 .  metFORMIN (GLUCOPHAGE) 1000 MG tablet, Take 1 tablet (1,000 mg total) by  mouth 2 (two) times daily with a meal., Disp: 180 tablet, Rfl: 0 .  ondansetron (ZOFRAN) 4 MG tablet, Take 1 tablet (4 mg total) by mouth every 8 (eight) hours as needed for nausea or vomiting., Disp: 20 tablet, Rfl: 0 .  pantoprazole (PROTONIX) 40 MG tablet, Take 1 tablet (40 mg total) by mouth daily., Disp: 30 tablet, Rfl: 0 .  propranolol ER (INDERAL LA) 60 MG 24 hr capsule, Take 60 mg by mouth at bedtime. , Disp: , Rfl:  .  QUEtiapine (SEROQUEL XR) 400 MG 24 hr tablet, TK 1 T PO QD AT 8PM ONE HOUR BEFORE OR AFTER A MEAL, Disp: , Rfl: 2 .  quinapril-hydrochlorothiazide (ACCURETIC) 20-12.5 MG tablet, TAKE 2 TABLETS BY MOUTH DAILY, Disp: 180 tablet, Rfl: 0 .  rOPINIRole (REQUIP) 1 MG tablet, Take 1 mg by mouth at bedtime., Disp: , Rfl:  .  simvastatin (ZOCOR) 40 MG  tablet, TAKE 1 TABLET BY MOUTH AT BEDTIME, Disp: 90 tablet, Rfl: 0 .  Vitamin D, Ergocalciferol, (DRISDOL) 50000 units CAPS capsule, Take 1 capsule (50,000 Units total) by mouth once a week. For 12 weeks, Disp: 12 capsule, Rfl: 0  Current Facility-Administered Medications:  .  0.9 %  sodium chloride infusion, 500 mL, Intravenous, Continuous, Armbruster, Renelda Loma, MD  Facility-Administered Medications Ordered in Other Visits:  .  0.9 %  sodium chloride infusion, , Intravenous, Once, Kale, Cloria Spring, MD .  alteplase (CATHFLO ACTIVASE) injection 2 mg, 2 mg, Intracatheter, Once PRN, Brunetta Genera, MD .  heparin lock flush 100 unit/mL, 500 Units, Intracatheter, Once PRN, Irene Limbo, Cloria Spring, MD .  heparin lock flush 100 unit/mL, 250 Units, Intracatheter, Once PRN, Brunetta Genera, MD .  sodium chloride 0.9 % injection 10 mL, 10 mL, Intracatheter, PRN, Brunetta Genera, MD .  sodium chloride 0.9 % injection 3 mL, 3 mL, Intravenous, Once PRN, Brunetta Genera, MD  No Known Allergies   Review of Systems  Constitutional: Positive for chills. Negative for fever.  Gastrointestinal: Positive for bloating, diarrhea and vomiting (2 episodes of vomiting in last week). Negative for abdominal pain.      Objective  Vitals:   03/23/17 0902  BP: 118/73  Pulse: 88  Resp: 16  Temp: 97.8 F (36.6 C)  TempSrc: Oral  SpO2: 96%  Weight: 219 lb 3.2 oz (99.4 kg)  Height: 5\' 11"  (1.803 m)    Physical Exam  Constitutional: He is oriented to person, place, and time and well-developed, well-nourished, and in no distress.  HENT:  Head: Normocephalic and atraumatic.  Cardiovascular: Normal rate, regular rhythm and normal heart sounds.   No murmur heard. Pulmonary/Chest: Effort normal and breath sounds normal. He has no wheezes.  Abdominal: Soft. Bowel sounds are normal. There is no tenderness. There is no guarding.  Neurological: He is alert and oriented to person, place,  and time.  Nursing note and vitals reviewed.       Assessment & Plan  1. Diarrhea of presumed infectious origin Now resolved, reassurance and advised to increase fluid intake  2. Mild dehydration As above, likely because of diarrhea, obtain CMP - COMPLETE METABOLIC PANEL WITH GFR   Salil Raineri [facility] 03/23/2017 9:16 AM

## 2017-03-30 ENCOUNTER — Emergency Department: Payer: PPO

## 2017-03-30 ENCOUNTER — Emergency Department
Admission: EM | Admit: 2017-03-30 | Discharge: 2017-03-30 | Disposition: A | Discharge status: Home / Self Care | Payer: PPO | Attending: Emergency Medicine | Admitting: Emergency Medicine

## 2017-03-30 ENCOUNTER — Encounter: Payer: Self-pay | Admitting: Emergency Medicine

## 2017-03-30 DIAGNOSIS — R197 Diarrhea, unspecified: Secondary | ICD-10-CM

## 2017-03-30 DIAGNOSIS — Z79899 Other long term (current) drug therapy: Secondary | ICD-10-CM | POA: Diagnosis not present

## 2017-03-30 DIAGNOSIS — R1110 Vomiting, unspecified: Secondary | ICD-10-CM | POA: Diagnosis not present

## 2017-03-30 DIAGNOSIS — R1011 Right upper quadrant pain: Secondary | ICD-10-CM

## 2017-03-30 DIAGNOSIS — E119 Type 2 diabetes mellitus without complications: Secondary | ICD-10-CM | POA: Insufficient documentation

## 2017-03-30 DIAGNOSIS — K8020 Calculus of gallbladder without cholecystitis without obstruction: Secondary | ICD-10-CM | POA: Diagnosis not present

## 2017-03-30 DIAGNOSIS — Z7984 Long term (current) use of oral hypoglycemic drugs: Secondary | ICD-10-CM | POA: Insufficient documentation

## 2017-03-30 DIAGNOSIS — G20 Parkinson's disease: Secondary | ICD-10-CM | POA: Insufficient documentation

## 2017-03-30 DIAGNOSIS — I10 Essential (primary) hypertension: Secondary | ICD-10-CM | POA: Diagnosis not present

## 2017-03-30 LAB — COMPREHENSIVE METABOLIC PANEL
ALK PHOS: 80 U/L (ref 38–126)
ALT: 7 U/L — AB (ref 17–63)
AST: 19 U/L (ref 15–41)
Albumin: 3.9 g/dL (ref 3.5–5.0)
Anion gap: 7 (ref 5–15)
BILIRUBIN TOTAL: 0.5 mg/dL (ref 0.3–1.2)
BUN: 14 mg/dL (ref 6–20)
CALCIUM: 8.8 mg/dL — AB (ref 8.9–10.3)
CHLORIDE: 103 mmol/L (ref 101–111)
CO2: 28 mmol/L (ref 22–32)
CREATININE: 1.03 mg/dL (ref 0.61–1.24)
Glucose, Bld: 184 mg/dL — ABNORMAL HIGH (ref 65–99)
Potassium: 3.8 mmol/L (ref 3.5–5.1)
Sodium: 138 mmol/L (ref 135–145)
TOTAL PROTEIN: 6.9 g/dL (ref 6.5–8.1)

## 2017-03-30 LAB — CBC
HCT: 38.9 % — ABNORMAL LOW (ref 40.0–52.0)
Hemoglobin: 13.4 g/dL (ref 13.0–18.0)
MCH: 30 pg (ref 26.0–34.0)
MCHC: 34.5 g/dL (ref 32.0–36.0)
MCV: 87 fL (ref 80.0–100.0)
Platelets: 246 10*3/uL (ref 150–440)
RBC: 4.47 MIL/uL (ref 4.40–5.90)
RDW: 13.7 % (ref 11.5–14.5)
WBC: 12.8 10*3/uL — AB (ref 3.8–10.6)

## 2017-03-30 LAB — LIPASE, BLOOD: LIPASE: 38 U/L (ref 11–51)

## 2017-03-30 MED ORDER — FAMOTIDINE 40 MG PO TABS: 40.0000 mg | ORAL_TABLET | Freq: Every evening | ORAL | 1 refills | Status: DC

## 2017-03-30 MED ORDER — SUCRALFATE 1 G PO TABS: 1.0000 g | ORAL_TABLET | Freq: Four times a day (QID) | ORAL | 0 refills | Status: DC

## 2017-03-30 NOTE — ED Notes (Signed)
Pt discharged to home.  Family member driving.  Discharge instructions reviewed.  Verbalized understanding.  No questions or concerns at this time.  Teach back verified.  Pt in NAD.  No items left in ED.   

## 2017-03-30 NOTE — ED Triage Notes (Signed)
Pt c/o NVD. Has had for 2 weeks. Seen here recently for same but not better. Did have one episode right abdomen pain yesterday but none today.  ambulatory to triage without difficulty. skin warm and dry

## 2017-03-30 NOTE — ED Provider Notes (Signed)
[facility] Emergency Department Provider Note  ____________________________________________   I have reviewed the triage vital signs and the nursing notes.   HISTORY  Chief Complaint Emesis and Diarrhea   History limited by: Not Limited   HPI Bryan Flowers is a 56 y.o. male who presents to the emergency department today because of concerns for continued nausea, vomiting and diarrhea.  This has been going on for almost 2 weeks.  I did evaluate the patient 11 days ago for similar symptoms.  Patient states that he came in today because he's concerned that he might be coming dehydrated.  The notch medication that I prescribed does help with the nausea.  However, he continues to have watery diarrhea.  In addition, the patient states he is now experiencing right upper quadrant pain.  This is intermittent.  It is sharp.  It is worse after eating.  He denies any fevers.  He does have a history of stomach and duodenal ulcers.    Past Medical History:  Diagnosis Date  . Anemia    iron deficiency  . Bipolar 1 disorder, manic, moderate ([place])   . Depression   . Diabetes mellitus without complication ([place])   . GERD (gastroesophageal reflux disease)   . Hyperlipidemia   . Hypertension   . Insomnia   . Parkinson disease ([place])   . Peptic ulcer disease with hemorrhage     Patient Active Problem List   Diagnosis Date Noted  . Encounter for examination for driving license 72/06/[id number]  . Annual physical exam 10/01/2016  . Bilateral leg pain 08/20/2016  . Musculoskeletal leg pain, left 07/21/2016  . Fatigue 05/18/2016  . Sore throat 04/16/2016  . Iron deficiency anemia 03/30/2016  . Normocytic anemia due to blood loss 03/27/2016  . Anemia associated with acute blood loss 03/24/2016  . Multiple gastric ulcers   . Duodenal ulcer   . Anemia 03/15/2016  . GI bleed 03/15/2016  . Hyponatremia 03/15/2016  . Acute blood loss anemia 03/15/2016  . Parkinson disease  ([place])   . Hypertension   . Subacute maxillary sinusitis 02/27/2016  . Anxiety 08/19/2015  . Type 2 diabetes mellitus ([place]) 08/19/2015  . Abnormal ECG 04/09/2015  . Back ache 04/09/2015  . Clinical depression 04/09/2015  . Uncontrolled type 2 diabetes mellitus ([place]) 04/09/2015  . Obstructive sleep apnea 04/09/2015  . HDL deficiency 04/09/2015  . Hearing loss, sensorineural, combined types 04/09/2015  . HLD (hyperlipidemia) 04/09/2015  . BP (high blood pressure) 04/09/2015  . Idiopathic Parkinson's disease ([place]) 04/09/2015  . Fibrositis 03/15/2014  . Fibromyalgia 03/15/2014  . Bipolar I disorder, single manic episode, moderate ([place]) 03/17/2010  . Testicular hypofunction 05/17/2009  . Migraine with aura 05/14/2009  . Anxiety state 03/19/2008  . Cannot sleep 03/05/2008  . Reflux esophagitis 07/12/2007    Past Surgical History:  Procedure Laterality Date  . ESOPHAGOGASTRODUODENOSCOPY Left 03/16/2016   Procedure: ESOPHAGOGASTRODUODENOSCOPY (EGD);  Surgeon: Manus Gunning, MD;  Location: [place];  Service: Gastroenterology;  Laterality: Left;  . FOOT FRACTURE SURGERY Right     Prior to Admission medications   Medication Sig Start Date End Date Taking? Authorizing Provider  acetaminophen (TYLENOL) 500 MG tablet Take 1,000 mg by mouth every 6 (six) hours as needed (pain).    [provider]  Armodafinil 150 MG tablet Take 150 mg by mouth daily. Nuvigil    [provider]  b complex vitamins capsule Take 1 capsule by mouth daily. 03/30/16   Brunetta Genera,  MD  carbidopa-levodopa (SINEMET CR) 50-200 MG per tablet Take 1 tablet by mouth 3 (three) times daily.  03/08/15   [provider]  DULoxetine (CYMBALTA) 60 MG capsule Take 120 mg by mouth at bedtime.  03/15/15   [provider]  glipiZIDE (GLUCOTROL) 5 MG tablet Take 1 tablet (5 mg total) by mouth daily before breakfast. 11/02/16   Roselee Nova, MD  Insulin Degludec-Liraglutide  (XULTOPHY) 100-3.6 UNIT-MG/ML SOPN Inject 16 Units into the skin at bedtime. 10/01/16   Roselee Nova, MD  lamoTRIgine (LAMICTAL) 200 MG tablet Take 200 mg by mouth at bedtime.  02/24/15   [provider]  LORazepam (ATIVAN) 0.5 MG tablet Take 0.5 mg by mouth 3 (three) times daily as needed for anxiety.  03/13/15   [provider]  LYRICA 50 MG capsule tk 1 capsule at bedtime 09/10/16   [provider]  metFORMIN (GLUCOPHAGE) 1000 MG tablet Take 1 tablet (1,000 mg total) by mouth 2 (two) times daily with a meal. 11/02/16   Roselee Nova, MD  ondansetron (ZOFRAN) 4 MG tablet Take 1 tablet (4 mg total) by mouth every 8 (eight) hours as needed for nausea or vomiting. 03/19/17   Nance Pear, MD  pantoprazole (PROTONIX) 40 MG tablet Take 1 tablet (40 mg total) by mouth daily. 06/01/16   Armbruster, Renelda Loma, MD  propranolol ER (INDERAL LA) 60 MG 24 hr capsule Take 60 mg by mouth at bedtime.  02/24/15   [provider]  QUEtiapine (SEROQUEL XR) 400 MG 24 hr tablet TK 1 T PO QD AT 8PM ONE HOUR BEFORE OR AFTER A MEAL 09/10/16   [provider]  quinapril-hydrochlorothiazide (ACCURETIC) 20-12.5 MG tablet TAKE 2 TABLETS BY MOUTH DAILY 09/01/16   Roselee Nova, MD  rOPINIRole (REQUIP) 1 MG tablet Take 1 mg by mouth at bedtime.    [provider]  simvastatin (ZOCOR) 40 MG tablet TAKE 1 TABLET BY MOUTH AT BEDTIME 04/24/16   Roselee Nova, MD  Vitamin D, Ergocalciferol, (DRISDOL) 50000 units CAPS capsule Take 1 capsule (50,000 Units total) by mouth once a week. For 12 weeks 08/24/16   Roselee Nova, MD    Allergies Patient has no known allergies.  Family History  Problem Relation Age of Onset  . Cancer Mother   . Heart disease Father     Social History Social History  Substance Use Topics  . Smoking status: Never Smoker  . Smokeless tobacco: Never Used  . Alcohol use No    Review of Systems Constitutional: No  fever/chills Eyes: No visual changes. ENT: No sore throat. Cardiovascular: Denies chest pain. Respiratory: Denies shortness of breath. Gastrointestinal: Positive for abdominal pain, nausea, vomiting and diarrhea.  Genitourinary: Negative for dysuria. Musculoskeletal: Negative for back pain. Skin: Negative for rash. Neurological: Negative for headaches, focal weakness or numbness.  ____________________________________________   PHYSICAL EXAM:  VITAL SIGNS: ED Triage Vitals [03/30/17 1417]  Enc Vitals Group     BP 119/80     Pulse Rate (!) 108     Resp 18     Temp 98.5 F (36.9 C)     Temp Source Oral     SpO2 97 %     Weight 219 lb (99.3 kg)     Height 5\' 11"  (1.803 m)    Constitutional: Alert and oriented. Well appearing and in no distress. Eyes: Conjunctivae are normal.  ENT   Head: Normocephalic and atraumatic.  Nose: No congestion/rhinnorhea.   Mouth/Throat: Mucous membranes are moist.   Neck: No stridor. Hematological/Lymphatic/Immunilogical: No cervical lymphadenopathy. Cardiovascular: Normal rate, regular rhythm.  Systolic II/VI murmur.  Respiratory: Normal respiratory effort without tachypnea nor retractions. Breath sounds are clear and equal bilaterally. No wheezes/rales/rhonchi. Gastrointestinal: Soft and non tender. No rebound. No guarding.  Genitourinary: Deferred Musculoskeletal: Normal range of motion in all extremities. No lower extremity edema. Neurologic:  Normal speech and language. No gross focal neurologic deficits are appreciated.  Skin:  Skin is warm, dry and intact. No rash noted. Psychiatric: Mood and affect are normal. Speech and behavior are normal. Patient exhibits appropriate insight and judgment.  ____________________________________________    LABS (pertinent positives/negatives)  Labs Reviewed  COMPREHENSIVE METABOLIC PANEL - Abnormal; Notable for the following:       Result Value   Glucose, Bld 184 (*)    Calcium 8.8  (*)    ALT 7 (*)    All other components within normal limits  CBC - Abnormal; Notable for the following:    WBC 12.8 (*)    HCT 38.9 (*)    All other components within normal limits  LIPASE, BLOOD  URINALYSIS, COMPLETE (UACMP) WITH MICROSCOPIC     ____________________________________________   EKG  None  ____________________________________________    RADIOLOGY  RUQ US IMPRESSION: 1. No gallstones. No ductal dilatation. 2. Somewhat echogenic hepatic parenchyma may indicate fatty infiltration. Correlate with LFTs.  ____________________________________________   PROCEDURES  Procedures  ____________________________________________   INITIAL IMPRESSION / ASSESSMENT AND PLAN / ED COURSE  Pertinent labs & imaging results that were available during my care of the patient were reviewed by me and considered in my medical decision making (see chart for details).  Patient presents to the emergency department today with continued nausea vomiting and diarrhea with new right upper quadrant pain. Medical on ultrasound did not gallstones. Patient does have a history of ulcers. This point I think pain is likely related to ulcers. Will plan on antacids and sucralfate. While patient follow-up with primary care. ____________________________________________   FINAL CLINICAL IMPRESSION(S) / ED DIAGNOSES  Final diagnoses:  RUQ pain  Diarrhea, unspecified type     Note: This dictation was prepared with Dragon dictation. Any transcriptional errors that result from this process are unintentional     Nance Pear, MD 03/30/17 1757

## 2017-03-30 NOTE — Discharge Instructions (Signed)
Please seek medical attention for any high fevers, chest pain, shortness of breath, change in behavior, persistent vomiting, bloody stool or any other new or concerning symptoms.  

## 2017-04-05 ENCOUNTER — Ambulatory Visit (INDEPENDENT_AMBULATORY_CARE_PROVIDER_SITE_OTHER): Payer: PPO | Admitting: Family Medicine

## 2017-04-05 ENCOUNTER — Encounter: Payer: Self-pay | Admitting: Family Medicine

## 2017-04-05 VITALS — BP 128/76 | HR 103 | Temp 98.3°F | Resp 16 | Ht 71.0 in | Wt 228.9 lb

## 2017-04-05 DIAGNOSIS — I10 Essential (primary) hypertension: Secondary | ICD-10-CM | POA: Diagnosis not present

## 2017-04-05 DIAGNOSIS — E1165 Type 2 diabetes mellitus with hyperglycemia: Secondary | ICD-10-CM | POA: Diagnosis not present

## 2017-04-05 DIAGNOSIS — Z794 Long term (current) use of insulin: Secondary | ICD-10-CM | POA: Diagnosis not present

## 2017-04-05 DIAGNOSIS — IMO0001 Reserved for inherently not codable concepts without codable children: Secondary | ICD-10-CM

## 2017-04-05 DIAGNOSIS — E7800 Pure hypercholesterolemia, unspecified: Secondary | ICD-10-CM

## 2017-04-05 LAB — POCT GLYCOSYLATED HEMOGLOBIN (HGB A1C): HEMOGLOBIN A1C: 9.1

## 2017-04-05 MED ORDER — INSULIN DEGLUDEC-LIRAGLUTIDE 100-3.6 UNIT-MG/ML ~~LOC~~ SOPN: 16.0000 [IU] | PEN_INJECTOR | Freq: Every day | SUBCUTANEOUS | 2 refills | Status: DC

## 2017-04-05 MED ORDER — QUINAPRIL-HYDROCHLOROTHIAZIDE 20-12.5 MG PO TABS: 2.0000 | ORAL_TABLET | Freq: Every day | ORAL | 0 refills | Status: DC

## 2017-04-05 MED ORDER — SIMVASTATIN 40 MG PO TABS: 40.0000 mg | ORAL_TABLET | Freq: Every day | ORAL | 0 refills | Status: DC

## 2017-04-05 MED ORDER — METFORMIN HCL 1000 MG PO TABS: 1000.0000 mg | ORAL_TABLET | Freq: Two times a day (BID) | ORAL | 0 refills | Status: DC

## 2017-04-05 MED ORDER — GLIPIZIDE 5 MG PO TABS: 5.0000 mg | ORAL_TABLET | Freq: Every day | ORAL | 0 refills | Status: DC

## 2017-04-05 NOTE — Progress Notes (Signed)
Name: Bryan Flowers   MRN: [id number]    DOB: [birth year]   Date:04/05/2017       Progress Note  Subjective  Chief Complaint  Chief Complaint  Patient presents with  . Diabetes    3 month F/U  . Medication Refill    Diabetes  He presents for his follow-up diabetic visit. He has type 2 diabetes mellitus. His disease course has been improving. There are no hypoglycemic associated symptoms. Pertinent negatives for hypoglycemia include no dizziness, headaches, nervousness/anxiousness, pallor or sweats. Associated symptoms include fatigue and polydipsia. Pertinent negatives for diabetes include no blurred vision, no chest pain, no foot paresthesias and no polyuria. There are no hypoglycemic complications. There are no diabetic complications. Pertinent negatives for diabetic complications include no CVA, heart disease, impotence or nephropathy. Current diabetic treatment includes oral agent (dual therapy) and insulin injections. He is following a diabetic and generally healthy diet. He monitors blood glucose at home 1-2 x per day. His breakfast blood glucose range is generally 180-200 mg/dl. An ACE inhibitor/angiotensin II receptor blocker is being taken. Eye exam is current.  Hypertension  This is a chronic problem. The problem is unchanged. The problem is controlled. Pertinent negatives include no blurred vision, chest pain, headaches, orthopnea, palpitations, shortness of breath or sweats. Past treatments include ACE inhibitors, diuretics and beta blockers. There is no history of CVA.  Hyperlipidemia  This is a chronic problem. The problem is controlled. Recent lipid tests were reviewed and are normal. Pertinent negatives include no chest pain, focal weakness, leg pain or shortness of breath. Current antihyperlipidemic treatment includes statins.      Past Medical History:  Diagnosis Date  . Anemia    iron deficiency  . Bipolar 1 disorder, manic, moderate ([place])   . Depression   . Diabetes  mellitus without complication ([place])   . GERD (gastroesophageal reflux disease)   . Hyperlipidemia   . Hypertension   . Insomnia   . Parkinson disease ([place])   . Peptic ulcer disease with hemorrhage     Past Surgical History:  Procedure Laterality Date  . ESOPHAGOGASTRODUODENOSCOPY Left 03/16/2016   Procedure: ESOPHAGOGASTRODUODENOSCOPY (EGD);  Surgeon: Manus Gunning, MD;  Location: [place];  Service: Gastroenterology;  Laterality: Left;  . FOOT FRACTURE SURGERY Right     Family History  Problem Relation Age of Onset  . Cancer Mother   . Heart disease Father     Social History   Social History  . Marital status: Married    Spouse name: N/A  . Number of children: N/A  . Years of education: N/A   Occupational History  . Not on file.   Social History Main Topics  . Smoking status: Never Smoker  . Smokeless tobacco: Never Used  . Alcohol use No  . Drug use: No  . Sexual activity: Yes    Partners: Female   Other Topics Concern  . Not on file   Social History Narrative  . No narrative on file     Current Outpatient Prescriptions:  .  acetaminophen (TYLENOL) 500 MG tablet, Take 1,000 mg by mouth every 6 (six) hours as needed (pain)., Disp: , Rfl:  .  b complex vitamins capsule, Take 1 capsule by mouth daily., Disp: , Rfl:  .  carbidopa-levodopa (SINEMET CR) 50-200 MG per tablet, Take 1 tablet by mouth 3 (three) times daily. , Disp: , Rfl:  .  DULoxetine (CYMBALTA) 60 MG capsule, Take 120 mg by mouth at bedtime. , Disp: ,  Rfl:  .  famotidine (PEPCID) 40 MG tablet, Take 1 tablet (40 mg total) by mouth every evening., Disp: 30 tablet, Rfl: 1 .  glipiZIDE (GLUCOTROL) 5 MG tablet, Take 1 tablet (5 mg total) by mouth daily before breakfast., Disp: 90 tablet, Rfl: 0 .  Insulin Degludec-Liraglutide (XULTOPHY) 100-3.6 UNIT-MG/ML SOPN, Inject 16 Units into the skin at bedtime., Disp: 15 mL, Rfl: 2 .  lamoTRIgine (LAMICTAL) 200 MG tablet, Take 200 mg by mouth at  bedtime. , Disp: , Rfl:  .  LORazepam (ATIVAN) 0.5 MG tablet, Take 0.5 mg by mouth 3 (three) times daily as needed for anxiety. , Disp: , Rfl:  .  LYRICA 50 MG capsule, tk 1 capsule at bedtime, Disp: , Rfl: 2 .  metFORMIN (GLUCOPHAGE) 1000 MG tablet, Take 1 tablet (1,000 mg total) by mouth 2 (two) times daily with a meal., Disp: 180 tablet, Rfl: 0 .  ondansetron (ZOFRAN) 4 MG tablet, Take 1 tablet (4 mg total) by mouth every 8 (eight) hours as needed for nausea or vomiting., Disp: 20 tablet, Rfl: 0 .  pantoprazole (PROTONIX) 40 MG tablet, Take 1 tablet (40 mg total) by mouth daily., Disp: 30 tablet, Rfl: 0 .  propranolol ER (INDERAL LA) 60 MG 24 hr capsule, Take 60 mg by mouth at bedtime. , Disp: , Rfl:  .  QUEtiapine (SEROQUEL XR) 400 MG 24 hr tablet, TK 1 T PO QD AT 8PM ONE HOUR BEFORE OR AFTER A MEAL, Disp: , Rfl: 2 .  quinapril-hydrochlorothiazide (ACCURETIC) 20-12.5 MG tablet, TAKE 2 TABLETS BY MOUTH DAILY, Disp: 180 tablet, Rfl: 0 .  rOPINIRole (REQUIP) 1 MG tablet, Take 1 mg by mouth at bedtime., Disp: , Rfl:  .  simvastatin (ZOCOR) 40 MG tablet, TAKE 1 TABLET BY MOUTH AT BEDTIME, Disp: 90 tablet, Rfl: 0 .  sucralfate (CARAFATE) 1 g tablet, Take 1 tablet (1 g total) by mouth 4 (four) times daily., Disp: 60 tablet, Rfl: 0  Current Facility-Administered Medications:  .  0.9 %  sodium chloride infusion, 500 mL, Intravenous, Continuous, Armbruster, Renelda Loma, MD  Facility-Administered Medications Ordered in Other Visits:  .  0.9 %  sodium chloride infusion, , Intravenous, Once, Kale, Cloria Spring, MD .  alteplase (CATHFLO ACTIVASE) injection 2 mg, 2 mg, Intracatheter, Once PRN, Brunetta Genera, MD .  heparin lock flush 100 unit/mL, 500 Units, Intracatheter, Once PRN, Irene Limbo, Cloria Spring, MD .  heparin lock flush 100 unit/mL, 250 Units, Intracatheter, Once PRN, Brunetta Genera, MD .  sodium chloride 0.9 % injection 10 mL, 10 mL, Intracatheter, PRN, Brunetta Genera, MD .   sodium chloride 0.9 % injection 3 mL, 3 mL, Intravenous, Once PRN, Brunetta Genera, MD  No Known Allergies   Review of Systems  Constitutional: Positive for fatigue.  Eyes: Negative for blurred vision.  Respiratory: Negative for shortness of breath.   Cardiovascular: Negative for chest pain, palpitations and orthopnea.  Genitourinary: Negative for impotence.  Skin: Negative for pallor.  Neurological: Negative for dizziness, focal weakness and headaches.  Endo/Heme/Allergies: Positive for polydipsia.  Psychiatric/Behavioral: The patient is not nervous/anxious.       Objective  Vitals:   04/05/17 1324 04/05/17 1333  BP: 128/76   Pulse: (!) 105 (!) 103  Resp: 16   Temp: 98.3 F (36.8 C)   TempSrc: Oral   SpO2: 96%   Weight: 228 lb 14.4 oz (103.8 kg)   Height: 5\' 11"  (1.803 m)     Physical Exam  Constitutional:  He is oriented to person, place, and time and well-developed, well-nourished, and in no distress.  HENT:  Head: Normocephalic and atraumatic.  Mouth/Throat: Oropharynx is clear and moist.  Cardiovascular: Regular rhythm, S1 normal, S2 normal and normal heart sounds.  Tachycardia present.   No murmur heard. Pulmonary/Chest: Effort normal and breath sounds normal. He has no wheezes.  Abdominal: Soft. Bowel sounds are normal. There is no tenderness.  Musculoskeletal:       Right ankle: He exhibits no swelling.       Left ankle: He exhibits no swelling.  Neurological: He is alert and oriented to person, place, and time.  Psychiatric: Mood, memory, affect and judgment normal.  Nursing note and vitals reviewed.     Assessment & Plan  1. Uncontrolled type 2 diabetes mellitus without complication, with long-term current use of insulin (HCC) Interval A1c is 9.1%, improved but still poorly controlled, continue on insulin and oral regimen, advised to increase physical activity. - POCT HgB A1C - Urine Microalbumin w/creat. ratio - metFORMIN (GLUCOPHAGE) 1000 MG  tablet; Take 1 tablet (1,000 mg total) by mouth 2 (two) times daily with a meal.  Dispense: 180 tablet; Refill: 0 - Insulin Degludec-Liraglutide (XULTOPHY) 100-3.6 UNIT-MG/ML SOPN; Inject 16 Units into the skin at bedtime.  Dispense: 15 mL; Refill: 2 - glipiZIDE (GLUCOTROL) 5 MG tablet; Take 1 tablet (5 mg total) by mouth daily before breakfast.  Dispense: 90 tablet; Refill: 0  2. Essential hypertension - quinapril-hydrochlorothiazide (ACCURETIC) 20-12.5 MG tablet; Take 2 tablets by mouth daily.  Dispense: 180 tablet; Refill: 0  3. Pure hypercholesterolemia  - Lipid panel - COMPLETE METABOLIC PANEL WITH GFR - simvastatin (ZOCOR) 40 MG tablet; Take 1 tablet (40 mg total) by mouth at bedtime.  Dispense: 90 tablet; Refill: 0   Maycen Degregory [facility] 04/05/2017 1:42 PM

## 2017-04-09 DIAGNOSIS — Z6832 Body mass index (BMI) 32.0-32.9, adult: Secondary | ICD-10-CM | POA: Diagnosis not present

## 2017-04-09 DIAGNOSIS — G2581 Restless legs syndrome: Secondary | ICD-10-CM | POA: Diagnosis not present

## 2017-04-09 DIAGNOSIS — G2111 Neuroleptic induced parkinsonism: Secondary | ICD-10-CM | POA: Diagnosis not present

## 2017-04-09 DIAGNOSIS — F319 Bipolar disorder, unspecified: Secondary | ICD-10-CM | POA: Diagnosis not present

## 2017-04-09 DIAGNOSIS — E6609 Other obesity due to excess calories: Secondary | ICD-10-CM | POA: Diagnosis not present

## 2017-04-09 DIAGNOSIS — G3184 Mild cognitive impairment, so stated: Secondary | ICD-10-CM | POA: Diagnosis not present

## 2017-04-22 DIAGNOSIS — F3181 Bipolar II disorder: Secondary | ICD-10-CM | POA: Diagnosis not present

## 2017-04-22 DIAGNOSIS — G2581 Restless legs syndrome: Secondary | ICD-10-CM | POA: Diagnosis not present

## 2017-04-22 DIAGNOSIS — G4726 Circadian rhythm sleep disorder, shift work type: Secondary | ICD-10-CM | POA: Diagnosis not present

## 2017-05-04 ENCOUNTER — Telehealth: Payer: Self-pay | Admitting: Family Medicine

## 2017-05-04 NOTE — Telephone Encounter (Signed)
Paperwork for DM shoes has been placed on Dr. Trena Platt desk for review and approval on 05/04/2017

## 2017-05-04 NOTE — Telephone Encounter (Signed)
Paperwork for pt for DM shoes put in folder and on [employer].

## 2017-05-05 DIAGNOSIS — G2581 Restless legs syndrome: Secondary | ICD-10-CM | POA: Diagnosis not present

## 2017-05-05 DIAGNOSIS — F3181 Bipolar II disorder: Secondary | ICD-10-CM | POA: Diagnosis not present

## 2017-05-05 DIAGNOSIS — G4726 Circadian rhythm sleep disorder, shift work type: Secondary | ICD-10-CM | POA: Diagnosis not present

## 2017-05-12 ENCOUNTER — Other Ambulatory Visit: Payer: Self-pay

## 2017-05-12 NOTE — Telephone Encounter (Signed)
Patient requesting refill of Lyrica to [facility].

## 2017-05-13 MED ORDER — LYRICA 50 MG PO CAPS: ORAL_CAPSULE | ORAL | 2 refills | Status: DC

## 2017-05-17 DIAGNOSIS — G4726 Circadian rhythm sleep disorder, shift work type: Secondary | ICD-10-CM | POA: Diagnosis not present

## 2017-05-17 DIAGNOSIS — G2581 Restless legs syndrome: Secondary | ICD-10-CM | POA: Diagnosis not present

## 2017-05-17 DIAGNOSIS — F3181 Bipolar II disorder: Secondary | ICD-10-CM | POA: Diagnosis not present

## 2017-05-31 NOTE — Telephone Encounter (Signed)
Bryan Flowers  from [place] is checking status on the paperwork that was bought in July for DM shoes. States the shoes have come in and she is just waiting on the order. Please return call  (W) [phone] (Work Cell) [phone]

## 2017-06-01 NOTE — Telephone Encounter (Signed)
Left voicemail for Bryan Flowers at [facility] concerning diabetic shoes paperwork per Dr. [name] patient does not qualify for any of the conditions and that he needs to be seen by a podiatrist to evaluate his feet and vascular surgery to check for poor circulation

## 2017-06-09 ENCOUNTER — Ambulatory Visit (INDEPENDENT_AMBULATORY_CARE_PROVIDER_SITE_OTHER): Payer: PPO | Admitting: Family Medicine

## 2017-06-09 ENCOUNTER — Encounter: Payer: Self-pay | Admitting: Family Medicine

## 2017-06-09 ENCOUNTER — Other Ambulatory Visit: Payer: Self-pay

## 2017-06-09 VITALS — BP 124/82 | HR 96 | Temp 98.7°F | Resp 14 | Wt 228.9 lb

## 2017-06-09 DIAGNOSIS — R0989 Other specified symptoms and signs involving the circulatory and respiratory systems: Secondary | ICD-10-CM

## 2017-06-09 DIAGNOSIS — H9202 Otalgia, left ear: Secondary | ICD-10-CM

## 2017-06-09 DIAGNOSIS — H6982 Other specified disorders of Eustachian tube, left ear: Secondary | ICD-10-CM

## 2017-06-09 DIAGNOSIS — J302 Other seasonal allergic rhinitis: Secondary | ICD-10-CM

## 2017-06-09 DIAGNOSIS — Z794 Long term (current) use of insulin: Principal | ICD-10-CM

## 2017-06-09 DIAGNOSIS — E1165 Type 2 diabetes mellitus with hyperglycemia: Principal | ICD-10-CM

## 2017-06-09 DIAGNOSIS — IMO0001 Reserved for inherently not codable concepts without codable children: Secondary | ICD-10-CM

## 2017-06-09 MED ORDER — CETIRIZINE HCL 10 MG PO TABS: 10.0000 mg | ORAL_TABLET | Freq: Every day | ORAL | 1 refills | Status: DC

## 2017-06-09 MED ORDER — FLUTICASONE PROPIONATE 50 MCG/ACT NA SUSP
2.0000 | Freq: Every day | NASAL | 2 refills | Status: DC
Start: 2017-06-09 — End: 2019-02-16

## 2017-06-09 NOTE — Progress Notes (Addendum)
Name: Bryan Flowers   MRN: [id number]    DOB: 09-Jun-1961   Date:06/09/2017       Progress Note  Subjective  Chief Complaint  Chief Complaint  Patient presents with  . Ear Pain    left, with sore throat and headaches    HPI  Pt presents with 6 day history of LEFT ear fullness and pain.  He has a history of ear infections many years ago.  Endorses nasal congestion, sore throat, some decreased hearing in left ear only.  Denies fevers/chills, cough, chest pain, or shortness of breath.  Pain is currently 5/10 pain.  Has seasonal allergies and has taken Zyrtec in the past and this worked well for him. Has used flonase in the past as well - tolerated well, and it worked well for him.  Patient Active Problem List   Diagnosis Date Noted  . Encounter for examination for driving license [id number]  . Annual physical exam 10/01/2016  . Bilateral leg pain 08/20/2016  . Musculoskeletal leg pain, left 07/21/2016  . Fatigue 05/18/2016  . Sore throat 04/16/2016  . Iron deficiency anemia 03/30/2016  . Normocytic anemia due to blood loss 03/27/2016  . Anemia associated with acute blood loss 03/24/2016  . Multiple gastric ulcers   . Duodenal ulcer   . Anemia 03/15/2016  . GI bleed 03/15/2016  . Hyponatremia 03/15/2016  . Acute blood loss anemia 03/15/2016  . Parkinson disease ([place])   . Hypertension   . Subacute maxillary sinusitis 02/27/2016  . Anxiety 08/19/2015  . Type 2 diabetes mellitus ([place]) 08/19/2015  . Abnormal ECG 04/09/2015  . Back ache 04/09/2015  . Clinical depression 04/09/2015  . Uncontrolled type 2 diabetes mellitus ([place]) 04/09/2015  . Obstructive sleep apnea 04/09/2015  . HDL deficiency 04/09/2015  . Hearing loss, sensorineural, combined types 04/09/2015  . HLD (hyperlipidemia) 04/09/2015  . BP (high blood pressure) 04/09/2015  . Idiopathic Parkinson's disease ([place]) 04/09/2015  . Fibrositis 03/15/2014  . Fibromyalgia 03/15/2014  . Bipolar I disorder, single manic  episode, moderate ([place]) 03/17/2010  . Testicular hypofunction 05/17/2009  . Migraine with aura 05/14/2009  . Anxiety state 03/19/2008  . Cannot sleep 03/05/2008  . Reflux esophagitis 07/12/2007    Social History  Substance Use Topics  . Smoking status: Never Smoker  . Smokeless tobacco: Never Used  . Alcohol use No     Current Outpatient Prescriptions:  .  acetaminophen (TYLENOL) 500 MG tablet, Take 1,000 mg by mouth every 6 (six) hours as needed (pain)., Disp: , Rfl:  .  b complex vitamins capsule, Take 1 capsule by mouth daily., Disp: , Rfl:  .  carbidopa-levodopa (SINEMET CR) 50-200 MG per tablet, Take 1 tablet by mouth 3 (three) times daily. , Disp: , Rfl:  .  DULoxetine (CYMBALTA) 60 MG capsule, Take 120 mg by mouth at bedtime. , Disp: , Rfl:  .  famotidine (PEPCID) 40 MG tablet, Take 1 tablet (40 mg total) by mouth every evening., Disp: 30 tablet, Rfl: 1 .  glipiZIDE (GLUCOTROL) 5 MG tablet, Take 1 tablet (5 mg total) by mouth daily before breakfast., Disp: 90 tablet, Rfl: 0 .  Insulin Degludec-Liraglutide (XULTOPHY) 100-3.6 UNIT-MG/ML SOPN, Inject 16 Units into the skin at bedtime., Disp: 15 mL, Rfl: 2 .  lamoTRIgine (LAMICTAL) 200 MG tablet, Take 200 mg by mouth at bedtime. , Disp: , Rfl:  .  LORazepam (ATIVAN) 0.5 MG tablet, Take 0.5 mg by mouth 3 (three) times daily as needed for anxiety. , Disp: ,  Rfl:  .  LYRICA 50 MG capsule, tk 1 capsule at bedtime, Disp: 30 capsule, Rfl: 2 .  metFORMIN (GLUCOPHAGE) 1000 MG tablet, Take 1 tablet (1,000 mg total) by mouth 2 (two) times daily with a meal., Disp: 180 tablet, Rfl: 0 .  ondansetron (ZOFRAN) 4 MG tablet, Take 1 tablet (4 mg total) by mouth every 8 (eight) hours as needed for nausea or vomiting., Disp: 20 tablet, Rfl: 0 .  pantoprazole (PROTONIX) 40 MG tablet, Take 1 tablet (40 mg total) by mouth daily., Disp: 30 tablet, Rfl: 0 .  propranolol ER (INDERAL LA) 60 MG 24 hr capsule, Take 60 mg by mouth at bedtime. , Disp: , Rfl:  .   QUEtiapine (SEROQUEL XR) 400 MG 24 hr tablet, TK 1 T PO QD AT 8PM ONE HOUR BEFORE OR AFTER A MEAL, Disp: , Rfl: 2 .  quinapril-hydrochlorothiazide (ACCURETIC) 20-12.5 MG tablet, Take 2 tablets by mouth daily., Disp: 180 tablet, Rfl: 0 .  rOPINIRole (REQUIP) 1 MG tablet, Take 1 mg by mouth at bedtime., Disp: , Rfl:  .  simvastatin (ZOCOR) 40 MG tablet, Take 1 tablet (40 mg total) by mouth at bedtime., Disp: 90 tablet, Rfl: 0 .  sucralfate (CARAFATE) 1 g tablet, Take 1 tablet (1 g total) by mouth 4 (four) times daily., Disp: 60 tablet, Rfl: 0 .  cetirizine (ZYRTEC) 10 MG tablet, Take 1 tablet (10 mg total) by mouth daily., Disp: 90 tablet, Rfl: 1 .  fluticasone (FLONASE) 50 MCG/ACT nasal spray, Place 2 sprays into both nostrils daily., Disp: 16 g, Rfl: 2  No Known Allergies  ROS  Ten systems reviewed and is negative except as mentioned in HPI  Objective  Vitals:   06/09/17 1340 06/09/17 1413  BP: 124/82   Pulse: (!) 105 96  Resp: 14   Temp: 98.7 F (37.1 C)   TempSrc: Oral   SpO2: 96%   Weight: 228 lb 14.4 oz (103.8 kg)    Body mass index is 31.93 kg/m.  Nursing Note and Vital Signs reviewed.  Auscultated rate is 94-96bpm, review shows mildly elevated heart rate at several recent with with Dr. [name] - appears to be baseline for patient.  Physical Exam  Constitutional: Patient appears well-developed and well-nourished. Obese No distress.  HEENT: head atraumatic, normocephalic, pupils equal and reactive to light, EOM's intact, LEFT TM without erythema or bulging, RIGHT TM positive for bulging - no erythema or retraction, mild LEFT sided maxillary sinus pain on palpation, neck supple without lymphadenopathy, oropharynx pink and moist without exudate Cardiovascular: Normal rate, regular rhythm, S1/S2 present, murmur present (baseline).  No murmur or rub heard. No BLE edema. Pulmonary/Chest: Effort normal and breath sounds clear. No respiratory distress or retractions. Psychiatric:  Patient has a normal mood and affect. behavior is normal. Judgment and thought content normal.  Recent Results (from the past 2160 hour(s))  Lipase, blood     Status: None   Collection Time: 03/19/17  6:28 PM  Result Value Ref Range   Lipase 26 11 - 51 U/L  Comprehensive metabolic panel     Status: Abnormal   Collection Time: 03/19/17  6:28 PM  Result Value Ref Range   Sodium 134 (L) 135 - 145 mmol/L   Potassium 4.2 3.5 - 5.1 mmol/L   Chloride 101 101 - 111 mmol/L   CO2 24 22 - 32 mmol/L   Glucose, Bld 199 (H) 65 - 99 mg/dL   BUN 23 (H) 6 - 20 mg/dL   Creatinine,  Ser 1.34 (H) 0.61 - 1.24 mg/dL   Calcium 9.0 8.9 - 10.3 mg/dL   Total Protein 8.1 6.5 - 8.1 g/dL   Albumin 4.4 3.5 - 5.0 g/dL   AST 22 15 - 41 U/L   ALT 10 (L) 17 - 63 U/L   Alkaline Phosphatase 127 (H) 38 - 126 U/L   Total Bilirubin 0.6 0.3 - 1.2 mg/dL   GFR calc non Af Amer 58 (L) >60 mL/min   GFR calc Af Amer >60 >60 mL/min    Comment: (NOTE) The eGFR has been calculated using the CKD EPI equation. This calculation has not been validated in all clinical situations. eGFR's persistently <60 mL/min signify possible Chronic Kidney Disease.    Anion gap 9 5 - 15  CBC     Status: Abnormal   Collection Time: 03/19/17  6:28 PM  Result Value Ref Range   WBC 14.3 (H) 3.8 - 10.6 K/uL   RBC 5.58 4.40 - 5.90 MIL/uL   Hemoglobin 16.8 13.0 - 18.0 g/dL   HCT 48.8 40.0 - 52.0 %   MCV 87.4 80.0 - 100.0 fL   MCH 30.2 26.0 - 34.0 pg   MCHC 34.5 32.0 - 36.0 g/dL   RDW 13.5 11.5 - 14.5 %   Platelets 393 150 - 440 K/uL  Urinalysis, Complete w Microscopic     Status: Abnormal   Collection Time: 03/19/17  9:27 PM  Result Value Ref Range   Color, Urine YELLOW (A) YELLOW   APPearance CLEAR (A) CLEAR   Specific Gravity, Urine 1.016 1.005 - 1.030   pH 5.0 5.0 - 8.0   Glucose, UA NEGATIVE NEGATIVE mg/dL   Hgb urine dipstick NEGATIVE NEGATIVE   Bilirubin Urine NEGATIVE NEGATIVE   Ketones, ur 5 (A) NEGATIVE mg/dL   Protein, ur  NEGATIVE NEGATIVE mg/dL   Nitrite NEGATIVE NEGATIVE   Leukocytes, UA NEGATIVE NEGATIVE   RBC / HPF NONE SEEN 0 - 5 RBC/hpf   WBC, UA 0-5 0 - 5 WBC/hpf   Bacteria, UA NONE SEEN NONE SEEN   Squamous Epithelial / LPF 0-5 (A) NONE SEEN   Mucus PRESENT    Hyaline Casts, UA PRESENT   COMPLETE METABOLIC PANEL WITH GFR     Status: Abnormal   Collection Time: 03/23/17  9:51 AM  Result Value Ref Range   Sodium 139 135 - 146 mmol/L   Potassium 4.2 3.5 - 5.3 mmol/L   Chloride 101 98 - 110 mmol/L   CO2 26 20 - 31 mmol/L   Glucose, Bld 198 (H) 65 - 99 mg/dL   BUN 14 7 - 25 mg/dL   Creat 0.92 0.70 - 1.33 mg/dL    Comment:   For patients > or = 56 years of age: The upper reference limit for Creatinine is approximately 13% higher for people identified as African-American.      Total Bilirubin 0.3 0.2 - 1.2 mg/dL   Alkaline Phosphatase 106 40 - 115 U/L   AST 21 10 - 35 U/L   ALT 34 9 - 46 U/L   Total Protein 6.7 6.1 - 8.1 g/dL   Albumin 4.3 3.6 - 5.1 g/dL   Calcium 9.4 8.6 - 10.3 mg/dL   GFR, Est African American >89 >=60 mL/min   GFR, Est Non African American >89 >=60 mL/min  Lipase, blood     Status: None   Collection Time: 03/30/17  2:24 PM  Result Value Ref Range   Lipase 38 11 - 51 U/L  Comprehensive metabolic panel     Status: Abnormal   Collection Time: 03/30/17  2:24 PM  Result Value Ref Range   Sodium 138 135 - 145 mmol/L   Potassium 3.8 3.5 - 5.1 mmol/L   Chloride 103 101 - 111 mmol/L   CO2 28 22 - 32 mmol/L   Glucose, Bld 184 (H) 65 - 99 mg/dL   BUN 14 6 - 20 mg/dL   Creatinine, Ser 1.03 0.61 - 1.24 mg/dL   Calcium 8.8 (L) 8.9 - 10.3 mg/dL   Total Protein 6.9 6.5 - 8.1 g/dL   Albumin 3.9 3.5 - 5.0 g/dL   AST 19 15 - 41 U/L   ALT 7 (L) 17 - 63 U/L   Alkaline Phosphatase 80 38 - 126 U/L   Total Bilirubin 0.5 0.3 - 1.2 mg/dL   GFR calc non Af Amer >60 >60 mL/min   GFR calc Af Amer >60 >60 mL/min    Comment: (NOTE) The eGFR has been calculated using the CKD EPI  equation. This calculation has not been validated in all clinical situations. eGFR's persistently <60 mL/min signify possible Chronic Kidney Disease.    Anion gap 7 5 - 15  CBC     Status: Abnormal   Collection Time: 03/30/17  2:24 PM  Result Value Ref Range   WBC 12.8 (H) 3.8 - 10.6 K/uL   RBC 4.47 4.40 - 5.90 MIL/uL   Hemoglobin 13.4 13.0 - 18.0 g/dL   HCT 38.9 (L) 40.0 - 52.0 %   MCV 87.0 80.0 - 100.0 fL   MCH 30.0 26.0 - 34.0 pg   MCHC 34.5 32.0 - 36.0 g/dL   RDW 13.7 11.5 - 14.5 %   Platelets 246 150 - 440 K/uL  POCT HgB A1C     Status: Abnormal   Collection Time: 04/05/17  1:36 PM  Result Value Ref Range   Hemoglobin A1C 9.1      Assessment & Plan  1. Dysfunction of left eustachian tube - cetirizine (ZYRTEC) 10 MG tablet; Take 1 tablet (10 mg total) by mouth daily.  Dispense: 90 tablet; Refill: 1 - fluticasone (FLONASE) 50 MCG/ACT nasal spray; Place 2 sprays into both nostrils daily.  Dispense: 16 g; Refill: 2  2. Seasonal allergic rhinitis, unspecified trigger - cetirizine (ZYRTEC) 10 MG tablet; Take 1 tablet (10 mg total) by mouth daily.  Dispense: 90 tablet; Refill: 1 - fluticasone (FLONASE) 50 MCG/ACT nasal spray; Place 2 sprays into both nostrils daily.  Dispense: 16 g; Refill: 2  3. Left ear pain Advised to take PO Tylenol PRN for pain.  -Red flags and when to present for emergency care or RTC including fever >101.53F, severe pain, significantly diminished hearing,  new/worsening/un-resolving symptoms, reviewed with patient at time of visit. Follow up and care instructions discussed and provided in AVS.  I have reviewed this encounter including the documentation in this note and/or discussed this patient with the Johney Maine, FNP, NP-C. I am certifying that I agree with the content of this note as supervising physician.  Steele Sizer, MD [facility] 06/12/2017, 4:34 PM

## 2017-06-17 ENCOUNTER — Ambulatory Visit (INDEPENDENT_AMBULATORY_CARE_PROVIDER_SITE_OTHER): Payer: PPO | Admitting: Family Medicine

## 2017-06-17 ENCOUNTER — Encounter: Payer: Self-pay | Admitting: Family Medicine

## 2017-06-17 VITALS — BP 124/76 | HR 100 | Temp 97.8°F | Resp 14 | Ht 71.0 in | Wt 233.1 lb

## 2017-06-17 DIAGNOSIS — J0110 Acute frontal sinusitis, unspecified: Secondary | ICD-10-CM

## 2017-06-17 MED ORDER — AMOXICILLIN-POT CLAVULANATE 875-125 MG PO TABS: 1.0000 | ORAL_TABLET | Freq: Two times a day (BID) | ORAL | 0 refills | Status: DC

## 2017-06-17 NOTE — Progress Notes (Signed)
Name: Bryan Flowers   MRN: [id number]    DOB: 04/11/1961   Date:06/17/2017       Progress Note  Subjective  Chief Complaint  Chief Complaint  Patient presents with  . Ear Pain    left ear drainage and pain. Pt states it feels like its full. Happen 2 weeks ago  . scratchy throat    3 days now     Otalgia   There is pain in the left ear. This is a new problem. The current episode started 1 to 4 weeks ago (2 weeks ago). There has been no fever. Associated symptoms include coughing (scratchy thraot causing him to have cough) and a sore throat. Pertinent negatives include no ear discharge or neck pain. Treatments tried: has tried Flonase and Zyrtec for relief.  The treatment provided no relief.     Past Medical History:  Diagnosis Date  . Anemia    iron deficiency  . Bipolar 1 disorder, manic, moderate ([place])   . Depression   . Diabetes mellitus without complication ([place])   . GERD (gastroesophageal reflux disease)   . Hyperlipidemia   . Hypertension   . Insomnia   . Parkinson disease ([place])   . Peptic ulcer disease with hemorrhage     Past Surgical History:  Procedure Laterality Date  . ESOPHAGOGASTRODUODENOSCOPY Left 03/16/2016   Procedure: ESOPHAGOGASTRODUODENOSCOPY (EGD);  Surgeon: Manus Gunning, MD;  Location: [facility];  Service: Gastroenterology;  Laterality: Left;  . FOOT FRACTURE SURGERY Right     Family History  Problem Relation Age of Onset  . Cancer Mother   . Heart disease Father     Social History   Social History  . Marital status: Married    Spouse name: N/A  . Number of children: N/A  . Years of education: N/A   Occupational History  . Not on file.   Social History Main Topics  . Smoking status: Never Smoker  . Smokeless tobacco: Never Used  . Alcohol use No  . Drug use: No  . Sexual activity: Yes    Partners: Female   Other Topics Concern  . Not on file   Social History Narrative  . No narrative on file     Current  Outpatient Prescriptions:  .  acetaminophen (TYLENOL) 500 MG tablet, Take 1,000 mg by mouth every 6 (six) hours as needed (pain)., Disp: , Rfl:  .  b complex vitamins capsule, Take 1 capsule by mouth daily., Disp: , Rfl:  .  carbidopa-levodopa (SINEMET CR) 50-200 MG per tablet, Take 1 tablet by mouth 3 (three) times daily. , Disp: , Rfl:  .  cetirizine (ZYRTEC) 10 MG tablet, Take 1 tablet (10 mg total) by mouth daily., Disp: 90 tablet, Rfl: 1 .  DULoxetine (CYMBALTA) 60 MG capsule, Take 120 mg by mouth at bedtime. , Disp: , Rfl:  .  famotidine (PEPCID) 40 MG tablet, Take 1 tablet (40 mg total) by mouth every evening., Disp: 30 tablet, Rfl: 1 .  fluticasone (FLONASE) 50 MCG/ACT nasal spray, Place 2 sprays into both nostrils daily., Disp: 16 g, Rfl: 2 .  glipiZIDE (GLUCOTROL) 5 MG tablet, Take 1 tablet (5 mg total) by mouth daily before breakfast., Disp: 90 tablet, Rfl: 0 .  Insulin Degludec-Liraglutide (XULTOPHY) 100-3.6 UNIT-MG/ML SOPN, Inject 16 Units into the skin at bedtime., Disp: 15 mL, Rfl: 2 .  lamoTRIgine (LAMICTAL) 200 MG tablet, Take 200 mg by mouth at bedtime. , Disp: , Rfl:  .  LORazepam (  ATIVAN) 0.5 MG tablet, Take 0.5 mg by mouth 3 (three) times daily as needed for anxiety. , Disp: , Rfl:  .  LYRICA 50 MG capsule, tk 1 capsule at bedtime, Disp: 30 capsule, Rfl: 2 .  metFORMIN (GLUCOPHAGE) 1000 MG tablet, Take 1 tablet (1,000 mg total) by mouth 2 (two) times daily with a meal., Disp: 180 tablet, Rfl: 0 .  ondansetron (ZOFRAN) 4 MG tablet, Take 1 tablet (4 mg total) by mouth every 8 (eight) hours as needed for nausea or vomiting., Disp: 20 tablet, Rfl: 0 .  pantoprazole (PROTONIX) 40 MG tablet, Take 1 tablet (40 mg total) by mouth daily., Disp: 30 tablet, Rfl: 0 .  propranolol ER (INDERAL LA) 60 MG 24 hr capsule, Take 60 mg by mouth at bedtime. , Disp: , Rfl:  .  QUEtiapine (SEROQUEL XR) 400 MG 24 hr tablet, TK 1 T PO QD AT 8PM ONE HOUR BEFORE OR AFTER A MEAL, Disp: , Rfl: 2 .   quinapril-hydrochlorothiazide (ACCURETIC) 20-12.5 MG tablet, Take 2 tablets by mouth daily., Disp: 180 tablet, Rfl: 0 .  rOPINIRole (REQUIP) 1 MG tablet, Take 1 mg by mouth at bedtime., Disp: , Rfl:  .  simvastatin (ZOCOR) 40 MG tablet, Take 1 tablet (40 mg total) by mouth at bedtime., Disp: 90 tablet, Rfl: 0 .  sucralfate (CARAFATE) 1 g tablet, Take 1 tablet (1 g total) by mouth 4 (four) times daily., Disp: 60 tablet, Rfl: 0  No Known Allergies   Review of Systems  HENT: Positive for ear pain and sore throat. Negative for ear discharge.   Respiratory: Positive for cough (scratchy thraot causing him to have cough).   Musculoskeletal: Negative for neck pain.      Objective  Vitals:   06/17/17 1533 06/17/17 1538  BP: 124/76   Pulse: (!) 103 100  Resp: 14   Temp: 97.8 F (36.6 C)   TempSrc: Oral   SpO2: 97%   Weight: 233 lb 1.6 oz (105.7 kg)   Height: 5' 11"  (1.803 m)     Physical Exam  Constitutional: He is oriented to person, place, and time and well-developed, well-nourished, and in no distress.  HENT:  Head: Normocephalic and atraumatic.  Right Ear: External ear normal.  Left Ear: External ear normal.  Nose: Right sinus exhibits frontal sinus tenderness. Right sinus exhibits no maxillary sinus tenderness. Left sinus exhibits frontal sinus tenderness. Left sinus exhibits no maxillary sinus tenderness.  Mouth/Throat: Oropharynx is clear and moist.  Eyes: Pupils are equal, round, and reactive to light.  Neck: Neck supple.  Cardiovascular: Normal rate, regular rhythm and normal heart sounds.   No murmur heard. Pulmonary/Chest: Effort normal and breath sounds normal. He has no wheezes.  Neurological: He is alert and oriented to person, place, and time.  Psychiatric: Mood, memory, affect and judgment normal.  Nursing note and vitals reviewed.    Recent Results (from the past 2160 hour(s))  Lipase, blood     Status: None   Collection Time: 03/19/17  6:28 PM  Result  Value Ref Range   Lipase 26 11 - 51 U/L  Comprehensive metabolic panel     Status: Abnormal   Collection Time: 03/19/17  6:28 PM  Result Value Ref Range   Sodium 134 (L) 135 - 145 mmol/L   Potassium 4.2 3.5 - 5.1 mmol/L   Chloride 101 101 - 111 mmol/L   CO2 24 22 - 32 mmol/L   Glucose, Bld 199 (H) 65 - 99 mg/dL  BUN 23 (H) 6 - 20 mg/dL   Creatinine, Ser 1.34 (H) 0.61 - 1.24 mg/dL   Calcium 9.0 8.9 - 10.3 mg/dL   Total Protein 8.1 6.5 - 8.1 g/dL   Albumin 4.4 3.5 - 5.0 g/dL   AST 22 15 - 41 U/L   ALT 10 (L) 17 - 63 U/L   Alkaline Phosphatase 127 (H) 38 - 126 U/L   Total Bilirubin 0.6 0.3 - 1.2 mg/dL   GFR calc non Af Amer 58 (L) >60 mL/min   GFR calc Af Amer >60 >60 mL/min    Comment: (NOTE) The eGFR has been calculated using the CKD EPI equation. This calculation has not been validated in all clinical situations. eGFR's persistently <60 mL/min signify possible Chronic Kidney Disease.    Anion gap 9 5 - 15  CBC     Status: Abnormal   Collection Time: 03/19/17  6:28 PM  Result Value Ref Range   WBC 14.3 (H) 3.8 - 10.6 K/uL   RBC 5.58 4.40 - 5.90 MIL/uL   Hemoglobin 16.8 13.0 - 18.0 g/dL   HCT 48.8 40.0 - 52.0 %   MCV 87.4 80.0 - 100.0 fL   MCH 30.2 26.0 - 34.0 pg   MCHC 34.5 32.0 - 36.0 g/dL   RDW 13.5 11.5 - 14.5 %   Platelets 393 150 - 440 K/uL  Urinalysis, Complete w Microscopic     Status: Abnormal   Collection Time: 03/19/17  9:27 PM  Result Value Ref Range   Color, Urine YELLOW (A) YELLOW   APPearance CLEAR (A) CLEAR   Specific Gravity, Urine 1.016 1.005 - 1.030   pH 5.0 5.0 - 8.0   Glucose, UA NEGATIVE NEGATIVE mg/dL   Hgb urine dipstick NEGATIVE NEGATIVE   Bilirubin Urine NEGATIVE NEGATIVE   Ketones, ur 5 (A) NEGATIVE mg/dL   Protein, ur NEGATIVE NEGATIVE mg/dL   Nitrite NEGATIVE NEGATIVE   Leukocytes, UA NEGATIVE NEGATIVE   RBC / HPF NONE SEEN 0 - 5 RBC/hpf   WBC, UA 0-5 0 - 5 WBC/hpf   Bacteria, UA NONE SEEN NONE SEEN   Squamous Epithelial / LPF 0-5  (A) NONE SEEN   Mucus PRESENT    Hyaline Casts, UA PRESENT   COMPLETE METABOLIC PANEL WITH GFR     Status: Abnormal   Collection Time: 03/23/17  9:51 AM  Result Value Ref Range   Sodium 139 135 - 146 mmol/L   Potassium 4.2 3.5 - 5.3 mmol/L   Chloride 101 98 - 110 mmol/L   CO2 26 20 - 31 mmol/L   Glucose, Bld 198 (H) 65 - 99 mg/dL   BUN 14 7 - 25 mg/dL   Creat 0.92 0.70 - 1.33 mg/dL    Comment:   For patients > or = 56 years of age: The upper reference limit for Creatinine is approximately 13% higher for people identified as African-American.      Total Bilirubin 0.3 0.2 - 1.2 mg/dL   Alkaline Phosphatase 106 40 - 115 U/L   AST 21 10 - 35 U/L   ALT 34 9 - 46 U/L   Total Protein 6.7 6.1 - 8.1 g/dL   Albumin 4.3 3.6 - 5.1 g/dL   Calcium 9.4 8.6 - 10.3 mg/dL   GFR, Est African American >89 >=60 mL/min   GFR, Est Non African American >89 >=60 mL/min  Lipase, blood     Status: None   Collection Time: 03/30/17  2:24 PM  Result Value Ref  Range   Lipase 38 11 - 51 U/L  Comprehensive metabolic panel     Status: Abnormal   Collection Time: 03/30/17  2:24 PM  Result Value Ref Range   Sodium 138 135 - 145 mmol/L   Potassium 3.8 3.5 - 5.1 mmol/L   Chloride 103 101 - 111 mmol/L   CO2 28 22 - 32 mmol/L   Glucose, Bld 184 (H) 65 - 99 mg/dL   BUN 14 6 - 20 mg/dL   Creatinine, Ser 1.03 0.61 - 1.24 mg/dL   Calcium 8.8 (L) 8.9 - 10.3 mg/dL   Total Protein 6.9 6.5 - 8.1 g/dL   Albumin 3.9 3.5 - 5.0 g/dL   AST 19 15 - 41 U/L   ALT 7 (L) 17 - 63 U/L   Alkaline Phosphatase 80 38 - 126 U/L   Total Bilirubin 0.5 0.3 - 1.2 mg/dL   GFR calc non Af Amer >60 >60 mL/min   GFR calc Af Amer >60 >60 mL/min    Comment: (NOTE) The eGFR has been calculated using the CKD EPI equation. This calculation has not been validated in all clinical situations. eGFR's persistently <60 mL/min signify possible Chronic Kidney Disease.    Anion gap 7 5 - 15  CBC     Status: Abnormal   Collection Time: 03/30/17   2:24 PM  Result Value Ref Range   WBC 12.8 (H) 3.8 - 10.6 K/uL   RBC 4.47 4.40 - 5.90 MIL/uL   Hemoglobin 13.4 13.0 - 18.0 g/dL   HCT 38.9 (L) 40.0 - 52.0 %   MCV 87.0 80.0 - 100.0 fL   MCH 30.0 26.0 - 34.0 pg   MCHC 34.5 32.0 - 36.0 g/dL   RDW 13.7 11.5 - 14.5 %   Platelets 246 150 - 440 K/uL  POCT HgB A1C     Status: Abnormal   Collection Time: 04/05/17  1:36 PM  Result Value Ref Range   Hemoglobin A1C 9.1      Assessment & Plan  1. Acute non-recurrent frontal sinusitis Initial symptoms of allergy were not responsive to antihistamine and Flonase, now progressed to sinusitis, started on amoxicillin-clavulanate for 10 days. - amoxicillin-clavulanate (AUGMENTIN) 875-125 MG tablet; Take 1 tablet by mouth 2 (two) times daily.  Dispense: 20 tablet; Refill: 0   Marshel Golubski [facility] 06/17/2017 3:44 PM

## 2017-07-02 DIAGNOSIS — E1165 Type 2 diabetes mellitus with hyperglycemia: Secondary | ICD-10-CM | POA: Diagnosis not present

## 2017-07-02 DIAGNOSIS — Z794 Long term (current) use of insulin: Secondary | ICD-10-CM | POA: Diagnosis not present

## 2017-07-02 DIAGNOSIS — E7800 Pure hypercholesterolemia, unspecified: Secondary | ICD-10-CM | POA: Diagnosis not present

## 2017-07-06 ENCOUNTER — Ambulatory Visit (INDEPENDENT_AMBULATORY_CARE_PROVIDER_SITE_OTHER): Payer: PPO | Admitting: Family Medicine

## 2017-07-06 ENCOUNTER — Encounter: Payer: Self-pay | Admitting: Family Medicine

## 2017-07-06 VITALS — BP 138/84 | HR 100 | Temp 98.1°F | Resp 16 | Ht 71.0 in | Wt 232.1 lb

## 2017-07-06 DIAGNOSIS — I10 Essential (primary) hypertension: Secondary | ICD-10-CM | POA: Diagnosis not present

## 2017-07-06 DIAGNOSIS — E7800 Pure hypercholesterolemia, unspecified: Secondary | ICD-10-CM | POA: Diagnosis not present

## 2017-07-06 DIAGNOSIS — E1165 Type 2 diabetes mellitus with hyperglycemia: Secondary | ICD-10-CM | POA: Diagnosis not present

## 2017-07-06 DIAGNOSIS — Z23 Encounter for immunization: Secondary | ICD-10-CM

## 2017-07-06 LAB — COMPLETE METABOLIC PANEL WITH GFR
AG Ratio: 2 (calc) (ref 1.0–2.5)
ALKALINE PHOSPHATASE (APISO): 60 U/L (ref 40–115)
ALT: 22 U/L (ref 9–46)
AST: 14 U/L (ref 10–35)
Albumin: 4.3 g/dL (ref 3.6–5.1)
BILIRUBIN TOTAL: 0.3 mg/dL (ref 0.2–1.2)
BUN: 13 mg/dL (ref 7–25)
CO2: 31 mmol/L (ref 20–32)
CREATININE: 1 mg/dL (ref 0.70–1.33)
Calcium: 9.6 mg/dL (ref 8.6–10.3)
Chloride: 102 mmol/L (ref 98–110)
GFR, Est African American: 98 mL/min/{1.73_m2} (ref >=60)
GFR, Est Non African American: 84 mL/min/{1.73_m2} (ref >=60)
GLOBULIN: 2.2 g/dL (ref 1.9–3.7)
GLUCOSE: 164 mg/dL — AB (ref 65–99)
Potassium: 4.9 mmol/L (ref 3.5–5.3)
SODIUM: 141 mmol/L (ref 135–146)
Total Protein: 6.5 g/dL (ref 6.1–8.1)

## 2017-07-06 LAB — LIPID PANEL
Cholesterol: 164 mg/dL (ref <200)
HDL: 44 mg/dL (ref >40)
LDL CHOLESTEROL (CALC): 86 mg/dL
NON-HDL CHOLESTEROL (CALC): 120 mg/dL (ref <130)
TRIGLYCERIDES: 261 mg/dL — AB (ref <150)
Total CHOL/HDL Ratio: 3.7 (calc) (ref <5.0)

## 2017-07-06 LAB — POCT GLYCOSYLATED HEMOGLOBIN (HGB A1C): HEMOGLOBIN A1C: 7.8

## 2017-07-06 LAB — MICROALBUMIN / CREATININE URINE RATIO: CREATININE, URINE: 54 mg/dL (ref 20–320)

## 2017-07-06 LAB — GLUCOSE, POCT (MANUAL RESULT ENTRY): POC Glucose: 298 mg/dl — AB (ref 70–99)

## 2017-07-06 MED ORDER — QUINAPRIL-HYDROCHLOROTHIAZIDE 20-12.5 MG PO TABS: 2.0000 | ORAL_TABLET | Freq: Every day | ORAL | 0 refills | Status: DC

## 2017-07-06 MED ORDER — GLIPIZIDE 5 MG PO TABS: 5.0000 mg | ORAL_TABLET | Freq: Every day | ORAL | 0 refills | Status: DC

## 2017-07-06 MED ORDER — SIMVASTATIN 40 MG PO TABS: 40.0000 mg | ORAL_TABLET | Freq: Every day | ORAL | 0 refills | Status: DC

## 2017-07-06 MED ORDER — INSULIN DEGLUDEC-LIRAGLUTIDE 100-3.6 UNIT-MG/ML ~~LOC~~ SOPN
16.0000 [IU] | PEN_INJECTOR | Freq: Every day | SUBCUTANEOUS | 2 refills | Status: DC
Start: 2017-07-06 — End: 2017-08-31

## 2017-07-06 MED ORDER — METFORMIN HCL 1000 MG PO TABS: 1000.0000 mg | ORAL_TABLET | Freq: Two times a day (BID) | ORAL | 0 refills | Status: DC

## 2017-07-06 NOTE — Progress Notes (Signed)
Name: Bryan Flowers   MRN: [id number]    DOB: [birth year]   Date:07/06/2017       Progress Note  Subjective  Chief Complaint  Chief Complaint  Patient presents with  . Diabetes  . Hypertension    Diabetes  He presents for his follow-up diabetic visit. He has type 2 diabetes mellitus. His disease course has been stable. There are no hypoglycemic associated symptoms. Pertinent negatives for hypoglycemia include no headaches. Pertinent negatives for diabetes include no blurred vision, no chest pain, no fatigue, no foot paresthesias, no polydipsia and no polyuria. Pertinent negatives for diabetic complications include no CVA. Risk factors for coronary artery disease include dyslipidemia. Current diabetic treatment includes intensive insulin program and oral agent (dual therapy). He is following a diabetic and generally healthy diet. He participates in exercise daily. He monitors blood glucose at home 1-2 x per day. His breakfast blood glucose range is generally 130-140 mg/dl. An ACE inhibitor/angiotensin II receptor blocker is being taken.  Hypertension  This is a chronic problem. The problem is unchanged. The problem is controlled. Pertinent negatives include no blurred vision, chest pain, headaches, orthopnea, palpitations or shortness of breath. Past treatments include ACE inhibitors and diuretics. There is no history of kidney disease, CAD/MI or CVA.  Hyperlipidemia  This is a chronic problem. The problem is uncontrolled. Recent lipid tests were reviewed and are high (elevated triglycerides.). Pertinent negatives include no chest pain or shortness of breath.     Past Medical History:  Diagnosis Date  . Anemia    iron deficiency  . Bipolar 1 disorder, manic, moderate ([place])   . Depression   . Diabetes mellitus without complication ([place])   . GERD (gastroesophageal reflux disease)   . Hyperlipidemia   . Hypertension   . Insomnia   . Parkinson disease ([place])   . Peptic ulcer disease with  hemorrhage     Past Surgical History:  Procedure Laterality Date  . ESOPHAGOGASTRODUODENOSCOPY Left 03/16/2016   Procedure: ESOPHAGOGASTRODUODENOSCOPY (EGD);  Surgeon: Manus Gunning, MD;  Location: [facility];  Service: Gastroenterology;  Laterality: Left;  . FOOT FRACTURE SURGERY Right     Family History  Problem Relation Age of Onset  . Cancer Mother   . Heart disease Father     Social History   Social History  . Marital status: Married    Spouse name: N/A  . Number of children: N/A  . Years of education: N/A   Occupational History  . Not on file.   Social History Main Topics  . Smoking status: Never Smoker  . Smokeless tobacco: Never Used  . Alcohol use No  . Drug use: No  . Sexual activity: Yes    Partners: Female   Other Topics Concern  . Not on file   Social History Narrative  . No narrative on file     Current Outpatient Prescriptions:  .  acetaminophen (TYLENOL) 500 MG tablet, Take 1,000 mg by mouth every 6 (six) hours as needed (pain)., Disp: , Rfl:  .  b complex vitamins capsule, Take 1 capsule by mouth daily., Disp: , Rfl:  .  carbidopa-levodopa (SINEMET CR) 50-200 MG per tablet, Take 1 tablet by mouth 3 (three) times daily. , Disp: , Rfl:  .  cetirizine (ZYRTEC) 10 MG tablet, Take 1 tablet (10 mg total) by mouth daily., Disp: 90 tablet, Rfl: 1 .  DULoxetine (CYMBALTA) 60 MG capsule, Take 120 mg by mouth at bedtime. , Disp: , Rfl:  .  famotidine (PEPCID) 40 MG tablet, Take 1 tablet (40 mg total) by mouth every evening., Disp: 30 tablet, Rfl: 1 .  fluticasone (FLONASE) 50 MCG/ACT nasal spray, Place 2 sprays into both nostrils daily., Disp: 16 g, Rfl: 2 .  glipiZIDE (GLUCOTROL) 5 MG tablet, Take 1 tablet (5 mg total) by mouth daily before breakfast., Disp: 90 tablet, Rfl: 0 .  Insulin Degludec-Liraglutide (XULTOPHY) 100-3.6 UNIT-MG/ML SOPN, Inject 16 Units into the skin at bedtime., Disp: 15 mL, Rfl: 2 .  lamoTRIgine (LAMICTAL) 200 MG tablet,  Take 200 mg by mouth at bedtime. , Disp: , Rfl:  .  LORazepam (ATIVAN) 0.5 MG tablet, Take 0.5 mg by mouth 3 (three) times daily as needed for anxiety. , Disp: , Rfl:  .  LYRICA 50 MG capsule, tk 1 capsule at bedtime, Disp: 30 capsule, Rfl: 2 .  metFORMIN (GLUCOPHAGE) 1000 MG tablet, Take 1 tablet (1,000 mg total) by mouth 2 (two) times daily with a meal., Disp: 180 tablet, Rfl: 0 .  ondansetron (ZOFRAN) 4 MG tablet, Take 1 tablet (4 mg total) by mouth every 8 (eight) hours as needed for nausea or vomiting., Disp: 20 tablet, Rfl: 0 .  pantoprazole (PROTONIX) 40 MG tablet, Take 1 tablet (40 mg total) by mouth daily., Disp: 30 tablet, Rfl: 0 .  propranolol ER (INDERAL LA) 60 MG 24 hr capsule, Take 60 mg by mouth at bedtime. , Disp: , Rfl:  .  QUEtiapine (SEROQUEL XR) 400 MG 24 hr tablet, TK 1 T PO QD AT 8PM ONE HOUR BEFORE OR AFTER A MEAL, Disp: , Rfl: 2 .  quinapril-hydrochlorothiazide (ACCURETIC) 20-12.5 MG tablet, Take 2 tablets by mouth daily., Disp: 180 tablet, Rfl: 0 .  rOPINIRole (REQUIP) 1 MG tablet, Take 1 mg by mouth at bedtime., Disp: , Rfl:  .  simvastatin (ZOCOR) 40 MG tablet, Take 1 tablet (40 mg total) by mouth at bedtime., Disp: 90 tablet, Rfl: 0 .  sucralfate (CARAFATE) 1 g tablet, Take 1 tablet (1 g total) by mouth 4 (four) times daily., Disp: 60 tablet, Rfl: 0 .  amoxicillin-clavulanate (AUGMENTIN) 875-125 MG tablet, Take 1 tablet by mouth 2 (two) times daily. (Patient not taking: Reported on 07/06/2017), Disp: 20 tablet, Rfl: 0  No Known Allergies   Review of Systems  Constitutional: Negative for fatigue.  Eyes: Negative for blurred vision.  Respiratory: Negative for shortness of breath.   Cardiovascular: Negative for chest pain, palpitations and orthopnea.  Neurological: Negative for headaches.  Endo/Heme/Allergies: Negative for polydipsia.      Objective  Vitals:   07/06/17 1324  BP: 138/84  Pulse: 100  Resp: 16  Temp: 98.1 F (36.7 C)  TempSrc: Oral  SpO2:  96%  Weight: 232 lb 1.6 oz (105.3 kg)  Height: 5\' 11"  (1.803 m)    Physical Exam  Constitutional: He is oriented to person, place, and time and well-developed, well-nourished, and in no distress.  HENT:  Head: Normocephalic and atraumatic.  Cardiovascular: Normal rate, regular rhythm and normal heart sounds.   No murmur heard. Pulmonary/Chest: Effort normal and breath sounds normal. He has no wheezes.  Abdominal: Soft. Bowel sounds are normal. There is no tenderness.  Musculoskeletal: He exhibits no edema.  Neurological: He is alert and oriented to person, place, and time.  Psychiatric: Mood, memory, affect and judgment normal.  Nursing note and vitals reviewed.      Recent Results (from the past 2160 hour(s))  Urine Microalbumin w/creat. ratio     Status: None  Collection Time: 07/02/17  8:11 AM  Result Value Ref Range   Creatinine, Urine 54 20 - 320 mg/dL   Microalb, Ur <0.2 mg/dL    Comment: Reference Range Not established    Microalb Creat Ratio NOTE <30 mcg/mg creat    Comment: The microalbumin value is less than 0.2 mg/dL therefore we are unable to calculate excretion and/or creatinine ratio. . . The ADA defines abnormalities in albumin excretion as follows: Marland Kitchen Category         Result (mcg/mg creatinine) . Normal                    <30 Microalbuminuria         30-299  Clinical albuminuria   > OR = 300 . The ADA recommends that at least two of three specimens collected within a 3-6 month period be abnormal before considering a patient to be within a diagnostic category.   Lipid panel     Status: Abnormal   Collection Time: 07/02/17  8:11 AM  Result Value Ref Range   Cholesterol 164 <200 mg/dL   HDL 44 >40 mg/dL   Triglycerides 261 (H) <150 mg/dL   LDL Cholesterol (Calc) 86 mg/dL (calc)    Comment: Reference range: <100 . Desirable range <100 mg/dL for primary prevention;   <70 mg/dL for patients with CHD or diabetic patients  with > or = 2 CHD risk  factors. Marland Kitchen LDL-C is now calculated using the Martin-Hopkins  calculation, which is a validated novel method providing  better accuracy than the Friedewald equation in the  estimation of LDL-C.  Cresenciano Genre et al. Annamaria Helling. 4782;956(21): 2061-2068  (http://education.[facility].com/faq/FAQ164)    Total CHOL/HDL Ratio 3.7 <5.0 (calc)   Non-HDL Cholesterol (Calc) 120 <130 mg/dL (calc)    Comment: For patients with diabetes plus 1 major ASCVD risk  factor, treating to a non-HDL-C goal of <100 mg/dL  (LDL-C of <70 mg/dL) is considered a therapeutic  option.   COMPLETE METABOLIC PANEL WITH GFR     Status: Abnormal   Collection Time: 07/02/17  8:11 AM  Result Value Ref Range   Glucose, Bld 164 (H) 65 - 99 mg/dL    Comment: .            Fasting reference interval . For someone without known diabetes, a glucose value >125 mg/dL indicates that they may have diabetes and this should be confirmed with a follow-up test. .    BUN 13 7 - 25 mg/dL   Creat 1.00 0.70 - 1.33 mg/dL    Comment: For patients >37 years of age, the reference limit for Creatinine is approximately 13% higher for people identified as African-American. .    GFR, Est Non African American 84 > OR = 60 mL/min/1.86m2   GFR, Est African American 98 > OR = 60 mL/min/1.51m2   BUN/Creatinine Ratio NOT APPLICABLE 6 - 22 (calc)   Sodium 141 135 - 146 mmol/L   Potassium 4.9 3.5 - 5.3 mmol/L   Chloride 102 98 - 110 mmol/L   CO2 31 20 - 32 mmol/L   Calcium 9.6 8.6 - 10.3 mg/dL   Total Protein 6.5 6.1 - 8.1 g/dL   Albumin 4.3 3.6 - 5.1 g/dL   Globulin 2.2 1.9 - 3.7 g/dL (calc)   AG Ratio 2.0 1.0 - 2.5 (calc)   Total Bilirubin 0.3 0.2 - 1.2 mg/dL   Alkaline phosphatase (APISO) 60 40 - 115 U/L   AST 14 10 - 35 U/L  ALT 22 9 - 46 U/L  POCT Glucose (CBG)     Status: Abnormal   Collection Time: 07/06/17  1:32 PM  Result Value Ref Range   POC Glucose 298 (A) 70 - 99 mg/dl  POCT HgB A1C     Status: None   Collection Time:  07/06/17  1:38 PM  Result Value Ref Range   Hemoglobin A1C 7.8      Assessment & Plan  1. Uncontrolled type 2 diabetes mellitus with hyperglycemia (HCC) A1c is 7.8% which is much improved from 9.1%, he is compliant with dietary and lifestyle changes, injecting insulin and taking metformin regularly, no change in treatment - POCT HgB A1C - POCT Glucose (CBG) - Urine Microalbumin w/creat. ratio - glipiZIDE (GLUCOTROL) 5 MG tablet; Take 1 tablet (5 mg total) by mouth daily before breakfast.  Dispense: 90 tablet; Refill: 0 - Insulin Degludec-Liraglutide (XULTOPHY) 100-3.6 UNIT-MG/ML SOPN; Inject 16 Units into the skin at bedtime.  Dispense: 15 mL; Refill: 2 - metFORMIN (GLUCOPHAGE) 1000 MG tablet; Take 1 tablet (1,000 mg total) by mouth 2 (two) times daily with a meal.  Dispense: 180 tablet; Refill: 0  2. Need for influenza vaccination  - Flu Vaccine QUAD 6+ mos PF IM (Fluarix Quad PF)  3. Essential hypertension Stable on present antihypertensive treatment - quinapril-hydrochlorothiazide (ACCURETIC) 20-12.5 MG tablet; Take 2 tablets by mouth daily.  Dispense: 180 tablet; Refill: 0  4. Pure hypercholesterolemia  - Lipid panel - simvastatin (ZOCOR) 40 MG tablet; Take 1 tablet (40 mg total) by mouth at bedtime.  Dispense: 90 tablet; Refill: 0    Dorianna Mckiver [facility] 07/06/2017 1:42 PM

## 2017-07-07 ENCOUNTER — Telehealth: Payer: Self-pay

## 2017-07-07 NOTE — Telephone Encounter (Signed)
Called pt no answer. LM informing pt of information below.

## 2017-07-07 NOTE — Telephone Encounter (Signed)
-----   Message from Roselee Nova, MD sent at 07/05/2017  6:08 PM EDT ----- Fasting lipid panel: Elevated triglycerides at 261 mg/dL, normal total cholesterol, HDL and LDL cholesterol. Patient should continue on statin and recheck in 3-4 months CMP: Elevated glucose at 164 mg/dL, normal electrolytes, kidney function and liver enzymes

## 2017-07-12 ENCOUNTER — Other Ambulatory Visit: Payer: Self-pay | Admitting: Family Medicine

## 2017-07-12 DIAGNOSIS — E1165 Type 2 diabetes mellitus with hyperglycemia: Secondary | ICD-10-CM

## 2017-07-12 DIAGNOSIS — F319 Bipolar disorder, unspecified: Secondary | ICD-10-CM | POA: Diagnosis not present

## 2017-07-12 DIAGNOSIS — E6609 Other obesity due to excess calories: Secondary | ICD-10-CM | POA: Diagnosis not present

## 2017-07-12 DIAGNOSIS — G2581 Restless legs syndrome: Secondary | ICD-10-CM | POA: Diagnosis not present

## 2017-07-12 DIAGNOSIS — G2111 Neuroleptic induced parkinsonism: Secondary | ICD-10-CM | POA: Diagnosis not present

## 2017-07-12 DIAGNOSIS — Z6832 Body mass index (BMI) 32.0-32.9, adult: Secondary | ICD-10-CM | POA: Diagnosis not present

## 2017-07-12 DIAGNOSIS — G3184 Mild cognitive impairment, so stated: Secondary | ICD-10-CM | POA: Diagnosis not present

## 2017-07-13 ENCOUNTER — Ambulatory Visit: Payer: Self-pay | Admitting: Podiatry

## 2017-07-16 ENCOUNTER — Encounter (INDEPENDENT_AMBULATORY_CARE_PROVIDER_SITE_OTHER): Payer: Self-pay | Admitting: Vascular Surgery

## 2017-07-16 ENCOUNTER — Ambulatory Visit (INDEPENDENT_AMBULATORY_CARE_PROVIDER_SITE_OTHER): Payer: PPO | Admitting: Vascular Surgery

## 2017-07-16 VITALS — BP 125/77 | HR 106 | Ht 71.0 in | Wt 231.0 lb

## 2017-07-16 DIAGNOSIS — E7800 Pure hypercholesterolemia, unspecified: Secondary | ICD-10-CM

## 2017-07-16 DIAGNOSIS — I10 Essential (primary) hypertension: Secondary | ICD-10-CM

## 2017-07-16 DIAGNOSIS — I739 Peripheral vascular disease, unspecified: Secondary | ICD-10-CM | POA: Diagnosis not present

## 2017-07-16 DIAGNOSIS — E1169 Type 2 diabetes mellitus with other specified complication: Secondary | ICD-10-CM

## 2017-07-16 HISTORY — DX: Peripheral vascular disease, unspecified: I73.9

## 2017-07-16 NOTE — Assessment & Plan Note (Signed)
blood pressure control important in reducing the progression of atherosclerotic disease. On appropriate oral medications.  

## 2017-07-16 NOTE — Assessment & Plan Note (Signed)
lipid control important in reducing the progression of atherosclerotic disease. Continue statin therapy  

## 2017-07-16 NOTE — Assessment & Plan Note (Signed)
The patient describes symptoms that are consistent with atherosclerosis with claudication.  He has multiple atherosclerotic risk factors.  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  Neurogenic claudication can mimic the symptoms.  He does have a history of back disease.  I have recommended noninvasive studies to be performed in the near future at his convenience.  I have discussed the natural history and pathophysiology of both peripheral arterial disease and other forms of claudication.  I discussed that he does not have critical lower limb threatening situations, but this can be a progressive and lifestyle limiting situation.  I have discussed that he is on appropriate medical therapy with aspirin and a statin agent.  He should increase his activity and exercise as tolerated.  I will see him back following his noninvasive studies to discuss the results and determine further treatment options.

## 2017-07-16 NOTE — Progress Notes (Signed)
Patient ID: Bryan Flowers, male   DOB: 04-24-1961, 56 y.o.   MRN: [id number]  Chief Complaint  Patient presents with  . New Patient (Initial Visit)    Uncontrolled hypertension    HPI Bryan Flowers is a 56 y.o. male.  I am asked to see the patient by Dr. [name] for evaluation of leg pain.  The patient reports progressive problems with cramping and pain in his legs over months to years.  There is no clear inciting event or causative factor that started the pain.  The patient reports the right leg to be the more severely affected of the legs.  Patient has cramping and pain in the legs with only about 20 yards of walking.  Walking up stairs or inclines makes this pain come on quicker and more intensely.  Stopping to rest and stretching the leg seems to help.  He has a previous history of back problems and has taken physical therapy for his spine disease.  His leg pain had been previously attributed to this, but given his multiple atherosclerotic risk factors his primary care physician recommended he be evaluated for peripheral arterial disease.  He does not have ulceration or infection he does not describe symptoms worrisome for ischemic rest pain.   Past Medical History:  Diagnosis Date  . Anemia    iron deficiency  . Bipolar 1 disorder, manic, moderate ([place])   . Depression   . Diabetes mellitus without complication ([place])   . GERD (gastroesophageal reflux disease)   . Hyperlipidemia   . Hypertension   . Insomnia   . Parkinson disease ([place])   . Peptic ulcer disease with hemorrhage     Past Surgical History:  Procedure Laterality Date  . ESOPHAGOGASTRODUODENOSCOPY Left 03/16/2016   Procedure: ESOPHAGOGASTRODUODENOSCOPY (EGD);  Surgeon: Manus Gunning, MD;  Location: [facility];  Service: Gastroenterology;  Laterality: Left;  . FOOT FRACTURE SURGERY Right     Family History  Problem Relation Age of Onset  . Cancer Mother   . Heart disease Father   No bleeding  disorders or clotting disorders  Social History Social History  Substance Use Topics  . Smoking status: Never Smoker  . Smokeless tobacco: Never Used  . Alcohol use No  No IVDU   No Known Allergies  Current Outpatient Prescriptions  Medication Sig Dispense Refill  . acetaminophen (TYLENOL) 500 MG tablet Take 1,000 mg by mouth every 6 (six) hours as needed (pain).    . Armodafinil 250 MG tablet Take by mouth.    Marland Kitchen aspirin (ASPIRIN 81) 81 MG chewable tablet Chew by mouth.    Marland Kitchen b complex vitamins capsule Take 1 capsule by mouth daily.    . carbidopa-levodopa (SINEMET CR) 50-200 MG per tablet Take 1 tablet by mouth 3 (three) times daily.     . cetirizine (ZYRTEC) 10 MG tablet Take 1 tablet (10 mg total) by mouth daily. 90 tablet 1  . DULoxetine (CYMBALTA) 60 MG capsule Take 120 mg by mouth at bedtime.     . famotidine (PEPCID) 40 MG tablet Take 1 tablet (40 mg total) by mouth every evening. 30 tablet 1  . fluticasone (FLONASE) 50 MCG/ACT nasal spray Place 2 sprays into both nostrils daily. 16 g 2  . glipiZIDE (GLUCOTROL) 5 MG tablet Take 1 tablet (5 mg total) by mouth daily before breakfast. 90 tablet 0  . Insulin Degludec-Liraglutide (XULTOPHY) 100-3.6 UNIT-MG/ML SOPN Inject 16 Units into the skin at bedtime. 15 mL 2  .  lamoTRIgine (LAMICTAL) 200 MG tablet Take 200 mg by mouth at bedtime.     Marland Kitchen LORazepam (ATIVAN) 0.5 MG tablet Take 0.5 mg by mouth 3 (three) times daily as needed for anxiety.     Marland Kitchen LYRICA 50 MG capsule tk 1 capsule at bedtime 30 capsule 2  . metFORMIN (GLUCOPHAGE) 1000 MG tablet Take 1 tablet (1,000 mg total) by mouth 2 (two) times daily with a meal. 180 tablet 0  . ondansetron (ZOFRAN) 4 MG tablet Take 1 tablet (4 mg total) by mouth every 8 (eight) hours as needed for nausea or vomiting. 20 tablet 0  . pantoprazole (PROTONIX) 40 MG tablet Take 1 tablet (40 mg total) by mouth daily. 30 tablet 0  . propranolol ER (INDERAL LA) 60 MG 24 hr capsule Take 60 mg by mouth at  bedtime.     Marland Kitchen QUEtiapine (SEROQUEL XR) 400 MG 24 hr tablet TK 1 T PO QD AT 8PM ONE HOUR BEFORE OR AFTER A MEAL  2  . quinapril-hydrochlorothiazide (ACCURETIC) 20-12.5 MG tablet Take 2 tablets by mouth daily. 180 tablet 0  . rOPINIRole (REQUIP) 1 MG tablet Take 1 mg by mouth at bedtime.    . simvastatin (ZOCOR) 40 MG tablet Take 1 tablet (40 mg total) by mouth at bedtime. 90 tablet 0  . sucralfate (CARAFATE) 1 g tablet Take 1 tablet (1 g total) by mouth 4 (four) times daily. 60 tablet 0   No current facility-administered medications for this visit.       REVIEW OF SYSTEMS (Negative unless checked)  Constitutional: [] Weight loss  [] Fever  [] Chills Cardiac: [] Chest pain   [] Chest pressure   [] Palpitations   [] Shortness of breath when laying flat   [] Shortness of breath at rest   [x] Shortness of breath with exertion. Vascular:  [x] Pain in legs with walking   [] Pain in legs at rest   [] Pain in legs when laying flat   [x] Claudication   [x] Pain in feet when walking  [] Pain in feet at rest  [] Pain in feet when laying flat   [] History of DVT   [] Phlebitis   [] Swelling in legs   [] Varicose veins   [] Non-healing ulcers Pulmonary:   [] Uses home oxygen   [] Productive cough   [] Hemoptysis   [] Wheeze  [] COPD   [] Asthma Neurologic:  [] Dizziness  [] Blackouts   [] Seizures   [] History of stroke   [] History of TIA  [] Aphasia   [] Temporary blindness   [] Dysphagia   [] Weakness or numbness in arms   [] Weakness or numbness in legs Musculoskeletal:  [x] Arthritis   [] Joint swelling   [] Joint pain   [] Low back pain Hematologic:  [] Easy bruising  [] Easy bleeding   [] Hypercoagulable state   [x] Anemic  [] Hepatitis Gastrointestinal:  [] Blood in stool   [] Vomiting blood  [x] Gastroesophageal reflux/heartburn   [] Abdominal pain Genitourinary:  [] Chronic kidney disease   [] Difficult urination  [] Frequent urination  [] Burning with urination   [] Hematuria Skin:  [] Rashes   [] Ulcers   [] Wounds Psychological:  [x] History of  anxiety   [x]  History of major depression.    Physical Exam BP 125/77 (BP Location: Left Arm)   Pulse (!) 106   Ht 5\' 11"  (1.803 m)   Wt 104.8 kg (231 lb)   BMI 32.22 kg/m  Gen:  WD/WN, NAD Head: [place]/AT, No temporalis wasting Ear/Nose/Throat: Hearing grossly intact, nares w/o erythema or drainage, oropharynx w/o Erythema/Exudate Eyes: Conjunctiva clear, sclera non-icteric  Neck: trachea midline.  No JVD.  Pulmonary:  Good air movement, clear  to auscultation bilaterally.  Cardiac: RRR, systolic murmur which radiates into the neck Vascular:  Vessel Right Left  Radial Palpable Palpable                      Popliteal  1+ palpable  1+ palpable  PT  not palpable  1+ palpable  DP  1+ palpable  1+ palpable   Gastrointestinal: soft, non-tender/non-distended. Musculoskeletal: M/S 5/5 throughout.  Extremities without ischemic changes.  No deformity or atrophy. 1+  BLE edema. Neurologic: Sensation grossly intact in extremities.  Symmetrical.  Speech is fluent. Motor exam as listed above. Psychiatric: Judgment intact, Mood & affect appropriate for pt's clinical situation. Dermatologic: No rashes or ulcers noted.  No cellulitis or open wounds.    Radiology No results found.  Labs Recent Results (from the past 2160 hour(s))  Urine Microalbumin w/creat. ratio     Status: None   Collection Time: 07/02/17  8:11 AM  Result Value Ref Range   Creatinine, Urine 54 20 - 320 mg/dL   Microalb, Ur <0.2 mg/dL    Comment: Reference Range Not established    Microalb Creat Ratio NOTE <30 mcg/mg creat    Comment: The microalbumin value is less than 0.2 mg/dL therefore we are unable to calculate excretion and/or creatinine ratio. . . The ADA defines abnormalities in albumin excretion as follows: Marland Kitchen Category         Result (mcg/mg creatinine) . Normal                    <30 Microalbuminuria         30-299  Clinical albuminuria   > OR = 300 . The ADA recommends that at least two of  three specimens collected within a 3-6 month period be abnormal before considering a patient to be within a diagnostic category.   Lipid panel     Status: Abnormal   Collection Time: 07/02/17  8:11 AM  Result Value Ref Range   Cholesterol 164 <200 mg/dL   HDL 44 >40 mg/dL   Triglycerides 261 (H) <150 mg/dL   LDL Cholesterol (Calc) 86 mg/dL (calc)    Comment: Reference range: <100 . Desirable range <100 mg/dL for primary prevention;   <70 mg/dL for patients with CHD or diabetic patients  with > or = 2 CHD risk factors. Marland Kitchen LDL-C is now calculated using the Martin-Hopkins  calculation, which is a validated novel method providing  better accuracy than the Friedewald equation in the  estimation of LDL-C.  Cresenciano Genre et al. Annamaria Helling. 0263;785(88): 2061-2068  (http://education.[employer].com/faq/FAQ164)    Total CHOL/HDL Ratio 3.7 <5.0 (calc)   Non-HDL Cholesterol (Calc) 120 <130 mg/dL (calc)    Comment: For patients with diabetes plus 1 major ASCVD risk  factor, treating to a non-HDL-C goal of <100 mg/dL  (LDL-C of <70 mg/dL) is considered a therapeutic  option.   COMPLETE METABOLIC PANEL WITH GFR     Status: Abnormal   Collection Time: 07/02/17  8:11 AM  Result Value Ref Range   Glucose, Bld 164 (H) 65 - 99 mg/dL    Comment: .            Fasting reference interval . For someone without known diabetes, a glucose value >125 mg/dL indicates that they may have diabetes and this should be confirmed with a follow-up test. .    BUN 13 7 - 25 mg/dL   Creat 1.00 0.70 - 1.33 mg/dL    Comment: For  patients >99 years of age, the reference limit for Creatinine is approximately 13% higher for people identified as African-American. .    GFR, Est Non African American 84 > OR = 60 mL/min/1.24m2   GFR, Est African American 98 > OR = 60 mL/min/1.37m2   BUN/Creatinine Ratio NOT APPLICABLE 6 - 22 (calc)   Sodium 141 135 - 146 mmol/L   Potassium 4.9 3.5 - 5.3 mmol/L   Chloride 102 98 -  110 mmol/L   CO2 31 20 - 32 mmol/L   Calcium 9.6 8.6 - 10.3 mg/dL   Total Protein 6.5 6.1 - 8.1 g/dL   Albumin 4.3 3.6 - 5.1 g/dL   Globulin 2.2 1.9 - 3.7 g/dL (calc)   AG Ratio 2.0 1.0 - 2.5 (calc)   Total Bilirubin 0.3 0.2 - 1.2 mg/dL   Alkaline phosphatase (APISO) 60 40 - 115 U/L   AST 14 10 - 35 U/L   ALT 22 9 - 46 U/L  POCT Glucose (CBG)     Status: Abnormal   Collection Time: 07/06/17  1:32 PM  Result Value Ref Range   POC Glucose 298 (A) 70 - 99 mg/dl  POCT HgB A1C     Status: None   Collection Time: 07/06/17  1:38 PM  Result Value Ref Range   Hemoglobin A1C 7.8     Assessment/Plan:  BP (high blood pressure) blood pressure control important in reducing the progression of atherosclerotic disease. On appropriate oral medications.   Type 2 diabetes mellitus (HCC) blood glucose control important in reducing the progression of atherosclerotic disease. Also, involved in wound healing. On appropriate medications.   HLD (hyperlipidemia) lipid control important in reducing the progression of atherosclerotic disease. Continue statin therapy   Claudication [facility]) The patient describes symptoms that are consistent with atherosclerosis with claudication.  He has multiple atherosclerotic risk factors.  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  Neurogenic claudication can mimic the symptoms.  He does have a history of back disease.  I have recommended noninvasive studies to be performed in the near future at his convenience.  I have discussed the natural history and pathophysiology of both peripheral arterial disease and other forms of claudication.  I discussed that he does not have critical lower limb threatening situations, but this can be a progressive and lifestyle limiting situation.  I have discussed that he is on appropriate medical therapy with aspirin and a statin agent.  He should increase  his activity and exercise as tolerated.  I will see him back following his noninvasive studies to discuss the results and determine further treatment options.      Leotis Pain 07/16/2017, 3:00 PM   This note was created with Dragon medical transcription system.  Any errors from dictation are unintentional.

## 2017-07-16 NOTE — Patient Instructions (Signed)

## 2017-07-16 NOTE — Assessment & Plan Note (Signed)
blood glucose control important in reducing the progression of atherosclerotic disease. Also, involved in wound healing. On appropriate medications.  

## 2017-07-19 DIAGNOSIS — G2581 Restless legs syndrome: Secondary | ICD-10-CM | POA: Diagnosis not present

## 2017-07-19 DIAGNOSIS — G4726 Circadian rhythm sleep disorder, shift work type: Secondary | ICD-10-CM | POA: Diagnosis not present

## 2017-07-19 DIAGNOSIS — F3181 Bipolar II disorder: Secondary | ICD-10-CM | POA: Diagnosis not present

## 2017-07-30 DIAGNOSIS — E119 Type 2 diabetes mellitus without complications: Secondary | ICD-10-CM | POA: Diagnosis not present

## 2017-07-30 DIAGNOSIS — E1159 Type 2 diabetes mellitus with other circulatory complications: Secondary | ICD-10-CM | POA: Diagnosis not present

## 2017-08-10 ENCOUNTER — Encounter: Payer: Self-pay | Admitting: Family Medicine

## 2017-08-10 ENCOUNTER — Ambulatory Visit: Payer: PPO | Admitting: Family Medicine

## 2017-08-16 DIAGNOSIS — G2581 Restless legs syndrome: Secondary | ICD-10-CM | POA: Diagnosis not present

## 2017-08-16 DIAGNOSIS — F3181 Bipolar II disorder: Secondary | ICD-10-CM | POA: Diagnosis not present

## 2017-08-16 DIAGNOSIS — G4726 Circadian rhythm sleep disorder, shift work type: Secondary | ICD-10-CM | POA: Diagnosis not present

## 2017-08-16 NOTE — Progress Notes (Signed)
This encounter was created in error - please disregard.

## 2017-08-25 ENCOUNTER — Ambulatory Visit (INDEPENDENT_AMBULATORY_CARE_PROVIDER_SITE_OTHER): Payer: PPO

## 2017-08-25 ENCOUNTER — Ambulatory Visit (INDEPENDENT_AMBULATORY_CARE_PROVIDER_SITE_OTHER): Payer: PPO | Admitting: Vascular Surgery

## 2017-08-25 ENCOUNTER — Encounter (INDEPENDENT_AMBULATORY_CARE_PROVIDER_SITE_OTHER): Payer: Self-pay

## 2017-08-25 ENCOUNTER — Encounter (INDEPENDENT_AMBULATORY_CARE_PROVIDER_SITE_OTHER): Payer: Self-pay | Admitting: Vascular Surgery

## 2017-08-25 VITALS — BP 143/83 | HR 104 | Resp 15 | Ht 71.0 in | Wt 232.0 lb

## 2017-08-25 DIAGNOSIS — E1165 Type 2 diabetes mellitus with hyperglycemia: Secondary | ICD-10-CM

## 2017-08-25 DIAGNOSIS — M79605 Pain in left leg: Secondary | ICD-10-CM | POA: Diagnosis not present

## 2017-08-25 DIAGNOSIS — E7800 Pure hypercholesterolemia, unspecified: Secondary | ICD-10-CM | POA: Diagnosis not present

## 2017-08-25 DIAGNOSIS — M79604 Pain in right leg: Secondary | ICD-10-CM

## 2017-08-25 DIAGNOSIS — I739 Peripheral vascular disease, unspecified: Secondary | ICD-10-CM

## 2017-08-25 NOTE — Progress Notes (Signed)
Subjective:    Patient ID: Bryan Flowers, male    DOB: [birth year], 56 y.o.   MRN: [id number] Chief Complaint  Patient presents with  . Follow-up    ABI follow up   Patient presents to review vascular studies.  The patient was last seen on July 16, 2017 for evaluation of bilateral lower extremity claudication.  The patient's symptoms remained stable.  He denies any rest pain or ulceration to the bilateral lower extremity.  The patient underwent a bilateral lower extremity ABI which was significant for no lower extremity arterial disease.  The patient was noted to have bilateral triphasic femoral, popliteal, posterior tibial and anterior tibial waveforms.  The patient denies any fever, nausea or vomiting.   Review of Systems  Constitutional: Negative.   HENT: Negative.   Eyes: Negative.   Respiratory: Negative.   Cardiovascular:       Bilateral lower extremity claudication  Gastrointestinal: Negative.   Endocrine: Negative.   Genitourinary: Negative.   Musculoskeletal: Negative.   Skin: Negative.   Allergic/Immunologic: Negative.   Neurological: Negative.   Hematological: Negative.   Psychiatric/Behavioral: Negative.       Objective:   Physical Exam  Constitutional: He is oriented to person, place, and time. He appears well-developed and well-nourished. No distress.  HENT:  Head: Normocephalic and atraumatic.  Eyes: Conjunctivae are normal. Pupils are equal, round, and reactive to light.  Neck: Normal range of motion.  Cardiovascular: Normal rate, regular rhythm, normal heart sounds and intact distal pulses.  Pulses:      Radial pulses are 2+ on the right side, and 2+ on the left side.       Dorsalis pedis pulses are 1+ on the right side, and 1+ on the left side.       Posterior tibial pulses are 0 on the right side, and 1+ on the left side.  Pulmonary/Chest: Effort normal and breath sounds normal.  Musculoskeletal: Normal range of motion. He exhibits no edema.    Neurological: He is alert and oriented to person, place, and time.  Skin: Skin is warm and dry. He is not diaphoretic.  Psychiatric: He has a normal mood and affect. His behavior is normal. Judgment and thought content normal.  Vitals reviewed.  BP (!) 143/83 (BP Location: Right Arm, Patient Position: Sitting)   Pulse (!) 104   Resp 15   Ht 5\' 11"  (1.803 m)   Wt 232 lb (105.2 kg)   BMI 32.36 kg/m   Past Medical History:  Diagnosis Date  . Anemia    iron deficiency  . Bipolar 1 disorder, manic, moderate ([place])   . Depression   . Diabetes mellitus without complication ([place])   . GERD (gastroesophageal reflux disease)   . Hyperlipidemia   . Hypertension   . Insomnia   . Parkinson disease ([place])   . Peptic ulcer disease with hemorrhage    Social History   Socioeconomic History  . Marital status: Married    Spouse name: Not on file  . Number of children: Not on file  . Years of education: Not on file  . Highest education level: Not on file  Social Needs  . Financial resource strain: Not on file  . Food insecurity - worry: Not on file  . Food insecurity - inability: Not on file  . Transportation needs - medical: Not on file  . Transportation needs - non-medical: Not on file  Occupational History  . Not on file  Tobacco Use  .  Smoking status: Never Smoker  . Smokeless tobacco: Never Used  Substance and Sexual Activity  . Alcohol use: No    Alcohol/week: 0.0 oz  . Drug use: No  . Sexual activity: Yes    Partners: Female  Other Topics Concern  . Not on file  Social History Narrative  . Not on file   Past Surgical History:  Procedure Laterality Date  . ESOPHAGOGASTRODUODENOSCOPY Left 03/16/2016   Procedure: ESOPHAGOGASTRODUODENOSCOPY (EGD);  Surgeon: Manus Gunning, MD;  Location: [facility];  Service: Gastroenterology;  Laterality: Left;  . FOOT FRACTURE SURGERY Right    Family History  Problem Relation Age of Onset  . Cancer Mother   . Heart disease  Father    No Known Allergies     Assessment & Plan:  Patient presents to review vascular studies.  The patient was last seen on July 16, 2017 for evaluation of bilateral lower extremity claudication.  The patient's symptoms remained stable.  He denies any rest pain or ulceration to the bilateral lower extremity.  The patient underwent a bilateral lower extremity ABI which was significant for no lower extremity arterial disease.  The patient was noted to have bilateral triphasic femoral, popliteal, posterior tibial and anterior tibial waveforms.  The patient denies any fever, nausea or vomiting.  1. Uncontrolled type 2 diabetes mellitus with hyperglycemia (HCC) - Stable Encouraged good control as its slows the progression of atherosclerotic disease  2. Pure hypercholesterolemia - Stable Encouraged good control as its slows the progression of atherosclerotic disease  3. Bilateral leg pain - Stable Patient with bilateral triphasic lower extremity arterial waveforms on ABI with no evidence of left lower arterial occlusive disease. The patient was encouraged to follow-up with his primary care physician to possibly rule out degenerative joint disease or osteoarthritis is a contributing factor to the patient's lower extremity pain. The patient can follow-up in our office as needed. The patient expresses his understanding.  Current Outpatient Medications on File Prior to Visit  Medication Sig Dispense Refill  . acetaminophen (TYLENOL) 500 MG tablet Take 1,000 mg by mouth every 6 (six) hours as needed (pain).    . Armodafinil 250 MG tablet Take by mouth.    Marland Kitchen aspirin (ASPIRIN 81) 81 MG chewable tablet Chew by mouth.    Marland Kitchen b complex vitamins capsule Take 1 capsule by mouth daily.    . carbidopa-levodopa (SINEMET CR) 50-200 MG per tablet Take 1 tablet by mouth 3 (three) times daily.     . cetirizine (ZYRTEC) 10 MG tablet Take 1 tablet (10 mg total) by mouth daily. 90 tablet 1  . DULoxetine  (CYMBALTA) 60 MG capsule Take 120 mg by mouth at bedtime.     . famotidine (PEPCID) 40 MG tablet Take 1 tablet (40 mg total) by mouth every evening. 30 tablet 1  . fluticasone (FLONASE) 50 MCG/ACT nasal spray Place 2 sprays into both nostrils daily. 16 g 2  . glipiZIDE (GLUCOTROL) 5 MG tablet Take 1 tablet (5 mg total) by mouth daily before breakfast. 90 tablet 0  . Insulin Degludec-Liraglutide (XULTOPHY) 100-3.6 UNIT-MG/ML SOPN Inject 16 Units into the skin at bedtime. 15 mL 2  . lamoTRIgine (LAMICTAL) 200 MG tablet Take 200 mg by mouth at bedtime.     Marland Kitchen LORazepam (ATIVAN) 0.5 MG tablet Take 0.5 mg by mouth 3 (three) times daily as needed for anxiety.     Marland Kitchen LYRICA 50 MG capsule tk 1 capsule at bedtime 30 capsule 2  . metFORMIN (GLUCOPHAGE)  1000 MG tablet Take 1 tablet (1,000 mg total) by mouth 2 (two) times daily with a meal. 180 tablet 0  . ondansetron (ZOFRAN) 4 MG tablet Take 1 tablet (4 mg total) by mouth every 8 (eight) hours as needed for nausea or vomiting. 20 tablet 0  . pantoprazole (PROTONIX) 40 MG tablet Take 1 tablet (40 mg total) by mouth daily. 30 tablet 0  . propranolol ER (INDERAL LA) 60 MG 24 hr capsule Take 60 mg by mouth at bedtime.     Marland Kitchen QUEtiapine (SEROQUEL XR) 400 MG 24 hr tablet TK 1 T PO QD AT 8PM ONE HOUR BEFORE OR AFTER A MEAL  2  . quinapril-hydrochlorothiazide (ACCURETIC) 20-12.5 MG tablet Take 2 tablets by mouth daily. 180 tablet 0  . rOPINIRole (REQUIP) 1 MG tablet Take 1 mg by mouth at bedtime.    . simvastatin (ZOCOR) 40 MG tablet Take 1 tablet (40 mg total) by mouth at bedtime. 90 tablet 0  . sucralfate (CARAFATE) 1 g tablet Take 1 tablet (1 g total) by mouth 4 (four) times daily. 60 tablet 0   No current facility-administered medications on file prior to visit.    There are no Patient Instructions on file for this visit. No Follow-up on file.  Zebulon Gantt A Angelli Baruch, PA-C

## 2017-08-30 DIAGNOSIS — G2581 Restless legs syndrome: Secondary | ICD-10-CM | POA: Diagnosis not present

## 2017-08-30 DIAGNOSIS — G4726 Circadian rhythm sleep disorder, shift work type: Secondary | ICD-10-CM | POA: Diagnosis not present

## 2017-08-30 DIAGNOSIS — F3181 Bipolar II disorder: Secondary | ICD-10-CM | POA: Diagnosis not present

## 2017-08-31 ENCOUNTER — Encounter: Payer: Self-pay | Admitting: Family Medicine

## 2017-08-31 ENCOUNTER — Ambulatory Visit (INDEPENDENT_AMBULATORY_CARE_PROVIDER_SITE_OTHER): Payer: PPO | Admitting: Family Medicine

## 2017-08-31 ENCOUNTER — Telehealth: Payer: Self-pay | Admitting: Family Medicine

## 2017-08-31 VITALS — BP 126/82 | HR 90 | Temp 97.5°F | Resp 18 | Ht 71.0 in | Wt 236.6 lb

## 2017-08-31 DIAGNOSIS — E1165 Type 2 diabetes mellitus with hyperglycemia: Secondary | ICD-10-CM | POA: Diagnosis not present

## 2017-08-31 DIAGNOSIS — T6591XA Toxic effect of unspecified substance, accidental (unintentional), initial encounter: Secondary | ICD-10-CM

## 2017-08-31 DIAGNOSIS — G20 Parkinson's disease: Secondary | ICD-10-CM | POA: Diagnosis not present

## 2017-08-31 LAB — COMPLETE METABOLIC PANEL WITH GFR
AG RATIO: 2 (calc) (ref 1.0–2.5)
ALKALINE PHOSPHATASE (APISO): 71 U/L (ref 40–115)
ALT: 17 U/L (ref 9–46)
AST: 15 U/L (ref 10–35)
Albumin: 4.5 g/dL (ref 3.6–5.1)
BUN: 19 mg/dL (ref 7–25)
CO2: 28 mmol/L (ref 20–32)
Calcium: 9.2 mg/dL (ref 8.6–10.3)
Chloride: 96 mmol/L — ABNORMAL LOW (ref 98–110)
Creat: 0.9 mg/dL (ref 0.70–1.33)
GFR, Est African American: 110 mL/min/{1.73_m2} (ref >=60)
GFR, Est Non African American: 95 mL/min/{1.73_m2} (ref >=60)
GLOBULIN: 2.2 g/dL (ref 1.9–3.7)
Glucose, Bld: 359 mg/dL — ABNORMAL HIGH (ref 65–99)
POTASSIUM: 4.2 mmol/L (ref 3.5–5.3)
SODIUM: 135 mmol/L (ref 135–146)
Total Bilirubin: 0.4 mg/dL (ref 0.2–1.2)
Total Protein: 6.7 g/dL (ref 6.1–8.1)

## 2017-08-31 LAB — GLUCOSE, POCT (MANUAL RESULT ENTRY): POC Glucose: 335 mg/dl — AB (ref 70–99)

## 2017-08-31 MED ORDER — INSULIN DEGLUDEC-LIRAGLUTIDE 100-3.6 UNIT-MG/ML ~~LOC~~ SOPN: 16.0000 [IU] | PEN_INJECTOR | Freq: Every day | SUBCUTANEOUS | 2 refills | Status: DC

## 2017-08-31 NOTE — Telephone Encounter (Signed)
Copied from [place] [phone]. Topic: Inquiry >> Aug 31, 2017  4:11 PM Oliver Pila B wrote: Reason for CRM: AMBER from the [facility] called and is needing a new order or directions for the medication of Insulin Degludec-Liraglutide (XULTOPHY), b/c they dont have any nurses or pharmacy available and is needing this to be put on a sliding scale contact AMBER @ [phone]

## 2017-08-31 NOTE — Progress Notes (Addendum)
Name: Bryan Flowers   MRN: [id number]    DOB: Nov 17, 1960   Date:08/31/2017       Progress Note  Subjective  Chief Complaint  Chief Complaint  Patient presents with  . Hyperglycemia    patient stated that his blood sugar has been running pretty high lately. they  ([facility]) has started checking it twice daily and it has ranged from 200s to over 400. patient stated that nothing has changed with his diet. patient stated that he takes his metformin and glipzide as directed then takes his shot (xultophy) nightly.  . Medication Problem    patient stated that he took his wife's medication by accident. he brought the list with him. he stated that he did not have any side effects from it and feels ok.    HPI  Accidental Medication Error: On 08/27/2017 patient accidentally took Divalprox SOD ER 500mg , DOK 100mg  Soft Gel, Galantamine HBR 4mg , and Quetiapine Fumarate 100mg  at 8:50pm.  He lives at the [facility] and the error was realized immediately by the patient who alerted staff. He reports feeling well since the incident - denies palpitations, chest pain, shortness of breath, headaches, confusion, nausea, vomiting, diarrhea or constipation.  Hyperglycemia: Have been running in the high 300's to low 400's for about 2 weeks. Notation from [facility] [place] requests a sliding scale to be ordered to obtain better control of BG.  Patien tis currently taking Xultophy (16 units daily), glipizide 5mg  daily, metformin 1000mg  BID and has been tolerating these without issue - has been very compliant as [facility] [place] help to manage his medications.  Diet has not changed; has been walking daily for about 45 minutes.  Denies polyphagia and polyuria; endorses polydipsia (states has had dry mouth for a long time).  Patient is not fasting - he ate eggs, sausage link, and a biscuit at about 7:30am (2 hours ago); BG this morning fasting was 258.  Patient Active Problem List   Diagnosis Date Noted  .  Claudication ([place]) 07/16/2017  . Encounter for examination for driving license [id number]/3419  . Annual physical exam 10/01/2016  . Bilateral leg pain 08/20/2016  . Musculoskeletal leg pain, left 07/21/2016  . Fatigue 05/18/2016  . Sore throat 04/16/2016  . Iron deficiency anemia 03/30/2016  . Normocytic anemia due to blood loss 03/27/2016  . Anemia associated with acute blood loss 03/24/2016  . Multiple gastric ulcers   . Duodenal ulcer   . Anemia 03/15/2016  . GI bleed 03/15/2016  . Hyponatremia 03/15/2016  . Acute blood loss anemia 03/15/2016  . Parkinson disease ([place])   . Hypertension   . Subacute maxillary sinusitis 02/27/2016  . Anxiety 08/19/2015  . Type 2 diabetes mellitus ([place]) 08/19/2015  . Abnormal ECG 04/09/2015  . Back ache 04/09/2015  . Clinical depression 04/09/2015  . Uncontrolled type 2 diabetes mellitus ([place]) 04/09/2015  . Obstructive sleep apnea 04/09/2015  . HDL deficiency 04/09/2015  . Hearing loss, sensorineural, combined types 04/09/2015  . HLD (hyperlipidemia) 04/09/2015  . BP (high blood pressure) 04/09/2015  . Idiopathic Parkinson's disease ([place]) 04/09/2015  . Fibrositis 03/15/2014  . Fibromyalgia 03/15/2014  . Bipolar I disorder, single manic episode, moderate ([place]) 03/17/2010  . Testicular hypofunction 05/17/2009  . Migraine with aura 05/14/2009  . Anxiety state 03/19/2008  . Cannot sleep 03/05/2008  . Reflux esophagitis 07/12/2007    Social History   Tobacco Use  . Smoking status: Never Smoker  . Smokeless tobacco: Never Used  Substance Use Topics  . Alcohol use: No    Alcohol/week: 0.0 oz     Current Outpatient Medications:  .  acetaminophen (TYLENOL) 500 MG tablet, Take 1,000 mg by mouth every 6 (six) hours as needed (pain)., Disp: , Rfl:  .  Armodafinil 250 MG tablet, Take by mouth., Disp: , Rfl:  .  aspirin (ASPIRIN 81) 81 MG chewable tablet, Chew by mouth., Disp: , Rfl:  .  b complex vitamins capsule, Take 1 capsule by mouth  daily., Disp: , Rfl:  .  carbidopa-levodopa (SINEMET CR) 50-200 MG per tablet, Take 1 tablet by mouth 3 (three) times daily. , Disp: , Rfl:  .  DULoxetine (CYMBALTA) 60 MG capsule, Take 120 mg by mouth at bedtime. , Disp: , Rfl:  .  famotidine (PEPCID) 40 MG tablet, Take 1 tablet (40 mg total) by mouth every evening., Disp: 30 tablet, Rfl: 1 .  fluticasone (FLONASE) 50 MCG/ACT nasal spray, Place 2 sprays into both nostrils daily., Disp: 16 g, Rfl: 2 .  glipiZIDE (GLUCOTROL) 5 MG tablet, Take 1 tablet (5 mg total) by mouth daily before breakfast., Disp: 90 tablet, Rfl: 0 .  Insulin Degludec-Liraglutide (XULTOPHY) 100-3.6 UNIT-MG/ML SOPN, Inject 16-24 Units into the skin at bedtime. Increase by 2 units every 3 days until fasting BG is </= 180, then remain at that dose.  If not </=180 after reaching 24 units, return to clinic for evaluation., Disp: 15 mL, Rfl: 2 .  lamoTRIgine (LAMICTAL) 200 MG tablet, Take 200 mg by mouth at bedtime. , Disp: , Rfl:  .  LORazepam (ATIVAN) 0.5 MG tablet, Take 0.5 mg by mouth 3 (three) times daily as needed for anxiety. , Disp: , Rfl:  .  LYRICA 50 MG capsule, tk 1 capsule at bedtime, Disp: 30 capsule, Rfl: 2 .  metFORMIN (GLUCOPHAGE) 1000 MG tablet, Take 1 tablet (1,000 mg total) by mouth 2 (two) times daily with a meal., Disp: 180 tablet, Rfl: 0 .  ondansetron (ZOFRAN) 4 MG tablet, Take 1 tablet (4 mg total) by mouth every 8 (eight) hours as needed for nausea or vomiting., Disp: 20 tablet, Rfl: 0 .  pantoprazole (PROTONIX) 40 MG tablet, Take 1 tablet (40 mg total) by mouth daily., Disp: 30 tablet, Rfl: 0 .  propranolol ER (INDERAL LA) 60 MG 24 hr capsule, Take 60 mg by mouth at bedtime. , Disp: , Rfl:  .  QUEtiapine (SEROQUEL XR) 400 MG 24 hr tablet, TK 1 T PO QD AT 8PM ONE HOUR BEFORE OR AFTER A MEAL, Disp: , Rfl: 2 .  quinapril-hydrochlorothiazide (ACCURETIC) 20-12.5 MG tablet, Take 2 tablets by mouth daily., Disp: 180 tablet, Rfl: 0 .  rOPINIRole (REQUIP) 1 MG  tablet, Take 1 mg by mouth at bedtime., Disp: , Rfl:  .  simvastatin (ZOCOR) 40 MG tablet, Take 1 tablet (40 mg total) by mouth at bedtime., Disp: 90 tablet, Rfl: 0 .  sucralfate (CARAFATE) 1 g tablet, Take 1 tablet (1 g total) by mouth 4 (four) times daily., Disp: 60 tablet, Rfl: 0  No Known Allergies  ROS  Constitutional: Negative for fever or weight change.  Respiratory: Negative for cough and shortness of breath.   Cardiovascular: Negative for chest pain or palpitations.  Gastrointestinal: Negative for abdominal pain, no bowel changes.  Musculoskeletal: Negative for gait problem or joint swelling.  Skin: Negative for rash.  Neurological: Negative for dizziness or headache.  No other specific complaints in a complete review of systems (except as listed in HPI  above).  Objective  Vitals:   08/31/17 0902  BP: 126/82  Pulse: 90  Resp: 18  Temp: (!) 97.5 F (36.4 C)  TempSrc: Oral  SpO2: 94%  Weight: 236 lb 9.6 oz (107.3 kg)  Height: 5\' 11"  (1.803 m)   Body mass index is 33 kg/m.  Nursing Note and Vital Signs reviewed.  Physical Exam  Constitutional: Patient appears well-developed and well-nourished. Obese No distress.  HEENT: head atraumatic, normocephalic, pupils equal and reactive to light, EOM's intact, neck supple without lymphadenopathy, oropharynx pink and moist without exudate Cardiovascular: Normal rate, regular rhythm, S1/S2 present with murmur present (baseline per patient).  No murmur or rub heard. No BLE edema. Pulmonary/Chest: Effort normal and breath sounds clear. No respiratory distress or retractions. Abdominal: Soft and non-tender, bowel sounds present x4 quadrants. Psychiatric: Patient has a normal mood and affect. behavior is normal. Judgment and thought content normal.  Recent Results (from the past 2160 hour(s))  Urine Microalbumin w/creat. ratio     Status: None   Collection Time: 07/02/17  8:11 AM  Result Value Ref Range   Creatinine, Urine 54  20 - 320 mg/dL   Microalb, Ur <0.2 mg/dL    Comment: Reference Range Not established    Microalb Creat Ratio NOTE <30 mcg/mg creat    Comment: The microalbumin value is less than 0.2 mg/dL therefore we are unable to calculate excretion and/or creatinine ratio. . . The ADA defines abnormalities in albumin excretion as follows: Marland Kitchen Category         Result (mcg/mg creatinine) . Normal                    <30 Microalbuminuria         30-299  Clinical albuminuria   > OR = 300 . The ADA recommends that at least two of three specimens collected within a 3-6 month period be abnormal before considering a patient to be within a diagnostic category.   Lipid panel     Status: Abnormal   Collection Time: 07/02/17  8:11 AM  Result Value Ref Range   Cholesterol 164 <200 mg/dL   HDL 44 >40 mg/dL   Triglycerides 261 (H) <150 mg/dL   LDL Cholesterol (Calc) 86 mg/dL (calc)    Comment: Reference range: <100 . Desirable range <100 mg/dL for primary prevention;   <70 mg/dL for patients with CHD or diabetic patients  with > or = 2 CHD risk factors. Marland Kitchen LDL-C is now calculated using the Martin-Hopkins  calculation, which is a validated novel method providing  better accuracy than the Friedewald equation in the  estimation of LDL-C.  Cresenciano Genre et al. Annamaria Helling. 8527;782(42): 2061-2068  (http://education.[employer].com/faq/FAQ164)    Total CHOL/HDL Ratio 3.7 <5.0 (calc)   Non-HDL Cholesterol (Calc) 120 <130 mg/dL (calc)    Comment: For patients with diabetes plus 1 major ASCVD risk  factor, treating to a non-HDL-C goal of <100 mg/dL  (LDL-C of <70 mg/dL) is considered a therapeutic  option.   COMPLETE METABOLIC PANEL WITH GFR     Status: Abnormal   Collection Time: 07/02/17  8:11 AM  Result Value Ref Range   Glucose, Bld 164 (H) 65 - 99 mg/dL    Comment: .            Fasting reference interval . For someone without known diabetes, a glucose value >125 mg/dL indicates that they may  have diabetes and this should be confirmed with a follow-up test. .  BUN 13 7 - 25 mg/dL   Creat 1.00 0.70 - 1.33 mg/dL    Comment: For patients >54 years of age, the reference limit for Creatinine is approximately 13% higher for people identified as African-American. .    GFR, Est Non African American 84 > OR = 60 mL/min/1.7m2   GFR, Est African American 98 > OR = 60 mL/min/1.86m2   BUN/Creatinine Ratio NOT APPLICABLE 6 - 22 (calc)   Sodium 141 135 - 146 mmol/L   Potassium 4.9 3.5 - 5.3 mmol/L   Chloride 102 98 - 110 mmol/L   CO2 31 20 - 32 mmol/L   Calcium 9.6 8.6 - 10.3 mg/dL   Total Protein 6.5 6.1 - 8.1 g/dL   Albumin 4.3 3.6 - 5.1 g/dL   Globulin 2.2 1.9 - 3.7 g/dL (calc)   AG Ratio 2.0 1.0 - 2.5 (calc)   Total Bilirubin 0.3 0.2 - 1.2 mg/dL   Alkaline phosphatase (APISO) 60 40 - 115 U/L   AST 14 10 - 35 U/L   ALT 22 9 - 46 U/L  POCT Glucose (CBG)     Status: Abnormal   Collection Time: 07/06/17  1:32 PM  Result Value Ref Range   POC Glucose 298 (A) 70 - 99 mg/dl  POCT HgB A1C     Status: None   Collection Time: 07/06/17  1:38 PM  Result Value Ref Range   Hemoglobin A1C 7.8   POCT glucose (manual entry)     Status: Abnormal   Collection Time: 08/31/17 10:09 AM  Result Value Ref Range   POC Glucose 335 (A) 70 - 99 mg/dl     Assessment & Plan  1. Uncontrolled type 2 diabetes mellitus with hyperglycemia (HCC) - POCT glucose (manual entry) - Insulin Degludec-Liraglutide (XULTOPHY) 100-3.6 UNIT-MG/ML SOPN; Inject 16-24 Units into the skin at bedtime. Increase by 2 units every 3 days until fasting BG is </= 180, then remain at that dose.  If not </=180 after reaching 24 units, return to clinic for evaluation.  Dispense: 15 mL; Refill: 2 - COMPLETE METABOLIC PANEL WITH GFR - CBG today is 335 in office, vitals stable. - Increase by 2 units every 3 days until fasting BG is </= 180, then remain at that dose.  If not </=180 after reaching 24 units, return to clinic for  evaluation.  2. Accidental ingestion of substance, initial encounter - Vitals are stable, patient denies symptoms, incident occurred 4 days prior.  No ECG indicated at this time; we will check CMP only.  Review of medications and patient's medication list was performed in conjunction with Dr. Sanda Klein who agrees with plan of care. - COMPLETE METABOLIC PANEL WITH GFR  3. Parkinson disease ([place]) - After review of medications, I recommend stopping Zyrtec due to patient's Parkinson Disease to avoid exacerbation of anticholinergic effect and to possibly improve patient's dry mouth.  -Red flags and when to present for emergency care or RTC including fever >101.34F, chest pain, shortness of breath, palpitations, weakness, confusion, new/worsening/un-resolving symptoms, reviewed with patient at time of visit. Follow up and care instructions discussed and provided in AVS.

## 2017-08-31 NOTE — Patient Instructions (Signed)
Increase by 2 units every 3 days until fasting BG is </= 180, then remain at that dose.  If not </=180 after reaching 24 units, return to clinic for evaluation.

## 2017-09-01 NOTE — Telephone Encounter (Signed)
Caller name: Amber  Relation to [facility] [place]  Call back number: [phone]    Reason for call:  Please fax orders to [phone] in addition to the message below, requesting insulin clarity regarding insulin stating current directions reflect  16 to 20 but she states it has to be either or requesting new Rx. Please advise

## 2017-09-02 MED ORDER — INSULIN DEGLUDEC-LIRAGLUTIDE 100-3.6 UNIT-MG/ML ~~LOC~~ SOPN: 16.0000 [IU] | PEN_INJECTOR | Freq: Every day | SUBCUTANEOUS | 2 refills | Status: DC

## 2017-09-02 NOTE — Telephone Encounter (Signed)
Faxed prescription to [facility] [address].

## 2017-09-02 NOTE — Telephone Encounter (Signed)
Spoke with [facility] directly who provided telephone number to Pharmacy Order Entry at [phone].  The pharmacy representative then directed me to call Bryan Flowers at [phone] who manages [place] relations with [facility] [place].  Pt's Glucose today was 245.  Yesterday was 247.  Discussed best options for staff to provide titration with appropriate orders - as Q3day increase is not feasible due to possibility of 3rd day landing on a weekend day when the pharmacy cannot adjust dosing. The following will be provided in new Rx:  Increase by 2 units every Monday and Thursday until BG is </= 180. Pharmacy to dose. Do not exceed 24 units - if not controlled at 24 units, return to PCP.  Will fax to [facility] and to [facility].

## 2017-09-02 NOTE — Telephone Encounter (Signed)
They need to titrate the patient up on the Xultophy slowly.  This is not a medication that you can use sliding scale.  Please instruct: 1. Check fasting glucose each morning. 2. Increase Xultophy by 2 units every 3 days until glucose is </= 180. 3. If, after titrating up to 24 units, fasting glucose is still not </= 180, patient must return for re-evaluation with PCP.  Again, this is not a medication that they can do sliding scale with, this is a titratable medication that needs to be slowly titrated over several days as above.  If they need to speak with me directly, I am happy to do so.  Thanks!

## 2017-09-03 NOTE — Telephone Encounter (Signed)
Left a message going over the insulin instructions from [facility]. Also mention on the message that the orders and instructions has been faxed over to the [facility] [employer] [facility].

## 2017-09-03 NOTE — Telephone Encounter (Signed)
Copied from [place] [id number]. Topic: Quick Communication - See Telephone Encounter >> Sep 03, 2017 10:20 AM Aurelio Brash B wrote: CRM for notification. See Telephone encounter for:  PT accidentally erased voice mail and needs a call back concerning insulin instructions 09/03/17.

## 2017-09-06 ENCOUNTER — Telehealth: Payer: Self-pay

## 2017-09-06 NOTE — Telephone Encounter (Signed)
LMOM for Judson Roch to call me back.

## 2017-09-06 NOTE — Telephone Encounter (Signed)
Copied from [place] [id number]. Topic: General - Other >> Sep 06, 2017 10:51 AM Carolyn Stare wrote: Reason for CRM:    Sarah with [employer] call to say she spoke with Raelyn Ensign and is calling back today to speak with her concerning pt insulin order   Would like a call back [phone]

## 2017-09-06 NOTE — Telephone Encounter (Signed)
Copied from [place] [id number]. Topic: General - Other >> Sep 06, 2017 10:51 AM Carolyn Stare wrote: Reason for CRM:    Sarah with [employer] call to say she spoke with Raelyn Ensign and is calling back today to speak with her concerning pt insulin order   Would like a call back [phone]

## 2017-09-07 ENCOUNTER — Telehealth: Payer: Self-pay | Admitting: Family Medicine

## 2017-09-07 NOTE — Telephone Encounter (Signed)
Spoke with Judson Roch - tapered up to 18 units yesterday, will reassess BG on Thursday and taper up by 2 units if needed. States she [facility] call from [facility] stating patient was to go straight to 24 units - I advised this was incorrect, and that patient needs to taper up slowly as previously advised. She is in agreement and will continue orders as previously provided.

## 2017-09-07 NOTE — Telephone Encounter (Addendum)
Attempted to contact pt regarding lab results/instructions per Raelyn Ensign; left message on voice mail [phone] to call MD's office back; see CRM # [phone].

## 2017-09-08 ENCOUNTER — Ambulatory Visit (INDEPENDENT_AMBULATORY_CARE_PROVIDER_SITE_OTHER): Payer: PPO

## 2017-09-08 VITALS — BP 138/82 | HR 92 | Temp 97.5°F | Resp 20 | Ht 71.0 in | Wt 234.1 lb

## 2017-09-08 DIAGNOSIS — Z114 Encounter for screening for human immunodeficiency virus [HIV]: Secondary | ICD-10-CM | POA: Diagnosis not present

## 2017-09-08 DIAGNOSIS — Z0000 Encounter for general adult medical examination without abnormal findings: Secondary | ICD-10-CM

## 2017-09-08 DIAGNOSIS — Z23 Encounter for immunization: Secondary | ICD-10-CM

## 2017-09-08 DIAGNOSIS — Z1159 Encounter for screening for other viral diseases: Secondary | ICD-10-CM

## 2017-09-08 NOTE — Progress Notes (Signed)
Subjective:   Bryan Flowers is a 56 y.o. male who presents for Medicare Annual/Subsequent preventive examination.  Review of Systems:  N/A Cardiac Risk Factors include: advanced age (>7men, >54 women);diabetes mellitus;dyslipidemia;hypertension;obesity (BMI >30kg/m2);male gender     Objective:    Vitals: BP 138/82 (BP Location: Left Arm, Patient Position: Sitting)   Pulse 92   Temp (!) 97.5 F (36.4 C) (Oral)   Resp 20   Ht 5\' 11"  (1.803 m)   Wt 234 lb 1.6 oz (106.2 kg)   BMI 32.65 kg/m   Body mass index is 32.65 kg/m.  Advanced Directives 09/08/2017 07/06/2017 06/17/2017 06/09/2017 04/05/2017 03/30/2017 03/23/2017  Does Patient Have a Medical Advance Directive? Yes No No No Yes Yes Yes  Type of Advance Directive Conception  Does patient want to make changes to medical advance directive? - - - - - - -  Copy of [place] in Chart? Yes - - - - - -  Would patient like information on creating a medical advance directive? - - - - - - -    Tobacco Social History   Tobacco Use  Smoking Status Never Smoker  Smokeless Tobacco Never Used  Tobacco Comment   Non-smoker     Counseling given: Not Answered Comment: Non-smoker   Clinical Intake:  Pre-visit preparation completed: Yes  Pain : No/denies pain     BMI - recorded: 32.65 Nutritional Status: BMI > 30  Obese Nutritional Risks: None Diabetes: Yes CBG done?: No CBG resulted in Enter/ Edit results?: No Did pt. bring in CBG monitor from home?: No  Activities of Daily Living: Independent Ambulation: Independent with device- listed below Home Assistive Devices/Equipment: Eyeglasses, Communication device (specify type), CPAP, Other (Comment)(right ear hearing aid; grab bars in shower) Medication Administration: Independent Home Management: Independent  Barriers to Care Management & Learning: None  Do you feel unsafe  in your current relationship?: No(married) Do you feel physically threatened by others?: No Anyone hurting you at home, work, or school?: No  How often do you need to have someone help you when you read instructions, pamphlets, or other written materials from your doctor or pharmacy?: 1 - Never What is the last grade level you completed in school?: 12th grade  Interpreter Needed?: No  Information entered by :: AEversole, LPN  Past Medical History:  Diagnosis Date  . Anemia    iron deficiency  . Bipolar 1 disorder, manic, moderate ([place])   . Depression   . Diabetes mellitus without complication ([place])   . GERD (gastroesophageal reflux disease)   . Hyperlipidemia   . Hypertension   . Insomnia   . Parkinson disease ([place])   . Peptic ulcer disease with hemorrhage    Past Surgical History:  Procedure Laterality Date  . ESOPHAGOGASTRODUODENOSCOPY Left 03/16/2016   Procedure: ESOPHAGOGASTRODUODENOSCOPY (EGD);  Surgeon: Manus Gunning, MD;  Location: [facility];  Service: Gastroenterology;  Laterality: Left;  . FOOT FRACTURE SURGERY Right    Family History  Problem Relation Age of Onset  . Cancer Mother   . Heart disease Father   . Arthritis Brother    Social History   Socioeconomic History  . Marital status: Married    Spouse name: None  . Number of children: 0  . Years of education: None  . Highest education level: None  Social Needs  . Financial resource strain: Not hard at all  .  Food insecurity - worry: Never true  . Food insecurity - inability: Never true  . Transportation needs - medical: No  . Transportation needs - non-medical: No  Occupational History    Employer: DISABLED  Tobacco Use  . Smoking status: Never Smoker  . Smokeless tobacco: Never Used  . Tobacco comment: Non-smoker  Substance and Sexual Activity  . Alcohol use: No    Alcohol/week: 0.0 oz  . Drug use: No  . Sexual activity: No    Partners: Female  Other Topics Concern  . None    Social History Narrative  . None    Outpatient Encounter Medications as of 09/08/2017  Medication Sig  . acetaminophen (TYLENOL) 500 MG tablet Take 1,000 mg by mouth every 6 (six) hours as needed (pain).  . Armodafinil 250 MG tablet Take by mouth.  Marland Kitchen b complex vitamins capsule Take 1 capsule by mouth daily.  . carbidopa-levodopa (SINEMET CR) 50-200 MG per tablet Take 1 tablet by mouth 3 (three) times daily.   . DULoxetine (CYMBALTA) 60 MG capsule Take 120 mg by mouth at bedtime.   . famotidine (PEPCID) 40 MG tablet Take 1 tablet (40 mg total) by mouth every evening.  . fluticasone (FLONASE) 50 MCG/ACT nasal spray Place 2 sprays into both nostrils daily.  Marland Kitchen glipiZIDE (GLUCOTROL) 5 MG tablet Take 1 tablet (5 mg total) by mouth daily before breakfast.  . Insulin Degludec-Liraglutide (XULTOPHY) 100-3.6 UNIT-MG/ML SOPN Inject 16-24 Units into the skin at bedtime. Increase by 2 units every Monday and Thursday until BG is </= 180. Pharmacy to dose. Do not exceed 24 units - if not controlled at 24 units, return to PCP.  Marland Kitchen lamoTRIgine (LAMICTAL) 200 MG tablet Take 200 mg by mouth at bedtime.   Marland Kitchen LORazepam (ATIVAN) 0.5 MG tablet Take 0.5 mg by mouth 3 (three) times daily as needed for anxiety.   Marland Kitchen LYRICA 50 MG capsule tk 1 capsule at bedtime  . metFORMIN (GLUCOPHAGE) 1000 MG tablet Take 1 tablet (1,000 mg total) by mouth 2 (two) times daily with a meal.  . ondansetron (ZOFRAN) 4 MG tablet Take 1 tablet (4 mg total) by mouth every 8 (eight) hours as needed for nausea or vomiting.  . pantoprazole (PROTONIX) 40 MG tablet Take 1 tablet (40 mg total) by mouth daily.  . propranolol ER (INDERAL LA) 60 MG 24 hr capsule Take 60 mg by mouth at bedtime.   Marland Kitchen QUEtiapine (SEROQUEL XR) 400 MG 24 hr tablet TK 1 T PO QD AT 8PM ONE HOUR BEFORE OR AFTER A MEAL  . quinapril-hydrochlorothiazide (ACCURETIC) 20-12.5 MG tablet Take 2 tablets by mouth daily.  Marland Kitchen rOPINIRole (REQUIP) 1 MG tablet Take 1 mg by mouth at bedtime.   . simvastatin (ZOCOR) 40 MG tablet Take 1 tablet (40 mg total) by mouth at bedtime.  . sucralfate (CARAFATE) 1 g tablet Take 1 tablet (1 g total) by mouth 4 (four) times daily.  Marland Kitchen aspirin (ASPIRIN 81) 81 MG chewable tablet Chew by mouth.   No facility-administered encounter medications on file as of 09/08/2017.     Activities of Daily Living In your present state of health, do you have any difficulty performing the following activities: 09/08/2017 08/31/2017  Hearing? N Y  [place]? N Y  Comment - -  Difficulty concentrating or making decisions? N N  Walking or climbing stairs? Y N  Comment joint pain -  Dressing or bathing? N N  Doing errands, shopping? N N  Preparing Food and eating ? N -  Using the Toilet? N -  In the past six months, have you accidently leaked urine? N -  Do you have problems with loss of bowel control? N -  Managing your Medications? N -  Managing your Finances? N -  Housekeeping or managing your Housekeeping? N -  Some recent data might be hidden    Timed Get Up and Go Performed: Yes. 10 sec. No intervention required  Patient Care Team: Roselee Nova, MD as PCP - General (Family Medicine) Madelyn Brunner, MD as Consulting Physician (Neurology) [name], NP as Consulting Physician   Assessment:   Exercise Activities and Dietary recommendations Current Exercise Habits: The patient does not participate in regular exercise at present, Type of exercise: walking, Time (Minutes): 30, Frequency (Times/Week): 7, Weekly Exercise (Minutes/Week): 210, Intensity: Mild, Exercise limited by: None identified  Goals    None     Fall Risk Fall Risk  09/08/2017 08/31/2017 08/10/2017 06/17/2017 06/09/2017  Falls in the past year? No No No No No   Is the patient's home free of loose throw rugs in walkways, pet beds, electrical cords, etc?   yes      Grab bars in the bathroom? yes      Handrails on the stairs?   yes      Adequate lighting?    yes Depression Screen PHQ 2/9 Scores 09/08/2017 08/31/2017 08/10/2017 06/17/2017  PHQ - 2 Score 0 0 0 0    Cognitive Function     6CIT Screen 09/08/2017  What Year? 0 points  What month? 0 points  What time? 3 points  Count back from 20 0 points  Months in reverse 0 points  Repeat phrase 0 points  Total Score 3    Immunization History  Administered Date(s) Administered  . Influenza, Seasonal, Injecte, Preservative Fre 06/19/2010, 07/07/2012  . Influenza,inj,Quad PF,6+ Mos 08/02/2013, 07/10/2014, 11/02/2016, 07/06/2017  . Pneumococcal Polysaccharide-23 07/07/2012  . Tdap 04/23/2011   Screening Tests Health Maintenance  Topic Date Due  . Hepatitis C Screening  Mar 13, 1961  . HIV Screening  08/10/1976  . HEMOGLOBIN A1C  01/04/2018  . OPHTHALMOLOGY EXAM  05/06/2018  . FOOT EXAM  07/06/2018  . COLONOSCOPY  06/02/2019  . TETANUS/TDAP  04/22/2021  . PNEUMOCOCCAL POLYSACCHARIDE VACCINE  Completed  . INFLUENZA VACCINE  Completed   Cancer Screenings: Colorectal: Completed 06/01/16. Repeat colonoscopy in 3 years.  Additional Screenings: Hepatitis B/HIV/Syphillis: Hepatitis C Screening:     Plan:   I have personally reviewed and addressed the Medicare Annual Wellness questionnaire and have noted the following in the patient's chart:  A. Medical and social history B. Use of alcohol, tobacco or illicit drugs  C. Current medications and supplements D. Functional ability and status E.  Nutritional status F.  Physical activity G. Advance directives and Code Status: Pt provided with MOST and DNR documents for his/her review. Pt has been advised that he/she will only need to complete the document that is most appropriate to suit his/her health care wishes. Once completed, pt has been advised to return the appropriate document to our office for the physician to review and sign. H. List of other physicians I.  Hospitalizations, surgeries, and ER visits in previous 12 months J.   [place] such as hearing and vision if needed, cognitive and depression L. Referrals and appointments - none  In addition, I have reviewed and discussed with patient certain preventive protocols, quality metrics, and  best practice recommendations. A written personalized care plan for preventive services as well as general preventive health recommendations were provided to patient.  See attached scanned questionnaire for additional information.   Signed,  Aleatha Borer, LPN Nurse Health Advisor

## 2017-09-08 NOTE — Patient Instructions (Signed)
Mr. Bryan Flowers , Thank you for taking time to come for your Medicare Wellness Visit. I appreciate your ongoing commitment to your health goals. Please review the following plan we discussed and let me know if I can assist you in the future.   Screening recommendations/referrals: Colonoscopy: Completed 06/01/16. Repeat colonoscopy in 3 years. Recommended yearly ophthalmology/optometry visit for glaucoma screening and checkup Recommended yearly dental visit for hygiene and checkup  Vaccinations: Influenza vaccine: Completed 07/06/17 Pneumococcal vaccine: PPSV23 completed today Tdap vaccine: Completed 04/23/11 Shingles vaccine: Declined. Please call your insurance company to determine your out of pocket expense.  Advanced directives: A copy of your POA (Power of Attorney) has been scanned in to your chart for our review. I have provided a copy of the Living Will for you to complete at home and have notarized. Once this is complete please bring a copy in to our office so we can scan it into your chart.  Conditions/risks identified: Fall risk prevention discussed  Next appointment: You are scheduled to see Dr. [name] on 09/16/17 @ 8:40am.   Please schedule your annual wellness exam with your Nurse Health Advisor in one year.  Preventive Care 40-64 Years, Male Preventive care refers to lifestyle choices and visits with your health care provider that can promote health and wellness. What does preventive care include?  A yearly physical exam. This is also called an annual well check.  Dental exams once or twice a year.  Routine eye exams. Ask your health care provider how often you should have your eyes checked.  Personal lifestyle choices, including:  Daily care of your teeth and gums.  Regular physical activity.  Eating a healthy diet.  Avoiding tobacco and drug use.  Limiting alcohol use.  Practicing safe sex.  Taking low-dose aspirin every day starting at age 48. What happens during  an annual well check? The services and screenings done by your health care provider during your annual well check will depend on your age, overall health, lifestyle risk factors, and family history of disease. Counseling  Your health care provider may ask you questions about your:  Alcohol use.  Tobacco use.  Drug use.  Emotional well-being.  Home and relationship well-being.  Sexual activity.  Eating habits.  Work and work Statistician. Screening  You may have the following tests or measurements:  Height, weight, and BMI.  Blood pressure.  Lipid and cholesterol levels. These may be checked every 5 years, or more frequently if you are over 45 years old.  Skin check.  Lung cancer screening. You may have this screening every year starting at age 45 if you have a 30-pack-year history of smoking and currently smoke or have quit within the past 15 years.  Fecal occult blood test (FOBT) of the stool. You may have this test every year starting at age 100.  Flexible sigmoidoscopy or colonoscopy. You may have a sigmoidoscopy every 5 years or a colonoscopy every 10 years starting at age 56.  Prostate cancer screening. Recommendations will vary depending on your family history and other risks.  Hepatitis C blood test.  Hepatitis B blood test.  Sexually transmitted disease (STD) testing.  Diabetes screening. This is done by checking your blood sugar (glucose) after you have not eaten for a while (fasting). You may have this done every 1-3 years. Discuss your test results, treatment options, and if necessary, the need for more tests with your health care provider. Vaccines  Your health care provider may recommend certain vaccines, such  as:  Influenza vaccine. This is recommended every year.  Tetanus, diphtheria, and acellular pertussis (Tdap, Td) vaccine. You may need a Td booster every 10 years.  Zoster vaccine. You may need this after age 67.  Pneumococcal 13-valent  conjugate (PCV13) vaccine. You may need this if you have certain conditions and have not been vaccinated.  Pneumococcal polysaccharide (PPSV23) vaccine. You may need one or two doses if you smoke cigarettes or if you have certain conditions. Talk to your health care provider about which screenings and vaccines you need and how often you need them. This information is not intended to replace advice given to you by your health care provider. Make sure you discuss any questions you have with your health care provider. Document Released: 10/18/2015 Document Revised: 06/10/2016 Document Reviewed: 07/23/2015 Elsevier Interactive Patient Education  2017 [place] Prevention in the Home Falls can cause injuries. They can happen to people of all ages. There are many things you can do to make your home safe and to help prevent falls. What can I do on the outside of my home?  Regularly fix the edges of walkways and driveways and fix any cracks.  Remove anything that might make you trip as you walk through a door, such as a raised step or threshold.  Trim any bushes or trees on the path to your home.  Use bright outdoor lighting.  Clear any walking paths of anything that might make someone trip, such as rocks or tools.  Regularly check to see if handrails are loose or broken. Make sure that both sides of any steps have handrails.  Any raised decks and porches should have guardrails on the edges.  Have any leaves, snow, or ice cleared regularly.  Use sand or salt on walking paths during winter.  Clean up any spills in your garage right away. This includes oil or grease spills. What can I do in the bathroom?  Use night lights.  Install grab bars by the toilet and in the tub and shower. Do not use towel bars as grab bars.  Use non-skid mats or decals in the tub or shower.  If you need to sit down in the shower, use a plastic, non-slip stool.  Keep the floor dry. Clean up any  water that spills on the floor as soon as it happens.  Remove soap buildup in the tub or shower regularly.  Attach bath mats securely with double-sided non-slip rug tape.  Do not have throw rugs and other things on the floor that can make you trip. What can I do in the bedroom?  Use night lights.  Make sure that you have a light by your bed that is easy to reach.  Do not use any sheets or blankets that are too big for your bed. They should not hang down onto the floor.  Have a firm chair that has side arms. You can use this for support while you get dressed.  Do not have throw rugs and other things on the floor that can make you trip. What can I do in the kitchen?  Clean up any spills right away.  Avoid walking on wet floors.  Keep items that you use a lot in easy-to-reach places.  If you need to reach something above you, use a strong step stool that has a grab bar.  Keep electrical cords out of the way.  Do not use floor polish or wax that makes floors slippery. If you must  use wax, use non-skid floor wax.  Do not have throw rugs and other things on the floor that can make you trip. What can I do with my stairs?  Do not leave any items on the stairs.  Make sure that there are handrails on both sides of the stairs and use them. Fix handrails that are broken or loose. Make sure that handrails are as long as the stairways.  Check any carpeting to make sure that it is firmly attached to the stairs. Fix any carpet that is loose or worn.  Avoid having throw rugs at the top or bottom of the stairs. If you do have throw rugs, attach them to the floor with carpet tape.  Make sure that you have a light switch at the top of the stairs and the bottom of the stairs. If you do not have them, ask someone to add them for you. What else can I do to help prevent falls?  Wear shoes that:  Do not have high heels.  Have rubber bottoms.  Are comfortable and fit you well.  Are closed  at the toe. Do not wear sandals.  If you use a stepladder:  Make sure that it is fully opened. Do not climb a closed stepladder.  Make sure that both sides of the stepladder are locked into place.  Ask someone to hold it for you, if possible.  Clearly mark and make sure that you can see:  Any grab bars or handrails.  First and last steps.  Where the edge of each step is.  Use tools that help you move around (mobility aids) if they are needed. These include:  Canes.  Walkers.  Scooters.  Crutches.  Turn on the lights when you go into a dark area. Replace any light bulbs as soon as they burn out.  Set up your furniture so you have a clear path. Avoid moving your furniture around.  If any of your floors are uneven, fix them.  If there are any pets around you, be aware of where they are.  Review your medicines with your doctor. Some medicines can make you feel dizzy. This can increase your chance of falling. Ask your doctor what other things that you can do to help prevent falls. This information is not intended to replace advice given to you by your health care provider. Make sure you discuss any questions you have with your health care provider. Document Released: 07/18/2009 Document Revised: 02/27/2016 Document Reviewed: 10/26/2014 Elsevier Interactive Patient Education  2017 [employer].

## 2017-09-09 LAB — HEPATITIS C ANTIBODY
Hepatitis C Ab: NONREACTIVE
SIGNAL TO CUT-OFF: 0.01 (ref <1.00)

## 2017-09-09 LAB — HIV ANTIBODY (ROUTINE TESTING W REFLEX): HIV 1&2 Ab, 4th Generation: NONREACTIVE

## 2017-09-10 ENCOUNTER — Ambulatory Visit (INDEPENDENT_AMBULATORY_CARE_PROVIDER_SITE_OTHER): Payer: PPO | Admitting: Family Medicine

## 2017-09-10 ENCOUNTER — Encounter: Payer: Self-pay | Admitting: Family Medicine

## 2017-09-10 ENCOUNTER — Telehealth: Payer: Self-pay | Admitting: Family Medicine

## 2017-09-10 VITALS — BP 100/64 | HR 96 | Temp 97.6°F | Ht 71.0 in | Wt 231.7 lb

## 2017-09-10 DIAGNOSIS — R059 Cough, unspecified: Secondary | ICD-10-CM

## 2017-09-10 DIAGNOSIS — R05 Cough: Secondary | ICD-10-CM | POA: Diagnosis not present

## 2017-09-10 DIAGNOSIS — J069 Acute upper respiratory infection, unspecified: Secondary | ICD-10-CM | POA: Diagnosis not present

## 2017-09-10 DIAGNOSIS — J029 Acute pharyngitis, unspecified: Secondary | ICD-10-CM | POA: Diagnosis not present

## 2017-09-10 DIAGNOSIS — E559 Vitamin D deficiency, unspecified: Secondary | ICD-10-CM | POA: Insufficient documentation

## 2017-09-10 DIAGNOSIS — K219 Gastro-esophageal reflux disease without esophagitis: Secondary | ICD-10-CM | POA: Insufficient documentation

## 2017-09-10 DIAGNOSIS — E1165 Type 2 diabetes mellitus with hyperglycemia: Secondary | ICD-10-CM | POA: Diagnosis not present

## 2017-09-10 MED ORDER — FIRST-DUKES MOUTHWASH MT SUSP: 5.0000 mL | Freq: Three times a day (TID) | OROMUCOSAL | 0 refills | Status: DC | PRN

## 2017-09-10 MED ORDER — BENZONATATE 100 MG PO CAPS: 100.0000 mg | ORAL_CAPSULE | Freq: Three times a day (TID) | ORAL | 0 refills | Status: DC | PRN

## 2017-09-10 NOTE — Telephone Encounter (Signed)
Placed call to both Bryan Flowers with [facility] and [employer] with [employer]. Left messages on both machines for call back regarding patient's Xultophy. Patient reported during his visit today that he was not given his dose of Xultophy last night because there was confusion about the orders.

## 2017-09-10 NOTE — Patient Instructions (Addendum)
Cool Mist Vaporizer A cool mist vaporizer is a device that releases a cool mist into the air. If you have a cough or a cold, using a vaporizer may help relieve your symptoms. The mist adds moisture to the air, which may help thin your mucus and make it less sticky. When your mucus is thin and less sticky, it easier for you to breathe and to cough up secretions. Do not use a vaporizer if you are allergic to mold. Follow these instructions at home:  Follow the instructions that come with the vaporizer.  Do not use anything other than distilled water in the vaporizer.  Do not run the vaporizer all of the time. Doing that can cause mold or bacteria to grow in the vaporizer.  Clean the vaporizer after each time that you use it.  Clean and dry the vaporizer well before storing it.  Stop using the vaporizer if your breathing symptoms get worse. This information is not intended to replace advice given to you by your health care provider. Make sure you discuss any questions you have with your health care provider. Document Released: 06/18/2004 Document Revised: 04/10/2016 Document Reviewed: 12/21/2015 Elsevier Interactive Patient Education  2018 Else[employer] Inc.   Upper Respiratory Infection, Adult Most upper respiratory infections (URIs) are caused by a virus. A URI affects the nose, throat, and upper air passages. The most common type of URI is often called "the common cold." Follow these instructions at home:  Take medicines only as told by your doctor.  Gargle warm saltwater or take cough drops to comfort your throat as told by your doctor.  Use a warm mist humidifier or inhale steam from a shower to increase air moisture. This may make it easier to breathe.  Drink enough fluid to keep your pee (urine) clear or pale yellow.  Eat soups and other clear broths.  Have a healthy diet.  Rest as needed.  Go back to work when your fever is gone or your doctor says it is okay. ? You may need  to stay home longer to avoid giving your URI to others. ? You can also wear a face mask and wash your hands often to prevent spread of the virus.  Use your inhaler more if you have asthma.  Do not use any tobacco products, including cigarettes, chewing tobacco, or electronic cigarettes. If you need help quitting, ask your doctor. Contact a doctor if:  You are getting worse, not better.  Your symptoms are not helped by medicine.  You have chills.  You are getting more short of breath.  You have brown or red mucus.  You have yellow or brown discharge from your nose.  You have pain in your face, especially when you bend forward.  You have a fever.  You have puffy (swollen) neck glands.  You have pain while swallowing.  You have white areas in the back of your throat. Get help right away if:  You have very bad or constant: ? Headache. ? Ear pain. ? Pain in your forehead, behind your eyes, and over your cheekbones (sinus pain). ? Chest pain.  You have long-lasting (chronic) lung disease and any of the following: ? Wheezing. ? Long-lasting cough. ? Coughing up blood. ? A change in your usual mucus.  You have a stiff neck.  You have changes in your: ? Vision. ? Hearing. ? Thinking. ? Mood. This information is not intended to replace advice given to you by your health care provider. Make   sure you discuss any questions you have with your health care provider. Document Released: 03/09/2008 Document Revised: 05/24/2016 Document Reviewed: 12/27/2013 Elsevier Interactive Patient Education  2018 Elsevier Inc.   

## 2017-09-10 NOTE — Progress Notes (Signed)
Name: Bryan Flowers   MRN: [id number]    DOB: 10-Sep-1961   Date:09/10/2017       Progress Note  Subjective  Chief Complaint  Chief Complaint  Patient presents with  . Nasal Congestion    Sx started 1 week ago, blowing out brownish color  . Cough    Non productive  . Sore Throat  . Ear Fullness    Left ear    HPI  Patient presents with 4-5 days of upper respiratory illness - complains of nasal congest, very dry/sore throat, dry hacking cough, LEFT ear pressure/fullness, some body aches.  Denies chest pain, shortness of breath, fevers, chills, body ache, abdominal pain, NVD.  Takes flonase daily already; has been taking Nyquil without relief.  PT is diabetic, BG's are running in the 200's over the last few days (was seen recently for BG's in the 300's, is tapering up on Xultophy, currently at 20 units). PT voices concerns that he did not receive his Xultophy last night from his assisted living facility due to dosing confusion - the [facility]. Advised patient that I will call today - [facility] and [facility] to discuss Xultophy dosing.  Denies polyphagia, polyuria, endorses mild polydipsia - says drinking more due to current illness.  Patient Active Problem List   Diagnosis Date Noted  . Claudication ([place]) 07/16/2017  . Encounter for examination for driving license 07/21/5101  . Annual physical exam 10/01/2016  . Bilateral leg pain 08/20/2016  . Musculoskeletal leg pain, left 07/21/2016  . Fatigue 05/18/2016  . Sore throat 04/16/2016  . Iron deficiency anemia 03/30/2016  . Normocytic anemia due to blood loss 03/27/2016  . Anemia associated with acute blood loss 03/24/2016  . Multiple gastric ulcers   . Duodenal ulcer   . Anemia 03/15/2016  . GI bleed 03/15/2016  . Hyponatremia 03/15/2016  . Acute blood loss anemia 03/15/2016  . Parkinson disease ([place])   . Hypertension   . Subacute maxillary sinusitis 02/27/2016  . Anxiety 08/19/2015  . Type 2  diabetes mellitus ([place]) 08/19/2015  . Abnormal ECG 04/09/2015  . Back ache 04/09/2015  . Clinical depression 04/09/2015  . Uncontrolled type 2 diabetes mellitus ([place]) 04/09/2015  . Obstructive sleep apnea 04/09/2015  . HDL deficiency 04/09/2015  . Hearing loss, sensorineural, combined types 04/09/2015  . HLD (hyperlipidemia) 04/09/2015  . BP (high blood pressure) 04/09/2015  . Idiopathic Parkinson's disease ([place]) 04/09/2015  . Fibrositis 03/15/2014  . Fibromyalgia 03/15/2014  . Bipolar I disorder, single manic episode, moderate ([place]) 03/17/2010  . Testicular hypofunction 05/17/2009  . Migraine with aura 05/14/2009  . Anxiety state 03/19/2008  . Cannot sleep 03/05/2008  . Reflux esophagitis 07/12/2007    Social History   Tobacco Use  . Smoking status: Never Smoker  . Smokeless tobacco: Never Used  . Tobacco comment: Non-smoker  Substance Use Topics  . Alcohol use: No    Alcohol/week: 0.0 oz     Current Outpatient Medications:  .  acetaminophen (TYLENOL) 500 MG tablet, Take 1,000 mg by mouth every 6 (six) hours as needed (pain)., Disp: , Rfl:  .  Armodafinil 250 MG tablet, Take by mouth., Disp: , Rfl:  .  b complex vitamins capsule, Take 1 capsule by mouth daily., Disp: , Rfl:  .  carbidopa-levodopa (SINEMET CR) 50-200 MG per tablet, Take 1 tablet by mouth 3 (three) times daily. , Disp: , Rfl:  .  DULoxetine (CYMBALTA) 60 MG capsule, Take 120 mg by mouth  at bedtime. , Disp: , Rfl:  .  famotidine (PEPCID) 40 MG tablet, Take 1 tablet (40 mg total) by mouth every evening., Disp: 30 tablet, Rfl: 1 .  fluticasone (FLONASE) 50 MCG/ACT nasal spray, Place 2 sprays into both nostrils daily., Disp: 16 g, Rfl: 2 .  glipiZIDE (GLUCOTROL) 5 MG tablet, Take 1 tablet (5 mg total) by mouth daily before breakfast., Disp: 90 tablet, Rfl: 0 .  Insulin Degludec-Liraglutide (XULTOPHY) 100-3.6 UNIT-MG/ML SOPN, Inject 16-24 Units into the skin at bedtime. Increase by 2 units every Monday and  Thursday until BG is </= 180. Pharmacy to dose. Do not exceed 24 units - if not controlled at 24 units, return to PCP., Disp: 15 mL, Rfl: 2 .  lamoTRIgine (LAMICTAL) 200 MG tablet, Take 200 mg by mouth at bedtime. , Disp: , Rfl:  .  LORazepam (ATIVAN) 0.5 MG tablet, Take 0.5 mg by mouth 3 (three) times daily as needed for anxiety. , Disp: , Rfl:  .  LYRICA 50 MG capsule, tk 1 capsule at bedtime, Disp: 30 capsule, Rfl: 2 .  metFORMIN (GLUCOPHAGE) 1000 MG tablet, Take 1 tablet (1,000 mg total) by mouth 2 (two) times daily with a meal., Disp: 180 tablet, Rfl: 0 .  ondansetron (ZOFRAN) 4 MG tablet, Take 1 tablet (4 mg total) by mouth every 8 (eight) hours as needed for nausea or vomiting., Disp: 20 tablet, Rfl: 0 .  pantoprazole (PROTONIX) 40 MG tablet, Take 1 tablet (40 mg total) by mouth daily., Disp: 30 tablet, Rfl: 0 .  propranolol ER (INDERAL LA) 60 MG 24 hr capsule, Take 60 mg by mouth at bedtime. , Disp: , Rfl:  .  QUEtiapine (SEROQUEL XR) 400 MG 24 hr tablet, TK 1 T PO QD AT 8PM ONE HOUR BEFORE OR AFTER A MEAL, Disp: , Rfl: 2 .  quinapril-hydrochlorothiazide (ACCURETIC) 20-12.5 MG tablet, Take 2 tablets by mouth daily., Disp: 180 tablet, Rfl: 0 .  rOPINIRole (REQUIP) 1 MG tablet, Take 1 mg by mouth at bedtime., Disp: , Rfl:  .  simvastatin (ZOCOR) 40 MG tablet, Take 1 tablet (40 mg total) by mouth at bedtime., Disp: 90 tablet, Rfl: 0 .  sucralfate (CARAFATE) 1 g tablet, Take 1 tablet (1 g total) by mouth 4 (four) times daily., Disp: 60 tablet, Rfl: 0 .  aspirin (ASPIRIN 81) 81 MG chewable tablet, Chew by mouth., Disp: , Rfl:   No Known Allergies  ROS  Ten systems reviewed and is negative except as mentioned in HPI  Objective  Vitals:   09/10/17 0922  BP: 100/64  Pulse: 96  Temp: 97.6 F (36.4 C)  TempSrc: Oral  SpO2: 98%  Weight: 231 lb 11.2 oz (105.1 kg)  Height: 5\' 11"  (1.803 m)   Body mass index is 32.32 kg/m.  Nursing Note and Vital Signs reviewed.  Physical  Exam  Constitutional: Patient appears well-developed and well-nourished. Obese No distress.  HEENT: head atraumatic, normocephalic, pupils equal and reactive to light, EOM's intact, TM's without erythema or bulging, mild frontal sinus tenderness bilaterally; no maxillary tenderness, neck supple without lymphadenopathy, oropharynx moderately erythematous and moist without exudate Cardiovascular: Normal rate, regular rhythm, S1/S2 present.  No murmur or rub heard. No BLE edema. Pulmonary/Chest: Effort normal and breath sounds clear. No respiratory distress or retractions. Psychiatric: Patient has a normal mood and affect. behavior is normal. Judgment and thought content normal.  Recent Results (from the past 2160 hour(s))  Urine Microalbumin w/creat. ratio     Status: None  Collection Time: 07/02/17  8:11 AM  Result Value Ref Range   Creatinine, Urine 54 20 - 320 mg/dL   Microalb, Ur <0.2 mg/dL    Comment: Reference Range Not established    Microalb Creat Ratio NOTE <30 mcg/mg creat    Comment: The microalbumin value is less than 0.2 mg/dL therefore we are unable to calculate excretion and/or creatinine ratio. . . The ADA defines abnormalities in albumin excretion as follows: Marland Kitchen Category         Result (mcg/mg creatinine) . Normal                    <30 Microalbuminuria         30-299  Clinical albuminuria   > OR = 300 . The ADA recommends that at least two of three specimens collected within a 3-6 month period be abnormal before considering a patient to be within a diagnostic category.   Lipid panel     Status: Abnormal   Collection Time: 07/02/17  8:11 AM  Result Value Ref Range   Cholesterol 164 <200 mg/dL   HDL 44 >40 mg/dL   Triglycerides 261 (H) <150 mg/dL   LDL Cholesterol (Calc) 86 mg/dL (calc)    Comment: Reference range: <100 . Desirable range <100 mg/dL for primary prevention;   <70 mg/dL for patients with CHD or diabetic patients  with > or = 2 CHD risk  factors. Marland Kitchen LDL-C is now calculated using the Martin-Hopkins  calculation, which is a validated novel method providing  better accuracy than the Friedewald equation in the  estimation of LDL-C.  Cresenciano Genre et al. Annamaria Helling. 8299;371(69): 2061-2068  (http://education.[facility].com/faq/FAQ164)    Total CHOL/HDL Ratio 3.7 <5.0 (calc)   Non-HDL Cholesterol (Calc) 120 <130 mg/dL (calc)    Comment: For patients with diabetes plus 1 major ASCVD risk  factor, treating to a non-HDL-C goal of <100 mg/dL  (LDL-C of <70 mg/dL) is considered a therapeutic  option.   COMPLETE METABOLIC PANEL WITH GFR     Status: Abnormal   Collection Time: 07/02/17  8:11 AM  Result Value Ref Range   Glucose, Bld 164 (H) 65 - 99 mg/dL    Comment: .            Fasting reference interval . For someone without known diabetes, a glucose value >125 mg/dL indicates that they may have diabetes and this should be confirmed with a follow-up test. .    BUN 13 7 - 25 mg/dL   Creat 1.00 0.70 - 1.33 mg/dL    Comment: For patients >69 years of age, the reference limit for Creatinine is approximately 13% higher for people identified as African-American. .    GFR, Est Non African American 84 > OR = 60 mL/min/1.57m2   GFR, Est African American 98 > OR = 60 mL/min/1.81m2   BUN/Creatinine Ratio NOT APPLICABLE 6 - 22 (calc)   Sodium 141 135 - 146 mmol/L   Potassium 4.9 3.5 - 5.3 mmol/L   Chloride 102 98 - 110 mmol/L   CO2 31 20 - 32 mmol/L   Calcium 9.6 8.6 - 10.3 mg/dL   Total Protein 6.5 6.1 - 8.1 g/dL   Albumin 4.3 3.6 - 5.1 g/dL   Globulin 2.2 1.9 - 3.7 g/dL (calc)   AG Ratio 2.0 1.0 - 2.5 (calc)   Total Bilirubin 0.3 0.2 - 1.2 mg/dL   Alkaline phosphatase (APISO) 60 40 - 115 U/L   AST 14 10 - 35 U/L  ALT 22 9 - 46 U/L  POCT Glucose (CBG)     Status: Abnormal   Collection Time: 07/06/17  1:32 PM  Result Value Ref Range   POC Glucose 298 (A) 70 - 99 mg/dl  POCT HgB A1C     Status: None   Collection Time:  07/06/17  1:38 PM  Result Value Ref Range   Hemoglobin A1C 7.8   COMPLETE METABOLIC PANEL WITH GFR     Status: Abnormal   Collection Time: 08/31/17  9:52 AM  Result Value Ref Range   Glucose, Bld 359 (H) 65 - 99 mg/dL    Comment: .            Fasting reference interval . For someone without known diabetes, a glucose value >125 mg/dL indicates that they may have diabetes and this should be confirmed with a follow-up test. .    BUN 19 7 - 25 mg/dL   Creat 0.90 0.70 - 1.33 mg/dL    Comment: For patients >72 years of age, the reference limit for Creatinine is approximately 13% higher for people identified as African-American. .    GFR, Est Non African American 95 > OR = 60 mL/min/1.54m2   GFR, Est African American 110 > OR = 60 mL/min/1.58m2   BUN/Creatinine Ratio NOT APPLICABLE 6 - 22 (calc)   Sodium 135 135 - 146 mmol/L   Potassium 4.2 3.5 - 5.3 mmol/L   Chloride 96 (L) 98 - 110 mmol/L   CO2 28 20 - 32 mmol/L   Calcium 9.2 8.6 - 10.3 mg/dL   Total Protein 6.7 6.1 - 8.1 g/dL   Albumin 4.5 3.6 - 5.1 g/dL   Globulin 2.2 1.9 - 3.7 g/dL (calc)   AG Ratio 2.0 1.0 - 2.5 (calc)   Total Bilirubin 0.4 0.2 - 1.2 mg/dL   Alkaline phosphatase (APISO) 71 40 - 115 U/L   AST 15 10 - 35 U/L   ALT 17 9 - 46 U/L  POCT glucose (manual entry)     Status: Abnormal   Collection Time: 08/31/17 10:09 AM  Result Value Ref Range   POC Glucose 335 (A) 70 - 99 mg/dl  Hepatitis C antibody screen     Status: None   Collection Time: 09/08/17  1:50 PM  Result Value Ref Range   Hepatitis C Ab NON-REACTIVE NON-REACTI   SIGNAL TO CUT-OFF 0.01 <1.00  HIV antibody     Status: None   Collection Time: 09/08/17  1:50 PM  Result Value Ref Range   HIV 1&2 Ab, 4th Generation NON-REACTIVE NON-REACTI    Comment: HIV-1 antigen and HIV-1/HIV-2 antibodies were not detected. There is no laboratory evidence of HIV infection. Marland Kitchen PLEASE NOTE: This information has been disclosed to you from records whose  confidentiality may be protected by state law.  If your state requires such protection, then the state law prohibits you from making any further disclosure of the information without the specific written consent of the person to whom it pertains, or as otherwise permitted by law. A general authorization for the release of medical or other information is NOT sufficient for this purpose. . For additional information please refer to http://education.[facility].com/faq/FAQ106 (This link is being provided for informational/ educational purposes only.) . Marland Kitchen The performance of this assay has not been clinically validated in patients less than 45 years old. .      Assessment & Plan  1. Upper respiratory tract infection, unspecified type - Cool Mist humidifier, drink plenty of fluids  and get plenty of rest. Tylenol PRN for body aches, fevers, or sore throat.  2. Sore throat - Diphenhyd-Hydrocort-Nystatin (FIRST-DUKES MOUTHWASH) SUSP; Use as directed 5 mLs in the mouth or throat 3 (three) times daily as needed (for throat pain.). Gargle and spit, do not swallow.  Dispense: 237 mL; Refill: 0  3. Cough - benzonatate (TESSALON PERLES) 100 MG capsule; Take 1 capsule (100 mg total) by mouth 3 (three) times daily as needed.  Dispense: 30 capsule; Refill: 0  4. Uncontrolled type 2 diabetes mellitus with hyperglycemia ([place]) - Will call pharmacy and assisted living facility today to discuss Xultophy taper again today. - Drink plenty of fluids, continue to eat a healthy, low carbohydrate diet.  -Red flags and when to present for emergency care or RTC including fever >101.22F, chest pain, shortness of breath, new/worsening/un-resolving symptoms, reviewed with patient at time of visit. Follow up and care instructions discussed and provided in AVS.

## 2017-09-10 NOTE — Telephone Encounter (Signed)
Spoke with Judson Roch at [facility], patient did miss dose last night due to error in new order, this has been sorted out and he is set to receive 20 units Xultophy.  Judson Roch will call back to check in Next week regarding next steps for titration vs follow up in office with PCP Dr. [name].

## 2017-09-17 ENCOUNTER — Encounter: Payer: Self-pay | Admitting: Family Medicine

## 2017-09-17 ENCOUNTER — Ambulatory Visit (INDEPENDENT_AMBULATORY_CARE_PROVIDER_SITE_OTHER): Payer: PPO | Admitting: Family Medicine

## 2017-09-17 DIAGNOSIS — E1165 Type 2 diabetes mellitus with hyperglycemia: Secondary | ICD-10-CM | POA: Diagnosis not present

## 2017-09-17 MED ORDER — INSULIN DEGLUDEC-LIRAGLUTIDE 100-3.6 UNIT-MG/ML ~~LOC~~ SOPN: 24.0000 [IU] | PEN_INJECTOR | Freq: Every day | SUBCUTANEOUS | 2 refills | Status: DC

## 2017-09-17 NOTE — Progress Notes (Signed)
Name: Bryan Flowers   MRN: [id number]    DOB: Apr 23, 1961   Date:09/17/2017       Progress Note  Subjective  Chief Complaint  Chief Complaint  Patient presents with  . Diabetes    LAst A1c was on 07/06/2017 and it was 7.8. Sugars this morning was 252 fasting.  sugars in the high 300s for the last 3 weeks. Pt states diet is still the same rarely any sweets.     Diabetes  He presents for his follow-up diabetic visit. He has type 2 diabetes mellitus. His disease course has been improving. There are no hypoglycemic associated symptoms. Pertinent negatives for hypoglycemia include no dizziness, headaches, nervousness/anxiousness or pallor. Pertinent negatives for diabetes include no chest pain, no fatigue, no foot paresthesias, no polydipsia and no polyuria. There are no hypoglycemic complications. Pertinent negatives for diabetic complications include no CVA, heart disease or peripheral neuropathy. Current diabetic treatment includes oral agent (dual therapy) and intensive insulin program. He is following a diabetic and generally healthy diet. He participates in exercise daily (walks every day). He monitors blood glucose at home 1-2 x per day. His breakfast blood glucose range is generally >200 mg/dl.      Past Medical History:  Diagnosis Date  . Anemia    iron deficiency  . Bipolar 1 disorder, manic, moderate ([place])   . Depression   . Diabetes mellitus without complication ([place])   . GERD (gastroesophageal reflux disease)   . Hyperlipidemia   . Hypertension   . Insomnia   . Parkinson disease ([place])   . Peptic ulcer disease with hemorrhage     Past Surgical History:  Procedure Laterality Date  . ESOPHAGOGASTRODUODENOSCOPY Left 03/16/2016   Procedure: ESOPHAGOGASTRODUODENOSCOPY (EGD);  Surgeon: Manus Gunning, MD;  Location: [facility];  Service: Gastroenterology;  Laterality: Left;  . FOOT FRACTURE SURGERY Right     Family History  Problem Relation Age of Onset  . Cancer  Mother   . Heart disease Father   . Arthritis Brother     Social History   Socioeconomic History  . Marital status: Married    Spouse name: Not on file  . Number of children: 0  . Years of education: Not on file  . Highest education level: Not on file  Social Needs  . Financial resource strain: Not hard at all  . Food insecurity - worry: Never true  . Food insecurity - inability: Never true  . Transportation needs - medical: No  . Transportation needs - non-medical: No  Occupational History    Employer: DISABLED  Tobacco Use  . Smoking status: Never Smoker  . Smokeless tobacco: Never Used  . Tobacco comment: Non-smoker  Substance and Sexual Activity  . Alcohol use: No    Alcohol/week: 0.0 oz  . Drug use: No  . Sexual activity: No    Partners: Female  Other Topics Concern  . Not on file  Social History Narrative  . Not on file     Current Outpatient Medications:  .  acetaminophen (TYLENOL) 500 MG tablet, Take 1,000 mg by mouth every 6 (six) hours as needed (pain)., Disp: , Rfl:  .  Armodafinil 250 MG tablet, Take by mouth., Disp: , Rfl:  .  aspirin (ASPIRIN 81) 81 MG chewable tablet, Chew by mouth., Disp: , Rfl:  .  b complex vitamins capsule, Take 1 capsule by mouth daily., Disp: , Rfl:  .  benzonatate (TESSALON PERLES) 100 MG capsule, Take 1 capsule (100 mg  total) by mouth 3 (three) times daily as needed., Disp: 30 capsule, Rfl: 0 .  carbidopa-levodopa (SINEMET CR) 50-200 MG per tablet, Take 1 tablet by mouth 3 (three) times daily. , Disp: , Rfl:  .  Diphenhyd-Hydrocort-Nystatin (FIRST-DUKES MOUTHWASH) SUSP, Use as directed 5 mLs in the mouth or throat 3 (three) times daily as needed (for throat pain.). Gargle and spit, do not swallow., Disp: 237 mL, Rfl: 0 .  DULoxetine (CYMBALTA) 60 MG capsule, Take 120 mg by mouth at bedtime. , Disp: , Rfl:  .  famotidine (PEPCID) 40 MG tablet, Take 1 tablet (40 mg total) by mouth every evening., Disp: 30 tablet, Rfl: 1 .   fluticasone (FLONASE) 50 MCG/ACT nasal spray, Place 2 sprays into both nostrils daily., Disp: 16 g, Rfl: 2 .  glipiZIDE (GLUCOTROL) 5 MG tablet, Take 1 tablet (5 mg total) by mouth daily before breakfast., Disp: 90 tablet, Rfl: 0 .  Insulin Degludec-Liraglutide (XULTOPHY) 100-3.6 UNIT-MG/ML SOPN, Inject 16-24 Units into the skin at bedtime. Increase by 2 units every Monday and Thursday until BG is </= 180. Pharmacy to dose. Do not exceed 24 units - if not controlled at 24 units, return to PCP., Disp: 15 mL, Rfl: 2 .  lamoTRIgine (LAMICTAL) 200 MG tablet, Take 200 mg by mouth at bedtime. , Disp: , Rfl:  .  LORazepam (ATIVAN) 0.5 MG tablet, Take 0.5 mg by mouth 3 (three) times daily as needed for anxiety. , Disp: , Rfl:  .  LYRICA 50 MG capsule, tk 1 capsule at bedtime, Disp: 30 capsule, Rfl: 2 .  metFORMIN (GLUCOPHAGE) 1000 MG tablet, Take 1 tablet (1,000 mg total) by mouth 2 (two) times daily with a meal., Disp: 180 tablet, Rfl: 0 .  ondansetron (ZOFRAN) 4 MG tablet, Take 1 tablet (4 mg total) by mouth every 8 (eight) hours as needed for nausea or vomiting., Disp: 20 tablet, Rfl: 0 .  pantoprazole (PROTONIX) 40 MG tablet, Take 1 tablet (40 mg total) by mouth daily., Disp: 30 tablet, Rfl: 0 .  propranolol ER (INDERAL LA) 60 MG 24 hr capsule, Take 60 mg by mouth at bedtime. , Disp: , Rfl:  .  QUEtiapine (SEROQUEL XR) 400 MG 24 hr tablet, TK 1 T PO QD AT 8PM ONE HOUR BEFORE OR AFTER A MEAL, Disp: , Rfl: 2 .  quinapril-hydrochlorothiazide (ACCURETIC) 20-12.5 MG tablet, Take 2 tablets by mouth daily., Disp: 180 tablet, Rfl: 0 .  rOPINIRole (REQUIP) 1 MG tablet, Take 1 mg by mouth at bedtime., Disp: , Rfl:  .  simvastatin (ZOCOR) 40 MG tablet, Take 1 tablet (40 mg total) by mouth at bedtime., Disp: 90 tablet, Rfl: 0 .  sucralfate (CARAFATE) 1 g tablet, Take 1 tablet (1 g total) by mouth 4 (four) times daily., Disp: 60 tablet, Rfl: 0  No Known Allergies   Review of Systems  Constitutional: Negative for  fatigue.  Cardiovascular: Negative for chest pain.  Skin: Negative for pallor.  Neurological: Negative for dizziness and headaches.  Endo/Heme/Allergies: Negative for polydipsia.  Psychiatric/Behavioral: The patient is not nervous/anxious.       Objective  Vitals:   09/17/17 1036  BP: 122/74  Pulse: 99  Resp: 16  Temp: 97.9 F (36.6 C)  TempSrc: Oral  SpO2: 99%  Weight: 233 lb 6.4 oz (105.9 kg)  Height: 5\' 11"  (1.803 m)    Physical Exam  Constitutional: He is well-developed, well-nourished, and in no distress.  HENT:  Head: Normocephalic.  Cardiovascular: Normal rate, regular rhythm,  S1 normal and S2 normal.  Murmur heard.  Systolic murmur is present with a grade of 2/6. Pulmonary/Chest: Effort normal and breath sounds normal.  Nursing note and vitals reviewed.    Recent Results (from the past 2160 hour(s))  Urine Microalbumin w/creat. ratio     Status: None   Collection Time: 07/02/17  8:11 AM  Result Value Ref Range   Creatinine, Urine 54 20 - 320 mg/dL   Microalb, Ur <0.2 mg/dL    Comment: Reference Range Not established    Microalb Creat Ratio NOTE <30 mcg/mg creat    Comment: The microalbumin value is less than 0.2 mg/dL therefore we are unable to calculate excretion and/or creatinine ratio. . . The ADA defines abnormalities in albumin excretion as follows: Marland Kitchen Category         Result (mcg/mg creatinine) . Normal                    <30 Microalbuminuria         30-299  Clinical albuminuria   > OR = 300 . The ADA recommends that at least two of three specimens collected within a 3-6 month period be abnormal before considering a patient to be within a diagnostic category.   Lipid panel     Status: Abnormal   Collection Time: 07/02/17  8:11 AM  Result Value Ref Range   Cholesterol 164 <200 mg/dL   HDL 44 >40 mg/dL   Triglycerides 261 (H) <150 mg/dL   LDL Cholesterol (Calc) 86 mg/dL (calc)    Comment: Reference range: <100 . Desirable range  <100 mg/dL for primary prevention;   <70 mg/dL for patients with CHD or diabetic patients  with > or = 2 CHD risk factors. Marland Kitchen LDL-C is now calculated using the Martin-Hopkins  calculation, which is a validated novel method providing  better accuracy than the Friedewald equation in the  estimation of LDL-C.  Cresenciano Genre et al. Annamaria Helling. 4098;119(14): 2061-2068  (http://education.[facility].com/faq/FAQ164)    Total CHOL/HDL Ratio 3.7 <5.0 (calc)   Non-HDL Cholesterol (Calc) 120 <130 mg/dL (calc)    Comment: For patients with diabetes plus 1 major ASCVD risk  factor, treating to a non-HDL-C goal of <100 mg/dL  (LDL-C of <70 mg/dL) is considered a therapeutic  option.   COMPLETE METABOLIC PANEL WITH GFR     Status: Abnormal   Collection Time: 07/02/17  8:11 AM  Result Value Ref Range   Glucose, Bld 164 (H) 65 - 99 mg/dL    Comment: .            Fasting reference interval . For someone without known diabetes, a glucose value >125 mg/dL indicates that they may have diabetes and this should be confirmed with a follow-up test. .    BUN 13 7 - 25 mg/dL   Creat 1.00 0.70 - 1.33 mg/dL    Comment: For patients >92 years of age, the reference limit for Creatinine is approximately 13% higher for people identified as African-American. .    GFR, Est Non African American 84 > OR = 60 mL/min/1.26m2   GFR, Est African American 98 > OR = 60 mL/min/1.54m2   BUN/Creatinine Ratio NOT APPLICABLE 6 - 22 (calc)   Sodium 141 135 - 146 mmol/L   Potassium 4.9 3.5 - 5.3 mmol/L   Chloride 102 98 - 110 mmol/L   CO2 31 20 - 32 mmol/L   Calcium 9.6 8.6 - 10.3 mg/dL   Total Protein 6.5 6.1 - 8.1  g/dL   Albumin 4.3 3.6 - 5.1 g/dL   Globulin 2.2 1.9 - 3.7 g/dL (calc)   AG Ratio 2.0 1.0 - 2.5 (calc)   Total Bilirubin 0.3 0.2 - 1.2 mg/dL   Alkaline phosphatase (APISO) 60 40 - 115 U/L   AST 14 10 - 35 U/L   ALT 22 9 - 46 U/L  POCT Glucose (CBG)     Status: Abnormal   Collection Time: 07/06/17  1:32 PM   Result Value Ref Range   POC Glucose 298 (A) 70 - 99 mg/dl  POCT HgB A1C     Status: None   Collection Time: 07/06/17  1:38 PM  Result Value Ref Range   Hemoglobin A1C 7.8   COMPLETE METABOLIC PANEL WITH GFR     Status: Abnormal   Collection Time: 08/31/17  9:52 AM  Result Value Ref Range   Glucose, Bld 359 (H) 65 - 99 mg/dL    Comment: .            Fasting reference interval . For someone without known diabetes, a glucose value >125 mg/dL indicates that they may have diabetes and this should be confirmed with a follow-up test. .    BUN 19 7 - 25 mg/dL   Creat 0.90 0.70 - 1.33 mg/dL    Comment: For patients >1 years of age, the reference limit for Creatinine is approximately 13% higher for people identified as African-American. .    GFR, Est Non African American 95 > OR = 60 mL/min/1.41m2   GFR, Est African American 110 > OR = 60 mL/min/1.14m2   BUN/Creatinine Ratio NOT APPLICABLE 6 - 22 (calc)   Sodium 135 135 - 146 mmol/L   Potassium 4.2 3.5 - 5.3 mmol/L   Chloride 96 (L) 98 - 110 mmol/L   CO2 28 20 - 32 mmol/L   Calcium 9.2 8.6 - 10.3 mg/dL   Total Protein 6.7 6.1 - 8.1 g/dL   Albumin 4.5 3.6 - 5.1 g/dL   Globulin 2.2 1.9 - 3.7 g/dL (calc)   AG Ratio 2.0 1.0 - 2.5 (calc)   Total Bilirubin 0.4 0.2 - 1.2 mg/dL   Alkaline phosphatase (APISO) 71 40 - 115 U/L   AST 15 10 - 35 U/L   ALT 17 9 - 46 U/L  POCT glucose (manual entry)     Status: Abnormal   Collection Time: 08/31/17 10:09 AM  Result Value Ref Range   POC Glucose 335 (A) 70 - 99 mg/dl  Hepatitis C antibody screen     Status: None   Collection Time: 09/08/17  1:50 PM  Result Value Ref Range   Hepatitis C Ab NON-REACTIVE NON-REACTI   SIGNAL TO CUT-OFF 0.01 <1.00  HIV antibody     Status: None   Collection Time: 09/08/17  1:50 PM  Result Value Ref Range   HIV 1&2 Ab, 4th Generation NON-REACTIVE NON-REACTI    Comment: HIV-1 antigen and HIV-1/HIV-2 antibodies were not detected. There is no laboratory  evidence of HIV infection. Marland Kitchen PLEASE NOTE: This information has been disclosed to you from records whose confidentiality may be protected by state law.  If your state requires such protection, then the state law prohibits you from making any further disclosure of the information without the specific written consent of the person to whom it pertains, or as otherwise permitted by law. A general authorization for the release of medical or other information is NOT sufficient for this purpose. . For additional information please refer  to http://education.[facility].com/faq/FAQ106 (This link is being provided for informational/ educational purposes only.) . Marland Kitchen The performance of this assay has not been clinically validated in patients less than 66 years old. .      Assessment & Plan  1. Uncontrolled type 2 diabetes mellitus with hyperglycemia (HCC) Uncontrolled fasting a.m. glucose readings in the high 200s, advised to increase old 5-24 units every night, avoid eating snacks or meals late at night, repeat A1c in 3 weeks. - Insulin Degludec-Liraglutide (XULTOPHY) 100-3.6 UNIT-MG/ML SOPN; Inject 24 Units into the skin at bedtime.  Dispense: 15 mL; Refill: 2   Henny Strauch [facility] 09/17/2017 10:43 AM

## 2017-09-30 ENCOUNTER — Other Ambulatory Visit: Payer: Self-pay

## 2017-09-30 MED ORDER — LYRICA 50 MG PO CAPS: ORAL_CAPSULE | ORAL | 2 refills | Status: DC

## 2017-09-30 NOTE — Telephone Encounter (Signed)
Refill request for general medication. Lyrica to [facility].   Last office visit: 09/17/2017  Follow up on: 10/08/2017

## 2017-10-04 ENCOUNTER — Encounter: Payer: PPO | Admitting: Family Medicine

## 2017-10-08 ENCOUNTER — Ambulatory Visit: Payer: PPO | Admitting: Family Medicine

## 2017-10-11 ENCOUNTER — Encounter: Payer: Self-pay | Admitting: Family Medicine

## 2017-10-11 ENCOUNTER — Ambulatory Visit (INDEPENDENT_AMBULATORY_CARE_PROVIDER_SITE_OTHER): Payer: PPO | Admitting: Family Medicine

## 2017-10-11 VITALS — BP 120/72 | HR 105 | Temp 97.8°F | Resp 14 | Ht 71.0 in | Wt 238.1 lb

## 2017-10-11 DIAGNOSIS — E7800 Pure hypercholesterolemia, unspecified: Secondary | ICD-10-CM

## 2017-10-11 DIAGNOSIS — E1165 Type 2 diabetes mellitus with hyperglycemia: Secondary | ICD-10-CM | POA: Diagnosis not present

## 2017-10-11 DIAGNOSIS — I10 Essential (primary) hypertension: Secondary | ICD-10-CM

## 2017-10-11 LAB — POCT GLYCOSYLATED HEMOGLOBIN (HGB A1C): Hemoglobin A1C: 8.5

## 2017-10-11 MED ORDER — QUINAPRIL-HYDROCHLOROTHIAZIDE 20-12.5 MG PO TABS: 2.0000 | ORAL_TABLET | Freq: Every day | ORAL | 0 refills | Status: DC

## 2017-10-11 MED ORDER — SIMVASTATIN 40 MG PO TABS: 40.0000 mg | ORAL_TABLET | Freq: Every day | ORAL | 0 refills | Status: DC

## 2017-10-11 MED ORDER — INSULIN DEGLUDEC-LIRAGLUTIDE 100-3.6 UNIT-MG/ML ~~LOC~~ SOPN: 30.0000 [IU] | PEN_INJECTOR | Freq: Every day | SUBCUTANEOUS | 2 refills | Status: DC

## 2017-10-11 MED ORDER — GLIPIZIDE 5 MG PO TABS: 5.0000 mg | ORAL_TABLET | Freq: Every day | ORAL | 0 refills | Status: DC

## 2017-10-11 MED ORDER — METFORMIN HCL 1000 MG PO TABS: 1000.0000 mg | ORAL_TABLET | Freq: Two times a day (BID) | ORAL | 0 refills | Status: DC

## 2017-10-11 NOTE — Progress Notes (Signed)
Name: Bryan Flowers   MRN: [id number]    DOB: 04-08-1961   Date:10/11/2017       Progress Note  Subjective  Chief Complaint  Chief Complaint  Patient presents with  . Diabetes    Sugar readings in the morning 201-264 fasting and Sugars reading at night has been 300-400 non fasting. Pt states he takes his medicine the same everyday.     Diabetes  He presents for his follow-up diabetic visit. He has type 2 diabetes mellitus. His disease course has been worsening. Pertinent negatives for hypoglycemia include no headaches, nervousness/anxiousness, speech difficulty or sweats. Pertinent negatives for diabetes include no chest pain, no fatigue, no foot paresthesias, no polydipsia and no polyuria. There are no hypoglycemic complications. Pertinent negatives for diabetic complications include no CVA, heart disease or peripheral neuropathy. Current diabetic treatment includes oral agent (dual therapy) and intensive insulin program. His weight is stable. He is following a generally healthy and diabetic diet. He participates in exercise daily (walks 30 minutes every day). He monitors blood glucose at home 1-2 x per day. His breakfast blood glucose range is generally >200 mg/dl. His bedtime blood glucose range is generally >200 mg/dl. An ACE inhibitor/angiotensin II receptor blocker is being taken.  Hyperlipidemia  This is a chronic problem. The problem is uncontrolled. Recent lipid tests were reviewed and are high. Exacerbating diseases include obesity. Pertinent negatives include no chest pain, leg pain, myalgias or shortness of breath. Current antihyperlipidemic treatment includes statins.  Hypertension  This is a chronic problem. The problem is unchanged. The problem is controlled. Pertinent negatives include no chest pain, headaches, palpitations, shortness of breath or sweats. Past treatments include ACE inhibitors and diuretics. There is no history of CVA.     Past Medical History:  Diagnosis Date   . Anemia    iron deficiency  . Bipolar 1 disorder, manic, moderate ([place])   . Depression   . Diabetes mellitus without complication ([place])   . GERD (gastroesophageal reflux disease)   . Hyperlipidemia   . Hypertension   . Insomnia   . Parkinson disease ([place])   . Peptic ulcer disease with hemorrhage     Past Surgical History:  Procedure Laterality Date  . ESOPHAGOGASTRODUODENOSCOPY Left 03/16/2016   Procedure: ESOPHAGOGASTRODUODENOSCOPY (EGD);  Surgeon: Manus Gunning, MD;  Location: [facility];  Service: Gastroenterology;  Laterality: Left;  . FOOT FRACTURE SURGERY Right     Family History  Problem Relation Age of Onset  . Cancer Mother   . Heart disease Father   . Arthritis Brother     Social History   Socioeconomic History  . Marital status: Married    Spouse name: Not on file  . Number of children: 0  . Years of education: Not on file  . Highest education level: Not on file  Social Needs  . Financial resource strain: Not hard at all  . Food insecurity - worry: Never true  . Food insecurity - inability: Never true  . Transportation needs - medical: No  . Transportation needs - non-medical: No  Occupational History    Employer: DISABLED  Tobacco Use  . Smoking status: Never Smoker  . Smokeless tobacco: Never Used  . Tobacco comment: Non-smoker  Substance and Sexual Activity  . Alcohol use: No    Alcohol/week: 0.0 oz  . Drug use: No  . Sexual activity: No    Partners: Female  Other Topics Concern  . Not on file  Social History Narrative  .  Not on file     Current Outpatient Medications:  .  acetaminophen (TYLENOL) 500 MG tablet, Take 1,000 mg by mouth every 6 (six) hours as needed (pain)., Disp: , Rfl:  .  Armodafinil 250 MG tablet, Take by mouth., Disp: , Rfl:  .  aspirin (ASPIRIN 81) 81 MG chewable tablet, Chew by mouth., Disp: , Rfl:  .  b complex vitamins capsule, Take 1 capsule by mouth daily., Disp: , Rfl:  .  benzonatate (TESSALON  PERLES) 100 MG capsule, Take 1 capsule (100 mg total) by mouth 3 (three) times daily as needed., Disp: 30 capsule, Rfl: 0 .  carbidopa-levodopa (SINEMET CR) 50-200 MG per tablet, Take 1 tablet by mouth 3 (three) times daily. , Disp: , Rfl:  .  Diphenhyd-Hydrocort-Nystatin (FIRST-DUKES MOUTHWASH) SUSP, Use as directed 5 mLs in the mouth or throat 3 (three) times daily as needed (for throat pain.). Gargle and spit, do not swallow., Disp: 237 mL, Rfl: 0 .  DULoxetine (CYMBALTA) 60 MG capsule, Take 120 mg by mouth at bedtime. , Disp: , Rfl:  .  famotidine (PEPCID) 40 MG tablet, Take 1 tablet (40 mg total) by mouth every evening., Disp: 30 tablet, Rfl: 1 .  fluticasone (FLONASE) 50 MCG/ACT nasal spray, Place 2 sprays into both nostrils daily., Disp: 16 g, Rfl: 2 .  glipiZIDE (GLUCOTROL) 5 MG tablet, Take 1 tablet (5 mg total) by mouth daily before breakfast., Disp: 90 tablet, Rfl: 0 .  Insulin Degludec-Liraglutide (XULTOPHY) 100-3.6 UNIT-MG/ML SOPN, Inject 24 Units into the skin at bedtime., Disp: 15 mL, Rfl: 2 .  lamoTRIgine (LAMICTAL) 200 MG tablet, Take 200 mg by mouth at bedtime. , Disp: , Rfl:  .  LORazepam (ATIVAN) 0.5 MG tablet, Take 0.5 mg by mouth 3 (three) times daily as needed for anxiety. , Disp: , Rfl:  .  LYRICA 50 MG capsule, tk 1 capsule at bedtime, Disp: 30 capsule, Rfl: 2 .  metFORMIN (GLUCOPHAGE) 1000 MG tablet, Take 1 tablet (1,000 mg total) by mouth 2 (two) times daily with a meal., Disp: 180 tablet, Rfl: 0 .  pantoprazole (PROTONIX) 40 MG tablet, Take 1 tablet (40 mg total) by mouth daily., Disp: 30 tablet, Rfl: 0 .  propranolol ER (INDERAL LA) 60 MG 24 hr capsule, Take 60 mg by mouth at bedtime. , Disp: , Rfl:  .  QUEtiapine (SEROQUEL XR) 400 MG 24 hr tablet, TK 1 T PO QD AT 8PM ONE HOUR BEFORE OR AFTER A MEAL, Disp: , Rfl: 2 .  rOPINIRole (REQUIP) 1 MG tablet, Take 2 mg by mouth at bedtime. , Disp: , Rfl:  .  simvastatin (ZOCOR) 40 MG tablet, Take 1 tablet (40 mg total) by mouth at  bedtime., Disp: 90 tablet, Rfl: 0 .  sucralfate (CARAFATE) 1 g tablet, Take 1 tablet (1 g total) by mouth 4 (four) times daily., Disp: 60 tablet, Rfl: 0 .  ondansetron (ZOFRAN) 4 MG tablet, Take 1 tablet (4 mg total) by mouth every 8 (eight) hours as needed for nausea or vomiting., Disp: 20 tablet, Rfl: 0 .  quinapril-hydrochlorothiazide (ACCURETIC) 20-12.5 MG tablet, Take 2 tablets by mouth daily., Disp: 180 tablet, Rfl: 0  No Known Allergies   Review of Systems  Constitutional: Negative for fatigue.  Respiratory: Negative for shortness of breath.   Cardiovascular: Negative for chest pain and palpitations.  Musculoskeletal: Negative for myalgias.  Neurological: Negative for speech difficulty and headaches.  Endo/Heme/Allergies: Negative for polydipsia.  Psychiatric/Behavioral: The patient is not nervous/anxious.  Objective  Vitals:   10/11/17 1458  BP: 120/72  Pulse: (!) 105  Resp: 14  Temp: 97.8 F (36.6 C)  TempSrc: Oral  SpO2: 96%  Weight: 238 lb 1.6 oz (108 kg)  Height: 5\' 11"  (1.803 m)    Physical Exam  Constitutional: He is oriented to person, place, and time and well-developed, well-nourished, and in no distress.  HENT:  Head: Normocephalic and atraumatic.  Cardiovascular: Normal rate, regular rhythm and normal heart sounds.  No murmur heard. Pulmonary/Chest: Effort normal and breath sounds normal. He has no wheezes.  Abdominal: Soft. Bowel sounds are normal.  Neurological: He is alert and oriented to person, place, and time.  Psychiatric: Mood, memory, affect and judgment normal.  Nursing note and vitals reviewed.    Assessment & Plan  1. Uncontrolled type 2 diabetes mellitus with hyperglycemia (HCC) Point-of-care A1c is 8.5%, considered poor diabetic control, advised to increase Xultophy 30 units at bedtime by going up 2 units every 3-4 days, continue on glipizide and metformin - POCT HgB A1C - Insulin Degludec-Liraglutide (XULTOPHY) 100-3.6  UNIT-MG/ML SOPN; Inject 30 Units into the skin at bedtime.  Dispense: 15 mL; Refill: 2 - glipiZIDE (GLUCOTROL) 5 MG tablet; Take 1 tablet (5 mg total) by mouth daily before breakfast.  Dispense: 90 tablet; Refill: 0 - metFORMIN (GLUCOPHAGE) 1000 MG tablet; Take 1 tablet (1,000 mg total) by mouth 2 (two) times daily with a meal.  Dispense: 180 tablet; Refill: 0  2. Pure hypercholesterolemia  - simvastatin (ZOCOR) 40 MG tablet; Take 1 tablet (40 mg total) by mouth at bedtime.  Dispense: 90 tablet; Refill: 0 - Lipid panel  3. Essential hypertension  - quinapril-hydrochlorothiazide (ACCURETIC) 20-12.5 MG tablet; Take 2 tablets by mouth daily.  Dispense: 180 tablet; Refill: 0   Malicia Blasdel [facility] 10/11/2017 3:15 PM

## 2017-10-25 ENCOUNTER — Ambulatory Visit: Payer: PPO | Admitting: Family Medicine

## 2017-10-26 ENCOUNTER — Ambulatory Visit (INDEPENDENT_AMBULATORY_CARE_PROVIDER_SITE_OTHER): Payer: PPO | Admitting: Family Medicine

## 2017-10-26 ENCOUNTER — Encounter: Payer: Self-pay | Admitting: Family Medicine

## 2017-10-26 ENCOUNTER — Ambulatory Visit: Payer: PPO | Admitting: Family Medicine

## 2017-10-26 ENCOUNTER — Telehealth: Payer: Self-pay | Admitting: Family Medicine

## 2017-10-26 VITALS — BP 118/64 | HR 97 | Temp 98.6°F | Resp 18 | Ht 71.0 in | Wt 239.7 lb

## 2017-10-26 DIAGNOSIS — E1165 Type 2 diabetes mellitus with hyperglycemia: Secondary | ICD-10-CM | POA: Diagnosis not present

## 2017-10-26 MED ORDER — EMPAGLIFLOZIN-METFORMIN HCL 12.5-1000 MG PO TABS: 1.0000 | ORAL_TABLET | Freq: Two times a day (BID) | ORAL | 0 refills | Status: DC

## 2017-10-26 MED ORDER — INSULIN DEGLUDEC-LIRAGLUTIDE 100-3.6 UNIT-MG/ML ~~LOC~~ SOPN: 34.0000 [IU] | PEN_INJECTOR | Freq: Every day | SUBCUTANEOUS | 0 refills | Status: DC

## 2017-10-26 NOTE — Progress Notes (Signed)
Name: Bryan Flowers   MRN: [id number]    DOB: [birth year]   Date:10/26/2017       Progress Note  Subjective  Chief Complaint  Chief Complaint  Patient presents with  . Diabetes    Blood sugar running over 200    HPI  He presents for his follow-up diabetic visit. He has type 2 diabetes mellitus. His disease course has been worsening. Pertinent negatives for hypoglycemia include no headaches, nervousness/anxiousness, speech difficulty or sweats. Pertinent negatives for diabetes include no chest pain, no foot paresthesias, no polyphagia and no polyuria. Endorses fatigue, polydipsia.There are no hypoglycemic complications. Pertinent negatives for diabetic complications include no CVA, heart disease or peripheral neuropathy.  His weight is stable. He is following a generally healthy and diabetic diet. He participates in exercise daily (walks 30 minutes every day). An ACE inhibitor/angiotensin II receptor blocker is being taken.    He is here today because his BG's have still not been well controlled despite PCP increasing Xultophy to 30 units daily.  Fasting BG's range from 167-325, average is 230-250.  Last A1C was 10/11/17 and was 8.5%.  He also takes Metformin 1000mg  daily and glipizide 5mg  daily.  He lives in assisted living at the [facility] and he is unable to titrate his Claris Che himself as the staff administers his medications.  Instead, there must be a new order placed every time there is a dose change - we will work closely with [facility] to titrate up on this medication.  We will STOP glipizide today.  Discussed options for alternative oral therapies, and it is agreed that we will start synjardy 12.02-999 BID.  We will also increase Xultophy to 34 units daily.  We will call pharmacy every 2-3 days to check fasting BG's and provide orders to titrate up on Xultophy.  Patient Active Problem List   Diagnosis Date Noted  . Vitamin D deficiency 09/10/2017  . GERD  (gastroesophageal reflux disease) 09/10/2017  . Claudication ([place]) 07/16/2017  . Encounter for examination for driving license [id number]  . Annual physical exam 10/01/2016  . Bilateral leg pain 08/20/2016  . Musculoskeletal leg pain, left 07/21/2016  . Fatigue 05/18/2016  . Sore throat 04/16/2016  . Iron deficiency anemia 03/30/2016  . Normocytic anemia due to blood loss 03/27/2016  . Anemia associated with acute blood loss 03/24/2016  . Multiple gastric ulcers   . Duodenal ulcer   . Anemia 03/15/2016  . GI bleed 03/15/2016  . Hyponatremia 03/15/2016  . Acute blood loss anemia 03/15/2016  . Parkinson disease ([place])   . Hypertension   . Subacute maxillary sinusitis 02/27/2016  . Anxiety 08/19/2015  . Type 2 diabetes mellitus ([place]) 08/19/2015  . Abnormal ECG 04/09/2015  . Back ache 04/09/2015  . Clinical depression 04/09/2015  . Uncontrolled type 2 diabetes mellitus ([place]) 04/09/2015  . Obstructive sleep apnea 04/09/2015  . HDL deficiency 04/09/2015  . Hearing loss, sensorineural, combined types 04/09/2015  . HLD (hyperlipidemia) 04/09/2015  . BP (high blood pressure) 04/09/2015  . Idiopathic Parkinson's disease ([place]) 04/09/2015  . Vocal cord atrophy 05/29/2014  . Fibrositis 03/15/2014  . Fibromyalgia 03/15/2014  . Bipolar I disorder, single manic episode, moderate ([place]) 03/17/2010  . Testicular hypofunction 05/17/2009  . Migraine with aura 05/14/2009  . Anxiety state 03/19/2008  . Cannot sleep 03/05/2008  . Reflux esophagitis 07/12/2007    Past Surgical History:  Procedure Laterality Date  . ESOPHAGOGASTRODUODENOSCOPY Left 03/16/2016   Procedure: ESOPHAGOGASTRODUODENOSCOPY (EGD);  Surgeon: Manus Gunning, MD;  Location: [facility];  Service: Gastroenterology;  Laterality: Left;  . FOOT FRACTURE SURGERY Right     Family History  Problem Relation Age of Onset  . Cancer Mother   . Heart disease Father   . Arthritis Brother     Social History    Socioeconomic History  . Marital status: Married    Spouse name: Not on file  . Number of children: 0  . Years of education: Not on file  . Highest education level: Not on file  Social Needs  . Financial resource strain: Not hard at all  . Food insecurity - worry: Never true  . Food insecurity - inability: Never true  . Transportation needs - medical: No  . Transportation needs - non-medical: No  Occupational History    Employer: DISABLED  Tobacco Use  . Smoking status: Never Smoker  . Smokeless tobacco: Never Used  . Tobacco comment: Non-smoker  Substance and Sexual Activity  . Alcohol use: No    Alcohol/week: 0.0 oz  . Drug use: No  . Sexual activity: No    Partners: Female  Other Topics Concern  . Not on file  Social History Narrative  . Not on file     Current Outpatient Medications:  .  acetaminophen (TYLENOL) 500 MG tablet, Take 1,000 mg by mouth every 6 (six) hours as needed (pain)., Disp: , Rfl:  .  Armodafinil 250 MG tablet, Take by mouth., Disp: , Rfl:  .  aspirin (ASPIRIN 81) 81 MG chewable tablet, Chew by mouth., Disp: , Rfl:  .  b complex vitamins capsule, Take 1 capsule by mouth daily., Disp: , Rfl:  .  carbidopa-levodopa (SINEMET CR) 50-200 MG per tablet, Take 1 tablet by mouth 3 (three) times daily. , Disp: , Rfl:  .  DULoxetine (CYMBALTA) 60 MG capsule, Take 120 mg by mouth at bedtime. , Disp: , Rfl:  .  famotidine (PEPCID) 40 MG tablet, Take 1 tablet (40 mg total) by mouth every evening., Disp: 30 tablet, Rfl: 1 .  fluticasone (FLONASE) 50 MCG/ACT nasal spray, Place 2 sprays into both nostrils daily., Disp: 16 g, Rfl: 2 .  glipiZIDE (GLUCOTROL) 5 MG tablet, Take 1 tablet (5 mg total) by mouth daily before breakfast., Disp: 90 tablet, Rfl: 0 .  Insulin Degludec-Liraglutide (XULTOPHY) 100-3.6 UNIT-MG/ML SOPN, Inject 30 Units into the skin at bedtime., Disp: 15 mL, Rfl: 2 .  lamoTRIgine (LAMICTAL) 200 MG tablet, Take 200 mg by mouth at bedtime. , Disp: ,  Rfl:  .  LORazepam (ATIVAN) 0.5 MG tablet, Take 0.5 mg by mouth 3 (three) times daily as needed for anxiety. , Disp: , Rfl:  .  LYRICA 50 MG capsule, tk 1 capsule at bedtime, Disp: 30 capsule, Rfl: 2 .  metFORMIN (GLUCOPHAGE) 1000 MG tablet, Take 1 tablet (1,000 mg total) by mouth 2 (two) times daily with a meal., Disp: 180 tablet, Rfl: 0 .  ondansetron (ZOFRAN) 4 MG tablet, Take 1 tablet (4 mg total) by mouth every 8 (eight) hours as needed for nausea or vomiting., Disp: 20 tablet, Rfl: 0 .  pantoprazole (PROTONIX) 40 MG tablet, Take 1 tablet (40 mg total) by mouth daily., Disp: 30 tablet, Rfl: 0 .  propranolol ER (INDERAL LA) 60 MG 24 hr capsule, Take 60 mg by mouth at bedtime. , Disp: , Rfl:  .  QUEtiapine (SEROQUEL XR) 400 MG 24 hr tablet, TK 1 T PO QD AT 8PM ONE HOUR BEFORE OR  AFTER A MEAL, Disp: , Rfl: 2 .  quinapril-hydrochlorothiazide (ACCURETIC) 20-12.5 MG tablet, Take 2 tablets by mouth daily., Disp: 180 tablet, Rfl: 0 .  rOPINIRole (REQUIP) 1 MG tablet, Take 2 mg by mouth at bedtime. , Disp: , Rfl:  .  simvastatin (ZOCOR) 40 MG tablet, Take 1 tablet (40 mg total) by mouth at bedtime., Disp: 90 tablet, Rfl: 0 .  sucralfate (CARAFATE) 1 g tablet, Take 1 tablet (1 g total) by mouth 4 (four) times daily., Disp: 60 tablet, Rfl: 0 .  benzonatate (TESSALON PERLES) 100 MG capsule, Take 1 capsule (100 mg total) by mouth 3 (three) times daily as needed. (Patient not taking: Reported on 10/26/2017), Disp: 30 capsule, Rfl: 0 .  Diphenhyd-Hydrocort-Nystatin (FIRST-DUKES MOUTHWASH) SUSP, Use as directed 5 mLs in the mouth or throat 3 (three) times daily as needed (for throat pain.). Gargle and spit, do not swallow. (Patient not taking: Reported on 10/26/2017), Disp: 237 mL, Rfl: 0  No Known Allergies  ROS Constitutional: Negative for fever or weight change.  Respiratory: Negative for cough and shortness of breath.   Cardiovascular: Negative for chest pain or palpitations.  Gastrointestinal: Negative  for abdominal pain, no bowel changes.  Musculoskeletal: Negative for gait problem or joint swelling.  Skin: Negative for rash.  Neurological: Negative for dizziness or headache.  No other specific complaints in a complete review of systems (except as listed in HPI above).  Objective  Vitals:   10/26/17 1111  BP: 118/64  Pulse: 97  Resp: 18  Temp: 98.6 F (37 C)  TempSrc: Oral  SpO2: 97%  Weight: 239 lb 11.2 oz (108.7 kg)  Height: 5\' 11"  (1.803 m)   Body mass index is 33.43 kg/m.  Physical Exam Constitutional: Patient appears well-developed and well-nourished. No distress.  HENT: Head: Normocephalic and atraumatic. Cardiovascular: Normal rate, regular rhythm and normal heart sounds.  No murmur heard. No BLE edema. Pulmonary/Chest: Effort normal and breath sounds normal. No respiratory distress. Abdominal: Soft. Bowel sounds are normal, no distension. There is no tenderness. no masses Musculoskeletal: Normal range of motion, no joint effusions. No gross deformities Neurological: he is alert and oriented to person, place, and time. No cranial nerve deficit. Coordination, balance, strength, speech and gait are normal.  Skin: Skin is warm and dry. No rash noted. No erythema.  Psychiatric: Patient has a normal mood and affect. behavior is normal. Judgment and thought content normal.  No results found for this or any previous visit (from the past 72 hour(s)).  PHQ2/9: Depression screen [facility] 2/9 10/11/2017 09/17/2017 09/08/2017 08/31/2017 08/10/2017  Decreased Interest 0 0 0 0 0  Down, Depressed, Hopeless 0 0 0 0 0  PHQ - 2 Score 0 0 0 0 0   Fall Risk: Fall Risk  10/11/2017 09/17/2017 09/08/2017 08/31/2017 08/10/2017  Falls in the past year? No No No No No   Assessment & Plan  1. Uncontrolled type 2 diabetes mellitus with hyperglycemia (HCC) - Insulin Degludec-Liraglutide (XULTOPHY) 100-3.6 UNIT-MG/ML SOPN; Inject 34 Units into the skin at bedtime.  Dispense: 15 mL; Refill: 0 -  Empagliflozin-Metformin HCl (SYNJARDY) 12.02-999 MG TABS; Take 1 tablet by mouth 2 (two) times daily at 10 AM and 5 PM.  Dispense: 60 tablet; Refill: 0 - We will follow up with [facility] in 3 days, and provide titration orders based on fasting BG's.

## 2017-10-26 NOTE — Telephone Encounter (Signed)
Bryan Flowers at [phone] - left message to call office back to discuss medication changes and Xultophy titration.

## 2017-10-29 ENCOUNTER — Encounter: Payer: Self-pay | Admitting: Family Medicine

## 2017-10-29 ENCOUNTER — Telehealth: Payer: Self-pay | Admitting: Family Medicine

## 2017-10-29 DIAGNOSIS — E1165 Type 2 diabetes mellitus with hyperglycemia: Secondary | ICD-10-CM

## 2017-10-29 MED ORDER — INSULIN DEGLUDEC-LIRAGLUTIDE 100-3.6 UNIT-MG/ML ~~LOC~~ SOPN
34.0000 [IU] | PEN_INJECTOR | Freq: Every day | SUBCUTANEOUS | 0 refills | Status: DC
Start: 2017-10-29 — End: 2017-11-01

## 2017-10-29 NOTE — Telephone Encounter (Signed)
Spoke to [place] at the [facility] Blood sugar today 190 yesterday 1/24 it was 184 and 1/23 it was 280

## 2017-10-29 NOTE — Telephone Encounter (Signed)
Script faxed to pharmacy and [facility]

## 2017-10-29 NOTE — Telephone Encounter (Signed)
Increase to 38 units daily - New Rx printed, please fax.  Call back Monday to [facility] for fasting glucose measurements so that we may continue to titrate.

## 2017-10-29 NOTE — Telephone Encounter (Signed)
Spoke to [place], please write out orders and fax to pharmacy and to the [facility] at [phone]

## 2017-10-29 NOTE — Telephone Encounter (Signed)
Please call [facility] for fasting glucose - not patient; they should have a record for the last few days

## 2017-10-29 NOTE — Telephone Encounter (Signed)
Called patient left message for him to call office

## 2017-10-29 NOTE — Telephone Encounter (Addendum)
-----   Message from Hubbard Hartshorn, [place] sent at 10/27/2017 12:57 PM EST ----- Regarding: Call [place] and [place] at [facility] Please call [place] at [facility] to obtain fasting glucose levels over the last few days.  Please also ensure that Iva Boop has been started and glipizide stopped.  We will adjust Xultophy orders based on fasting glucose levels.

## 2017-11-01 ENCOUNTER — Other Ambulatory Visit: Payer: Self-pay | Admitting: Family Medicine

## 2017-11-01 ENCOUNTER — Telehealth: Payer: Self-pay | Admitting: Family Medicine

## 2017-11-01 DIAGNOSIS — E1165 Type 2 diabetes mellitus with hyperglycemia: Secondary | ICD-10-CM

## 2017-11-01 MED ORDER — INSULIN DEGLUDEC-LIRAGLUTIDE 100-3.6 UNIT-MG/ML ~~LOC~~ SOPN: 38.0000 [IU] | PEN_INJECTOR | Freq: Every day | SUBCUTANEOUS | 0 refills | Status: DC

## 2017-11-01 NOTE — Telephone Encounter (Signed)
Please call [facility] - pt's orders were supposed to be updated on Friday to increase Xultophy to 38units.  I know that you faxed Rx's with confirmation received to both [place]. Please verify when new order will go into effect so that we know when to call back for BG check. Thank you.

## 2017-11-01 NOTE — Progress Notes (Signed)
Script faxed to [facility]

## 2017-11-01 NOTE — Telephone Encounter (Signed)
New Rx provided to be faxed.

## 2017-11-01 NOTE — Progress Notes (Unsigned)
New Rx printed to be faxed to [facility] and [place].

## 2017-11-01 NOTE — Telephone Encounter (Signed)
Please call [facility] to obtain fasting BG's for patient over the weekend and today at 38 units Xultophy.

## 2017-11-01 NOTE — Telephone Encounter (Signed)
Spoke to [place] at [facility] she stated that they did not receive script for 38 units have the one from 1/22 for 34 units. Please fax script again to [phone]

## 2017-11-01 NOTE — Telephone Encounter (Signed)
BS on Friday 1/25 was 190, on Saturday morning at 152 and night at 235, Sunday  morning191 and at night 255. Today was 193 and in 34 units of Xultophy

## 2017-11-04 ENCOUNTER — Telehealth: Payer: Self-pay | Admitting: Family Medicine

## 2017-11-04 DIAGNOSIS — E1165 Type 2 diabetes mellitus with hyperglycemia: Secondary | ICD-10-CM

## 2017-11-04 MED ORDER — INSULIN DEGLUDEC-LIRAGLUTIDE 100-3.6 UNIT-MG/ML ~~LOC~~ SOPN: 42.0000 [IU] | PEN_INJECTOR | Freq: Every day | SUBCUTANEOUS | 0 refills | Status: DC

## 2017-11-04 NOTE — Telephone Encounter (Signed)
New order faxes to [facility]

## 2017-11-04 NOTE — Telephone Encounter (Signed)
Increase dose to 42 units daily - please fax new Rx to [facility] and [facility]. Pt is coming in tomorrow for DM follow up with Dr. [name].

## 2017-11-04 NOTE — Telephone Encounter (Signed)
Spoke to [employer] at [facility]. Patient BS this morning was 193. On January 30th, 152 evening and 210 morning. January 29th 305 evening. He is 38 units of Xultophy  Once a day

## 2017-11-04 NOTE — Telephone Encounter (Addendum)
-----   Message from Hubbard Hartshorn, FNP sent at 11/03/2017  8:54 AM EST ----- Regarding: Check on Fasting BG's Please call to obtain fasting BG's at [facility] for the last few days so that we can increase Xultophy appropriately.  Also find out how many days he has received the 38 unit dose of Xultophy. Thanks!

## 2017-11-05 ENCOUNTER — Ambulatory Visit (INDEPENDENT_AMBULATORY_CARE_PROVIDER_SITE_OTHER): Payer: PPO | Admitting: Family Medicine

## 2017-11-05 ENCOUNTER — Encounter: Payer: Self-pay | Admitting: Family Medicine

## 2017-11-05 DIAGNOSIS — E1165 Type 2 diabetes mellitus with hyperglycemia: Secondary | ICD-10-CM | POA: Diagnosis not present

## 2017-11-05 MED ORDER — INSULIN DEGLUDEC-LIRAGLUTIDE 100-3.6 UNIT-MG/ML ~~LOC~~ SOPN: 50.0000 [IU] | PEN_INJECTOR | Freq: Every day | SUBCUTANEOUS | 0 refills | Status: DC

## 2017-11-05 NOTE — Progress Notes (Signed)
Name: Bryan Flowers   MRN: [id number]    DOB: [birth year]   Date:11/05/2017       Progress Note  Subjective  Chief Complaint  Chief Complaint  Patient presents with  . Diabetes    follow up in 10 days; [place] increase pt insulin units and his sugars running better    Diabetes  He presents for his follow-up diabetic visit. He has type 2 diabetes mellitus. His disease course has been stable. There are no hypoglycemic associated symptoms. Pertinent negatives for hypoglycemia include no dizziness, speech difficulty or sweats. Associated symptoms include fatigue. Pertinent negatives for diabetes include no foot paresthesias, no polydipsia and no polyuria. There are no hypoglycemic complications. Current diabetic treatment includes oral agent (monotherapy) and intensive insulin program. He is following a generally healthy diet. He rarely participates in exercise. His breakfast blood glucose range is generally 180-200 mg/dl. An ACE inhibitor/angiotensin II receptor blocker is not being taken.      Past Medical History:  Diagnosis Date  . Anemia    iron deficiency  . Bipolar 1 disorder, manic, moderate ([place])   . Depression   . Diabetes mellitus without complication ([place])   . GERD (gastroesophageal reflux disease)   . Hyperlipidemia   . Hypertension   . Insomnia   . Parkinson disease ([place])   . Peptic ulcer disease with hemorrhage     Past Surgical History:  Procedure Laterality Date  . ESOPHAGOGASTRODUODENOSCOPY Left 03/16/2016   Procedure: ESOPHAGOGASTRODUODENOSCOPY (EGD);  Surgeon: Manus Gunning, MD;  Location: [facility];  Service: Gastroenterology;  Laterality: Left;  . FOOT FRACTURE SURGERY Right     Family History  Problem Relation Age of Onset  . Cancer Mother   . Heart disease Father   . Arthritis Brother     Social History   Socioeconomic History  . Marital status: Married    Spouse name: Not on file  . Number of children: 0  . Years of education: Not on  file  . Highest education level: Not on file  Social Needs  . Financial resource strain: Not hard at all  . Food insecurity - worry: Never true  . Food insecurity - inability: Never true  . Transportation needs - medical: No  . Transportation needs - non-medical: No  Occupational History    Employer: DISABLED  Tobacco Use  . Smoking status: Never Smoker  . Smokeless tobacco: Never Used  . Tobacco comment: Non-smoker  Substance and Sexual Activity  . Alcohol use: No    Alcohol/week: 0.0 oz  . Drug use: No  . Sexual activity: No    Partners: Female  Other Topics Concern  . Not on file  Social History Narrative  . Not on file     Current Outpatient Medications:  .  acetaminophen (TYLENOL) 500 MG tablet, Take 1,000 mg by mouth every 6 (six) hours as needed (pain)., Disp: , Rfl:  .  Armodafinil 250 MG tablet, Take by mouth., Disp: , Rfl:  .  aspirin (ASPIRIN 81) 81 MG chewable tablet, Chew by mouth., Disp: , Rfl:  .  b complex vitamins capsule, Take 1 capsule by mouth daily., Disp: , Rfl:  .  carbidopa-levodopa (SINEMET CR) 50-200 MG per tablet, Take 1 tablet by mouth 3 (three) times daily. , Disp: , Rfl:  .  DULoxetine (CYMBALTA) 60 MG capsule, Take 120 mg by mouth at bedtime. , Disp: , Rfl:  .  Empagliflozin-Metformin HCl (SYNJARDY) 12.02-999 MG TABS, Take 1 tablet by mouth  2 (two) times daily at 10 AM and 5 PM., Disp: 60 tablet, Rfl: 0 .  famotidine (PEPCID) 40 MG tablet, Take 1 tablet (40 mg total) by mouth every evening., Disp: 30 tablet, Rfl: 1 .  fluticasone (FLONASE) 50 MCG/ACT nasal spray, Place 2 sprays into both nostrils daily., Disp: 16 g, Rfl: 2 .  Insulin Degludec-Liraglutide (XULTOPHY) 100-3.6 UNIT-MG/ML SOPN, Inject 42 Units into the skin at bedtime., Disp: 15 mL, Rfl: 0 .  lamoTRIgine (LAMICTAL) 200 MG tablet, Take 200 mg by mouth at bedtime. , Disp: , Rfl:  .  LORazepam (ATIVAN) 0.5 MG tablet, Take 0.5 mg by mouth 3 (three) times daily as needed for anxiety. ,  Disp: , Rfl:  .  LYRICA 50 MG capsule, tk 1 capsule at bedtime, Disp: 30 capsule, Rfl: 2 .  ondansetron (ZOFRAN) 4 MG tablet, Take 1 tablet (4 mg total) by mouth every 8 (eight) hours as needed for nausea or vomiting., Disp: 20 tablet, Rfl: 0 .  pantoprazole (PROTONIX) 40 MG tablet, Take 1 tablet (40 mg total) by mouth daily., Disp: 30 tablet, Rfl: 0 .  propranolol ER (INDERAL LA) 60 MG 24 hr capsule, Take 60 mg by mouth at bedtime. , Disp: , Rfl:  .  QUEtiapine (SEROQUEL XR) 400 MG 24 hr tablet, TK 1 T PO QD AT 8PM ONE HOUR BEFORE OR AFTER A MEAL, Disp: , Rfl: 2 .  quinapril-hydrochlorothiazide (ACCURETIC) 20-12.5 MG tablet, Take 2 tablets by mouth daily., Disp: 180 tablet, Rfl: 0 .  rOPINIRole (REQUIP) 1 MG tablet, Take 2 mg by mouth at bedtime. , Disp: , Rfl:  .  simvastatin (ZOCOR) 40 MG tablet, Take 1 tablet (40 mg total) by mouth at bedtime., Disp: 90 tablet, Rfl: 0 .  sucralfate (CARAFATE) 1 g tablet, Take 1 tablet (1 g total) by mouth 4 (four) times daily., Disp: 60 tablet, Rfl: 0  No Known Allergies   Review of Systems  Constitutional: Positive for fatigue.  Neurological: Negative for dizziness and speech difficulty.  Endo/Heme/Allergies: Negative for polydipsia.     Objective  Vitals:   11/05/17 0956  BP: 116/62  Pulse: 95  Resp: 14  Temp: 97.9 F (36.6 C)  TempSrc: Oral  SpO2: 98%  Weight: 232 lb 1.6 oz (105.3 kg)    Physical Exam  Constitutional: He is oriented to person, place, and time and well-developed, well-nourished, and in no distress.  HENT:  Head: Normocephalic and atraumatic.  Cardiovascular: Regular rhythm, S1 normal and S2 normal. Tachycardia present.  Murmur heard.  Systolic murmur is present with a grade of 2/6. Pulmonary/Chest: Effort normal and breath sounds normal. He has no decreased breath sounds. He has no wheezes.  Abdominal: Soft. Bowel sounds are normal.  Neurological: He is alert and oriented to person, place, and time.  Vitals  reviewed.       Assessment & Plan  1. Uncontrolled type 2 diabetes mellitus with hyperglycemia (HCC) Fasting blood glucose readings have been improving, now down to 195, he is taking 42 units of Xultophy at bedtime, advised to increase to 50 units by going up 2 units every 3-4 days, continue with dietary lifestyle changes, follow-up with new PCP in 2-3 months - Insulin Degludec-Liraglutide (XULTOPHY) 100-3.6 UNIT-MG/ML SOPN; Inject 50 Units into the skin at bedtime.  Dispense: 15 mL; Refill: 0   Leonid Manus Asad A. [facility] 11/05/2017 10:05 AM

## 2017-12-06 ENCOUNTER — Telehealth: Payer: Self-pay | Admitting: Family Medicine

## 2017-12-06 NOTE — Telephone Encounter (Signed)
Please verify policy regarding FL2 - if able to, please schedule with me at his convenience.

## 2017-12-06 NOTE — Telephone Encounter (Unsigned)
Copied from [place]. Topic: Appointment Scheduling - Scheduling Inquiry for Clinic >> Dec 06, 2017  2:05 PM Hewitt Shorts wrote: Reason for CRM: pt is needing to get his FL2 form completed by March the 9th and they patient power of attorney did not get the form until the 12/04/17 but nothing is available on the schedule with Dr. Ancil Boozer   Best number [phone]

## 2017-12-06 NOTE — Telephone Encounter (Signed)
Patients wife calling because she said she had a missed call from the office but there aren't any out going calls to her. Did advise pt wife that someone would give her a call back at [phone]

## 2017-12-07 ENCOUNTER — Ambulatory Visit (INDEPENDENT_AMBULATORY_CARE_PROVIDER_SITE_OTHER): Payer: PPO | Admitting: Family Medicine

## 2017-12-07 ENCOUNTER — Encounter: Payer: Self-pay | Admitting: Family Medicine

## 2017-12-07 VITALS — BP 112/82 | HR 103 | Temp 98.3°F | Resp 18 | Ht 71.0 in | Wt 240.8 lb

## 2017-12-07 DIAGNOSIS — E11649 Type 2 diabetes mellitus with hypoglycemia without coma: Secondary | ICD-10-CM | POA: Diagnosis not present

## 2017-12-07 DIAGNOSIS — F3112 Bipolar disorder, current episode manic without psychotic features, moderate: Secondary | ICD-10-CM | POA: Insufficient documentation

## 2017-12-07 DIAGNOSIS — Z593 Problems related to living in residential institution: Secondary | ICD-10-CM | POA: Diagnosis not present

## 2017-12-07 MED ORDER — EMPAGLIFLOZIN-METFORMIN HCL 12.5-1000 MG PO TABS: 1.0000 | ORAL_TABLET | Freq: Two times a day (BID) | ORAL | 1 refills | Status: DC

## 2017-12-07 NOTE — Telephone Encounter (Signed)
Appointment today

## 2017-12-07 NOTE — Telephone Encounter (Signed)
Please verify policy regarding FL2 - if able to, please schedule with me at his convenience.

## 2017-12-07 NOTE — Progress Notes (Signed)
Name: Bryan Flowers   MRN: [id number]    DOB: [birth year]   Date:12/07/2017       Progress Note  Subjective  Chief Complaint  Chief Complaint  Patient presents with  . Annual Exam    FL2 forms for [facility]    HPI  Diabetes: Claris Che was increased to 50 units daily, and Synjardy was added at 12.02-999 BID.  Pt has been doing better on current medication regimen - BG's have been running in the mid 100's up to 200.  He has been feeling much better - no polyphagia, polydipsia, or polyuria, denies abnormal headaches or vision changes.  No hypoglycemic episodes.  Not due for A1C check until April - last check was 10/11/17 and was 8.5%. Taking ACE-I (quinapril) and daily 81mg  aspirin.  PT presents for FL-2 Form completion - please see FL2 form for details.   - He has been living at the [facility] for almost a year (started in May 2018) - assisted living. He was placed due to concerns that he was unable to care for his wife or himself in their current states of health. - Corrective lenses, RIGHT hearing aid - Active in his community - Bingo, meals, etc. Also sees his brother frequently, also visits his wife's family and friends frequently. - Active medical problems and medications updated as below.    Patient Active Problem List   Diagnosis Date Noted  . Bipolar 1 disorder, manic, moderate ([place])   . Vitamin D deficiency 09/10/2017  . GERD (gastroesophageal reflux disease) 09/10/2017  . Claudication ([place]) 07/16/2017  . Encounter for examination for driving license [id number]  . Annual physical exam 10/01/2016  . Bilateral leg pain 08/20/2016  . Musculoskeletal leg pain, left 07/21/2016  . Fatigue 05/18/2016  . Sore throat 04/16/2016  . Iron deficiency anemia 03/30/2016  . Normocytic anemia due to blood loss 03/27/2016  . Anemia associated with acute blood loss 03/24/2016  . Multiple gastric ulcers   . Duodenal ulcer   . Anemia 03/15/2016  . GI bleed 03/15/2016  .  Hyponatremia 03/15/2016  . Acute blood loss anemia 03/15/2016  . Parkinson disease ([place])   . Hypertension   . Subacute maxillary sinusitis 02/27/2016  . Anxiety 08/19/2015  . Type 2 diabetes mellitus ([place]) 08/19/2015  . Abnormal ECG 04/09/2015  . Back ache 04/09/2015  . Clinical depression 04/09/2015  . Uncontrolled type 2 diabetes mellitus ([place]) 04/09/2015  . Obstructive sleep apnea 04/09/2015  . HDL deficiency 04/09/2015  . Hearing loss, sensorineural, combined types 04/09/2015  . HLD (hyperlipidemia) 04/09/2015  . BP (high blood pressure) 04/09/2015  . Idiopathic Parkinson's disease ([place]) 04/09/2015  . Vocal cord atrophy 05/29/2014  . Fibrositis 03/15/2014  . Fibromyalgia 03/15/2014  . Bipolar I disorder, single manic episode, moderate ([place]) 03/17/2010  . Testicular hypofunction 05/17/2009  . Migraine with aura 05/14/2009  . Anxiety state 03/19/2008  . Cannot sleep 03/05/2008  . Reflux esophagitis 07/12/2007    Social History   Tobacco Use  . Smoking status: Never Smoker  . Smokeless tobacco: Never Used  . Tobacco comment: Non-smoker  Substance Use Topics  . Alcohol use: No    Alcohol/week: 0.0 oz     Current Outpatient Medications:  .  acetaminophen (TYLENOL) 500 MG tablet, Take 1,000 mg by mouth every 6 (six) hours as needed (pain)., Disp: , Rfl:  .  Armodafinil 250 MG tablet, Take by mouth., Disp: , Rfl:  .  aspirin (ASPIRIN 81) 81 MG  chewable tablet, Chew by mouth., Disp: , Rfl:  .  b complex vitamins capsule, Take 1 capsule by mouth daily., Disp: , Rfl:  .  carbidopa-levodopa (SINEMET CR) 50-200 MG per tablet, Take 1 tablet by mouth 3 (three) times daily. , Disp: , Rfl:  .  DULoxetine (CYMBALTA) 60 MG capsule, Take 120 mg by mouth at bedtime. , Disp: , Rfl:  .  Empagliflozin-Metformin HCl (SYNJARDY) 12.02-999 MG TABS, Take 1 tablet by mouth 2 (two) times daily at 10 AM and 5 PM., Disp: 60 tablet, Rfl: 0 .  famotidine (PEPCID) 40 MG tablet, Take 1 tablet (40 mg  total) by mouth every evening., Disp: 30 tablet, Rfl: 1 .  fluticasone (FLONASE) 50 MCG/ACT nasal spray, Place 2 sprays into both nostrils daily., Disp: 16 g, Rfl: 2 .  Insulin Degludec-Liraglutide (XULTOPHY) 100-3.6 UNIT-MG/ML SOPN, Inject 50 Units into the skin at bedtime., Disp: 15 mL, Rfl: 0 .  lamoTRIgine (LAMICTAL) 200 MG tablet, Take 200 mg by mouth at bedtime. , Disp: , Rfl:  .  LORazepam (ATIVAN) 0.5 MG tablet, Take 0.5 mg by mouth 3 (three) times daily as needed for anxiety. , Disp: , Rfl:  .  LYRICA 50 MG capsule, tk 1 capsule at bedtime, Disp: 30 capsule, Rfl: 2 .  ondansetron (ZOFRAN) 4 MG tablet, Take 1 tablet (4 mg total) by mouth every 8 (eight) hours as needed for nausea or vomiting., Disp: 20 tablet, Rfl: 0 .  pantoprazole (PROTONIX) 40 MG tablet, Take 1 tablet (40 mg total) by mouth daily., Disp: 30 tablet, Rfl: 0 .  propranolol ER (INDERAL LA) 60 MG 24 hr capsule, Take 60 mg by mouth at bedtime. , Disp: , Rfl:  .  QUEtiapine (SEROQUEL XR) 400 MG 24 hr tablet, TK 1 T PO QD AT 8PM ONE HOUR BEFORE OR AFTER A MEAL, Disp: , Rfl: 2 .  rOPINIRole (REQUIP) 1 MG tablet, Take 2 mg by mouth at bedtime. , Disp: , Rfl:  .  simvastatin (ZOCOR) 40 MG tablet, Take 1 tablet (40 mg total) by mouth at bedtime., Disp: 90 tablet, Rfl: 0 .  sucralfate (CARAFATE) 1 g tablet, Take 1 tablet (1 g total) by mouth 4 (four) times daily., Disp: 60 tablet, Rfl: 0 .  quinapril-hydrochlorothiazide (ACCURETIC) 20-12.5 MG tablet, Take 2 tablets by mouth daily., Disp: 180 tablet, Rfl: 0  No Known Allergies  ROS  Constitutional: Negative for fever or weight change.  Respiratory: Negative for cough and shortness of breath.   Cardiovascular: Negative for chest pain or palpitations.  Gastrointestinal: Negative for abdominal pain, no bowel changes.  Musculoskeletal: Negative for gait problem or joint swelling.  Skin: Negative for rash.  Neurological: Negative for dizziness or headache.  No other specific  complaints in a complete review of systems (except as listed in HPI above).  Objective  Vitals:   12/07/17 1459  BP: 112/82  Pulse: (!) 103  Resp: 18  Temp: 98.3 F (36.8 C)  TempSrc: Oral  SpO2: 96%  Weight: 240 lb 12.8 oz (109.2 kg)  Height: 5\' 11"  (1.803 m)   Body mass index is 33.58 kg/m.  Nursing Note and Vital Signs reviewed.  Physical Exam  Constitutional: Patient appears well-developed and well-nourished. Obese No distress.  HEENT: head atraumatic, normocephalic Neurological: he is alert and oriented to person, place, and time.  Skin: Skin is warm and dry. No rash noted. Psychiatric: Patient has a normal mood and affect. behavior is normal. Judgment and thought content normal. Cardiovascular: Normal  rate, regular rhythm and normal heart sounds.  No murmur heard. No BLE edema. Pulmonary/Chest: Effort normal and breath sounds normal. No respiratory distress.  No results found for this or any previous visit (from the past 72 hour(s)).  Assessment & Plan  1. Uncontrolled type 2 diabetes mellitus with hypoglycemia without coma ([place]) - BG's are significantly improved, and he is asymptomatic. Maintain current medication regimen at this time, we will recheck A1C in 1 month at follow up appointment.  Discussed what to do in the event of hyper/hypoglycemic episodes. - Continue diabetic diet as assisted living home. - Health maintenance - foot and eye exams and vaccinations are UTD. - No refills needed at this time. - Empagliflozin-metFORMIN HCl (SYNJARDY) 12.02-999 MG TABS; Take 1 tablet by mouth 2 (two) times daily at 10 AM and 5 PM.  Dispense: 180 tablet; Refill: 1  2. Living in assisted living [facility] Form completed, copy to be scanned in to chart.  Recommend patient continue current assisted living arrangement at the [facility].

## 2017-12-28 DIAGNOSIS — F3181 Bipolar II disorder: Secondary | ICD-10-CM | POA: Diagnosis not present

## 2017-12-28 DIAGNOSIS — G4726 Circadian rhythm sleep disorder, shift work type: Secondary | ICD-10-CM | POA: Diagnosis not present

## 2017-12-28 DIAGNOSIS — G2581 Restless legs syndrome: Secondary | ICD-10-CM | POA: Diagnosis not present

## 2018-01-06 ENCOUNTER — Ambulatory Visit: Payer: PPO | Admitting: Family Medicine

## 2018-01-07 ENCOUNTER — Ambulatory Visit: Payer: PPO | Admitting: Family Medicine

## 2018-01-10 DIAGNOSIS — G2111 Neuroleptic induced parkinsonism: Secondary | ICD-10-CM | POA: Diagnosis not present

## 2018-01-10 DIAGNOSIS — E6609 Other obesity due to excess calories: Secondary | ICD-10-CM | POA: Diagnosis not present

## 2018-01-10 DIAGNOSIS — Z6832 Body mass index (BMI) 32.0-32.9, adult: Secondary | ICD-10-CM | POA: Diagnosis not present

## 2018-01-10 DIAGNOSIS — F319 Bipolar disorder, unspecified: Secondary | ICD-10-CM | POA: Diagnosis not present

## 2018-01-10 DIAGNOSIS — G2581 Restless legs syndrome: Secondary | ICD-10-CM | POA: Diagnosis not present

## 2018-01-12 DIAGNOSIS — G4726 Circadian rhythm sleep disorder, shift work type: Secondary | ICD-10-CM | POA: Diagnosis not present

## 2018-01-12 DIAGNOSIS — F3181 Bipolar II disorder: Secondary | ICD-10-CM | POA: Diagnosis not present

## 2018-01-12 DIAGNOSIS — G2581 Restless legs syndrome: Secondary | ICD-10-CM | POA: Diagnosis not present

## 2018-01-17 ENCOUNTER — Other Ambulatory Visit: Payer: Self-pay

## 2018-01-17 NOTE — Telephone Encounter (Signed)
Refill request for general medication: Lyrica 50 mg  Last office visit: 12/07/2017  Last physical exam: None indicated  Follow-ups on file. None indicated

## 2018-01-18 ENCOUNTER — Other Ambulatory Visit: Payer: Self-pay

## 2018-01-18 NOTE — Telephone Encounter (Signed)
Pharmacy requesting refill.

## 2018-01-20 ENCOUNTER — Other Ambulatory Visit: Payer: Self-pay

## 2018-01-20 NOTE — Telephone Encounter (Signed)
Refill request for general medication. Lyrica  Last office visit: 12/07/2017   No follow-ups on file.

## 2018-01-20 NOTE — Telephone Encounter (Signed)
Please advise 

## 2018-01-21 ENCOUNTER — Other Ambulatory Visit: Payer: Self-pay | Admitting: Family Medicine

## 2018-01-21 MED ORDER — LYRICA 50 MG PO CAPS: ORAL_CAPSULE | ORAL | 2 refills | Status: DC

## 2018-02-03 ENCOUNTER — Other Ambulatory Visit: Payer: Self-pay | Admitting: Nurse Practitioner

## 2018-02-15 DIAGNOSIS — G4726 Circadian rhythm sleep disorder, shift work type: Secondary | ICD-10-CM | POA: Diagnosis not present

## 2018-02-15 DIAGNOSIS — G2581 Restless legs syndrome: Secondary | ICD-10-CM | POA: Diagnosis not present

## 2018-02-15 DIAGNOSIS — F3181 Bipolar II disorder: Secondary | ICD-10-CM | POA: Diagnosis not present

## 2018-03-14 ENCOUNTER — Encounter: Payer: Self-pay | Admitting: Family Medicine

## 2018-03-14 ENCOUNTER — Ambulatory Visit (INDEPENDENT_AMBULATORY_CARE_PROVIDER_SITE_OTHER): Payer: PPO | Admitting: Family Medicine

## 2018-03-14 VITALS — BP 108/68 | HR 107 | Temp 98.1°F | Resp 16 | Ht 71.0 in | Wt 231.1 lb

## 2018-03-14 DIAGNOSIS — E1165 Type 2 diabetes mellitus with hyperglycemia: Secondary | ICD-10-CM

## 2018-03-14 DIAGNOSIS — I10 Essential (primary) hypertension: Secondary | ICD-10-CM | POA: Diagnosis not present

## 2018-03-14 DIAGNOSIS — F3175 Bipolar disorder, in partial remission, most recent episode depressed: Secondary | ICD-10-CM

## 2018-03-14 DIAGNOSIS — E1169 Type 2 diabetes mellitus with other specified complication: Secondary | ICD-10-CM

## 2018-03-14 DIAGNOSIS — E119 Type 2 diabetes mellitus without complications: Secondary | ICD-10-CM | POA: Diagnosis not present

## 2018-03-14 DIAGNOSIS — G20 Parkinson's disease: Secondary | ICD-10-CM

## 2018-03-14 DIAGNOSIS — F339 Major depressive disorder, recurrent, unspecified: Secondary | ICD-10-CM | POA: Insufficient documentation

## 2018-03-14 DIAGNOSIS — E669 Obesity, unspecified: Secondary | ICD-10-CM | POA: Diagnosis not present

## 2018-03-14 DIAGNOSIS — I739 Peripheral vascular disease, unspecified: Secondary | ICD-10-CM

## 2018-03-14 DIAGNOSIS — H6122 Impacted cerumen, left ear: Secondary | ICD-10-CM | POA: Diagnosis not present

## 2018-03-14 LAB — POCT UA - MICROALBUMIN: Microalbumin Ur, POC: 20 mg/L

## 2018-03-14 LAB — POCT GLYCOSYLATED HEMOGLOBIN (HGB A1C): Hemoglobin A1C: 8 % — AB (ref 4.0–5.6)

## 2018-03-14 MED ORDER — LYRICA 50 MG PO CAPS: ORAL_CAPSULE | ORAL | 5 refills | Status: DC

## 2018-03-14 MED ORDER — EMPAGLIFLOZIN-METFORMIN HCL 12.5-1000 MG PO TABS: 1.0000 | ORAL_TABLET | Freq: Two times a day (BID) | ORAL | 3 refills | Status: DC

## 2018-03-14 MED ORDER — INSULIN DEGLUDEC-LIRAGLUTIDE 100-3.6 UNIT-MG/ML ~~LOC~~ SOPN: 50.0000 [IU] | PEN_INJECTOR | Freq: Every day | SUBCUTANEOUS | 2 refills | Status: DC

## 2018-03-14 MED ORDER — QUINAPRIL-HYDROCHLOROTHIAZIDE 20-12.5 MG PO TABS: 1.0000 | ORAL_TABLET | Freq: Every day | ORAL | 3 refills | Status: DC

## 2018-03-14 MED ORDER — GLIPIZIDE ER 2.5 MG PO TB24: 2.5000 mg | ORAL_TABLET | Freq: Every day | ORAL | 3 refills | Status: DC

## 2018-03-14 NOTE — Progress Notes (Signed)
Name: Bryan Flowers   MRN: [id number]    DOB: 05/26/1961   Date:03/14/2018       Progress Note  Subjective  Chief Complaint  Chief Complaint  Patient presents with  . Medication Refill    3 month F/U  . Diabetes    Checks twice daily, Lowest-104 Average-115 Highest-185  . Ear Fullness    Onset-couple of weeks on left ear- sinus pressure    HPI  Diabetes type II: he has been on Xultophy, Metformin  and Glipizide 5 mg for months, and on his last visit hgbA1C was 8.5, therefore metformin was switched to Synjardi 12.5 /1000 mg twice daily. He lives in an assistant living and follows a diabetic diet, his fasting glucose has been controlled. Denies recent hypoglycemia. No polyphagia or polydipsia but has polyuria. He denied side effects from DM medication.  Bipolar disorder: under the care of Dr. Arminda Resides at [facility]. He states his last episode was of depression but feeling well at this time. No suicidal thoughts or ideation   Parkinson's disease: sees neurologist , taking medication, tremors are under control and gait is back to normal. Still under the care of neurologist.   Claudication: seen by Dr. Lucky Cowboy, he used to have a lot of pain that had to make him stop but states doing well now. It may have been neurogenic claudication since at the time he was having a lot of back pain  Left ear pain and fullness: started about one week ago, no nasal congestion, or fever. No change in hearing loss, he has a hearing aid but is under repair right now  HTN: bp towards low end of normal, we will adjust dose of quinapril hctz from twice daily to once a day, continue propranolol and monitor . No chest pain or palpitation.   Patient Active Problem List   Diagnosis Date Noted  . Major depression, recurrent ([place]) 03/14/2018  . Living in assisted living 12/07/2017  . Bipolar 1 disorder, manic, moderate ([place])   . Vitamin D deficiency 09/10/2017  . GERD (gastroesophageal  reflux disease) 09/10/2017  . Claudication ([place]) 07/16/2017  . History of iron deficiency anemia 03/30/2016  . History of iron deficiency anemia 03/24/2016  . History of GI bleed 03/15/2016  . Hyponatremia 03/15/2016  . Parkinson disease ([place])   . Hypertension   . Anxiety 08/19/2015  . Abnormal ECG 04/09/2015  . Back ache 04/09/2015  . Diabetes mellitus type 2 in obese ([place]) 04/09/2015  . Obstructive sleep apnea 04/09/2015  . HDL deficiency 04/09/2015  . Hearing loss, sensorineural, combined types 04/09/2015  . HLD (hyperlipidemia) 04/09/2015  . Vocal cord atrophy 05/29/2014  . Fibromyalgia 03/15/2014  . Testicular hypofunction 05/17/2009  . Migraine with aura 05/14/2009    Past Surgical History:  Procedure Laterality Date  . ESOPHAGOGASTRODUODENOSCOPY Left 03/16/2016   Procedure: ESOPHAGOGASTRODUODENOSCOPY (EGD);  Surgeon: Manus Gunning, MD;  Location: [facility];  Service: Gastroenterology;  Laterality: Left;  . FOOT FRACTURE SURGERY Right     Family History  Problem Relation Age of Onset  . Cancer Mother   . Heart disease Father   . Arthritis Brother     Social History   Socioeconomic History  . Marital status: Married    Spouse name: Not on file  . Number of children: 0  . Years of education: Not on file  . Highest education level: Not on file  Occupational History    Employer: DISABLED  Social Needs  .  Financial resource strain: Not hard at all  . Food insecurity:    Worry: Never true    Inability: Never true  . Transportation needs:    Medical: No    Non-medical: No  Tobacco Use  . Smoking status: Never Smoker  . Smokeless tobacco: Never Used  . Tobacco comment: Non-smoker  Substance and Sexual Activity  . Alcohol use: No    Alcohol/week: 0.0 oz  . Drug use: No  . Sexual activity: Never    Partners: Female  Lifestyle  . Physical activity:    Days per week: 7 days    Minutes per session: 30 min  . Stress: Not on file  Relationships   . Social connections:    Talks on phone: Once a week    Gets together: Once a week    Attends religious service: More than 4 times per year    Active member of club or organization: Yes    Attends meetings of clubs or organizations: More than 4 times per year    Relationship status: Married  . Intimate partner violence:    Fear of current or ex partner: No    Emotionally abused: No    Physically abused: No    Forced sexual activity: No  Other Topics Concern  . Not on file  Social History Narrative  . Not on file     Current Outpatient Medications:  .  acetaminophen (TYLENOL) 500 MG tablet, Take 1,000 mg by mouth every 6 (six) hours as needed (pain)., Disp: , Rfl:  .  Armodafinil 250 MG tablet, Take by mouth., Disp: , Rfl:  .  aspirin (ASPIRIN 81) 81 MG chewable tablet, Chew by mouth., Disp: , Rfl:  .  b complex vitamins capsule, Take 1 capsule by mouth daily., Disp: , Rfl:  .  carbidopa-levodopa (SINEMET CR) 50-200 MG per tablet, Take 1 tablet by mouth 3 (three) times daily. , Disp: , Rfl:  .  DULoxetine (CYMBALTA) 60 MG capsule, Take 120 mg by mouth at bedtime., Disp: , Rfl:  .  Empagliflozin-metFORMIN HCl (SYNJARDY) 12.02-999 MG TABS, Take 1 tablet by mouth 2 (two) times daily at 10 AM and 5 PM., Disp: 180 tablet, Rfl: 1 .  famotidine (PEPCID) 40 MG tablet, Take 1 tablet (40 mg total) by mouth every evening., Disp: 30 tablet, Rfl: 1 .  fluticasone (FLONASE) 50 MCG/ACT nasal spray, Place 2 sprays into both nostrils daily., Disp: 16 g, Rfl: 2 .  Insulin Degludec-Liraglutide (XULTOPHY) 100-3.6 UNIT-MG/ML SOPN, Inject 50 Units into the skin at bedtime., Disp: 15 mL, Rfl: 0 .  lamoTRIgine (LAMICTAL) 200 MG tablet, Take 200 mg by mouth at bedtime., Disp: , Rfl:  .  LORazepam (ATIVAN) 0.5 MG tablet, Take 1 mg by mouth 3 (three) times daily as needed for anxiety., Disp: , Rfl:  .  LYRICA 50 MG capsule, tk 1 capsule at bedtime, Disp: 30 capsule, Rfl: 2 .  ondansetron (ZOFRAN) 4 MG tablet,  Take 1 tablet (4 mg total) by mouth every 8 (eight) hours as needed for nausea or vomiting., Disp: 20 tablet, Rfl: 0 .  pantoprazole (PROTONIX) 40 MG tablet, Take 1 tablet (40 mg total) by mouth daily., Disp: 30 tablet, Rfl: 0 .  propranolol ER (INDERAL LA) 60 MG 24 hr capsule, Take 60 mg by mouth at bedtime. , Disp: , Rfl:  .  QUEtiapine (SEROQUEL XR) 400 MG 24 hr tablet, TK 1 T PO QD AT 8PM ONE HOUR BEFORE OR AFTER A MEAL,  Disp: , Rfl: 2 .  rOPINIRole (REQUIP) 3 MG tablet, Take by mouth., Disp: , Rfl:   No Known Allergies   ROS  Constitutional: Negative for fever or weight change.  Respiratory: Negative for cough and shortness of breath.   Cardiovascular: Negative for chest pain or palpitations.  Gastrointestinal: Negative for abdominal pain, no bowel changes.  Musculoskeletal: Negative for gait problem or joint swelling.  Skin: Negative for rash.  Neurological: Negative for dizziness or headache.  No other specific complaints in a complete review of systems (except as listed in HPI above).  Objective  Vitals:   03/14/18 1429 03/14/18 1436  BP: 108/68   Pulse: (!) 119 (!) 107  Resp: 16   Temp: 98.1 F (36.7 C)   TempSrc: Oral   SpO2: 96%   Weight: 231 lb 1.6 oz (104.8 kg)   Height: 5\' 11"  (1.803 m)     Body mass index is 32.23 kg/m.  Physical Exam  Constitutional: Patient appears well-developed and well-nourished. Obese  No distress.  HEENT: head atraumatic, normocephalic, right eye strabismus, pupils equal and reactive to light,neck supple, throat within normal limits, mild wax on ear canal Cardiovascular: tachycardia, regular rhythm and normal heart sounds.  No murmur heard. No BLE edema. Pulmonary/Chest: Effort normal and breath sounds normal. No respiratory distress. Abdominal: Soft.  There is no tenderness. Psychiatric: Patient has a normal mood and affect. behavior is normal. Judgment and thought content normal.  Recent Results (from the past 2160 hour(s))   POCT HgB A1C     Status: Abnormal   Collection Time: 03/14/18  2:22 PM  Result Value Ref Range   Hemoglobin A1C 8.0 (A) 4.0 - 5.6 %   HbA1c, POC (prediabetic range)  5.7 - 6.4 %   HbA1c, POC (controlled diabetic range)  0.0 - 7.0 %  POCT UA - Microalbumin     Status: Normal   Collection Time: 03/14/18  2:22 PM  Result Value Ref Range   Microalbumin Ur, POC 20 mg/L   Creatinine, POC  mg/dL   Albumin/Creatinine Ratio, Urine, POC       PHQ2/9: Depression screen [facility] 2/9 03/14/2018 11/05/2017 10/11/2017 09/17/2017 09/08/2017  Decreased Interest 0 0 0 0 0  Down, Depressed, Hopeless 0 0 0 0 0  PHQ - 2 Score 0 0 0 0 0  Altered sleeping 2 - - - -  Tired, decreased energy 1 - - - -  Change in appetite 0 - - - -  Feeling bad or failure about yourself  0 - - - -  Trouble concentrating 0 - - - -  Moving slowly or fidgety/restless 0 - - - -  Suicidal thoughts 0 - - - -  PHQ-9 Score 3 - - - -  Difficult doing work/chores Somewhat difficult - - - -     Fall Risk: Fall Risk  03/14/2018 11/05/2017 10/11/2017 09/17/2017 09/08/2017  Falls in the past year? No No No No No     Functional Status Survey: Is the patient deaf or have difficulty hearing?: Yes(hearing aid in right ear) Does the patient have difficulty seeing, even when wearing glasses/contacts?: Yes(prescription glasses) Does the patient have difficulty concentrating, remembering, or making decisions?: No Does the patient have difficulty walking or climbing stairs?: No Does the patient have difficulty dressing or bathing?: No Does the patient have difficulty doing errands alone such as visiting a doctor's office or shopping?: No    Assessment & Plan  1. Diabetes mellitus with coincident hypertension ([place])  -  POCT HgB A1C - POCT UA - Microalbumin - Empagliflozin-metFORMIN HCl (SYNJARDY) 12.02-999 MG TABS; Take 1 tablet by mouth 2 (two) times daily at 10 AM and 5 PM.  Dispense: 60 tablet; Refill: 3  2. Diabetes mellitus type 2 in  obese ([place])   3. Uncontrolled type 2 diabetes mellitus with hyperglycemia ([facility])  - Insulin Degludec-Liraglutide (XULTOPHY) 100-3.6 UNIT-MG/ML SOPN; Inject 50 Units into the skin at bedtime.  Dispense: 15 mL; Refill: 2  4. Essential hypertension  Towards low end of normal, we will decrease dose of quinapril hctz to once daily   5. Parkinson disease ([place])  Doing well on medication   6. Bipolar disorder, in partial remission, most recent episode depressed ([place])  Continue medication and follow up with psychiatrist  7. Claudication ([place])  Improved with back pain improving   8. Impacted cerumen of left ear  Try otc medication first.

## 2018-03-16 DIAGNOSIS — G2581 Restless legs syndrome: Secondary | ICD-10-CM | POA: Diagnosis not present

## 2018-03-16 DIAGNOSIS — G4726 Circadian rhythm sleep disorder, shift work type: Secondary | ICD-10-CM | POA: Diagnosis not present

## 2018-03-16 DIAGNOSIS — F3181 Bipolar II disorder: Secondary | ICD-10-CM | POA: Diagnosis not present

## 2018-03-28 DIAGNOSIS — G2581 Restless legs syndrome: Secondary | ICD-10-CM | POA: Diagnosis not present

## 2018-03-28 DIAGNOSIS — G4726 Circadian rhythm sleep disorder, shift work type: Secondary | ICD-10-CM | POA: Diagnosis not present

## 2018-03-28 DIAGNOSIS — F3181 Bipolar II disorder: Secondary | ICD-10-CM | POA: Diagnosis not present

## 2018-04-20 DIAGNOSIS — G4726 Circadian rhythm sleep disorder, shift work type: Secondary | ICD-10-CM | POA: Diagnosis not present

## 2018-04-20 DIAGNOSIS — F3181 Bipolar II disorder: Secondary | ICD-10-CM | POA: Diagnosis not present

## 2018-04-20 DIAGNOSIS — G2581 Restless legs syndrome: Secondary | ICD-10-CM | POA: Diagnosis not present

## 2018-05-24 DIAGNOSIS — F3181 Bipolar II disorder: Secondary | ICD-10-CM | POA: Diagnosis not present

## 2018-05-24 DIAGNOSIS — G2581 Restless legs syndrome: Secondary | ICD-10-CM | POA: Diagnosis not present

## 2018-05-24 DIAGNOSIS — G4726 Circadian rhythm sleep disorder, shift work type: Secondary | ICD-10-CM | POA: Diagnosis not present

## 2018-06-27 DIAGNOSIS — G4726 Circadian rhythm sleep disorder, shift work type: Secondary | ICD-10-CM | POA: Diagnosis not present

## 2018-06-27 DIAGNOSIS — F3181 Bipolar II disorder: Secondary | ICD-10-CM | POA: Diagnosis not present

## 2018-06-27 DIAGNOSIS — F4321 Adjustment disorder with depressed mood: Secondary | ICD-10-CM | POA: Diagnosis not present

## 2018-06-27 DIAGNOSIS — G2581 Restless legs syndrome: Secondary | ICD-10-CM | POA: Diagnosis not present

## 2018-07-05 DIAGNOSIS — F3181 Bipolar II disorder: Secondary | ICD-10-CM | POA: Diagnosis not present

## 2018-07-05 DIAGNOSIS — G2581 Restless legs syndrome: Secondary | ICD-10-CM | POA: Diagnosis not present

## 2018-07-05 DIAGNOSIS — F4321 Adjustment disorder with depressed mood: Secondary | ICD-10-CM | POA: Diagnosis not present

## 2018-07-14 ENCOUNTER — Encounter: Payer: Self-pay | Admitting: Family Medicine

## 2018-07-14 ENCOUNTER — Ambulatory Visit: Payer: Self-pay | Admitting: *Deleted

## 2018-07-14 ENCOUNTER — Ambulatory Visit (INDEPENDENT_AMBULATORY_CARE_PROVIDER_SITE_OTHER): Payer: PPO | Admitting: Family Medicine

## 2018-07-14 VITALS — BP 124/76 | HR 94 | Temp 98.0°F | Resp 16 | Ht 71.0 in | Wt 235.2 lb

## 2018-07-14 DIAGNOSIS — I739 Peripheral vascular disease, unspecified: Secondary | ICD-10-CM | POA: Diagnosis not present

## 2018-07-14 DIAGNOSIS — K219 Gastro-esophageal reflux disease without esophagitis: Secondary | ICD-10-CM

## 2018-07-14 DIAGNOSIS — R252 Cramp and spasm: Secondary | ICD-10-CM | POA: Diagnosis not present

## 2018-07-14 DIAGNOSIS — F4321 Adjustment disorder with depressed mood: Secondary | ICD-10-CM | POA: Diagnosis not present

## 2018-07-14 DIAGNOSIS — Z23 Encounter for immunization: Secondary | ICD-10-CM | POA: Diagnosis not present

## 2018-07-14 DIAGNOSIS — E559 Vitamin D deficiency, unspecified: Secondary | ICD-10-CM | POA: Diagnosis not present

## 2018-07-14 DIAGNOSIS — E119 Type 2 diabetes mellitus without complications: Secondary | ICD-10-CM

## 2018-07-14 DIAGNOSIS — F3112 Bipolar disorder, current episode manic without psychotic features, moderate: Secondary | ICD-10-CM

## 2018-07-14 DIAGNOSIS — G20 Parkinson's disease: Secondary | ICD-10-CM

## 2018-07-14 DIAGNOSIS — F339 Major depressive disorder, recurrent, unspecified: Secondary | ICD-10-CM

## 2018-07-14 DIAGNOSIS — E7800 Pure hypercholesterolemia, unspecified: Secondary | ICD-10-CM | POA: Diagnosis not present

## 2018-07-14 DIAGNOSIS — I10 Essential (primary) hypertension: Secondary | ICD-10-CM

## 2018-07-14 DIAGNOSIS — G4733 Obstructive sleep apnea (adult) (pediatric): Secondary | ICD-10-CM

## 2018-07-14 LAB — POCT GLYCOSYLATED HEMOGLOBIN (HGB A1C): HbA1c, POC (prediabetic range): 6.3 % (ref 5.7–6.4)

## 2018-07-14 LAB — GLUCOSE, POCT (MANUAL RESULT ENTRY): POC Glucose: 282 mg/dl — AB (ref 70–99)

## 2018-07-14 MED ORDER — QUINAPRIL-HYDROCHLOROTHIAZIDE 20-12.5 MG PO TABS: 1.0000 | ORAL_TABLET | Freq: Every day | ORAL | 3 refills | Status: DC

## 2018-07-14 MED ORDER — EMPAGLIFLOZIN-METFORMIN HCL 12.5-1000 MG PO TABS: 1.0000 | ORAL_TABLET | Freq: Two times a day (BID) | ORAL | 3 refills | Status: DC

## 2018-07-14 MED ORDER — INSULIN DEGLUDEC-LIRAGLUTIDE 100-3.6 UNIT-MG/ML ~~LOC~~ SOPN: 46.0000 [IU] | PEN_INJECTOR | Freq: Every day | SUBCUTANEOUS | 2 refills | Status: DC

## 2018-07-14 NOTE — Patient Instructions (Addendum)
-   Please go to have your diabetic eye examination as soon as possible.  - Decreased Xultophy to 46 units each night. - Continue your other diabetes medications.  - Your goal blood sugar first thing in the morning before eating is between 90-120.  Please call our office if you are below 80 or above 200.   - Call your pharmacy and ask them to send in a refill request of your simvastatin

## 2018-07-14 NOTE — Chronic Care Management (AMB) (Signed)
  Chronic Care Management   Note  07/14/2018 Name: Bryan Flowers MRN: [id number] DOB: 29-May-1961  I met with [name] today at the request of Raelyn Ensign FNP who referred him to the [facility] program for case management support.   Goals Addressed    . "I sure could use someone to help me keep an eye on my diabetes" (pt-stated)       Patient recently moved from ALF to apartment and would appreciate the support of the case management team as he adjusts to independent self health management. Most recent HgA1C = 8.0 with POC glucose in the office today = 282. Patient is taking Synjardy, Glucotrol XL, and Xultophy as prescribed. He relates that he has had at least a few episodes of fasting cbg < 55. He is referred to the CCM team today for follow up on diabetes management.   Clinical Goal(s): Over the next 30 days, patient will reach out to [facility] team and/or provider for needs or questions related to diabetes management.   Interventions: Reviewed CCM support and services with patient; scheduled telephone outreach/follow up   Plan: The CCM team will reach out to [name] over the next 30 days as requested and will be available for any disease management support needed.   [facility] / [facility]  [phone]

## 2018-07-14 NOTE — Patient Instructions (Signed)
The CCM team will reach out to you over the next 30 days. Please contact us if you have any questions or need support.   CCM (Chronic Care Management) Team   [place] Nurse Care Coordinator  [phone]   Ruben Reason PharmD  Clinical Pharmacist  [phone]  [name] was given information about Chronic Care Management services today including:  1. CCM service includes personalized support from designated clinical staff supervised by his physician, including individualized plan of care and coordination with other care providers 2. 24/7 contact phone numbers for assistance for urgent and routine care needs. 3. Service will only be billed when office clinical staff spend 20 minutes or more in a month to coordinate care. 4. Only one practitioner may furnish and bill the service in a calendar month. 5. The patient may stop CCM services at any time (effective at the end of the month) by phone call to the office staff. 6. The patient will be responsible for cost sharing (co-pay) of up to 20% of the service fee (after annual deductible is met).  Patient agreed to services and verbal consent obtained.

## 2018-07-14 NOTE — Progress Notes (Signed)
Name: Bryan Flowers   MRN: [id number]    DOB: 1961-01-12   Date:07/14/2018       Progress Note  Subjective  Chief Complaint  Chief Complaint  Patient presents with  . Hypertension  . Diabetes  . Follow-up    4 month recheck    HPI Pt presents for follow up.    Grief: His wife passed away since last visit. He is staying connected with friends, [name], and family. Says he is not staying isolated. Moved out of assisted living and into an apartment on [address]. He has been doing well taking care of his medicines; starting to cook for himself again.  His biggest issue has been sleeping - this has always been an issue, but it is slightly worse.  Taking a grief class at church.  Bipolar disorder: under the care of Dr. [name] at [facility]. He states his last episode was of depression but feeling well at this time. No suicidal thoughts or ideation.  He saw Dr. Dorethea Clan last week - did not make any medication changes as she feels his symptoms are more situational.  He also sees a counselor at [facility].   Diabetes type II: he has been on Xultophy (50 units), Synjardy (12.5-1000mg ) and Glipizide 2.5mg , most recent A1C is 6.3%.  He does endorse very rare hypoglycemic episodes (lowest he can ever remember is a BG of 51), highest he can remember was around 200.  No polyphagia or polydipsia but has polyuria. He denied side effects from DM medication.  BG today in the office in 282. Uriune micro is UTD, taking ACEI; states he is taking simvastatin  Parkinson's disease: sees neurologist , taking medication, tremors are under control and gait is back to normal. Still under the care of neurologist.   Claudication: seen by Dr. Lucky Cowboy, he used to have a lot of pain that had to made him stop walking; he notes recently he has had some new cramping that feels similar to previous symptoms.  He is not having any back pain with the cramping this time.  He was told during  the last episode of leg pain that it may have been neurogenic claudication since at the time he was having a lot of back pain.  We will check labs, and if no electrolyte abnormality, will refer back to Dr. Lucky Cowboy.   HTN: bp towards low end of normal at last visit - adjusted dose of quinapril hctz from twice daily to once a day; still taking propanolol. No chest pain or palpitation, no shortness of breath, lightheadedness or orthostatic dizziness.    GERD: He has been taking pantoprazole - has history of bleeding ulcers.  He had follow up last year with GI, follows up once a year.  No regurgitation or vomiting, blood in stool, or dark and tarry stools, abdominal pain.  Vitamin D Deficiency - we will check today.  HLD - He reports taking simvastatin, though it is not on his active medications at this time.  Denies chest pain, palpitations, or myalgias aside from the above described muscle cramps.  OSA - uses his CPAP nightly; it works well for him, no daytime sleepiness.   Obesity: Walking 30 minutes every day; cooking himself now - trying not to eat out.  Recommend he continue to make healthy lifestyle changes. Weight is stable.  Patient Active Problem List   Diagnosis Date Noted  . Major depression, recurrent ([place]) 03/14/2018  . Living in assisted  living 12/07/2017  . Bipolar 1 disorder, manic, moderate ([place])   . Vitamin D deficiency 09/10/2017  . GERD (gastroesophageal reflux disease) 09/10/2017  . Claudication ([place]) 07/16/2017  . History of iron deficiency anemia 03/30/2016  . History of iron deficiency anemia 03/24/2016  . History of GI bleed 03/15/2016  . Hyponatremia 03/15/2016  . Parkinson disease ([place])   . Hypertension   . Anxiety 08/19/2015  . Abnormal ECG 04/09/2015  . Back ache 04/09/2015  . Diabetes mellitus type 2 in obese ([place]) 04/09/2015  . Obstructive sleep apnea 04/09/2015  . HDL deficiency 04/09/2015  . Hearing loss, sensorineural, combined types 04/09/2015  . HLD  (hyperlipidemia) 04/09/2015  . Vocal cord atrophy 05/29/2014  . Fibromyalgia 03/15/2014  . Testicular hypofunction 05/17/2009  . Migraine with aura 05/14/2009    Past Surgical History:  Procedure Laterality Date  . ESOPHAGOGASTRODUODENOSCOPY Left 03/16/2016   Procedure: ESOPHAGOGASTRODUODENOSCOPY (EGD);  Surgeon: Manus Gunning, MD;  Location: [facility];  Service: Gastroenterology;  Laterality: Left;  . FOOT FRACTURE SURGERY Right     Family History  Problem Relation Age of Onset  . Cancer Mother   . Heart disease Father   . Arthritis Brother     Social History   Socioeconomic History  . Marital status: Widowed    Spouse name: Not on file  . Number of children: 0  . Years of education: Not on file  . Highest education level: Not on file  Occupational History    Employer: DISABLED  Social Needs  . Financial resource strain: Not hard at all  . Food insecurity:    Worry: Never true    Inability: Never true  . Transportation needs:    Medical: No    Non-medical: No  Tobacco Use  . Smoking status: Never Smoker  . Smokeless tobacco: Never Used  . Tobacco comment: Non-smoker  Substance and Sexual Activity  . Alcohol use: No    Alcohol/week: 0.0 standard drinks  . Drug use: No  . Sexual activity: Never    Partners: Female  Lifestyle  . Physical activity:    Days per week: 7 days    Minutes per session: 30 min  . Stress: Not on file  Relationships  . Social connections:    Talks on phone: Once a week    Gets together: Once a week    Attends religious service: More than 4 times per year    Active member of club or organization: Yes    Attends meetings of clubs or organizations: More than 4 times per year    Relationship status: Married  . Intimate partner violence:    Fear of current or ex partner: No    Emotionally abused: No    Physically abused: No    Forced sexual activity: No  Other Topics Concern  . Not on file  Social History Narrative  .  Not on file     Current Outpatient Medications:  .  acetaminophen (TYLENOL) 500 MG tablet, Take 1,000 mg by mouth every 6 (six) hours as needed (pain)., Disp: , Rfl:  .  Armodafinil 250 MG tablet, Take by mouth., Disp: , Rfl:  .  aspirin (ASPIRIN 81) 81 MG chewable tablet, Chew by mouth., Disp: , Rfl:  .  b complex vitamins capsule, Take 1 capsule by mouth daily., Disp: , Rfl:  .  carbidopa-levodopa (SINEMET CR) 50-200 MG per tablet, Take 1 tablet by mouth 3 (three) times daily. , Disp: , Rfl:  .  DULoxetine (CYMBALTA) 60 MG capsule, Take 120 mg by mouth at bedtime., Disp: , Rfl:  .  Empagliflozin-metFORMIN HCl (SYNJARDY) 12.02-999 MG TABS, Take 1 tablet by mouth 2 (two) times daily at 10 AM and 5 PM., Disp: 60 tablet, Rfl: 3 .  fluticasone (FLONASE) 50 MCG/ACT nasal spray, Place 2 sprays into both nostrils daily., Disp: 16 g, Rfl: 2 .  glipiZIDE (GLUCOTROL XL) 2.5 MG 24 hr tablet, Take 1 tablet (2.5 mg total) by mouth daily with breakfast., Disp: 30 tablet, Rfl: 3 .  Insulin Degludec-Liraglutide (XULTOPHY) 100-3.6 UNIT-MG/ML SOPN, Inject 50 Units into the skin at bedtime., Disp: 15 mL, Rfl: 2 .  lamoTRIgine (LAMICTAL) 200 MG tablet, Take 200 mg by mouth at bedtime., Disp: , Rfl:  .  LORazepam (ATIVAN) 0.5 MG tablet, Take 1 mg by mouth 3 (three) times daily as needed for anxiety., Disp: , Rfl:  .  LYRICA 50 MG capsule, tk 1 capsule at bedtime, Disp: 30 capsule, Rfl: 5 .  ondansetron (ZOFRAN) 4 MG tablet, Take 1 tablet (4 mg total) by mouth every 8 (eight) hours as needed for nausea or vomiting., Disp: 20 tablet, Rfl: 0 .  pantoprazole (PROTONIX) 40 MG tablet, Take 1 tablet (40 mg total) by mouth daily., Disp: 30 tablet, Rfl: 0 .  propranolol ER (INDERAL LA) 60 MG 24 hr capsule, Take 60 mg by mouth at bedtime. , Disp: , Rfl:  .  QUEtiapine (SEROQUEL XR) 400 MG 24 hr tablet, TK 1 T PO QD AT 8PM ONE HOUR BEFORE OR AFTER A MEAL, Disp: , Rfl: 2 .  quinapril-hydrochlorothiazide (ACCURETIC) 20-12.5 MG  tablet, Take 1 tablet by mouth daily., Disp: 30 tablet, Rfl: 3 .  rOPINIRole (REQUIP) 3 MG tablet, Take by mouth., Disp: , Rfl:  .  famotidine (PEPCID) 40 MG tablet, Take 1 tablet (40 mg total) by mouth every evening., Disp: 30 tablet, Rfl: 1  No Known Allergies  I personally reviewed active problem list, medication list, allergies, family history, health maintenance, notes from last encounter with the patient/caregiver today.   ROS  Constitutional: Negative for fever or weight change.  Respiratory: Negative for cough and shortness of breath.   Cardiovascular: Negative for chest pain or palpitations.  Gastrointestinal: Negative for abdominal pain, no bowel changes.  Musculoskeletal: Negative for gait problem or joint swelling.  Skin: Negative for rash.  Neurological: Negative for dizziness or headache.  No other specific complaints in a complete review of systems (except as listed in HPI above).  Objective  Vitals:   07/14/18 1320  BP: 124/76  Pulse: 94  Resp: 16  Temp: 98 F (36.7 C)  TempSrc: Oral  SpO2: 98%  Weight: 235 lb 3.2 oz (106.7 kg)  Height: 5\' 11"  (1.803 m)   Body mass index is 32.8 kg/m.  Physical Exam Constitutional: Patient appears well-developed and well-nourished. No distress.  HENT: Head: Normocephalic and atraumatic. Neck: Normal range of motion. Neck supple. No JVD present. Cardiovascular: Normal rate, regular rhythm and normal heart sounds.  No murmur heard. No BLE edema. Pulmonary/Chest: Effort normal and breath sounds normal. No respiratory distress. Abdominal: Soft. Bowel sounds are normal, no distension. There is no tenderness. No masses. Musculoskeletal: Normal range of motion, no joint effusions. No gross deformities, no calf tenderness or redness bilaterally, no swelling Neurological: Pt is alert and oriented to person, place, and time. No cranial nerve deficit. Coordination, balance, strength, speech and gait are normal.  Skin: Skin is warm  and dry. No rash noted. No  erythema.  Psychiatric: Patient has a normal mood and affect. behavior is normal. Judgment and thought content normal.  Results for orders placed or performed in visit on 07/14/18 (from the past 72 hour(s))  POCT HgB A1C     Status: None   Collection Time: 07/14/18  1:25 PM  Result Value Ref Range   Hemoglobin A1C     HbA1c POC (<> result, manual entry)     HbA1c, POC (prediabetic range) 6.3 5.7 - 6.4 %   HbA1c, POC (controlled diabetic range)    POCT Glucose (CBG)     Status: Abnormal   Collection Time: 07/14/18  1:26 PM  Result Value Ref Range   POC Glucose 282 (A) 70 - 99 mg/dl    PHQ2/9: Depression screen [facility] 2/9 07/14/2018 03/14/2018 11/05/2017 10/11/2017 09/17/2017  Decreased Interest 1 0 0 0 0  Down, Depressed, Hopeless 1 0 0 0 0  PHQ - 2 Score 2 0 0 0 0  Altered sleeping 3 2 - - -  Tired, decreased energy 1 1 - - -  Change in appetite 0 0 - - -  Feeling bad or failure about yourself  0 0 - - -  Trouble concentrating 0 0 - - -  Moving slowly or fidgety/restless 0 0 - - -  Suicidal thoughts 0 0 - - -  PHQ-9 Score 6 3 - - -  Difficult doing work/chores Somewhat difficult Somewhat difficult - - -    Fall Risk: Fall Risk  07/14/2018 03/14/2018 11/05/2017 10/11/2017 09/17/2017  Falls in the past year? No No No No No   Assessment & Plan  1. Grief - Seems to be doing well with counseling, group grief class at church, and good community support. - Ambulatory referral to Chronic Care Management Services  2. Diabetes mellitus with coincident hypertension ([place]) - Concerned that BG's may be too low in the AM - he will monitor daily, keep a log, and CCM team will check in in 30 days; he is able to tell when his BG's are low and will eat a snack when this happens. See AVS for further instructions provided. - POCT HgB A1C - POCT Glucose (CBG) - COMPLETE METABOLIC PANEL WITH GFR - Insulin Degludec-Liraglutide (XULTOPHY) 100-3.6 UNIT-MG/ML SOPN; Inject 46 Units  into the skin at bedtime.  Dispense: 15 mL; Refill: 2 - Empagliflozin-metFORMIN HCl (SYNJARDY) 12.02-999 MG TABS; Take 1 tablet by mouth 2 (two) times daily at 10 AM and 5 PM.  Dispense: 60 tablet; Refill: 3 - quinapril-hydrochlorothiazide (ACCURETIC) 20-12.5 MG tablet; Take 1 tablet by mouth daily.  Dispense: 30 tablet; Refill: 3 - Ambulatory referral to Chronic Care Management Services  3. Bipolar 1 disorder, manic, moderate ([facility]) 4. Recurrent major depressive disorder, remission status unspecified ([place]) - Maintain psychiatry follow up  5. Need for influenza vaccination - Flu Vaccine QUAD 6+ mos PF IM (Fluarix Quad PF)  6. Parkinson disease ([place]) - Maintain Neurology follow up - Ambulatory referral to Chronic Care Management Services  7. Claudication [facility]) - Advised we will check Magnesium and CMP today, if no other explanation of his cramping in labs, will refer back to Dr. Lucky Cowboy.  8. Essential hypertension - quinapril-hydrochlorothiazide (ACCURETIC) 20-12.5 MG tablet; Take 1 tablet by mouth daily.  Dispense: 30 tablet; Refill: 3 - Ambulatory referral to Chronic Care Management Services  9. Gastroesophageal reflux disease without esophagitis - Ambulatory referral to Chronic Care Management Services  10. Pure hypercholesterolemia - Lipid panel - Ambulatory referral to Chronic Care  Management Services  11. Vitamin D deficiency - Vitamin D  12. Muscle cramps - COMPLETE METABOLIC PANEL WITH GFR - Magnesium  13. Obstructive sleep apnea - Continue wearing CPAP

## 2018-07-16 LAB — COMPLETE METABOLIC PANEL WITH GFR
AG Ratio: 1.9 (calc) (ref 1.0–2.5)
ALBUMIN MSPROF: 4.6 g/dL (ref 3.6–5.1)
ALKALINE PHOSPHATASE (APISO): 67 U/L (ref 40–115)
ALT: 15 U/L (ref 9–46)
AST: 13 U/L (ref 10–35)
BUN: 23 mg/dL (ref 7–25)
CO2: 28 mmol/L (ref 20–32)
CREATININE: 0.88 mg/dL (ref 0.70–1.33)
Calcium: 9.8 mg/dL (ref 8.6–10.3)
Chloride: 97 mmol/L — ABNORMAL LOW (ref 98–110)
GFR, Est African American: 111 mL/min/{1.73_m2} (ref >=60)
GFR, Est Non African American: 96 mL/min/{1.73_m2} (ref >=60)
GLUCOSE: 244 mg/dL — AB (ref 65–139)
Globulin: 2.4 g/dL (calc) (ref 1.9–3.7)
Potassium: 4.9 mmol/L (ref 3.5–5.3)
Sodium: 134 mmol/L — ABNORMAL LOW (ref 135–146)
Total Bilirubin: 0.3 mg/dL (ref 0.2–1.2)
Total Protein: 7 g/dL (ref 6.1–8.1)

## 2018-07-16 LAB — LIPID PANEL
Cholesterol: 152 mg/dL (ref <200)
HDL: 32 mg/dL — AB (ref >40)
LDL CHOLESTEROL (CALC): 83 mg/dL
Non-HDL Cholesterol (Calc): 120 mg/dL (calc) (ref <130)
TRIGLYCERIDES: 305 mg/dL — AB (ref <150)
Total CHOL/HDL Ratio: 4.8 (calc) (ref <5.0)

## 2018-07-16 LAB — TEST AUTHORIZATION

## 2018-07-16 LAB — MAGNESIUM: MAGNESIUM: 2.2 mg/dL (ref 1.5–2.5)

## 2018-07-16 LAB — VITAMIN D 25 HYDROXY (VIT D DEFICIENCY, FRACTURES): VIT D 25 HYDROXY: 26 ng/mL — AB (ref 30–100)

## 2018-08-02 ENCOUNTER — Telehealth: Payer: Self-pay

## 2018-08-02 ENCOUNTER — Ambulatory Visit: Payer: Self-pay

## 2018-08-02 DIAGNOSIS — I10 Essential (primary) hypertension: Secondary | ICD-10-CM

## 2018-08-02 DIAGNOSIS — E119 Type 2 diabetes mellitus without complications: Secondary | ICD-10-CM

## 2018-08-02 NOTE — Chronic Care Management (AMB) (Signed)
  Care Management   Follow Up Note   08/02/2018 Name: Bryan Flowers MRN: [id number] DOB: August 12, 1961  Referred by: Hubbard Hartshorn, FNP Reason for referral : Chronic Care Management (follow up-DM)   Subjective: "I think I am doing much better with managing my sugars than I was"   Objective:  Lab Results  Component Value Date   HGBA1C 6.3 07/14/2018     Assessment: Bryan Flowers is a 57 year old male patient that was referred by his PCP Yvonna Alanis FNP for case management support. Patient recently moved from ALF to apartment and needed the support of the case management team as he adjusts to independent self health management. Bryan Flowers specifically felt at the time of initial contact he could benefit from support with DM management. Today, he feels he is doing much better.  Goals Addressed    . COMPLETED: "I sure could use someone to help me keep an eye on my diabetes" (pt-stated)       Patient's reported fasting CBG readings are between 100-120s. He continues to take his medications as prescribed and denies needs for medication assistance at this time. He continue to adhere to an ADA diet and feels his sugars are much better controlled with more consistent meals.   Clinical Goal(s): Patient will contact the CCM team for support as needed  Interventions: Reviewed Care Management services with patient and again provided contact information for CCM team. Instructed patient to contact Care Management Team as needed for additional care coordination/chronic care management needs in the future.     Plan: CCM team will be available to assist Bryan Flowers with any care coordination/chronic care management needs that may arise in the future.    Jasira Robinson E. Rollene Rotunda, RN, BSN Nurse Care Coordinator [facility] Care Management  [phone]

## 2018-08-02 NOTE — Patient Instructions (Signed)
1. Continue to check your fasting CBGs daily as instructed and seek medical care with any symptomatic Hypo/Hyperglycemic episodes that do not respond quickly to treatment.  2. Continue to take all medications as prescribed.  3. Continue to follow your current ADA diet. You are doing a great job!!  4. Contact the CCM team for any questions/concerns/educations needs regarding your chronic health conditions.   CCM (Chronic Care Management) Team   Trish Fountain RN, BSN Nurse Care Coordinator  [phone]  Ruben Reason PharmD  Clinical Pharmacist  [phone]

## 2018-08-08 ENCOUNTER — Other Ambulatory Visit: Payer: Self-pay | Admitting: Family Medicine

## 2018-08-08 MED ORDER — INSULIN PEN NEEDLE 32G X 6 MM MISC: 1.0000 | Freq: Every day | 3 refills | Status: DC

## 2018-08-08 NOTE — Telephone Encounter (Signed)
Sent Rx to [place] [facility]

## 2018-08-08 NOTE — Telephone Encounter (Signed)
Please send in a script for size needles

## 2018-08-08 NOTE — Telephone Encounter (Signed)
Pt was prescribed xultophy but is needing a prescription for the needles. He is completely out. It is expensive to get them OTC. Do we have any samples for the needles that he can have until the prescription has been called in. [phone]

## 2018-08-12 MED ORDER — INSULIN PEN NEEDLE 32G X 6 MM MISC: 1.0000 | Freq: Every day | 3 refills | Status: DC

## 2018-08-12 NOTE — Telephone Encounter (Signed)
Pt needed this script to go to  [place], [place] [phone] (Phone) [phone] (Fax)

## 2018-08-12 NOTE — Addendum Note (Signed)
Addended by: Raelyn Ensign E on: 08/12/2018 02:00 PM   Modules accepted: Orders

## 2018-08-12 NOTE — Addendum Note (Signed)
Addended by: Zakaria Sedor, Ulla Potash on: 08/12/2018 01:37 PM   Modules accepted: Orders

## 2018-09-22 ENCOUNTER — Ambulatory Visit (INDEPENDENT_AMBULATORY_CARE_PROVIDER_SITE_OTHER): Payer: PPO

## 2018-09-22 VITALS — BP 144/92 | HR 85 | Temp 97.9°F | Resp 16 | Ht 71.0 in | Wt 235.4 lb

## 2018-09-22 DIAGNOSIS — Z0000 Encounter for general adult medical examination without abnormal findings: Secondary | ICD-10-CM | POA: Diagnosis not present

## 2018-09-22 NOTE — Patient Instructions (Signed)
Bryan Flowers , Thank you for taking time to come for your Medicare Wellness Visit. I appreciate your ongoing commitment to your health goals. Please review the following plan we discussed and let me know if I can assist you in the future.   Screening recommendations/referrals: Colonoscopy: done 06/01/16 repeat in August 2020 Recommended yearly ophthalmology/optometry visit for glaucoma screening and checkup Recommended yearly dental visit for hygiene and checkup  Vaccinations: Influenza vaccine: done 07/14/18 Pneumococcal vaccine: done 09/08/17 Tdap vaccine: done 04/23/11 Shingles vaccine: Shingrix discussed. Please contact your pharmacy for coverage information.     Advanced directives: Advance directive discussed with you today. I have provided a copy for you to complete at home and have notarized. Once this is complete please bring a copy in to our office so we can scan it into your chart.  Conditions/risks identified: Recommend joining silver sneakers to increase physical activity.   Next appointment: Please follow up in one year for your Medicare Annual Wellness visit.    Preventive Care 40-64 Years, Male Preventive care refers to lifestyle choices and visits with your health care provider that can promote health and wellness. What does preventive care include?  A yearly physical exam. This is also called an annual well check.  Dental exams once or twice a year.  Routine eye exams. Ask your health care provider how often you should have your eyes checked.  Personal lifestyle choices, including:  Daily care of your teeth and gums.  Regular physical activity.  Eating a healthy diet.  Avoiding tobacco and drug use.  Limiting alcohol use.  Practicing safe sex.  Taking low-dose aspirin every day starting at age 67. What happens during an annual well check? The services and screenings done by your health care provider during your annual well check will depend on your age,  overall health, lifestyle risk factors, and family history of disease. Counseling  Your health care provider may ask you questions about your:  Alcohol use.  Tobacco use.  Drug use.  Emotional well-being.  Home and relationship well-being.  Sexual activity.  Eating habits.  Work and work Statistician. Screening  You may have the following tests or measurements:  Height, weight, and BMI.  Blood pressure.  Lipid and cholesterol levels. These may be checked every 5 years, or more frequently if you are over 50 years old.  Skin check.  Lung cancer screening. You may have this screening every year starting at age 15 if you have a 30-pack-year history of smoking and currently smoke or have quit within the past 15 years.  Fecal occult blood test (FOBT) of the stool. You may have this test every year starting at age 82.  Flexible sigmoidoscopy or colonoscopy. You may have a sigmoidoscopy every 5 years or a colonoscopy every 10 years starting at age 80.  Prostate cancer screening. Recommendations will vary depending on your family history and other risks.  Hepatitis C blood test.  Hepatitis B blood test.  Sexually transmitted disease (STD) testing.  Diabetes screening. This is done by checking your blood sugar (glucose) after you have not eaten for a while (fasting). You may have this done every 1-3 years. Discuss your test results, treatment options, and if necessary, the need for more tests with your health care provider. Vaccines  Your health care provider may recommend certain vaccines, such as:  Influenza vaccine. This is recommended every year.  Tetanus, diphtheria, and acellular pertussis (Tdap, Td) vaccine. You may need a Td booster every 10 years.  Zoster vaccine. You may need this after age 25.  Pneumococcal 13-valent conjugate (PCV13) vaccine. You may need this if you have certain conditions and have not been vaccinated.  Pneumococcal polysaccharide (PPSV23)  vaccine. You may need one or two doses if you smoke cigarettes or if you have certain conditions. Talk to your health care provider about which screenings and vaccines you need and how often you need them. This information is not intended to replace advice given to you by your health care provider. Make sure you discuss any questions you have with your health care provider. Document Released: 10/18/2015 Document Revised: 06/10/2016 Document Reviewed: 07/23/2015 Elsevier Interactive Patient Education  2017 [place] Prevention in the Home Falls can cause injuries. They can happen to people of all ages. There are many things you can do to make your home safe and to help prevent falls. What can I do on the outside of my home?  Regularly fix the edges of walkways and driveways and fix any cracks.  Remove anything that might make you trip as you walk through a door, such as a raised step or threshold.  Trim any bushes or trees on the path to your home.  Use bright outdoor lighting.  Clear any walking paths of anything that might make someone trip, such as rocks or tools.  Regularly check to see if handrails are loose or broken. Make sure that both sides of any steps have handrails.  Any raised decks and porches should have guardrails on the edges.  Have any leaves, snow, or ice cleared regularly.  Use sand or salt on walking paths during winter.  Clean up any spills in your garage right away. This includes oil or grease spills. What can I do in the bathroom?  Use night lights.  Install grab bars by the toilet and in the tub and shower. Do not use towel bars as grab bars.  Use non-skid mats or decals in the tub or shower.  If you need to sit down in the shower, use a plastic, non-slip stool.  Keep the floor dry. Clean up any water that spills on the floor as soon as it happens.  Remove soap buildup in the tub or shower regularly.  Attach bath mats securely with  double-sided non-slip rug tape.  Do not have throw rugs and other things on the floor that can make you trip. What can I do in the bedroom?  Use night lights.  Make sure that you have a light by your bed that is easy to reach.  Do not use any sheets or blankets that are too big for your bed. They should not hang down onto the floor.  Have a firm chair that has side arms. You can use this for support while you get dressed.  Do not have throw rugs and other things on the floor that can make you trip. What can I do in the kitchen?  Clean up any spills right away.  Avoid walking on wet floors.  Keep items that you use a lot in easy-to-reach places.  If you need to reach something above you, use a strong step stool that has a grab bar.  Keep electrical cords out of the way.  Do not use floor polish or wax that makes floors slippery. If you must use wax, use non-skid floor wax.  Do not have throw rugs and other things on the floor that can make you trip. What can I do with  my stairs?  Do not leave any items on the stairs.  Make sure that there are handrails on both sides of the stairs and use them. Fix handrails that are broken or loose. Make sure that handrails are as long as the stairways.  Check any carpeting to make sure that it is firmly attached to the stairs. Fix any carpet that is loose or worn.  Avoid having throw rugs at the top or bottom of the stairs. If you do have throw rugs, attach them to the floor with carpet tape.  Make sure that you have a light switch at the top of the stairs and the bottom of the stairs. If you do not have them, ask someone to add them for you. What else can I do to help prevent falls?  Wear shoes that:  Do not have high heels.  Have rubber bottoms.  Are comfortable and fit you well.  Are closed at the toe. Do not wear sandals.  If you use a stepladder:  Make sure that it is fully opened. Do not climb a closed stepladder.  Make  sure that both sides of the stepladder are locked into place.  Ask someone to hold it for you, if possible.  Clearly mark and make sure that you can see:  Any grab bars or handrails.  First and last steps.  Where the edge of each step is.  Use tools that help you move around (mobility aids) if they are needed. These include:  Canes.  Walkers.  Scooters.  Crutches.  Turn on the lights when you go into a dark area. Replace any light bulbs as soon as they burn out.  Set up your furniture so you have a clear path. Avoid moving your furniture around.  If any of your floors are uneven, fix them.  If there are any pets around you, be aware of where they are.  Review your medicines with your doctor. Some medicines can make you feel dizzy. This can increase your chance of falling. Ask your doctor what other things that you can do to help prevent falls. This information is not intended to replace advice given to you by your health care provider. Make sure you discuss any questions you have with your health care provider. Document Released: 07/18/2009 Document Revised: 02/27/2016 Document Reviewed: 10/26/2014 Elsevier Interactive Patient Education  2017 [employer].

## 2018-09-22 NOTE — Progress Notes (Signed)
Subjective:   Bryan Flowers is a 57 y.o. male who presents for Medicare Annual/Subsequent preventive examination.  Review of Systems:   Cardiac Risk Factors include: advanced age (>25men, >69 women);dyslipidemia;hypertension;obesity (BMI >30kg/m2)     Objective:    Vitals: BP (!) 144/92 (BP Location: Left Arm, Patient Position: Sitting, Cuff Size: Normal)   Pulse 85   Temp 97.9 F (36.6 C) (Oral)   Resp 16   Ht 5\' 11"  (1.803 m)   Wt 235 lb 6.4 oz (106.8 kg)   SpO2 98%   BMI 32.83 kg/m   Body mass index is 32.83 kg/m.  Advanced Directives 09/08/2017 07/06/2017 06/17/2017 06/09/2017 04/05/2017 03/30/2017 03/23/2017  Does Patient Have a Medical Advance Directive? Yes No No No Yes Yes Yes  Type of Advance Directive [place]  Does patient want to make changes to medical advance directive? - - - - - - -  Copy of [place] in Chart? Yes - - - - - -  Would patient like information on creating a medical advance directive? - - - - - - -    Tobacco Social History   Tobacco Use  Smoking Status Never Smoker  Smokeless Tobacco Never Used  Tobacco Comment   Non-smoker     Counseling given: Not Answered Comment: Non-smoker   Clinical Intake:  Pre-visit preparation completed: Yes  Pain : 0-10 Pain Score: 5  Pain Type: Chronic pain Pain Location: Back Pain Orientation: Lower Pain Descriptors / Indicators: Aching, Sore Pain Onset: More than a month ago Pain Frequency: Constant Pain Relieving Factors: tylenol  Pain Relieving Factors: tylenol  Nutritional Status: BMI > 30  Obese Nutritional Risks: Nausea/ vomitting/ diarrhea(diarrhea the last few days ) Diabetes: Yes CBG done?: No Did pt. bring in CBG monitor from home?: No   Nutrition Risk Assessment:  Has the patient had any N/V/D within the last 2 months?  Yes  Does the patient have any non-healing wounds?  No  Has  the patient had any unintentional weight loss or weight gain?  No   Diabetes:  Is the patient diabetic?  Yes  If diabetic, was a CBG obtained today?  No  Did the patient bring in their glucometer from home?  No  How often do you monitor your CBG's? Daily fasting in morning ranging from 100 to 120 per pt.   Financial Strains and Diabetes Management:  Are you having any financial strains with the device, your supplies or your medication? No .  Does the patient want to be seen by Chronic Care Management for management of their diabetes?  Yes  current CCM pt for diabetes Would the patient like to be referred to a Nutritionist or for Diabetic Management?  No   Diabetic Exams:  Diabetic Eye Exam: Completed 05/06/17 negative retinopathy. Overdue for diabetic eye exam. Pt scheduled for appt Feb 2020  Diabetic Foot Exam: Completed 07/06/17. Pt has been advised about the importance in completing this exam. Pt is scheduled for diabetic foot exam on 11/14/18.   What is the last grade level you completed in school?: some college  Interpreter Needed?: No  Information entered by :: Clemetine Marker LPN  Past Medical History:  Diagnosis Date  . Anemia    iron deficiency  . Anxiety   . Bipolar 1 disorder, manic, moderate ([place])   . Depression   . Diabetes mellitus without complication ([place])   .  GERD (gastroesophageal reflux disease)   . Hyperlipidemia   . Hypertension   . Insomnia   . Parkinson disease ([place])   . Peptic ulcer disease with hemorrhage   . Sleep apnea    Past Surgical History:  Procedure Laterality Date  . ESOPHAGOGASTRODUODENOSCOPY Left 03/16/2016   Procedure: ESOPHAGOGASTRODUODENOSCOPY (EGD);  Surgeon: Manus Gunning, MD;  Location: [facility];  Service: Gastroenterology;  Laterality: Left;  . FOOT FRACTURE SURGERY Right    Family History  Problem Relation Age of Onset  . Cancer Mother   . Heart disease Father   . Arthritis Brother    Social History    Socioeconomic History  . Marital status: Widowed    Spouse name: Not on file  . Number of children: 0  . Years of education: Not on file  . Highest education level: High school graduate  Occupational History    Employer: DISABLED  Social Needs  . Financial resource strain: Not hard at all  . Food insecurity:    Worry: Never true    Inability: Never true  . Transportation needs:    Medical: No    Non-medical: No  Tobacco Use  . Smoking status: Never Smoker  . Smokeless tobacco: Never Used  . Tobacco comment: Non-smoker  Substance and Sexual Activity  . Alcohol use: No    Alcohol/week: 0.0 standard drinks  . Drug use: No  . Sexual activity: Not Currently    Partners: Female  Lifestyle  . Physical activity:    Days per week: 0 days    Minutes per session: 0 min  . Stress: Only a little  Relationships  . Social connections:    Talks on phone: More than three times a week    Gets together: More than three times a week    Attends religious service: More than 4 times per year    Active member of club or organization: Yes    Attends meetings of clubs or organizations: More than 4 times per year    Relationship status: Widowed  Other Topics Concern  . Not on file  Social History Narrative  . Not on file    Outpatient Encounter Medications as of 09/22/2018  Medication Sig  . acetaminophen (TYLENOL) 500 MG tablet Take 1,000 mg by mouth every 6 (six) hours as needed (pain).  . Armodafinil 250 MG tablet Take by mouth.  Marland Kitchen b complex vitamins capsule Take 1 capsule by mouth daily.  . carbidopa-levodopa (SINEMET CR) 50-200 MG per tablet Take 1 tablet by mouth 3 (three) times daily.   . DULoxetine (CYMBALTA) 60 MG capsule Take 120 mg by mouth at bedtime.  . Empagliflozin-metFORMIN HCl (SYNJARDY) 12.02-999 MG TABS Take 1 tablet by mouth 2 (two) times daily at 10 AM and 5 PM.  . glipiZIDE (GLUCOTROL XL) 2.5 MG 24 hr tablet Take 1 tablet (2.5 mg total) by mouth daily with  breakfast.  . Insulin Degludec-Liraglutide (XULTOPHY) 100-3.6 UNIT-MG/ML SOPN Inject 46 Units into the skin at bedtime.  . Insulin Pen Needle (NOVOFINE) 32G X 6 MM MISC 1 each by Does not apply route daily.  Marland Kitchen lamoTRIgine (LAMICTAL) 200 MG tablet Take 200 mg by mouth at bedtime.  Marland Kitchen LORazepam (ATIVAN) 0.5 MG tablet Take 1 mg by mouth 3 (three) times daily as needed for anxiety.  Marland Kitchen LYRICA 50 MG capsule tk 1 capsule at bedtime  . ondansetron (ZOFRAN) 4 MG tablet Take 1 tablet (4 mg total) by mouth every 8 (eight) hours as needed  for nausea or vomiting.  . pantoprazole (PROTONIX) 40 MG tablet Take 1 tablet (40 mg total) by mouth daily.  . propranolol ER (INDERAL LA) 60 MG 24 hr capsule Take 60 mg by mouth at bedtime.   Marland Kitchen QUEtiapine (SEROQUEL XR) 400 MG 24 hr tablet TK 1 T PO QD AT 8PM ONE HOUR BEFORE OR AFTER A MEAL  . quinapril-hydrochlorothiazide (ACCURETIC) 20-12.5 MG tablet Take 1 tablet by mouth daily.  Marland Kitchen rOPINIRole (REQUIP) 3 MG tablet Take by mouth.  Marland Kitchen aspirin (ASPIRIN 81) 81 MG chewable tablet Chew by mouth.  . fluticasone (FLONASE) 50 MCG/ACT nasal spray Place 2 sprays into both nostrils daily. (Patient not taking: Reported on 09/22/2018)   No facility-administered encounter medications on file as of 09/22/2018.     Activities of Daily Living In your present state of health, do you have any difficulty performing the following activities: 09/22/2018 03/14/2018  Hearing? Y Y  Comment hearing aid for right ear hearing aid in right ear  Vision? N Y  Comment wears glasses prescription glasses  Difficulty concentrating or making decisions? N N  Walking or climbing stairs? N N  Comment - -  Dressing or bathing? N N  Doing errands, shopping? N N  Preparing Food and eating ? N -  Using the Toilet? N -  In the past six months, have you accidently leaked urine? N -  Do you have problems with loss of bowel control? N -  Managing your Medications? N -  Managing your Finances? N -   Housekeeping or managing your Housekeeping? N -  Some recent data might be hidden    Patient Care Team: Hubbard Hartshorn, FNP as PCP - General (Family Medicine) Madelyn Brunner, MD as Consulting Physician (Neurology) [name], NP as Consulting Physician Kary Kos Blair Heys, [facility] as Pharmacist (Pharmacist) Benedetto Goad, RN as Case Manager   Assessment:   This is a routine wellness examination for Bryan Flowers.  Exercise Activities and Dietary recommendations Current Exercise Habits: The patient does not participate in regular exercise at present, Exercise limited by: neurologic condition(s)(parkinsons, fibromyalgia)  Goals    . Increase physical activity     Pt wants to increase physical activity and plans to start by walking more.        Fall Risk Fall Risk  09/22/2018 07/14/2018 03/14/2018 11/05/2017 10/11/2017  Falls in the past year? 1 No No No No  Number falls in past yr: 1 - - - -  Comment 1 fall onto grass while helping to move a couch with his brother  - - - -  Injury with Fall? 0 - - - -  Follow up Falls evaluation completed;Falls prevention discussed - - - -   FALL RISK PREVENTION PERTAINING TO THE HOME:  Any stairs in or around the home WITH handrails? No  Home free of loose throw rugs in walkways, pet beds, electrical cords, etc? Yes  Adequate lighting in your home to reduce risk of falls? Yes   ASSISTIVE DEVICES UTILIZED TO PREVENT FALLS:  Life alert? No  Use of a cane, walker or w/c? No  Grab bars in the bathroom? Yes  Shower chair or bench in shower? No  Elevated toilet seat or a handicapped toilet? Yes   DME ORDERS:  DME order needed?  No   TIMED UP AND GO:  Was the test performed? Yes .  Length of time to ambulate 10 feet: 6 sec.   GAIT:  Appearance of gait: Gait  stead-fast and without the use of an assistive device. Education: Fall risk prevention has been discussed.  Intervention(s) required? No   Depression Screen PHQ 2/9 Scores 09/22/2018  07/14/2018 03/14/2018 11/05/2017  PHQ - 2 Score 2 2 0 0  PHQ- 9 Score 4 6 3  -    Cognitive Function     6CIT Screen 09/08/2017  What Year? 0 points  What month? 0 points  What time? 3 points  Count back from 20 0 points  Months in reverse 0 points  Repeat phrase 0 points  Total Score 3    Immunization History  Administered Date(s) Administered  . Influenza, Seasonal, Injecte, Preservative Fre 06/19/2010, 07/07/2012  . Influenza,inj,Quad PF,6+ Mos 08/02/2013, 07/10/2014, 11/02/2016, 07/06/2017, 07/14/2018  . Pneumococcal Polysaccharide-23 07/07/2012, 09/08/2017  . Tdap 04/23/2011    Qualifies for Shingles Vaccine? Yes . Due for Shingrix. Education has been provided regarding the importance of this vaccine. Pt has been advised to call insurance company to determine out of pocket expense. Advised may also receive vaccine at local pharmacy or Health Dept. Verbalized acceptance and understanding.  Tdap: Up to date   Flu Vaccine: Up to date   Pneumococcal Vaccine: Up to date   Screening Tests Health Maintenance  Topic Date Due  . OPHTHALMOLOGY EXAM  05/06/2018  . FOOT EXAM  07/06/2018  . HEMOGLOBIN A1C  01/13/2019  . COLONOSCOPY  06/02/2019  . TETANUS/TDAP  04/22/2021  . INFLUENZA VACCINE  Completed  . PNEUMOCOCCAL POLYSACCHARIDE VACCINE AGE 6-64 HIGH RISK  Completed  . Hepatitis C Screening  Completed  . HIV Screening  Completed   Cancer Screenings:  Colorectal Screening: Completed 06/01/16. Repeat every 3 years; No longer required.   Lung Cancer Screening: (Low Dose CT Chest recommended if Age 75-80 years, 30 pack-year currently smoking OR have quit w/in 15years.) does not qualify.    Additional Screening:  Hepatitis C Screening: does qualify; Completed 09/08/17  Vision Screening: Recommended annual ophthalmology exams for early detection of glaucoma and other disorders of the eye. Is the patient up to date with their annual eye exam?  No  Who is the provider or  what is the name of the office in which the pt attends annual eye exams? Dr. Marvel Plan. appt scheduled for Feb 2020  Dental Screening: Recommended annual dental exams for proper oral hygiene  Community Resource Referral:  CRR required this visit?  No       Plan:    I have personally reviewed and addressed the Medicare Annual Wellness questionnaire and have noted the following in the patient's chart:  A. Medical and social history B. Use of alcohol, tobacco or illicit drugs  C. Current medications and supplements D. Functional ability and status E.  Nutritional status F.  Physical activity G. Advance directives H. List of other physicians I.  Hospitalizations, surgeries, and ER visits in previous 12 months J.  [place] such as hearing and vision if needed, cognitive and depression L. Referrals and appointments   In addition, I have reviewed and discussed with patient certain preventive protocols, quality metrics, and best practice recommendations. A written personalized care plan for preventive services as well as general preventive health recommendations were provided to patient.   Signed,  Clemetine Marker, LPN Nurse Health Advisor   Nurse Notes: Pt doing well and appreciative of visit today. He is still grieving the loss of his wife of 25 years and having a hard time during the holidays but depression maintained on medication. Advised we  would provide community resources if pt is interested in support groups or counseling, he declines at this time.

## 2018-11-10 ENCOUNTER — Other Ambulatory Visit: Payer: Self-pay

## 2018-11-10 NOTE — Patient Outreach (Signed)
[facility]) Care Management  11/10/2018  CHAKA JEFFERYS 05-22-1961 [id number]   Medication Adherence yo Mr. Casanova Schurman left a message for patient to call back.   [facility] Management Direct Dial [phone]  Fax [phone] [email or web address]

## 2018-11-14 ENCOUNTER — Ambulatory Visit (INDEPENDENT_AMBULATORY_CARE_PROVIDER_SITE_OTHER): Payer: PPO | Admitting: Family Medicine

## 2018-11-14 ENCOUNTER — Encounter: Payer: Self-pay | Admitting: Family Medicine

## 2018-11-14 ENCOUNTER — Other Ambulatory Visit: Payer: Self-pay | Admitting: Family Medicine

## 2018-11-14 VITALS — BP 142/86 | HR 115 | Temp 98.1°F | Resp 16 | Ht 71.0 in | Wt 234.4 lb

## 2018-11-14 DIAGNOSIS — F419 Anxiety disorder, unspecified: Secondary | ICD-10-CM

## 2018-11-14 DIAGNOSIS — G4733 Obstructive sleep apnea (adult) (pediatric): Secondary | ICD-10-CM | POA: Diagnosis not present

## 2018-11-14 DIAGNOSIS — F3112 Bipolar disorder, current episode manic without psychotic features, moderate: Secondary | ICD-10-CM

## 2018-11-14 DIAGNOSIS — Z8719 Personal history of other diseases of the digestive system: Secondary | ICD-10-CM

## 2018-11-14 DIAGNOSIS — E119 Type 2 diabetes mellitus without complications: Secondary | ICD-10-CM

## 2018-11-14 DIAGNOSIS — G20 Parkinson's disease: Secondary | ICD-10-CM | POA: Diagnosis not present

## 2018-11-14 DIAGNOSIS — E1169 Type 2 diabetes mellitus with other specified complication: Secondary | ICD-10-CM

## 2018-11-14 DIAGNOSIS — I10 Essential (primary) hypertension: Secondary | ICD-10-CM

## 2018-11-14 DIAGNOSIS — Z862 Personal history of diseases of the blood and blood-forming organs and certain disorders involving the immune mechanism: Secondary | ICD-10-CM

## 2018-11-14 DIAGNOSIS — E559 Vitamin D deficiency, unspecified: Secondary | ICD-10-CM

## 2018-11-14 DIAGNOSIS — K219 Gastro-esophageal reflux disease without esophagitis: Secondary | ICD-10-CM

## 2018-11-14 DIAGNOSIS — M797 Fibromyalgia: Secondary | ICD-10-CM | POA: Diagnosis not present

## 2018-11-14 DIAGNOSIS — E7800 Pure hypercholesterolemia, unspecified: Secondary | ICD-10-CM | POA: Diagnosis not present

## 2018-11-14 DIAGNOSIS — K2970 Gastritis, unspecified, without bleeding: Secondary | ICD-10-CM

## 2018-11-14 DIAGNOSIS — K2990 Gastroduodenitis, unspecified, without bleeding: Principal | ICD-10-CM

## 2018-11-14 DIAGNOSIS — F339 Major depressive disorder, recurrent, unspecified: Secondary | ICD-10-CM

## 2018-11-14 DIAGNOSIS — J0140 Acute pansinusitis, unspecified: Secondary | ICD-10-CM

## 2018-11-14 DIAGNOSIS — E669 Obesity, unspecified: Secondary | ICD-10-CM

## 2018-11-14 DIAGNOSIS — G20A1 Parkinson's disease without dyskinesia, without mention of fluctuations: Secondary | ICD-10-CM

## 2018-11-14 LAB — POCT GLYCOSYLATED HEMOGLOBIN (HGB A1C): HbA1c, POC (controlled diabetic range): 10.5 % — AB (ref 0.0–7.0)

## 2018-11-14 LAB — GLUCOSE, POCT (MANUAL RESULT ENTRY): POC Glucose: 282 mg/dl — AB (ref 70–99)

## 2018-11-14 MED ORDER — INSULIN DEGLUDEC-LIRAGLUTIDE 100-3.6 UNIT-MG/ML ~~LOC~~ SOPN: 46.0000 [IU] | PEN_INJECTOR | Freq: Every day | SUBCUTANEOUS | 2 refills | Status: DC

## 2018-11-14 MED ORDER — ATORVASTATIN CALCIUM 20 MG PO TABS: 20.0000 mg | ORAL_TABLET | Freq: Every day | ORAL | 3 refills | Status: DC

## 2018-11-14 MED ORDER — PANTOPRAZOLE SODIUM 40 MG PO TBEC: 40.0000 mg | DELAYED_RELEASE_TABLET | Freq: Every day | ORAL | 1 refills | Status: DC

## 2018-11-14 MED ORDER — EMPAGLIFLOZIN-METFORMIN HCL 12.5-1000 MG PO TABS: 1.0000 | ORAL_TABLET | Freq: Two times a day (BID) | ORAL | 3 refills | Status: DC

## 2018-11-14 MED ORDER — QUINAPRIL-HYDROCHLOROTHIAZIDE 20-12.5 MG PO TABS: 1.0000 | ORAL_TABLET | Freq: Every day | ORAL | 3 refills | Status: DC

## 2018-11-14 MED ORDER — AMOXICILLIN-POT CLAVULANATE 875-125 MG PO TABS: 1.0000 | ORAL_TABLET | Freq: Two times a day (BID) | ORAL | 0 refills | Status: AC

## 2018-11-14 MED ORDER — LYRICA 50 MG PO CAPS: ORAL_CAPSULE | ORAL | 5 refills | Status: DC

## 2018-11-14 MED ORDER — GLIPIZIDE ER 5 MG PO TB24: 5.0000 mg | ORAL_TABLET | Freq: Every day | ORAL | 5 refills | Status: DC

## 2018-11-14 MED ORDER — INSULIN PEN NEEDLE 32G X 6 MM MISC: 1.0000 | Freq: Every day | 3 refills | Status: DC

## 2018-11-14 MED ORDER — GLUCOSE BLOOD VI STRP: ORAL_STRIP | 12 refills | Status: DC

## 2018-11-14 NOTE — Progress Notes (Signed)
Established Patient Office Visit  Subjective:  Patient ID: Bryan Flowers, male    DOB: [birth year]  Age: 58 y.o. MRN: [id number]  CC:  Chief Complaint  Patient presents with  . Follow-up    4 month recheck  . Diabetes  . Hypertension  . Insomnia  . URI    sneezing, congested, scratchy throat for 1 wwek    HPI Bryan Flowers presents for follow up:  URI: He presents with concern for >7 days of URI symptoms with worsening sinus pain, pressure, and congestion.  Also has sore throat, hoarse voice, fatigue, some body aches, dry raspy cough, subjective fevers and chills.  No chest pain, shortness of breath, abdominal pain.   Grief: His wife passed away since last visit. He is staying connected with friends, [name], and family. Says he is not staying isolated. Moved out of assisted living and into an apartment on [address]. He has been doing well taking care of his medicines; starting to cook for himself again.  His biggest issue has been sleeping - this has always been an issue, but it is slightly worse since her death.  His wife's daughter has control of his finances due to giving up POA when his wife was in a SNF.  He is working on revoking his POA now.  Taking a grief class at church.   Bipolar disorder: under the care of Dr. [name] at [facility]. He states his last episode was of depression but feeling well at this time, has been feeling very level. No suicidal thoughts or ideation.  He saw Dr. Dorethea Clan last week - did not make any medication changes as she feels his symptoms are more situational.  He also sees a counselor at [facility].   Diabetes type II: he has been on Xultophy (50 units), Synjardy (12.5-1000mg )and Glipizide 2.5mg , last  A1C was 6.3% - Today he is 10.5%.  Lowest -Highest Range - 80's - 220 fasting.  No polyphagia. Does endorse polydipsia and polyuria. He denied side effects from DM medication.  BG today in the office in 282.  Uriune micro is UTD, taking ACEI; will restart statin therapy. Had two McMuffins from [employer]. Needs Eye examination and foot exam today. Poor diet, declines nutritionist.  Parkinson's disease: sees Dr. [name], taking medication, tremors are under control and gait is back to normal. Still under the care of neurologist and doing well. Taking requip for restless leg and on sinemet for Parkinson's.  FMS: He is taking lyrica 50mg  QHS and this seems to be much more effective than gabapentin that he was on before.  Aside from current URI, his fatigue has been well controlled.   HTN: bp towards upper end of normal today - adjusted dose of quinapril hctz from twice daily to once a day; still taking propanolol. No chest pain or palpitation, no shortness of breath, lightheadedness or orthostatic dizziness, occassional mild dependent BLE edema.  GERD: He had been taking pantoprazole, but the Rx ran out and the GERD came back - has history of bleeding ulcers.  He had follow up last year with GI, follows up once a year.  No regurgitation or vomiting, blood in stool, or dark and tarry stools, abdominal pain.  Vitamin D Deficiency - Low at last visit - taking supplementation now - 1000iu daily  HLD - He stopped simvastatin, though  He is unsure of why.  Last lipids were not at goal. We will restart but on atorvastatin. Denies  chest pain, palpitations, no myalgias.   OSA - uses his CPAP nightly; it works well for him, no daytime sleepiness. Taking Armodafinil - Rx'd by Psychiatry.   Obesity: Walking 30 minutes every day; cooking himself now - trying not to eat out.  Recommend he continue to make healthy lifestyle changes. Weight is stable, no changes.   Past Medical History:  Diagnosis Date  . Anemia    iron deficiency  . Anxiety   . Bipolar 1 disorder, manic, moderate ([place])   . Claudication ([place]) 07/16/2017  . Depression   . Diabetes mellitus without complication ([place])   . GERD (gastroesophageal  reflux disease)   . Hyperlipidemia   . Hypertension   . Insomnia   . Parkinson disease ([place])   . Peptic ulcer disease with hemorrhage   . Sleep apnea     Past Surgical History:  Procedure Laterality Date  . ESOPHAGOGASTRODUODENOSCOPY Left 03/16/2016   Procedure: ESOPHAGOGASTRODUODENOSCOPY (EGD);  Surgeon: Manus Gunning, MD;  Location: [facility];  Service: Gastroenterology;  Laterality: Left;  . FOOT FRACTURE SURGERY Right    Family History  Problem Relation Age of Onset  . Cancer Mother   . Heart disease Father   . Arthritis Brother     Social History   Socioeconomic History  . Marital status: Widowed    Spouse name: Not on file  . Number of children: 0  . Years of education: Not on file  . Highest education level: High school graduate  Occupational History    Employer: DISABLED  Social Needs  . Financial resource strain: Not hard at all  . Food insecurity:    Worry: Never true    Inability: Never true  . Transportation needs:    Medical: No    Non-medical: No  Tobacco Use  . Smoking status: Never Smoker  . Smokeless tobacco: Never Used  . Tobacco comment: Non-smoker  Substance and Sexual Activity  . Alcohol use: No    Alcohol/week: 0.0 standard drinks  . Drug use: No  . Sexual activity: Not Currently    Partners: Female  Lifestyle  . Physical activity:    Days per week: 0 days    Minutes per session: 0 min  . Stress: Only a little  Relationships  . Social connections:    Talks on phone: More than three times a week    Gets together: More than three times a week    Attends religious service: More than 4 times per year    Active member of club or organization: Yes    Attends meetings of clubs or organizations: More than 4 times per year    Relationship status: Widowed  . Intimate partner violence:    Fear of current or ex partner: No    Emotionally abused: No    Physically abused: No    Forced sexual activity: No  Other Topics Concern  .  Not on file  Social History Narrative  . Not on file    Outpatient Medications Prior to Visit  Medication Sig Dispense Refill  . acetaminophen (TYLENOL) 500 MG tablet Take 1,000 mg by mouth every 6 (six) hours as needed (pain).    . Armodafinil 250 MG tablet Take by mouth.    Marland Kitchen aspirin (ASPIRIN 81) 81 MG chewable tablet Chew by mouth.    Marland Kitchen b complex vitamins capsule Take 1 capsule by mouth daily.    . carbidopa-levodopa (SINEMET CR) 50-200 MG per tablet Take 1 tablet by mouth 3 (  three) times daily.     . cholecalciferol (VITAMIN D3) 25 MCG (1000 UT) tablet Take 1,000 Units by mouth daily.    . DULoxetine (CYMBALTA) 60 MG capsule Take 120 mg by mouth at bedtime.    . lamoTRIgine (LAMICTAL) 200 MG tablet Take 200 mg by mouth at bedtime.    . ondansetron (ZOFRAN) 4 MG tablet Take 1 tablet (4 mg total) by mouth every 8 (eight) hours as needed for nausea or vomiting. 20 tablet 0  . propranolol ER (INDERAL LA) 60 MG 24 hr capsule Take 60 mg by mouth at bedtime.     Marland Kitchen QUEtiapine (SEROQUEL XR) 400 MG 24 hr tablet TK 1 T PO QD AT 8PM ONE HOUR BEFORE OR AFTER A MEAL  2  . rOPINIRole (REQUIP) 3 MG tablet Take by mouth.    . Empagliflozin-metFORMIN HCl (SYNJARDY) 12.02-999 MG TABS Take 1 tablet by mouth 2 (two) times daily at 10 AM and 5 PM. 60 tablet 3  . glipiZIDE (GLUCOTROL XL) 2.5 MG 24 hr tablet Take 1 tablet (2.5 mg total) by mouth daily with breakfast. 30 tablet 3  . Insulin Degludec-Liraglutide (XULTOPHY) 100-3.6 UNIT-MG/ML SOPN Inject 46 Units into the skin at bedtime. 15 mL 2  . Insulin Pen Needle (NOVOFINE) 32G X 6 MM MISC 1 each by Does not apply route daily. 100 each 3  . LORazepam (ATIVAN) 0.5 MG tablet Take 1 mg by mouth 3 (three) times daily as needed for anxiety.    Marland Kitchen LYRICA 50 MG capsule tk 1 capsule at bedtime 30 capsule 5  . pantoprazole (PROTONIX) 40 MG tablet Take 1 tablet (40 mg total) by mouth daily. 30 tablet 0  . quinapril-hydrochlorothiazide (ACCURETIC) 20-12.5 MG tablet Take  1 tablet by mouth daily. 30 tablet 3  . fluticasone (FLONASE) 50 MCG/ACT nasal spray Place 2 sprays into both nostrils daily. (Patient not taking: Reported on 09/22/2018) 16 g 2  . LORazepam (ATIVAN) 1 MG tablet TK 1 T PO TID PRN     No facility-administered medications prior to visit.     No Known Allergies  ROS Review of Systems  Constitutional: Negative for chills, fatigue, fever and unexpected weight change.  HENT: Negative.   Respiratory: Negative for cough, shortness of breath and wheezing.   Cardiovascular: Negative for chest pain, palpitations and leg swelling.  Gastrointestinal: Negative for abdominal pain, constipation, diarrhea, nausea and vomiting.  Endocrine: Negative.  Negative for polydipsia, polyphagia and polyuria.  Musculoskeletal: Negative for gait problem and joint swelling.  Skin: Negative for rash.  Neurological: Negative for dizziness, weakness and headaches.  Psychiatric/Behavioral: Negative for dysphoric mood and sleep disturbance. The patient is not nervous/anxious.   All other systems reviewed and are negative.     Objective:    Physical Exam  Constitutional: He is oriented to person, place, and time. He appears well-developed and well-nourished. No distress.  HENT:  Head: Normocephalic and atraumatic.  Right Ear: Tympanic membrane, external ear and ear canal normal.  Left Ear: Tympanic membrane, external ear and ear canal normal.  Nose: No mucosal edema or rhinorrhea. Right sinus exhibits maxillary sinus tenderness and frontal sinus tenderness. Left sinus exhibits maxillary sinus tenderness and frontal sinus tenderness.  Mouth/Throat: Uvula is midline, oropharynx is clear and moist and mucous membranes are normal. No oropharyngeal exudate, posterior oropharyngeal edema, posterior oropharyngeal erythema or tonsillar abscesses.  Eyes: Pupils are equal, round, and reactive to light. Conjunctivae and EOM are normal. No scleral icterus.  Neck: Normal range  of motion and full passive range of motion without pain. Neck supple. Normal range of motion present.  Cardiovascular: Normal rate and regular rhythm. Exam reveals no friction rub.  Murmur heard. Pulmonary/Chest: Effort normal and breath sounds normal. No respiratory distress. He has no wheezes. He has no rales.  Musculoskeletal: Normal range of motion.        General: No deformity or edema.  Lymphadenopathy:    He has cervical adenopathy (Mild submandibular).  Neurological: He is alert and oriented to person, place, and time. No cranial nerve deficit.  Skin: Skin is warm and dry. No rash noted. He is not diaphoretic.  Psychiatric: He has a normal mood and affect. His behavior is normal. Judgment and thought content normal.  Nursing note and vitals reviewed.   Diabetic Foot Exam - Simple   Simple Foot Form Diabetic Foot exam was performed with the following findings:  Yes 11/14/2018  2:10 PM  Visual Inspection No deformities, no ulcerations, no other skin breakdown bilaterally:  Yes Sensation Testing Intact to touch and monofilament testing bilaterally:  Yes Pulse Check Posterior Tibialis and Dorsalis pulse intact bilaterally:  Yes Comments     BP (!) 142/86 (BP Location: Right Arm, Patient Position: Sitting, Cuff Size: Large)   Pulse (!) 115   Temp 98.1 F (36.7 C)   Resp 16   Ht 5\' 11"  (1.803 m)   Wt 234 lb 6.4 oz (106.3 kg)   SpO2 98%   BMI 32.69 kg/m  Wt Readings from Last 3 Encounters:  11/14/18 234 lb 6.4 oz (106.3 kg)  09/22/18 235 lb 6.4 oz (106.8 kg)  07/14/18 235 lb 3.2 oz (106.7 kg)    Health Maintenance Due  Topic Date Due  . OPHTHALMOLOGY EXAM  05/06/2018    There are no preventive care reminders to display for this patient.  Lab Results  Component Value Date   TSH 1.41 05/18/2016   Lab Results  Component Value Date   WBC 12.8 (H) 03/30/2017   HGB 13.4 03/30/2017   HCT 38.9 (L) 03/30/2017   MCV 87.0 03/30/2017   PLT 246 03/30/2017   Lab  Results  Component Value Date   NA 134 (L) 07/14/2018   K 4.9 07/14/2018   CO2 28 07/14/2018   GLUCOSE 244 (H) 07/14/2018   BUN 23 07/14/2018   CREATININE 0.88 07/14/2018   BILITOT 0.3 07/14/2018   ALKPHOS 80 03/30/2017   AST 13 07/14/2018   ALT 15 07/14/2018   PROT 7.0 07/14/2018   ALBUMIN 3.9 03/30/2017   CALCIUM 9.8 07/14/2018   ANIONGAP 7 03/30/2017   Lab Results  Component Value Date   CHOL 152 07/14/2018   Lab Results  Component Value Date   HDL 32 (L) 07/14/2018   Lab Results  Component Value Date   LDLCALC 83 07/14/2018   Lab Results  Component Value Date   TRIG 305 (H) 07/14/2018   Lab Results  Component Value Date   CHOLHDL 4.8 07/14/2018   Lab Results  Component Value Date   HGBA1C 10.5 (A) 11/14/2018      Assessment & Plan:   Problem List Items Addressed This Visit      Cardiovascular and Mediastinum   Hypertension    Maintain current regimen. DASH diet discussed.      Relevant Medications   quinapril-hydrochlorothiazide (ACCURETIC) 20-12.5 MG tablet   atorvastatin (LIPITOR) 20 MG tablet     Respiratory   Obstructive sleep apnea    Continue CPAP  Digestive   GERD (gastroesophageal reflux disease)    Protonix is refilled, needs to return to GI - he will call for appointment.      Relevant Orders   Ambulatory referral to Gastroenterology     Endocrine   Diabetes mellitus type 2 in obese ([place]) - Primary    A1C drastically elevated today.  He does admit to failing to comply with diabetic diet.  I recommend endocrinology referral as he has been difficult to control in the past.  He refuses this today and prefers to work on lifestyle modification and medication adherence for the next 3 months. If A1C remains uncontrolled at next visit, we will refer.      Relevant Medications   quinapril-hydrochlorothiazide (ACCURETIC) 20-12.5 MG tablet   atorvastatin (LIPITOR) 20 MG tablet   Empagliflozin-metFORMIN HCl (SYNJARDY) 12.02-999 MG  TABS   Insulin Degludec-Liraglutide (XULTOPHY) 100-3.6 UNIT-MG/ML SOPN   Insulin Pen Needle (NOVOFINE) 32G X 6 MM MISC   glucose blood test strip   glipiZIDE (GLUCOTROL XL) 5 MG 24 hr tablet   Other Relevant Orders   POCT glycosylated hemoglobin (Hb A1C) (Completed)   POCT glucose (manual entry) (Completed)     Nervous and Auditory   Parkinson disease ([employer] [facility])    Continue follow up with neurology      Relevant Medications   LYRICA 50 MG capsule     Other   HLD (hyperlipidemia)    He had stopped simvastatin on his own for unknown reason.  Advised to restart statin therapy - we will use atorvastatin.      Relevant Medications   quinapril-hydrochlorothiazide (ACCURETIC) 20-12.5 MG tablet   atorvastatin (LIPITOR) 20 MG tablet   Anxiety    Continue follow up with psychiatry.      Relevant Medications   LORazepam (ATIVAN) 1 MG tablet   Fibromyalgia    Doing well on Lyrica.       Relevant Medications   LYRICA 50 MG capsule   History of GI bleed    Needs to return to see GI.      Relevant Orders   Ambulatory referral to Gastroenterology   History of iron deficiency anemia    Needs to return to see GI      Relevant Orders   Ambulatory referral to Gastroenterology   Vitamin D deficiency   Bipolar 1 disorder, manic, moderate ([place])    Continue follow up with psychiatry      Major depression, recurrent ([place])    Continue follow up with Psychiatry      Relevant Medications   LORazepam (ATIVAN) 1 MG tablet    Other Visit Diagnoses    Diabetes mellitus with coincident hypertension ([place])       Relevant Medications   quinapril-hydrochlorothiazide (ACCURETIC) 20-12.5 MG tablet   atorvastatin (LIPITOR) 20 MG tablet   Empagliflozin-metFORMIN HCl (SYNJARDY) 12.02-999 MG TABS   Insulin Degludec-Liraglutide (XULTOPHY) 100-3.6 UNIT-MG/ML SOPN   Insulin Pen Needle (NOVOFINE) 32G X 6 MM MISC   glucose blood test strip   glipiZIDE (GLUCOTROL XL) 5 MG 24 hr tablet   Gastritis  and gastroduodenitis       Relevant Orders   Ambulatory referral to Gastroenterology   Acute non-recurrent pansinusitis       Relevant Medications   amoxicillin-clavulanate (AUGMENTIN) 875-125 MG tablet      Meds ordered this encounter  Medications  . LYRICA 50 MG capsule    Sig: take 1 capsule at bedtime    Dispense:  30 capsule  Refill:  5    Order Specific Question:   Supervising Provider    Answer:   Steele Sizer [[id number]]  . quinapril-hydrochlorothiazide (ACCURETIC) 20-12.5 MG tablet    Sig: Take 1 tablet by mouth daily.    Dispense:  30 tablet    Refill:  3    Order Specific Question:   Supervising Provider    Answer:   Steele Sizer [[id number]]  . DISCONTD: pantoprazole (PROTONIX) 40 MG tablet    Sig: Take 1 tablet (40 mg total) by mouth daily.    Dispense:  30 tablet    Refill:  1    Order Specific Question:   Supervising Provider    Answer:   Steele Sizer [[id number]]  . atorvastatin (LIPITOR) 20 MG tablet    Sig: Take 1 tablet (20 mg total) by mouth daily.    Dispense:  90 tablet    Refill:  3    Order Specific Question:   Supervising Provider    Answer:   Steele Sizer [[id number]]  . Empagliflozin-metFORMIN HCl (SYNJARDY) 12.02-999 MG TABS    Sig: Take 1 tablet by mouth 2 (two) times daily at 10 AM and 5 PM.    Dispense:  60 tablet    Refill:  3    Order Specific Question:   Supervising Provider    Answer:   Steele Sizer [[id number]]  . Insulin Degludec-Liraglutide (XULTOPHY) 100-3.6 UNIT-MG/ML SOPN    Sig: Inject 46 Units into the skin at bedtime.    Dispense:  15 mL    Refill:  2    Order Specific Question:   Supervising Provider    Answer:   Steele Sizer [[id number]]  . Insulin Pen Needle (NOVOFINE) 32G X 6 MM MISC    Sig: 1 each by Does not apply route daily.    Dispense:  100 each    Refill:  3    Order Specific Question:   Supervising Provider    Answer:   Steele Sizer [[id number]]  . glucose blood test strip    Sig: Check once daily fasting, and up to 3 times a  day as needed.    Dispense:  100 each    Refill:  12    Please use brand discretion based on meter type/affordability.    Order Specific Question:   Supervising Provider    Answer:   Steele Sizer [[id number]]  . glipiZIDE (GLUCOTROL XL) 5 MG 24 hr tablet    Sig: Take 1 tablet (5 mg total) by mouth daily with breakfast.    Dispense:  30 tablet    Refill:  5    Order Specific Question:   Supervising Provider    Answer:   Steele Sizer [[id number]]  . amoxicillin-clavulanate (AUGMENTIN) 875-125 MG tablet    Sig: Take 1 tablet by mouth 2 (two) times daily for 10 days.    Dispense:  20 tablet    Refill:  0    Order Specific Question:   Supervising Provider    Answer:   Steele Sizer [[id number]]    Follow-up: Return in about 3 months (around 02/12/2019) for follow up.    Hubbard Hartshorn, FNP

## 2018-11-14 NOTE — Patient Instructions (Addendum)
Write down your fasting blood sugars each day for 2 weeks.  Call our office if >200 after 2 weeks of new dose of glipizide.  Diabetes Mellitus and Nutrition, Adult When you have diabetes (diabetes mellitus), it is very important to have healthy eating habits because your blood sugar (glucose) levels are greatly affected by what you eat and drink. Eating healthy foods in the appropriate amounts, at about the same times every day, can help you:  Control your blood glucose.  Lower your risk of heart disease.  Improve your blood pressure.  Reach or maintain a healthy weight. Every person with diabetes is different, and each person has different needs for a meal plan. Your health care provider may recommend that you work with a diet and nutrition specialist (dietitian) to make a meal plan that is best for you. Your meal plan may vary depending on factors such as:  The calories you need.  The medicines you take.  Your weight.  Your blood glucose, blood pressure, and cholesterol levels.  Your activity level.  Other health conditions you have, such as heart or kidney disease. How do carbohydrates affect me? Carbohydrates, also called carbs, affect your blood glucose level more than any other type of food. Eating carbs naturally raises the amount of glucose in your blood. Carb counting is a method for keeping track of how many carbs you eat. Counting carbs is important to keep your blood glucose at a healthy level, especially if you use insulin or take certain oral diabetes medicines. It is important to know how many carbs you can safely have in each meal. This is different for every person. Your dietitian can help you calculate how many carbs you should have at each meal and for each snack. Foods that contain carbs include:  Bread, cereal, rice, pasta, and crackers.  Potatoes and corn.  Peas, beans, and lentils.  Milk and yogurt.  Fruit and juice.  Desserts, such as cakes, cookies,  ice cream, and candy. How does alcohol affect me? Alcohol can cause a sudden decrease in blood glucose (hypoglycemia), especially if you use insulin or take certain oral diabetes medicines. Hypoglycemia can be a life-threatening condition. Symptoms of hypoglycemia (sleepiness, dizziness, and confusion) are similar to symptoms of having too much alcohol. If your health care provider says that alcohol is safe for you, follow these guidelines:  Limit alcohol intake to no more than 1 drink per day for nonpregnant women and 2 drinks per day for men. One drink equals 12 oz of beer, 5 oz of wine, or 1 oz of hard liquor.  Do not drink on an empty stomach.  Keep yourself hydrated with water, diet soda, or unsweetened iced tea.  Keep in mind that regular soda, juice, and other mixers may contain a lot of sugar and must be counted as carbs. What are tips for following this plan?  Reading food labels  Start by checking the serving size on the "Nutrition Facts" label of packaged foods and drinks. The amount of calories, carbs, fats, and other nutrients listed on the label is based on one serving of the item. Many items contain more than one serving per package.  Check the total grams (g) of carbs in one serving. You can calculate the number of servings of carbs in one serving by dividing the total carbs by 15. For example, if a food has 30 g of total carbs, it would be equal to 2 servings of carbs.  Check the number of  grams (g) of saturated and trans fats in one serving. Choose foods that have low or no amount of these fats.  Check the number of milligrams (mg) of salt (sodium) in one serving. Most people should limit total sodium intake to less than 2,300 mg per day.  Always check the nutrition information of foods labeled as "low-fat" or "nonfat". These foods may be higher in added sugar or refined carbs and should be avoided.  Talk to your dietitian to identify your daily goals for nutrients listed  on the label. Shopping  Avoid buying canned, premade, or processed foods. These foods tend to be high in fat, sodium, and added sugar.  Shop around the outside edge of the grocery store. This includes fresh fruits and vegetables, bulk grains, fresh meats, and fresh dairy. Cooking  Use low-heat cooking methods, such as baking, instead of high-heat cooking methods like deep frying.  Cook using healthy oils, such as olive, canola, or sunflower oil.  Avoid cooking with butter, cream, or high-fat meats. Meal planning  Eat meals and snacks regularly, preferably at the same times every day. Avoid going long periods of time without eating.  Eat foods high in fiber, such as fresh fruits, vegetables, beans, and whole grains. Talk to your dietitian about how many servings of carbs you can eat at each meal.  Eat 4-6 ounces (oz) of lean protein each day, such as lean meat, chicken, fish, eggs, or tofu. One oz of lean protein is equal to: ? 1 oz of meat, chicken, or fish. ? 1 egg. ?  cup of tofu.  Eat some foods each day that contain healthy fats, such as avocado, nuts, seeds, and fish. Lifestyle  Check your blood glucose regularly.  Exercise regularly as told by your health care provider. This may include: ? 150 minutes of moderate-intensity or vigorous-intensity exercise each week. This could be brisk walking, biking, or water aerobics. ? Stretching and doing strength exercises, such as yoga or weightlifting, at least 2 times a week.  Take medicines as told by your health care provider.  Do not use any products that contain nicotine or tobacco, such as cigarettes and e-cigarettes. If you need help quitting, ask your health care provider.  Work with a Social worker or diabetes educator to identify strategies to manage stress and any emotional and social challenges. Questions to ask a health care provider  Do I need to meet with a diabetes educator?  Do I need to meet with a  dietitian?  What number can I call if I have questions?  When are the best times to check my blood glucose? Where to find more information:  American Diabetes Association: diabetes.org  Academy of Nutrition and Dietetics: www.eatright.[employer] of Diabetes and Digestive and Kidney Diseases (NIH): [employer] Summary  A healthy meal plan will help you control your blood glucose and maintain a healthy lifestyle.  Working with a diet and nutrition specialist (dietitian) can help you make a meal plan that is best for you.  Keep in mind that carbohydrates (carbs) and alcohol have immediate effects on your blood glucose levels. It is important to count carbs and to use alcohol carefully. This information is not intended to replace advice given to you by your health care provider. Make sure you discuss any questions you have with your health care provider. Document Released: 06/18/2005 Document Revised: 04/21/2017 Document Reviewed: 10/26/2016 Elsevier Interactive Patient Education  2019 [employer].

## 2018-11-16 ENCOUNTER — Encounter: Payer: Self-pay | Admitting: Family Medicine

## 2018-11-16 NOTE — Assessment & Plan Note (Signed)
Continue follow-up with psychiatry 

## 2018-11-16 NOTE — Assessment & Plan Note (Signed)
Needs to return to see GI

## 2018-11-16 NOTE — Assessment & Plan Note (Signed)
He had stopped simvastatin on his own for unknown reason.  Advised to restart statin therapy - we will use atorvastatin.

## 2018-11-16 NOTE — Assessment & Plan Note (Signed)
Maintain current regimen. DASH diet discussed.

## 2018-11-16 NOTE — Assessment & Plan Note (Signed)
Continue follow up with Psychiatry

## 2018-11-16 NOTE — Assessment & Plan Note (Signed)
Doing well on Lyrica.

## 2018-11-16 NOTE — Assessment & Plan Note (Signed)
Continue follow-up with neurology. 

## 2018-11-16 NOTE — Assessment & Plan Note (Signed)
Needs to return to see GI.

## 2018-11-16 NOTE — Assessment & Plan Note (Signed)
A1C drastically elevated today.  He does admit to failing to comply with diabetic diet.  I recommend endocrinology referral as he has been difficult to control in the past.  He refuses this today and prefers to work on lifestyle modification and medication adherence for the next 3 months. If A1C remains uncontrolled at next visit, we will refer.

## 2018-11-16 NOTE — Assessment & Plan Note (Signed)
Protonix is refilled, needs to return to GI - he will call for appointment.

## 2018-11-16 NOTE — Assessment & Plan Note (Signed)
Continue CPAP.  

## 2018-11-18 ENCOUNTER — Encounter: Payer: Self-pay | Admitting: *Deleted

## 2018-11-25 ENCOUNTER — Other Ambulatory Visit: Payer: Self-pay

## 2018-11-25 NOTE — Patient Outreach (Signed)
[facility]) Care Management  11/25/2018  BLAND RUDZINSKI Apr 09, 1961 [id number]   Medication Adherence call to Mr. Shizuo Biskup patient did not answer pharmacy said patient has pick up Glipizide ER 2.5 mg on 11/14/18 for a 90 days supply on Metformin they do not showed he received this prescription through them patient is a [employer].   [place] Management Direct Dial [phone]  Fax [phone] [email or web address]

## 2019-01-17 ENCOUNTER — Other Ambulatory Visit: Payer: Self-pay | Admitting: Family Medicine

## 2019-01-17 DIAGNOSIS — E119 Type 2 diabetes mellitus without complications: Secondary | ICD-10-CM

## 2019-01-17 DIAGNOSIS — I10 Essential (primary) hypertension: Principal | ICD-10-CM

## 2019-02-13 ENCOUNTER — Ambulatory Visit: Payer: PPO | Admitting: Family Medicine

## 2019-02-16 ENCOUNTER — Ambulatory Visit (INDEPENDENT_AMBULATORY_CARE_PROVIDER_SITE_OTHER): Payer: PPO | Admitting: Family Medicine

## 2019-02-16 ENCOUNTER — Encounter: Payer: Self-pay | Admitting: Family Medicine

## 2019-02-16 ENCOUNTER — Other Ambulatory Visit: Payer: Self-pay

## 2019-02-16 VITALS — BP 132/84 | HR 85 | Temp 97.6°F | Resp 16 | Ht 71.0 in | Wt 221.2 lb

## 2019-02-16 DIAGNOSIS — G20 Parkinson's disease: Secondary | ICD-10-CM | POA: Diagnosis not present

## 2019-02-16 DIAGNOSIS — E7800 Pure hypercholesterolemia, unspecified: Secondary | ICD-10-CM | POA: Diagnosis not present

## 2019-02-16 DIAGNOSIS — E1169 Type 2 diabetes mellitus with other specified complication: Secondary | ICD-10-CM

## 2019-02-16 DIAGNOSIS — F3112 Bipolar disorder, current episode manic without psychotic features, moderate: Secondary | ICD-10-CM | POA: Diagnosis not present

## 2019-02-16 DIAGNOSIS — I10 Essential (primary) hypertension: Secondary | ICD-10-CM

## 2019-02-16 DIAGNOSIS — F4321 Adjustment disorder with depressed mood: Secondary | ICD-10-CM

## 2019-02-16 DIAGNOSIS — F419 Anxiety disorder, unspecified: Secondary | ICD-10-CM | POA: Diagnosis not present

## 2019-02-16 DIAGNOSIS — K219 Gastro-esophageal reflux disease without esophagitis: Secondary | ICD-10-CM | POA: Diagnosis not present

## 2019-02-16 DIAGNOSIS — E559 Vitamin D deficiency, unspecified: Secondary | ICD-10-CM | POA: Diagnosis not present

## 2019-02-16 DIAGNOSIS — E669 Obesity, unspecified: Secondary | ICD-10-CM

## 2019-02-16 DIAGNOSIS — G4733 Obstructive sleep apnea (adult) (pediatric): Secondary | ICD-10-CM | POA: Diagnosis not present

## 2019-02-16 DIAGNOSIS — M797 Fibromyalgia: Secondary | ICD-10-CM

## 2019-02-16 DIAGNOSIS — Z6835 Body mass index (BMI) 35.0-35.9, adult: Secondary | ICD-10-CM | POA: Insufficient documentation

## 2019-02-16 DIAGNOSIS — R5383 Other fatigue: Secondary | ICD-10-CM

## 2019-02-16 NOTE — Progress Notes (Signed)
Name: Bryan Flowers   MRN: [id number]    DOB: 11/26/1960   Date:02/16/2019       Progress Note  Subjective  Chief Complaint  Chief Complaint  Patient presents with  . Follow-up    3 month  . Diabetes  . Hypertension  . Hyperlipidemia    HPI  Grief:His wife passed away since last visit.He is staying connected with friends, [name], and family. Says he is not staying isolated - calling people, walking. Moved out of assisted living and into an apartment on [address]. He has been doing well taking care of his medicines; starting to cook for himself again.  He is working on revoking his POA now. Taking a grief class at church.   Bipolar disorder: under the care of Dr. [name] at [facility]. He states his last episode was of depression but feeling well at this time, has been feeling very level. No suicidal thoughts or ideation. Next appt is next week. He also sees a Social worker at [facility]. Stable and unchanged at this time.  Diabetes type II: he has been on Xultophy(50 units),Synjardy (12.5-1000mg )and Glipizide5mg ,last  A1C was 6.3% 78mos ago, 78mos ago it was 10.5%, will check again today. Lowest -Highest Range - 140-200 fasting. Denies polydipsia, polyuria, or polyphagia.  He denied side effects from DM medication. Uriune micro is UTD, taking ACEI; taking atorvastatin. Needs Eye examination - was canceled due to COVID-19. He is down 13lbs  Parkinson's disease: sees Dr. [name], taking medication, tremors are under control and gait is back to normal.  Has not fallen in well over a year. Still under the care of neurologist and doing well. Taking requip for restless leg and on sinemet for Parkinson's.  FMS: He is taking lyrica 50mg  QHS and this seems to be much more effective than gabapentin that he was on before.  Pain is well controlled; fatigue is still a daily struggle.   HTN: bp towards upper end of normal today - adjusteddose of  quinapril hctz from twice daily to once a day; still taking propanolol. No chest pain or palpitation, no shortness of breath, lightheadedness or orthostatic dizziness, occassional mild dependent BLE edema.  GERD: Has history of bleeding ulcers. He had follow up last year with GI, follows up once a year. No regurgitation or vomiting, blood in stool, or dark and tarry stools, abdominal pain. Taking pepcid PRN.  Vitamin D Deficiency- Low at last visit - taking supplementation now - 1000iu daily  HLD - Restarted atorvastatin at last visit, will recheck lipdis today.  Last lipids were not at goal. .Denies chest pain, palpitations, no myalgias.   OSA - uses his CPAP nightly; it works well for him, no daytime sleepiness. Taking Armodafinil - Rx'd by Psychiatry.  Is having some daytime fatigue - will check TSH today as well.  Obesity: Walking 30 minutes every day; cooking himself now - trying not to eat out. Recommend he continue to make healthy lifestyle changes. Weight is stable, no changes.   Patient Active Problem List   Diagnosis Date Noted  . Morbid obesity ([place]) 02/16/2019  . Pure hypercholesterolemia 02/16/2019  . Grief 02/16/2019  . Major depression, recurrent ([place]) 03/14/2018  . Bipolar 1 disorder, manic, moderate ([place])   . Vitamin D deficiency 09/10/2017  . Gastroesophageal reflux disease without esophagitis 09/10/2017  . History of iron deficiency anemia 03/30/2016  . History of iron deficiency anemia 03/24/2016  . History of GI bleed 03/15/2016  . Hyponatremia  03/15/2016  . Parkinson disease ([place])   . Hypertension   . Anxiety 08/19/2015  . Abnormal ECG 04/09/2015  . Back ache 04/09/2015  . Diabetes mellitus type 2 in obese ([place]) 04/09/2015  . Obstructive sleep apnea 04/09/2015  . HDL deficiency 04/09/2015  . Hearing loss, sensorineural, combined types 04/09/2015  . HLD (hyperlipidemia) 04/09/2015  . Vocal cord atrophy 05/29/2014  . Fibromyalgia 03/15/2014  .  Testicular hypofunction 05/17/2009  . Migraine with aura 05/14/2009    Past Surgical History:  Procedure Laterality Date  . ESOPHAGOGASTRODUODENOSCOPY Left 03/16/2016   Procedure: ESOPHAGOGASTRODUODENOSCOPY (EGD);  Surgeon: Manus Gunning, MD;  Location: [facility];  Service: Gastroenterology;  Laterality: Left;  . FOOT FRACTURE SURGERY Right     Family History  Problem Relation Age of Onset  . Cancer Mother   . Heart disease Father   . Arthritis Brother     Social History   Socioeconomic History  . Marital status: Widowed    Spouse name: Not on file  . Number of children: 0  . Years of education: Not on file  . Highest education level: High school graduate  Occupational History    Employer: DISABLED  Social Needs  . Financial resource strain: Not hard at all  . Food insecurity:    Worry: Never true    Inability: Never true  . Transportation needs:    Medical: No    Non-medical: No  Tobacco Use  . Smoking status: Never Smoker  . Smokeless tobacco: Never Used  . Tobacco comment: Non-smoker  Substance and Sexual Activity  . Alcohol use: No    Alcohol/week: 0.0 standard drinks  . Drug use: No  . Sexual activity: Not Currently    Partners: Female  Lifestyle  . Physical activity:    Days per week: 0 days    Minutes per session: 0 min  . Stress: Only a little  Relationships  . Social connections:    Talks on phone: More than three times a week    Gets together: More than three times a week    Attends religious service: More than 4 times per year    Active member of club or organization: Yes    Attends meetings of clubs or organizations: More than 4 times per year    Relationship status: Widowed  . Intimate partner violence:    Fear of current or ex partner: No    Emotionally abused: No    Physically abused: No    Forced sexual activity: No  Other Topics Concern  . Not on file  Social History Narrative  . Not on file     Current Outpatient  Medications:  .  acetaminophen (TYLENOL) 500 MG tablet, Take 1,000 mg by mouth every 6 (six) hours as needed (pain)., Disp: , Rfl:  .  Armodafinil 250 MG tablet, Take by mouth., Disp: , Rfl:  .  aspirin (ASPIRIN 81) 81 MG chewable tablet, Chew by mouth., Disp: , Rfl:  .  atorvastatin (LIPITOR) 20 MG tablet, Take 1 tablet (20 mg total) by mouth daily., Disp: 90 tablet, Rfl: 3 .  b complex vitamins capsule, Take 1 capsule by mouth daily., Disp: , Rfl:  .  carbidopa-levodopa (SINEMET CR) 50-200 MG per tablet, Take 1 tablet by mouth 3 (three) times daily. , Disp: , Rfl:  .  cholecalciferol (VITAMIN D3) 25 MCG (1000 UT) tablet, Take 1,000 Units by mouth daily., Disp: , Rfl:  .  DULoxetine (CYMBALTA) 60 MG capsule, Take 120  mg by mouth at bedtime., Disp: , Rfl:  .  Empagliflozin-metFORMIN HCl (SYNJARDY) 12.02-999 MG TABS, Take 1 tablet by mouth 2 (two) times daily at 10 AM and 5 PM., Disp: 60 tablet, Rfl: 3 .  glipiZIDE (GLUCOTROL XL) 5 MG 24 hr tablet, Take 1 tablet (5 mg total) by mouth daily with breakfast., Disp: 30 tablet, Rfl: 5 .  glucose blood test strip, Check once daily fasting, and up to 3 times a day as needed., Disp: 100 each, Rfl: 12 .  Insulin Pen Needle (NOVOFINE) 32G X 6 MM MISC, 1 each by Does not apply route daily., Disp: 100 each, Rfl: 3 .  lamoTRIgine (LAMICTAL) 200 MG tablet, Take 200 mg by mouth at bedtime., Disp: , Rfl:  .  LORazepam (ATIVAN) 1 MG tablet, TK 1 T PO TID PRN, Disp: , Rfl:  .  LYRICA 50 MG capsule, take 1 capsule at bedtime, Disp: 30 capsule, Rfl: 5 .  ondansetron (ZOFRAN) 4 MG tablet, Take 1 tablet (4 mg total) by mouth every 8 (eight) hours as needed for nausea or vomiting., Disp: 20 tablet, Rfl: 0 .  pantoprazole (PROTONIX) 40 MG tablet, TAKE 1 TABLET(40 MG) BY MOUTH DAILY, Disp: 90 tablet, Rfl: 0 .  propranolol ER (INDERAL LA) 60 MG 24 hr capsule, Take 60 mg by mouth at bedtime. , Disp: , Rfl:  .  QUEtiapine (SEROQUEL XR) 400 MG 24 hr tablet, TK 1 T PO QD AT 8PM  ONE HOUR BEFORE OR AFTER A MEAL, Disp: , Rfl: 2 .  quinapril-hydrochlorothiazide (ACCURETIC) 20-12.5 MG tablet, Take 1 tablet by mouth daily., Disp: 30 tablet, Rfl: 3 .  rOPINIRole (REQUIP) 3 MG tablet, Take by mouth., Disp: , Rfl:  .  XULTOPHY 100-3.6 UNIT-MG/ML SOPN, INJECT 46 UNITS UNDER THE SKIN AT BEDTIME, Disp: 15 mL, Rfl: 2 .  fluticasone (FLONASE) 50 MCG/ACT nasal spray, Place 2 sprays into both nostrils daily. (Patient not taking: Reported on 09/22/2018), Disp: 16 g, Rfl: 2  No Known Allergies  I personally reviewed active problem list, medication list, allergies, notes from last encounter, lab results with the patient/caregiver today.   ROS Constitutional: Negative for fever or weight change. +Fatigue  Respiratory: Negative for cough and shortness of breath.   Cardiovascular: Negative for chest pain or palpitations.  Gastrointestinal: Negative for abdominal pain, no bowel changes.  Musculoskeletal: Negative for gait problem or joint swelling.  Skin: Negative for rash.  Neurological: Negative for dizziness or headache.  No other specific complaints in a complete review of systems (except as listed in HPI above).  Objective  Vitals:   02/16/19 1237  BP: 132/84  Pulse: 85  Resp: 16  Temp: 97.6 F (36.4 C)  TempSrc: Oral  SpO2: 98%  Weight: 221 lb 3.2 oz (100.3 kg)  Height: 5\' 11"  (1.803 m)   Body mass index is 30.85 kg/m.  Physical Exam  Constitutional: Patient appears well-developed and well-nourished. No distress.  HENT: Head: Normocephalic and atraumatic. Eyes: Conjunctivae and EOM are normal. No scleral icterus. Neck: Normal range of motion. Neck supple. No JVD present. No thyromegaly present.  Cardiovascular: Normal rate, regular rhythm and normal heart sounds.  No murmur heard. No BLE edema. Pulmonary/Chest: Effort normal and breath sounds normal. No respiratory distress. Musculoskeletal: Normal range of motion, no joint effusions. No gross deformities  Neurological: Pt is alert and oriented to person, place, and time. No cranial nerve deficit. Coordination, balance, strength, speech and gait are normal.  Skin: Skin is warm and dry.  No rash noted. No erythema.  Psychiatric: Patient has a normal mood and affect. behavior is normal. Judgment and thought content normal.  No results found for this or any previous visit (from the past 72 hour(s)).  PHQ2/9: Depression screen [facility] 2/9 02/16/2019 11/14/2018 09/22/2018 07/14/2018 03/14/2018  Decreased Interest 0 1 0 1 0  Down, Depressed, Hopeless 0 1 2 1  0  PHQ - 2 Score 0 2 2 2  0  Altered sleeping 1 3 2 3 2   Tired, decreased energy 0 1 0 1 1  Change in appetite 0 0 0 0 0  Feeling bad or failure about yourself  0 1 0 0 0  Trouble concentrating 0 0 0 0 0  Moving slowly or fidgety/restless 0 0 0 0 0  Suicidal thoughts 0 0 0 0 0  PHQ-9 Score 1 7 4 6 3   Difficult doing work/chores Not difficult at all Somewhat difficult - Somewhat difficult Somewhat difficult  Some recent data might be hidden   PHQ-2/9 Result is negative.    Fall Risk: Fall Risk  02/16/2019 11/14/2018 09/22/2018 07/14/2018 03/14/2018  Falls in the past year? 0 1 1 No No  Number falls in past yr: 0 - 1 - -  Comment - - 1 fall onto grass while helping to move a couch with his brother  - -  Injury with Fall? 0 0 0 - -  Risk for fall due to : - History of fall(s) - - -  Follow up Falls evaluation completed Falls evaluation completed Falls evaluation completed;Falls prevention discussed - -   Assessment & Plan  1. Grief - Continue grief class  2. Diabetes mellitus type 2 in obese ([facility]) - Diabetic diet discussed, medications as prescribed. - COMPLETE METABOLIC PANEL WITH GFR - Hemoglobin A1c  3. Bipolar 1 disorder, manic, moderate ([facility]) - Continue with psychiatry.  4. Anxiety - Continue with psychiatry.  5. Parkinson disease ([place]) - Continue with neurology.  6. Fibromyalgia - Continue with neurology and psychiatry.    7. Essential hypertension - DASH diet discussed. - COMPLETE METABOLIC PANEL WITH GFR  8. Gastroesophageal reflux disease without esophagitis - Avoid triggers, pepcid PRN  9. Vitamin D deficiency - Taking supplment daily  10. Pure hypercholesterolemia - Statin therapy - Lipid panel  11. Obstructive sleep apnea - Wearing CPAP, armodafonil  12. Morbid obesity ([place]) - Discussed importance of 150 minutes of physical activity weekly, eat two servings of fish weekly, eat one serving of tree nuts ( cashews, pistachios, pecans, almonds.Marland Kitchen) every other day, eat 6 servings of fruit/vegetables daily and drink plenty of water and avoid sweet beverages.   13. Fatigue, unspecified type - COMPLETE METABOLIC PANEL WITH GFR - Hemoglobin A1c - TSH

## 2019-02-17 ENCOUNTER — Other Ambulatory Visit: Payer: Self-pay | Admitting: Family Medicine

## 2019-02-17 DIAGNOSIS — E782 Mixed hyperlipidemia: Secondary | ICD-10-CM

## 2019-02-17 DIAGNOSIS — E1169 Type 2 diabetes mellitus with other specified complication: Secondary | ICD-10-CM

## 2019-02-17 LAB — COMPLETE METABOLIC PANEL WITH GFR
AG Ratio: 1.9 (calc) (ref 1.0–2.5)
ALT: 13 U/L (ref 9–46)
AST: 12 U/L (ref 10–35)
Albumin: 4.9 g/dL (ref 3.6–5.1)
Alkaline phosphatase (APISO): 69 U/L (ref 35–144)
BUN: 19 mg/dL (ref 7–25)
CO2: 23 mmol/L (ref 20–32)
Calcium: 9.8 mg/dL (ref 8.6–10.3)
Chloride: 98 mmol/L (ref 98–110)
Creat: 0.99 mg/dL (ref 0.70–1.33)
GFR, Est African American: 98 mL/min/{1.73_m2} (ref >=60)
GFR, Est Non African American: 84 mL/min/{1.73_m2} (ref >=60)
Globulin: 2.6 g/dL (calc) (ref 1.9–3.7)
Glucose, Bld: 237 mg/dL — ABNORMAL HIGH (ref 65–99)
Potassium: 5.4 mmol/L — ABNORMAL HIGH (ref 3.5–5.3)
Sodium: 133 mmol/L — ABNORMAL LOW (ref 135–146)
Total Bilirubin: 0.5 mg/dL (ref 0.2–1.2)
Total Protein: 7.5 g/dL (ref 6.1–8.1)

## 2019-02-17 LAB — LIPID PANEL
Cholesterol: 318 mg/dL — ABNORMAL HIGH (ref <200)
HDL: 39 mg/dL — ABNORMAL LOW (ref >=40)
Non-HDL Cholesterol (Calc): 279 mg/dL (calc) — ABNORMAL HIGH (ref <130)
Total CHOL/HDL Ratio: 8.2 (calc) — ABNORMAL HIGH (ref <5.0)
Triglycerides: 449 mg/dL — ABNORMAL HIGH (ref <150)

## 2019-02-17 LAB — TSH: TSH: 1.27 mIU/L (ref 0.40–4.50)

## 2019-02-17 LAB — HEMOGLOBIN A1C
Hgb A1c MFr Bld: 10.3 % of total Hgb — ABNORMAL HIGH (ref <5.7)
Mean Plasma Glucose: 249 (calc)
eAG (mmol/L): 13.8 (calc)

## 2019-02-22 DIAGNOSIS — G4726 Circadian rhythm sleep disorder, shift work type: Secondary | ICD-10-CM | POA: Diagnosis not present

## 2019-02-22 DIAGNOSIS — G2581 Restless legs syndrome: Secondary | ICD-10-CM | POA: Diagnosis not present

## 2019-02-22 DIAGNOSIS — F3181 Bipolar II disorder: Secondary | ICD-10-CM | POA: Diagnosis not present

## 2019-03-21 ENCOUNTER — Ambulatory Visit: Payer: Self-pay

## 2019-03-21 NOTE — Chronic Care Management (AMB) (Signed)
   Chronic Care Management   Unsuccessful Call Note 03/21/2019 Name: CAYETANO MIKITA MRN: [id number] DOB: 23-Nov-1960  Otelia Limes. Costabile is a 58 year old male who sees Raelyn Ensign, FNP for primary care. Ms. Uvaldo Rising asked the CCM team to consult the patient for CCM services 07/2018  Referral was placed and patient was engaged with CCM Team for DM support. Patient met goals and encouraged to contact CCM Team if any additional needs arise. Upon review of EMR, patient's recent labs indicate his DM is still uncontrolled and as well as his cholesterol. RN CM attempted follow up with Mr. Buckner to discuss barriers to health management.   Was unable to reach patient via telephone and unable to leave a VM message as mailbox has not been set up.  Plan: Will follow-up within 7 business days via telephone.      Mico Spark E. Rollene Rotunda, RN, BSN Nurse Care Coordinator [facility] Care Management  [phone]

## 2019-03-28 ENCOUNTER — Ambulatory Visit: Payer: Self-pay | Admitting: Pharmacist

## 2019-03-28 DIAGNOSIS — E1169 Type 2 diabetes mellitus with other specified complication: Secondary | ICD-10-CM

## 2019-03-28 DIAGNOSIS — E782 Mixed hyperlipidemia: Secondary | ICD-10-CM

## 2019-03-28 DIAGNOSIS — F419 Anxiety disorder, unspecified: Secondary | ICD-10-CM

## 2019-03-28 NOTE — Chronic Care Management (AMB) (Signed)
  Chronic Care Management   Note  03/28/2019 Name: Bryan Flowers: [id number] DOB: [birth year]  58 y.o. year old male who sees Raelyn Ensign, FNP for primary care. Patient consented to CCM services 07/2018- referral placed at that time for DM support. Patient met goals and was encouraged to contact CCM team for any additional needs.   Recent lab values indicate uncontrolled T2DM so CCM team has reinitiated contact with patient to assess barriers to DM management, including medication management.  Was unable to reach patient via telephone today and unfortunately unable to leave a voicemail as mailbox was not set up.   Follow up plan: The care management team will reach out to the patient again over the next 5-7 days.   Ruben Reason, PharmD Clinical Pharmacist [facility] [phone]

## 2019-04-04 ENCOUNTER — Telehealth: Payer: Self-pay

## 2019-04-05 ENCOUNTER — Telehealth: Payer: Self-pay

## 2019-04-05 ENCOUNTER — Ambulatory Visit: Payer: Self-pay | Admitting: Pharmacist

## 2019-04-05 DIAGNOSIS — E669 Obesity, unspecified: Secondary | ICD-10-CM

## 2019-04-05 DIAGNOSIS — E1169 Type 2 diabetes mellitus with other specified complication: Secondary | ICD-10-CM

## 2019-04-05 DIAGNOSIS — E782 Mixed hyperlipidemia: Secondary | ICD-10-CM

## 2019-04-05 NOTE — Chronic Care Management (AMB) (Signed)
  Chronic Care Management   Note  04/05/2019 Name: Bryan Flowers MRN: [id number] DOB: [birth year]  58 y.o. year old male who sees Raelyn Ensign, FNP for primary care. Patient consented to CCM services 07/2018- referral placed at that time for DM support. Patient met goals and was encouraged to contact CCM team for any additional needs.   Recent lab values indicate uncontrolled T2DM so CCM team has reinitiated contact with patient to assess barriers to DM management, including medication management.  Was unable to reach patient via telephone today and unfortunately unable to leave a voicemail as mailbox was not set up.   Follow up plan: The care management team will reach out to the patient again over the next 14 days.   Ruben Reason, PharmD Clinical Pharmacist [facility] [phone]

## 2019-04-19 ENCOUNTER — Telehealth: Payer: Self-pay

## 2019-04-19 ENCOUNTER — Ambulatory Visit: Payer: Self-pay | Admitting: Pharmacist

## 2019-04-19 DIAGNOSIS — E1169 Type 2 diabetes mellitus with other specified complication: Secondary | ICD-10-CM

## 2019-04-19 DIAGNOSIS — E669 Obesity, unspecified: Secondary | ICD-10-CM

## 2019-04-19 NOTE — Chronic Care Management (AMB) (Signed)
  Chronic Care Management   Note  04/19/2019 Name: Bryan Flowers MRN: [id number] DOB: [birth year]  58 y.o. year old male referred to Chronic Care Management team in [facility] 2019 for DM support. Patient met goals at that time. Recent lab values indicate uncontrolled T2DM so CCM team reinitiated outreach to assess barriers.   CCM team services are being closed due to three unsuccessful outreach attempts. Voicemail has not been set up at any of the outreach attempts.   Patient has appointment with Raelyn Ensign, FNP on 05/22/19. Will reintroduce CCM services at that time.   Ruben Reason, PharmD Clinical Pharmacist [facility] [phone]

## 2019-05-01 ENCOUNTER — Encounter: Payer: Self-pay | Admitting: Gastroenterology

## 2019-05-22 ENCOUNTER — Ambulatory Visit: Payer: PPO | Admitting: Family Medicine

## 2019-05-25 ENCOUNTER — Ambulatory Visit: Payer: PPO | Admitting: Family Medicine

## 2019-05-26 ENCOUNTER — Other Ambulatory Visit: Payer: Self-pay

## 2019-05-26 ENCOUNTER — Ambulatory Visit (INDEPENDENT_AMBULATORY_CARE_PROVIDER_SITE_OTHER): Payer: PPO | Admitting: Family Medicine

## 2019-05-26 ENCOUNTER — Encounter: Payer: Self-pay | Admitting: Family Medicine

## 2019-05-26 VITALS — Ht 71.0 in | Wt 231.0 lb

## 2019-05-26 DIAGNOSIS — J069 Acute upper respiratory infection, unspecified: Secondary | ICD-10-CM | POA: Diagnosis not present

## 2019-05-26 DIAGNOSIS — Z20822 Contact with and (suspected) exposure to covid-19: Secondary | ICD-10-CM

## 2019-05-26 DIAGNOSIS — Z7189 Other specified counseling: Secondary | ICD-10-CM

## 2019-05-26 DIAGNOSIS — R6889 Other general symptoms and signs: Secondary | ICD-10-CM | POA: Diagnosis not present

## 2019-05-26 NOTE — Progress Notes (Signed)
Name: Bryan Flowers   MRN: [id number]    DOB: May 08, 1961   Date:05/26/2019       Progress Note    Chief Complaint  Chief Complaint  Patient presents with   Sore Throat    fever, congested, fatigue for 1 week      Subjective:   I connected with  [name]  on 05/26/19 at  2:20 PM EDT by a video enabled telemedicine application and verified that I am speaking with the correct person using two identifiers.  I discussed the limitations of evaluation and management by telemedicine and the availability of in person appointments. The patient expressed understanding and agreed to proceed. Staff also discussed with the patient that there may be a patient responsible charge related to this service. Patient Location: home Provider Location: [facility] - in clinic/office Additional Individuals present: none  Sore Throat  This is a new problem. The current episode started in the past 7 days. The problem has been gradually worsening. The pain is moderate. Associated symptoms include congestion, coughing (dry) and headaches (hx of migraines, feels illness triggered normal type of HA). Pertinent negatives include no abdominal pain, diarrhea, drooling, ear discharge, ear pain, hoarse voice, plugged ear sensation, neck pain, shortness of breath, stridor, swollen glands, trouble swallowing or vomiting. He has had no exposure to strep or mono. He has tried acetaminophen for the symptoms. The treatment provided no relief.  URI  This is a new problem. The current episode started in the past 7 days. Maximum temperature: subjective fever. Associated symptoms include congestion, coughing (dry), headaches (hx of migraines, feels illness triggered normal type of HA), rhinorrhea and a sore throat. Pertinent negatives include no abdominal pain, chest pain, diarrhea, dysuria, ear pain, joint pain, joint swelling, nausea, neck pain, plugged ear sensation, rash, sinus pain, sneezing, swollen glands, vomiting or  wheezing. He has tried decongestant and acetaminophen for the symptoms. The treatment provided no relief.  Possible exposure to someone in apartment complex a month ago with COVID, he self quarantined for 14 d and did not develop sx until 7 days ago 05/19/2019. Nasal sx, feels stuffy, hx of environmental allergies/allergic to dust  Does feel somewhat like strep throat. Dry cough from post nasal drip, no CP, SOB, wheeze Subjective fevers with sweats and chills, sx gradually worsening "getting a little worse" No N/V/D, some mild constipation which is unusual for him Feels fatigued and resting  Taking cold medicine like alka seltzer plus for stuffiness CBG normally 120's in am, lately in 190's, no weight change, denies polyuria, polydipsia, nocturia, excessive hunger, weight loss, but eating less and drinking less due to sore throat pain.     Patient Active Problem List   Diagnosis Date Noted   Morbid obesity ([place]) 02/16/2019   Pure hypercholesterolemia 02/16/2019   Grief 02/16/2019   Major depression, recurrent ([place]) 03/14/2018   Bipolar 1 disorder, manic, moderate (HCC)    Vitamin D deficiency 09/10/2017   Gastroesophageal reflux disease without esophagitis 09/10/2017   History of iron deficiency anemia 03/30/2016   History of iron deficiency anemia 03/24/2016   History of GI bleed 03/15/2016   Hyponatremia 03/15/2016   Parkinson disease ([place])    Hypertension    Anxiety 08/19/2015   Abnormal ECG 04/09/2015   Back ache 04/09/2015   Diabetes mellitus type 2 in obese ([place]) 04/09/2015   Obstructive sleep apnea 04/09/2015   HDL deficiency 04/09/2015   Hearing loss, sensorineural, combined types 04/09/2015   HLD (hyperlipidemia) 04/09/2015  Vocal cord atrophy 05/29/2014   Fibromyalgia 03/15/2014   Testicular hypofunction 05/17/2009   Migraine with aura 05/14/2009    Social History   Tobacco Use   Smoking status: Never Smoker   Smokeless tobacco: Never  Used   Tobacco comment: Non-smoker  Substance Use Topics   Alcohol use: No    Alcohol/week: 0.0 standard drinks     Current Outpatient Medications:    acetaminophen (TYLENOL) 500 MG tablet, Take 1,000 mg by mouth every 6 (six) hours as needed (pain)., Disp: , Rfl:    Armodafinil 250 MG tablet, Take by mouth., Disp: , Rfl:    atorvastatin (LIPITOR) 20 MG tablet, Take 1 tablet (20 mg total) by mouth daily., Disp: 90 tablet, Rfl: 3   b complex vitamins capsule, Take 1 capsule by mouth daily., Disp: , Rfl:    carbidopa-levodopa (SINEMET CR) 50-200 MG per tablet, Take 1 tablet by mouth 3 (three) times daily. , Disp: , Rfl:    cholecalciferol (VITAMIN D3) 25 MCG (1000 UT) tablet, Take 1,000 Units by mouth daily., Disp: , Rfl:    DULoxetine (CYMBALTA) 60 MG capsule, Take 120 mg by mouth at bedtime., Disp: , Rfl:    Empagliflozin-metFORMIN HCl (SYNJARDY) 12.02-999 MG TABS, Take 1 tablet by mouth 2 (two) times daily at 10 AM and 5 PM., Disp: 60 tablet, Rfl: 3   glipiZIDE (GLUCOTROL XL) 5 MG 24 hr tablet, Take 1 tablet (5 mg total) by mouth daily with breakfast., Disp: 30 tablet, Rfl: 5   glucose blood test strip, Check once daily fasting, and up to 3 times a day as needed., Disp: 100 each, Rfl: 12   Insulin Pen Needle (NOVOFINE) 32G X 6 MM MISC, 1 each by Does not apply route daily., Disp: 100 each, Rfl: 3   lamoTRIgine (LAMICTAL) 200 MG tablet, Take 200 mg by mouth at bedtime., Disp: , Rfl:    LORazepam (ATIVAN) 1 MG tablet, TK 1 T PO TID PRN, Disp: , Rfl:    LYRICA 50 MG capsule, take 1 capsule at bedtime, Disp: 30 capsule, Rfl: 5   ondansetron (ZOFRAN) 4 MG tablet, Take 1 tablet (4 mg total) by mouth every 8 (eight) hours as needed for nausea or vomiting., Disp: 20 tablet, Rfl: 0   propranolol ER (INDERAL LA) 60 MG 24 hr capsule, Take 60 mg by mouth at bedtime. , Disp: , Rfl:    QUEtiapine (SEROQUEL XR) 400 MG 24 hr tablet, TK 1 T PO QD AT 8PM ONE HOUR BEFORE OR AFTER A MEAL,  Disp: , Rfl: 2   quinapril-hydrochlorothiazide (ACCURETIC) 20-12.5 MG tablet, Take 1 tablet by mouth daily., Disp: 30 tablet, Rfl: 3   rOPINIRole (REQUIP) 3 MG tablet, Take by mouth., Disp: , Rfl:    XULTOPHY 100-3.6 UNIT-MG/ML SOPN, INJECT 46 UNITS UNDER THE SKIN AT BEDTIME, Disp: 15 mL, Rfl: 2  No Known Allergies  I personally reviewed active problem list, medication list, lab results with the patient/caregiver today.  Review of Systems  HENT: Positive for congestion, rhinorrhea and sore throat. Negative for drooling, ear discharge, ear pain, hoarse voice, sinus pain, sneezing and trouble swallowing.   Eyes: Negative.   Respiratory: Positive for cough (dry). Negative for hemoptysis, sputum production, shortness of breath, wheezing and stridor.   Cardiovascular: Negative.  Negative for chest pain, palpitations and orthopnea.  Gastrointestinal: Negative.  Negative for abdominal pain, diarrhea, nausea and vomiting.  Genitourinary: Negative.  Negative for dysuria.  Musculoskeletal: Positive for myalgias. Negative for back pain, joint  pain and neck pain.  Skin: Negative.  Negative for rash.  Neurological: Positive for headaches (hx of migraines, feels illness triggered normal type of HA). Negative for focal weakness.  Endo/Heme/Allergies: Negative.   Psychiatric/Behavioral: Negative.   All other systems reviewed and are negative.    Objective:   Virtual encounter, vitals limited, only able to obtain the following Today's Vitals   05/26/19 0941  PainSc: 3    There is no height or weight on file to calculate BMI. Nursing Note and Vital Signs reviewed.    Physical Exam Nursing note reviewed.  HENT:     Head:     Comments: Endorses mildly erythematous OP and stated no sinus ttp to b/l maxillary sinus and frontal sinus Neck:     Trachea: Phonation normal.  Neurological:     Mental Status: He is alert.  Psychiatric:        Mood and Affect: Mood normal.        Behavior:  Behavior normal.     PE limited by telephone encounter       No results found for this or any previous visit (from the past 72 hour(s)).  Assessment and Plan:     ICD-10-CM   1. Upper respiratory tract infection, unspecified type  J06.9 Novel Coronavirus, NAA ([facility])   viral, r/o COVID?, pt also concerned about strep, unlikely with nasal sx and cough, can test next week if COVID neg, tx supportive, red flags reviewed  2. Advice Given About Covid-19 Virus by Telephone  Z71.89   3. Suspected Covid-19 Virus Infection  R68.89 Novel Coronavirus, NAA ([facility])   r/o with COVID testing   Symptoms are mild, cannot rule out possibility of mild COVID-19 illness vs other viral etiologies COVID testing offered and ordered, instructions on how to complete COVID testing reviewed with pt.  Recommended to stay home and self isolate per CDC guidelines until symptom based criteria is completed.  May return sooner if COVID test is negative, but this would require some communication and follow up between pt and clinic/PCP.  Follow up by telephone to [place] (PCP) or 911 if having any of the following (new or worsening): Shortness of breath, difficulty breathing, chest pain or pressure, confusion, blue lips  -Red flags and when to present for emergency care or RTC including fever >101.47F, chest pain, shortness of breath, new/worsening/un-resolving symptoms,  reviewed with patient at time of visit. Follow up and care instructions discussed and provided in AVS. - I discussed the assessment and treatment plan with the patient. The patient was provided an opportunity to ask questions and all were answered. The patient agreed with the plan and demonstrated an understanding of the instructions.  I provided 18 minutes of non-face-to-face time during this encounter.  [name], PA-C 05/26/19 9:54 AM

## 2019-05-27 LAB — NOVEL CORONAVIRUS, NAA: SARS-CoV-2, NAA: NOT DETECTED

## 2019-05-29 ENCOUNTER — Other Ambulatory Visit: Payer: Self-pay

## 2019-05-29 ENCOUNTER — Encounter: Payer: Self-pay | Admitting: Family Medicine

## 2019-05-29 ENCOUNTER — Ambulatory Visit (INDEPENDENT_AMBULATORY_CARE_PROVIDER_SITE_OTHER): Payer: PPO | Admitting: Family Medicine

## 2019-05-29 DIAGNOSIS — F419 Anxiety disorder, unspecified: Secondary | ICD-10-CM

## 2019-05-29 DIAGNOSIS — I10 Essential (primary) hypertension: Secondary | ICD-10-CM | POA: Diagnosis not present

## 2019-05-29 DIAGNOSIS — E669 Obesity, unspecified: Secondary | ICD-10-CM

## 2019-05-29 DIAGNOSIS — E559 Vitamin D deficiency, unspecified: Secondary | ICD-10-CM | POA: Diagnosis not present

## 2019-05-29 DIAGNOSIS — G20 Parkinson's disease: Secondary | ICD-10-CM

## 2019-05-29 DIAGNOSIS — G4733 Obstructive sleep apnea (adult) (pediatric): Secondary | ICD-10-CM

## 2019-05-29 DIAGNOSIS — K219 Gastro-esophageal reflux disease without esophagitis: Secondary | ICD-10-CM

## 2019-05-29 DIAGNOSIS — E7800 Pure hypercholesterolemia, unspecified: Secondary | ICD-10-CM | POA: Diagnosis not present

## 2019-05-29 DIAGNOSIS — M797 Fibromyalgia: Secondary | ICD-10-CM | POA: Diagnosis not present

## 2019-05-29 DIAGNOSIS — J069 Acute upper respiratory infection, unspecified: Secondary | ICD-10-CM

## 2019-05-29 DIAGNOSIS — F3112 Bipolar disorder, current episode manic without psychotic features, moderate: Secondary | ICD-10-CM | POA: Diagnosis not present

## 2019-05-29 DIAGNOSIS — F4321 Adjustment disorder with depressed mood: Secondary | ICD-10-CM

## 2019-05-29 DIAGNOSIS — E1169 Type 2 diabetes mellitus with other specified complication: Secondary | ICD-10-CM

## 2019-05-29 NOTE — Progress Notes (Signed)
Name: Bryan Flowers   MRN: [id number]    DOB: 10/01/1961   Date:05/29/2019       Progress Note  Subjective  Chief Complaint  Chief Complaint  Patient presents with  . Diabetes  . Hyperlipidemia  . Hypertension  . Sore Throat    bodyache,fatigue    I connected with  [name] on 05/29/19 at  1:20 PM EDT by telephone and verified that I am speaking with the correct person using two identifiers.  I discussed the limitations, risks, security and privacy concerns of performing an evaluation and management service by telephone and the availability of in person appointments. Staff also discussed with the patient that there may be a patient responsible charge related to this service. Patient Location: Home Provider Location: Office Additional Individuals present: None  HPI  URI: He had negative COVID-19.  Still having fatigue, sore throat, and body aches.  Has a dry hacking cough, subjective chills. Symptoms have been about 7 weeks. Declines magic mouthwash as he cannot afford at this time. Taking Tylenol PRN.   Grief:His wife passed away since last visit.He is staying connected with friends, [name], and family. Says he is not staying isolated - calling people, walking. The 1 year anniversary of his wife's passing was August 9th and he saw family and friends. Taking a grief class at church over the telephone now.    Bipolar disorder: under the care of Dr. [name] at [facility]. He states his last episode was of depression but feeling well at this time, has been feeling very level. No suicidal thoughts or ideation.  He also sees a Social worker at [place]. Stable and unchanged.  Diabetes type II: he has been on Xultophy(50 units),Synjardy (12.5-1000mg  BID,lastA1C was10.6%.  He is no longer taking glipizide, though it was prescribed to him.Lowest -Highest Range -  Highest 200, lowest 100.Denies polydipsia, polyuria, or polyphagia.  He  denied side effects from DM medication. Uriune micro is UTD, taking ACEI; taking atorvastatin. Needs Eye examination - was canceled due to COVID-19.He is down 13lbs  Parkinson's disease: seesDr. [name], taking medication, tremors are under control and gait is back to normal.  Has not fallen in well over a year. Still under the care of neurologistand doing well. Taking requip for restless leg and on sinemet for Parkinson's. Unchanged.   FMS: He is taking lyrica 50mg  QHS and this seems to be much more effective than gabapentin that he was on before. Pain is well controlled; fatigue is still a daily struggle on a regular basis; right now having a flare with his URI.  HTN: Unable to check BP's at home, were upper limit of normal at last visit.  Taking medicaitons as prescribed, and we will maintain today. No chest pain or palpitation, no shortness of breath, lightheadedness or orthostatic dizziness, occassional mild dependent BLE edema.  GERD: Has history of bleeding ulcers. He had follow up last year with GI, follows up once a year. No regurgitation or vomiting, blood in stool, or dark and tarry stools, abdominal pain. Not on any medication - diet controlled only.  Vitamin D Deficiency:Low at last visit - taking supplementation now - 1000iu daily  HLD: Restarted atorvastatin, due for recheck. Last lipids were not at goal. .Denies chest pain, palpitations, no myalgias.  OSA: uses his CPAP nightly; it works well for him, no daytime sleepiness.Taking Armodafinil - Rx'd by Psychiatry.  Doing well aside from current fatigue due to URI.  Obesity: Walking  30 minutes a few days a week - though has been sick the last week or so; cooking himself now - trying not to eat out. Recommend he continue to make healthy lifestyle changes. No changes.  Patient Active Problem List   Diagnosis Date Noted  . Morbid obesity ([place]) 02/16/2019  . Pure hypercholesterolemia 02/16/2019  . Grief 02/16/2019  .  Major depression, recurrent ([place]) 03/14/2018  . Bipolar 1 disorder, manic, moderate ([place])   . Vitamin D deficiency 09/10/2017  . Gastroesophageal reflux disease without esophagitis 09/10/2017  . History of iron deficiency anemia 03/30/2016  . History of iron deficiency anemia 03/24/2016  . History of GI bleed 03/15/2016  . Hyponatremia 03/15/2016  . Parkinson disease ([place])   . Hypertension   . Anxiety 08/19/2015  . Abnormal ECG 04/09/2015  . Back ache 04/09/2015  . Diabetes mellitus type 2 in obese ([place]) 04/09/2015  . Obstructive sleep apnea 04/09/2015  . HDL deficiency 04/09/2015  . Hearing loss, sensorineural, combined types 04/09/2015  . HLD (hyperlipidemia) 04/09/2015  . Vocal cord atrophy 05/29/2014  . Fibromyalgia 03/15/2014  . Testicular hypofunction 05/17/2009  . Migraine with aura 05/14/2009    Past Surgical History:  Procedure Laterality Date  . ESOPHAGOGASTRODUODENOSCOPY Left 03/16/2016   Procedure: ESOPHAGOGASTRODUODENOSCOPY (EGD);  Surgeon: Manus Gunning, MD;  Location: [facility];  Service: Gastroenterology;  Laterality: Left;  . FOOT FRACTURE SURGERY Right     Family History  Problem Relation Age of Onset  . Cancer Mother   . Heart disease Father   . Arthritis Brother     Social History   Socioeconomic History  . Marital status: Widowed    Spouse name: Not on file  . Number of children: 0  . Years of education: Not on file  . Highest education level: High school graduate  Occupational History    Employer: DISABLED  Social Needs  . Financial resource strain: Not hard at all  . Food insecurity    Worry: Never true    Inability: Never true  . Transportation needs    Medical: No    Non-medical: No  Tobacco Use  . Smoking status: Never Smoker  . Smokeless tobacco: Never Used  . Tobacco comment: Non-smoker  Substance and Sexual Activity  . Alcohol use: No    Alcohol/week: 0.0 standard drinks  . Drug use: No  . Sexual activity: Not  Currently    Partners: Female  Lifestyle  . Physical activity    Days per week: 0 days    Minutes per session: 0 min  . Stress: Only a little  Relationships  . Social connections    Talks on phone: More than three times a week    Gets together: More than three times a week    Attends religious service: More than 4 times per year    Active member of club or organization: Yes    Attends meetings of clubs or organizations: More than 4 times per year    Relationship status: Widowed  . Intimate partner violence    Fear of current or ex partner: No    Emotionally abused: No    Physically abused: No    Forced sexual activity: No  Other Topics Concern  . Not on file  Social History Narrative  . Not on file     Current Outpatient Medications:  .  acetaminophen (TYLENOL) 500 MG tablet, Take 1,000 mg by mouth every 6 (six) hours as needed (pain)., Disp: , Rfl:  .  Armodafinil 250 MG tablet, Take by mouth., Disp: , Rfl:  .  atorvastatin (LIPITOR) 20 MG tablet, Take 1 tablet (20 mg total) by mouth daily., Disp: 90 tablet, Rfl: 3 .  b complex vitamins capsule, Take 1 capsule by mouth daily., Disp: , Rfl:  .  carbidopa-levodopa (SINEMET CR) 50-200 MG per tablet, Take 1 tablet by mouth 3 (three) times daily. , Disp: , Rfl:  .  cholecalciferol (VITAMIN D3) 25 MCG (1000 UT) tablet, Take 1,000 Units by mouth daily., Disp: , Rfl:  .  DULoxetine (CYMBALTA) 60 MG capsule, Take 120 mg by mouth at bedtime., Disp: , Rfl:  .  Empagliflozin-metFORMIN HCl (SYNJARDY) 12.02-999 MG TABS, Take 1 tablet by mouth 2 (two) times daily at 10 AM and 5 PM., Disp: 60 tablet, Rfl: 3 .  glipiZIDE (GLUCOTROL XL) 5 MG 24 hr tablet, Take 1 tablet (5 mg total) by mouth daily with breakfast., Disp: 30 tablet, Rfl: 5 .  glucose blood test strip, Check once daily fasting, and up to 3 times a day as needed., Disp: 100 each, Rfl: 12 .  Insulin Pen Needle (NOVOFINE) 32G X 6 MM MISC, 1 each by Does not apply route daily., Disp:  100 each, Rfl: 3 .  lamoTRIgine (LAMICTAL) 200 MG tablet, Take 200 mg by mouth at bedtime., Disp: , Rfl:  .  LORazepam (ATIVAN) 1 MG tablet, TK 1 T PO TID PRN, Disp: , Rfl:  .  LYRICA 50 MG capsule, take 1 capsule at bedtime, Disp: 30 capsule, Rfl: 5 .  ondansetron (ZOFRAN) 4 MG tablet, Take 1 tablet (4 mg total) by mouth every 8 (eight) hours as needed for nausea or vomiting., Disp: 20 tablet, Rfl: 0 .  propranolol ER (INDERAL LA) 60 MG 24 hr capsule, Take 60 mg by mouth at bedtime. , Disp: , Rfl:  .  QUEtiapine (SEROQUEL XR) 400 MG 24 hr tablet, TK 1 T PO QD AT 8PM ONE HOUR BEFORE OR AFTER A MEAL, Disp: , Rfl: 2 .  quinapril-hydrochlorothiazide (ACCURETIC) 20-12.5 MG tablet, Take 1 tablet by mouth daily., Disp: 30 tablet, Rfl: 3 .  rOPINIRole (REQUIP) 3 MG tablet, Take by mouth., Disp: , Rfl:  .  XULTOPHY 100-3.6 UNIT-MG/ML SOPN, INJECT 46 UNITS UNDER THE SKIN AT BEDTIME, Disp: 15 mL, Rfl: 2  No Known Allergies  I personally reviewed active problem list, medication list, allergies, notes from last encounter, lab results with the patient/caregiver today.   ROS  Ten systems reviewed and is negative except as mentioned in HPI  Objective  Virtual encounter, vitals not obtained.  There is no height or weight on file to calculate BMI.  Physical Exam  Pulmonary/Chest: Effort normal. No respiratory distress. Speaking in complete sentences Neurological: Pt is alert and oriented to person, place, and time. Speech is normal Psychiatric: Patient has a normal mood and affect. behavior is normal. Judgment and thought content normal.  No results found for this or any previous visit (from the past 72 hour(s)).  PHQ2/9: Depression screen [facility] 2/9 05/29/2019 05/26/2019 02/16/2019 11/14/2018 09/22/2018  Decreased Interest 0 0 0 1 0  Down, Depressed, Hopeless 0 0 0 1 2  PHQ - 2 Score 0 0 0 2 2  Altered sleeping 0 0 1 3 2   Tired, decreased energy 0 0 0 1 0  Change in appetite 0 0 0 0 0  Feeling bad  or failure about yourself  0 0 0 1 0  Trouble concentrating 0 0 0 0 0  Moving slowly or fidgety/restless 0 0 0 0 0  Suicidal thoughts 0 0 0 0 0  PHQ-9 Score 0 0 1 7 4   Difficult doing work/chores Not difficult at all Not difficult at all Not difficult at all Somewhat difficult -  Some recent data might be hidden   PHQ-2/9 Result is negative.    Fall Risk: Fall Risk  05/29/2019 05/26/2019 02/16/2019 11/14/2018 09/22/2018  Falls in the past year? 0 0 0 1 1  Number falls in past yr: 0 0 0 - 1  Comment - - - - 1 fall onto grass while helping to move a couch with his brother   Injury with Fall? 0 0 0 0 0  Risk for fall due to : - - - History of fall(s) -  Follow up Falls evaluation completed Falls evaluation completed Falls evaluation completed Falls evaluation completed Falls evaluation completed;Falls prevention discussed    Assessment & Plan  1. Upper respiratory tract infection, unspecified type - Monitor symptoms, follow up in 3-4 days if not improving - may consider retesting for COVID  2. Anxiety - Doing well  3. Grief - Doing well - seeing family and friends, attending fried counseling  4. Bipolar 1 disorder, manic, moderate (HCC) - Doing well, stable  5. Diabetes mellitus type 2 in obese [facility]) - Needs labs; not taking his gipizide. May consider referral to endocrinology if still not improving - Microalbumin / creatinine urine ratio - COMPLETE METABOLIC PANEL WITH GFR - Hemoglobin A1c - Lipid panel  6. Parkinson disease ([place]) - Stable, continue with neurology and current medications  7. Fibromyalgia - Stable with medication regimen, though having some flare of pain with current URI  8. Essential hypertension - COMPLETE METABOLIC PANEL WITH GFR  9. Gastroesophageal reflux disease without esophagitis - Diet controlled  10. Vitamin D deficiency - Taking supplement  11. Pure hypercholesterolemia - Lipid panel  12. Obstructive sleep apnea - Using CPAP, continue  armodafonil  13. Morbid obesity ([place]) - Discussed importance of 150 minutes of physical activity weekly, eat two servings of fish weekly, eat one serving of tree nuts ( cashews, pistachios, pecans, almonds.Marland Kitchen) every other day, eat 6 servings of fruit/vegetables daily and drink plenty of water and avoid sweet beverages.  - COMPLETE METABOLIC PANEL WITH GFR - Lipid panel  I discussed the assessment and treatment plan with the patient. The patient was provided an opportunity to ask questions and all were answered. The patient agreed with the plan and demonstrated an understanding of the instructions.   The patient was advised to call back or seek an in-person evaluation if the symptoms worsen or if the condition fails to improve as anticipated.  I provided 23 minutes of non-face-to-face time during this encounter.  Hubbard Hartshorn, FNP

## 2019-06-12 ENCOUNTER — Telehealth: Payer: Self-pay | Admitting: Family Medicine

## 2019-06-12 NOTE — Telephone Encounter (Signed)
-----   Message from Hubbard Hartshorn, [place] sent at 05/29/2019  1:54 PM EDT ----- Regarding: Call patient to come in for labs Call patient to come in for labs

## 2019-06-13 NOTE — Telephone Encounter (Signed)
Left message for patient

## 2019-08-20 ENCOUNTER — Other Ambulatory Visit: Payer: Self-pay | Admitting: Family Medicine

## 2019-08-20 DIAGNOSIS — M797 Fibromyalgia: Secondary | ICD-10-CM

## 2019-08-21 DIAGNOSIS — G4726 Circadian rhythm sleep disorder, shift work type: Secondary | ICD-10-CM | POA: Diagnosis not present

## 2019-08-21 DIAGNOSIS — F3181 Bipolar II disorder: Secondary | ICD-10-CM | POA: Diagnosis not present

## 2019-08-21 DIAGNOSIS — G2581 Restless legs syndrome: Secondary | ICD-10-CM | POA: Diagnosis not present

## 2019-08-29 ENCOUNTER — Other Ambulatory Visit: Payer: Self-pay

## 2019-08-29 ENCOUNTER — Ambulatory Visit (INDEPENDENT_AMBULATORY_CARE_PROVIDER_SITE_OTHER): Payer: PPO | Admitting: Family Medicine

## 2019-08-29 ENCOUNTER — Encounter: Payer: Self-pay | Admitting: Family Medicine

## 2019-08-29 DIAGNOSIS — M797 Fibromyalgia: Secondary | ICD-10-CM | POA: Diagnosis not present

## 2019-08-29 DIAGNOSIS — E559 Vitamin D deficiency, unspecified: Secondary | ICD-10-CM

## 2019-08-29 DIAGNOSIS — G20 Parkinson's disease: Secondary | ICD-10-CM

## 2019-08-29 DIAGNOSIS — G4733 Obstructive sleep apnea (adult) (pediatric): Secondary | ICD-10-CM

## 2019-08-29 DIAGNOSIS — F3112 Bipolar disorder, current episode manic without psychotic features, moderate: Secondary | ICD-10-CM | POA: Diagnosis not present

## 2019-08-29 DIAGNOSIS — Z1212 Encounter for screening for malignant neoplasm of rectum: Secondary | ICD-10-CM

## 2019-08-29 DIAGNOSIS — K219 Gastro-esophageal reflux disease without esophagitis: Secondary | ICD-10-CM

## 2019-08-29 DIAGNOSIS — F4321 Adjustment disorder with depressed mood: Secondary | ICD-10-CM

## 2019-08-29 DIAGNOSIS — E1169 Type 2 diabetes mellitus with other specified complication: Secondary | ICD-10-CM | POA: Diagnosis not present

## 2019-08-29 DIAGNOSIS — E7800 Pure hypercholesterolemia, unspecified: Secondary | ICD-10-CM | POA: Diagnosis not present

## 2019-08-29 DIAGNOSIS — F419 Anxiety disorder, unspecified: Secondary | ICD-10-CM

## 2019-08-29 DIAGNOSIS — Z1211 Encounter for screening for malignant neoplasm of colon: Secondary | ICD-10-CM

## 2019-08-29 DIAGNOSIS — I10 Essential (primary) hypertension: Secondary | ICD-10-CM

## 2019-08-29 DIAGNOSIS — J069 Acute upper respiratory infection, unspecified: Secondary | ICD-10-CM

## 2019-08-29 DIAGNOSIS — E119 Type 2 diabetes mellitus without complications: Secondary | ICD-10-CM

## 2019-08-29 DIAGNOSIS — G20A1 Parkinson's disease without dyskinesia, without mention of fluctuations: Secondary | ICD-10-CM

## 2019-08-29 DIAGNOSIS — E669 Obesity, unspecified: Secondary | ICD-10-CM

## 2019-08-29 MED ORDER — QUINAPRIL-HYDROCHLOROTHIAZIDE 20-12.5 MG PO TABS: 1.0000 | ORAL_TABLET | Freq: Every day | ORAL | 3 refills | Status: DC

## 2019-08-29 MED ORDER — SYNJARDY 12.5-1000 MG PO TABS: 1.0000 | ORAL_TABLET | Freq: Two times a day (BID) | ORAL | 3 refills | Status: DC

## 2019-08-29 MED ORDER — FLUTICASONE PROPIONATE 50 MCG/ACT NA SUSP: 2.0000 | Freq: Every day | NASAL | 6 refills | Status: DC

## 2019-08-29 MED ORDER — PREGABALIN 50 MG PO CAPS: 50.0000 mg | ORAL_CAPSULE | Freq: Two times a day (BID) | ORAL | 1 refills | Status: DC

## 2019-08-29 MED ORDER — XULTOPHY 100-3.6 UNIT-MG/ML ~~LOC~~ SOPN: 50.0000 [IU] | PEN_INJECTOR | Freq: Every day | SUBCUTANEOUS | 2 refills | Status: DC

## 2019-08-29 MED ORDER — LORATADINE 10 MG PO TABS: 10.0000 mg | ORAL_TABLET | Freq: Every day | ORAL | 0 refills | Status: DC

## 2019-08-29 NOTE — Progress Notes (Signed)
Name: Bryan Flowers   MRN: [id number]    DOB: 10-May-1961   Date:08/29/2019       Progress Note  Subjective  Chief Complaint  No chief complaint on file.   I connected with  [name] on 08/29/19 at  1:00 PM EST by telephone and verified that I am speaking with the correct person using two identifiers.  I discussed the limitations, risks, security and privacy concerns of performing an evaluation and management service by telephone and the availability of in person appointments. Staff also discussed with the patient that there may be a patient responsible charge related to this service. Patient Location: Musician - parked Provider Location: Office Additional Individuals present: None  HPI  Respiratory Illness: Reports nasal congestion, sore throat, subjective fevers, fatigue, and occasional dry cough since 08/26/2019.  Denies loss of taste/smell, chest pain, shortness of breath.  He does live in 24 resident small apartment home; no recent travel, no recent know exposure to COVID-19.  Not taking any medication at this time.  Grief: He is staying connected with friends, [name], and family. Says he is not staying isolated- calling people, walking. The 1 year anniversary of his wife's passing was August 9th and he saw family and friends.  He is starting to feel less numb and is trying to prepare for the holidays; overall he says he does not feel depressed. Taking a grief class at church over the telephone now.    Bipolar disorder: under the care of Dr. [name] at [facility]. He states his last episode was of depression but feeling well at this time, has been feeling very level. No suicidal thoughts or ideation, no HI.  He also sees a Social worker at [facility]. Stable and unchanged - doing well on current medication regimen.  Diabetes type II: he has been on Xultophy(50 units),Synjardy (12.5-1000mg  BID),lastA1C was10.3%.  He is no longer taking  glipizide, though it was prescribed to him. Highest 180, lowest 101 with some shakiness/diaphoresis.Denies polydipsia, polyuria, or polyphagia.He denied side effects from DM medication. Uriune micro is due, taking ACEI;taking atorvastatin. Needs Eye examination- canceled due to COVID-19.  Parkinson's disease: SeesDr. [name], taking medication, tremors are under control and gait is back to normal - no recent falls.  Still under the care of neurologistand doing well. Taking requip for restless leg and on sinemet for Parkinson's.  Stable - states feels really good right now, does not feel like his symptoms have progressed recently.  FMS: He is taking Cymbalta, lyrica 50mg  QHS - states his pain has been increased over the last couple of months - mostly in hips/legs.  Was on gabapentin in the past, but lyrica has seemed to work better for him.  We will add 50mg  QAM to see if this helps reduce pain levels.  HTN: Unable to check BP's at home, were upper limit of normal at last visit.  Taking medicaitons as prescribed, and we will maintain today, though he is due for in-person BP check once cold symptoms have resolved - will check back with him in 2 weeks about this. No chest pain or palpitation, no shortness of breath, lightheadedness or orthostatic dizziness, no BLE edema (elevates at night and has had no issues), no vision changes.  GERD:Has history of bleeding ulcers. He had follow up last year with GI, follows up once a year. No regurgitation or vomiting, blood in stool, or dark and tarry stools, abdominal pain, or difficulty swallowing.Diet controlled only at  this time, no concerns.  Vitamin D Deficiency:Low at last visit - taking supplementation - 1000iu daily and is compliant with this.  Will recheck labs when able to come in.  [id number] atorvastatin, due for recheck.Last lipids were not at goal. .Denies chest pain, palpitations, no myalgias.  OSA: uses his CPAP nightly; it  works well for him, no daytime sleepiness.Taking Armodafinil - Rx'd by Psychiatry. Doing well on current dose, feels rested in the morning with Armodafonil.  Obesity: Walking every day, usually about 30 minutes.  Cooking himself now - trying not to eat out. Recommend he continue to make healthy lifestyle changes. No changes.  Patient Active Problem List   Diagnosis Date Noted  . Morbid obesity ([place]) 02/16/2019  . Pure hypercholesterolemia 02/16/2019  . Grief 02/16/2019  . Major depression, recurrent ([place]) 03/14/2018  . Bipolar 1 disorder, manic, moderate ([place])   . Vitamin D deficiency 09/10/2017  . Gastroesophageal reflux disease without esophagitis 09/10/2017  . History of iron deficiency anemia 03/30/2016  . History of iron deficiency anemia 03/24/2016  . History of GI bleed 03/15/2016  . Hyponatremia 03/15/2016  . Parkinson disease ([place])   . Hypertension   . Anxiety 08/19/2015  . Abnormal ECG 04/09/2015  . Back ache 04/09/2015  . Diabetes mellitus type 2 in obese ([place]) 04/09/2015  . Obstructive sleep apnea 04/09/2015  . HDL deficiency 04/09/2015  . Hearing loss, sensorineural, combined types 04/09/2015  . HLD (hyperlipidemia) 04/09/2015  . Vocal cord atrophy 05/29/2014  . Fibromyalgia 03/15/2014  . Testicular hypofunction 05/17/2009  . Migraine with aura 05/14/2009    Past Surgical History:  Procedure Laterality Date  . ESOPHAGOGASTRODUODENOSCOPY Left 03/16/2016   Procedure: ESOPHAGOGASTRODUODENOSCOPY (EGD);  Surgeon: Manus Gunning, MD;  Location: [facility];  Service: Gastroenterology;  Laterality: Left;  . FOOT FRACTURE SURGERY Right     Family History  Problem Relation Age of Onset  . Cancer Mother   . Heart disease Father   . Arthritis Brother     Social History   Socioeconomic History  . Marital status: Widowed    Spouse name: Not on file  . Number of children: 0  . Years of education: Not on file  . Highest education level: High school  graduate  Occupational History    Employer: DISABLED  Social Needs  . Financial resource strain: Not hard at all  . Food insecurity    Worry: Never true    Inability: Never true  . Transportation needs    Medical: No    Non-medical: No  Tobacco Use  . Smoking status: Never Smoker  . Smokeless tobacco: Never Used  . Tobacco comment: Non-smoker  Substance and Sexual Activity  . Alcohol use: No    Alcohol/week: 0.0 standard drinks  . Drug use: No  . Sexual activity: Not Currently    Partners: Female  Lifestyle  . Physical activity    Days per week: 0 days    Minutes per session: 0 min  . Stress: Only a little  Relationships  . Social connections    Talks on phone: More than three times a week    Gets together: More than three times a week    Attends religious service: More than 4 times per year    Active member of club or organization: Yes    Attends meetings of clubs or organizations: More than 4 times per year    Relationship status: Widowed  . Intimate partner violence    Fear of current or  ex partner: No    Emotionally abused: No    Physically abused: No    Forced sexual activity: No  Other Topics Concern  . Not on file  Social History Narrative  . Not on file     Current Outpatient Medications:  .  acetaminophen (TYLENOL) 500 MG tablet, Take 1,000 mg by mouth every 6 (six) hours as needed (pain)., Disp: , Rfl:  .  Armodafinil 250 MG tablet, Take by mouth., Disp: , Rfl:  .  atorvastatin (LIPITOR) 20 MG tablet, Take 1 tablet (20 mg total) by mouth daily., Disp: 90 tablet, Rfl: 3 .  b complex vitamins capsule, Take 1 capsule by mouth daily., Disp: , Rfl:  .  carbidopa-levodopa (SINEMET CR) 50-200 MG per tablet, Take 1 tablet by mouth 3 (three) times daily. , Disp: , Rfl:  .  cholecalciferol (VITAMIN D3) 25 MCG (1000 UT) tablet, Take 1,000 Units by mouth daily., Disp: , Rfl:  .  DULoxetine (CYMBALTA) 60 MG capsule, Take 120 mg by mouth at bedtime., Disp: , Rfl:  .   Empagliflozin-metFORMIN HCl (SYNJARDY) 12.02-999 MG TABS, Take 1 tablet by mouth 2 (two) times daily at 10 AM and 5 PM., Disp: 60 tablet, Rfl: 3 .  glucose blood test strip, Check once daily fasting, and up to 3 times a day as needed., Disp: 100 each, Rfl: 12 .  Insulin Pen Needle (NOVOFINE) 32G X 6 MM MISC, 1 each by Does not apply route daily., Disp: 100 each, Rfl: 3 .  lamoTRIgine (LAMICTAL) 200 MG tablet, Take 200 mg by mouth at bedtime., Disp: , Rfl:  .  LORazepam (ATIVAN) 1 MG tablet, TK 1 T PO TID PRN, Disp: , Rfl:  .  ondansetron (ZOFRAN) 4 MG tablet, Take 1 tablet (4 mg total) by mouth every 8 (eight) hours as needed for nausea or vomiting., Disp: 20 tablet, Rfl: 0 .  pregabalin (LYRICA) 50 MG capsule, TAKE ONE CAPSULE BY MOUTH AT BEDTIME, Disp: 30 capsule, Rfl: 5 .  propranolol ER (INDERAL LA) 60 MG 24 hr capsule, Take 60 mg by mouth at bedtime. , Disp: , Rfl:  .  QUEtiapine (SEROQUEL XR) 400 MG 24 hr tablet, TK 1 T PO QD AT 8PM ONE HOUR BEFORE OR AFTER A MEAL, Disp: , Rfl: 2 .  quinapril-hydrochlorothiazide (ACCURETIC) 20-12.5 MG tablet, Take 1 tablet by mouth daily., Disp: 30 tablet, Rfl: 3 .  rOPINIRole (REQUIP) 3 MG tablet, Take by mouth., Disp: , Rfl:  .  XULTOPHY 100-3.6 UNIT-MG/ML SOPN, INJECT 46 UNITS UNDER THE SKIN AT BEDTIME, Disp: 15 mL, Rfl: 2  No Known Allergies  I personally reviewed active problem list, medication list, allergies, health maintenance, notes from last encounter, lab results with the patient/caregiver today.  ROS  Constitutional: Negative for fever or weight change.  Respiratory: See HPI   Cardiovascular: Negative for chest pain or palpitations.  Gastrointestinal: Negative for abdominal pain, no bowel changes.  Musculoskeletal: Negative for gait problem or joint swelling.  Skin: Negative for rash.  Neurological: Negative for dizziness or headache.  No other specific complaints in a complete review of systems (except as listed in HPI above).    Objective  Virtual encounter, vitals not obtained.  There is no height or weight on file to calculate BMI.  Physical Exam  Pulmonary/Chest: Effort normal. No respiratory distress. Speaking in complete sentences Neurological: Pt is alert and oriented to person, place, and time. Coordination, speech and gait are normal.  Psychiatric: Patient has a normal  mood and affect. behavior is normal. Judgment and thought content normal.   No results found for this or any previous visit (from the past 72 hour(s)).  PHQ2/9: Depression screen [facility] 2/9 05/29/2019 05/26/2019 02/16/2019 11/14/2018 09/22/2018  Decreased Interest 0 0 0 1 0  Down, Depressed, Hopeless 0 0 0 1 2  PHQ - 2 Score 0 0 0 2 2  Altered sleeping 0 0 1 3 2   Tired, decreased energy 0 0 0 1 0  Change in appetite 0 0 0 0 0  Feeling bad or failure about yourself  0 0 0 1 0  Trouble concentrating 0 0 0 0 0  Moving slowly or fidgety/restless 0 0 0 0 0  Suicidal thoughts 0 0 0 0 0  PHQ-9 Score 0 0 1 7 4   Difficult doing work/chores Not difficult at all Not difficult at all Not difficult at all Somewhat difficult -  Some recent data might be hidden   PHQ-2/9 Result is negative.    Fall Risk: Fall Risk  05/29/2019 05/26/2019 02/16/2019 11/14/2018 09/22/2018  Falls in the past year? 0 0 0 1 1  Number falls in past yr: 0 0 0 - 1  Comment - - - - 1 fall onto grass while helping to move a couch with his brother   Injury with Fall? 0 0 0 0 0  Risk for fall due to : - - - History of fall(s) -  Follow up Falls evaluation completed Falls evaluation completed Falls evaluation completed Falls evaluation completed Falls evaluation completed;Falls prevention discussed    Assessment & Plan  1. Grief 2. Anxiety 3. Bipolar 1 disorder, manic, moderate ([place]) - Seeing psychiatry, doing well overall, preparing for the holidays, trying to avoid feeling isolated with keeping up on the phone with friends and family.  Compliant with medications  4.  Diabetes mellitus type 2 in obese [facility]) - Needs to come for labs as soon as he is able. - Ambulatory referral to Ophthalmology  5. Parkinson disease ([place]) - Stable  6. Fibromyalgia - Increase lyrica - pregabalin (LYRICA) 50 MG capsule; Take 1 capsule (50 mg total) by mouth 2 (two) times daily. TAKE ONE CAPSULE BY MOUTH AT BEDTIME  Dispense: 180 capsule; Refill: 1  7. Essential hypertension - Stable - needs BP check in 2 weeks when able to come into office - quinapril-hydrochlorothiazide (ACCURETIC) 20-12.5 MG tablet; Take 1 tablet by mouth daily.  Dispense: 30 tablet; Refill: 3  8. Gastroesophageal reflux disease without esophagitis - Stable on diet only  9. Vitamin D deficiency - VITAMIN D 25 Hydroxy (Vit-D Deficiency, Fractures)  10. Pure hypercholesterolemia - Due for labs  11. Obstructive sleep apnea - Compliant with CPAP  12. Morbid obesity ([place]) - Discussed importance of 150 minutes of physical activity weekly, eat two servings of fish weekly, eat one serving of tree nuts ( cashews, pistachios, pecans, almonds.Marland Kitchen) every other day, eat 6 servings of fruit/vegetables daily and drink plenty of water and avoid sweet beverages.   13. Viral upper respiratory tract infection - Novel Coronavirus, NAA ([facility]) - fluticasone (FLONASE) 50 MCG/ACT nasal spray; Place 2 sprays into both nostrils daily.  Dispense: 16 g; Refill: 6 - loratadine (CLARITIN) 10 MG tablet; Take 1 tablet (10 mg total) by mouth daily.  Dispense: 20 tablet; Refill: 0  14. Diabetes mellitus with coincident hypertension ([place]) - quinapril-hydrochlorothiazide (ACCURETIC) 20-12.5 MG tablet; Take 1 tablet by mouth daily.  Dispense: 30 tablet; Refill: 3 - Insulin Degludec-Liraglutide (XULTOPHY) 100-3.6  UNIT-MG/ML SOPN; Inject 50 Units into the skin daily.  Dispense: 15 mL; Refill: 2 - Empagliflozin-metFORMIN HCl (SYNJARDY) 12.02-999 MG TABS; Take 1 tablet by mouth 2 (two) times daily at 10 AM and 5 PM.  Dispense: 60  tablet; Refill: 3  15. Encounter for colorectal cancer screening - Ambulatory referral to Gastroenterology  I discussed the assessment and treatment plan with the patient. The patient was provided an opportunity to ask questions and all were answered. The patient agreed with the plan and demonstrated an understanding of the instructions.   The patient was advised to call back or seek an in-person evaluation if the symptoms worsen or if the condition fails to improve as anticipated.  I provided 25 minutes of non-face-to-face time during this encounter.  Hubbard Hartshorn, FNP

## 2019-08-30 ENCOUNTER — Other Ambulatory Visit: Payer: Self-pay

## 2019-08-30 ENCOUNTER — Telehealth: Payer: Self-pay | Admitting: Family Medicine

## 2019-08-30 DIAGNOSIS — Z20822 Contact with and (suspected) exposure to covid-19: Secondary | ICD-10-CM

## 2019-08-30 NOTE — Telephone Encounter (Signed)
Called in for bid

## 2019-08-30 NOTE — Telephone Encounter (Signed)
Pharm needs directions  On pregabalin they received two set of directions

## 2019-09-01 LAB — NOVEL CORONAVIRUS, NAA: SARS-CoV-2, NAA: NOT DETECTED

## 2019-09-11 ENCOUNTER — Telehealth: Payer: Self-pay | Admitting: Family Medicine

## 2019-09-11 NOTE — Telephone Encounter (Signed)
Patient notified

## 2019-09-11 NOTE — Telephone Encounter (Addendum)
Please call patient to come in for labs.

## 2019-09-12 ENCOUNTER — Other Ambulatory Visit: Payer: Self-pay

## 2019-09-12 ENCOUNTER — Ambulatory Visit: Payer: PPO | Admitting: Emergency Medicine

## 2019-09-12 VITALS — BP 122/84 | HR 86

## 2019-09-12 DIAGNOSIS — I10 Essential (primary) hypertension: Secondary | ICD-10-CM | POA: Diagnosis not present

## 2019-09-12 DIAGNOSIS — E1169 Type 2 diabetes mellitus with other specified complication: Secondary | ICD-10-CM | POA: Diagnosis not present

## 2019-09-12 DIAGNOSIS — E669 Obesity, unspecified: Secondary | ICD-10-CM | POA: Diagnosis not present

## 2019-09-12 DIAGNOSIS — E7800 Pure hypercholesterolemia, unspecified: Secondary | ICD-10-CM | POA: Diagnosis not present

## 2019-09-13 LAB — COMPLETE METABOLIC PANEL WITH GFR
AG Ratio: 2 (calc) (ref 1.0–2.5)
ALT: 22 U/L (ref 9–46)
AST: 16 U/L (ref 10–35)
Albumin: 4.5 g/dL (ref 3.6–5.1)
Alkaline phosphatase (APISO): 71 U/L (ref 35–144)
BUN: 18 mg/dL (ref 7–25)
CO2: 27 mmol/L (ref 20–32)
Calcium: 9.5 mg/dL (ref 8.6–10.3)
Chloride: 99 mmol/L (ref 98–110)
Creat: 0.95 mg/dL (ref 0.70–1.33)
GFR, Est African American: 102 mL/min/{1.73_m2} (ref >=60)
GFR, Est Non African American: 88 mL/min/{1.73_m2} (ref >=60)
Globulin: 2.2 g/dL (calc) (ref 1.9–3.7)
Glucose, Bld: 319 mg/dL — ABNORMAL HIGH (ref 65–99)
Potassium: 4.9 mmol/L (ref 3.5–5.3)
Sodium: 138 mmol/L (ref 135–146)
Total Bilirubin: 0.3 mg/dL (ref 0.2–1.2)
Total Protein: 6.7 g/dL (ref 6.1–8.1)

## 2019-09-13 LAB — MICROALBUMIN / CREATININE URINE RATIO
Creatinine, Urine: 33 mg/dL (ref 20–320)
Microalb Creat Ratio: 36 mcg/mg creat — ABNORMAL HIGH (ref <30)
Microalb, Ur: 1.2 mg/dL

## 2019-09-13 LAB — LIPID PANEL
Cholesterol: 280 mg/dL — ABNORMAL HIGH (ref <200)
HDL: 50 mg/dL (ref >=40)
LDL Cholesterol (Calc): 167 mg/dL (calc) — ABNORMAL HIGH
Non-HDL Cholesterol (Calc): 230 mg/dL (calc) — ABNORMAL HIGH (ref <130)
Total CHOL/HDL Ratio: 5.6 (calc) — ABNORMAL HIGH (ref <5.0)
Triglycerides: 374 mg/dL — ABNORMAL HIGH (ref <150)

## 2019-09-13 LAB — HEMOGLOBIN A1C
Hgb A1c MFr Bld: 10 % of total Hgb — ABNORMAL HIGH (ref <5.7)
Mean Plasma Glucose: 240 (calc)
eAG (mmol/L): 13.3 (calc)

## 2019-09-13 LAB — VITAMIN D 25 HYDROXY (VIT D DEFICIENCY, FRACTURES): Vit D, 25-Hydroxy: 10 ng/mL — ABNORMAL LOW (ref 30–100)

## 2019-09-14 ENCOUNTER — Other Ambulatory Visit: Payer: Self-pay | Admitting: Family Medicine

## 2019-09-14 DIAGNOSIS — E559 Vitamin D deficiency, unspecified: Secondary | ICD-10-CM

## 2019-09-14 DIAGNOSIS — E782 Mixed hyperlipidemia: Secondary | ICD-10-CM

## 2019-09-14 DIAGNOSIS — E669 Obesity, unspecified: Secondary | ICD-10-CM

## 2019-09-14 MED ORDER — VITAMIN D (ERGOCALCIFEROL) 1.25 MG (50000 UNIT) PO CAPS: 50000.0000 [IU] | ORAL_CAPSULE | ORAL | 0 refills | Status: DC

## 2019-09-19 ENCOUNTER — Encounter: Payer: Self-pay | Admitting: *Deleted

## 2019-09-26 ENCOUNTER — Ambulatory Visit: Payer: PPO

## 2019-10-12 ENCOUNTER — Other Ambulatory Visit: Payer: Self-pay

## 2019-10-12 ENCOUNTER — Ambulatory Visit (INDEPENDENT_AMBULATORY_CARE_PROVIDER_SITE_OTHER): Payer: PPO

## 2019-10-12 VITALS — BP 146/92 | HR 95 | Temp 97.1°F | Resp 16 | Ht 71.0 in | Wt 221.0 lb

## 2019-10-12 DIAGNOSIS — Z23 Encounter for immunization: Secondary | ICD-10-CM

## 2019-10-12 DIAGNOSIS — Z0000 Encounter for general adult medical examination without abnormal findings: Secondary | ICD-10-CM

## 2019-10-12 NOTE — Patient Instructions (Signed)
Mr. Bryan Flowers , Thank you for taking time to come for your Medicare Wellness Visit. I appreciate your ongoing commitment to your health goals. Please review the following plan we discussed and let me know if I can assist you in the future.   Screening recommendations/referrals: Colonoscopy: done 06/01/16. Please call [facility] to schedule your screening colonoscopy.  Recommended yearly ophthalmology/optometry visit for glaucoma screening and checkup Recommended yearly dental visit for hygiene and checkup  Vaccinations: Influenza vaccine: done today Pneumococcal vaccine: done 09/08/17 Tdap vaccine: done 04/23/11 Shingles vaccine: Shingrix discussed. Please contact your pharmacy for coverage information.   Conditions/risks identified: Keep up the great work!  Next appointment: Please follow up in one year for your Medicare Annual Wellness visit.    Preventive Care 40-64 Years, Male Preventive care refers to lifestyle choices and visits with your health care provider that can promote health and wellness. What does preventive care include?  A yearly physical exam. This is also called an annual well check.  Dental exams once or twice a year.  Routine eye exams. Ask your health care provider how often you should have your eyes checked.  Personal lifestyle choices, including:  Daily care of your teeth and gums.  Regular physical activity.  Eating a healthy diet.  Avoiding tobacco and drug use.  Limiting alcohol use.  Practicing safe sex.  Taking low-dose aspirin every day starting at age 70. What happens during an annual well check? The services and screenings done by your health care provider during your annual well check will depend on your age, overall health, lifestyle risk factors, and family history of disease. Counseling  Your health care provider may ask you questions about your:  Alcohol use.  Tobacco use.  Drug use.  Emotional well-being.  Home and  relationship well-being.  Sexual activity.  Eating habits.  Work and work Statistician. Screening  You may have the following tests or measurements:  Height, weight, and BMI.  Blood pressure.  Lipid and cholesterol levels. These may be checked every 5 years, or more frequently if you are over 75 years old.  Skin check.  Lung cancer screening. You may have this screening every year starting at age 67 if you have a 30-pack-year history of smoking and currently smoke or have quit within the past 15 years.  Fecal occult blood test (FOBT) of the stool. You may have this test every year starting at age 67.  Flexible sigmoidoscopy or colonoscopy. You may have a sigmoidoscopy every 5 years or a colonoscopy every 10 years starting at age 78.  Prostate cancer screening. Recommendations will vary depending on your family history and other risks.  Hepatitis C blood test.  Hepatitis B blood test.  Sexually transmitted disease (STD) testing.  Diabetes screening. This is done by checking your blood sugar (glucose) after you have not eaten for a while (fasting). You may have this done every 1-3 years. Discuss your test results, treatment options, and if necessary, the need for more tests with your health care provider. Vaccines  Your health care provider may recommend certain vaccines, such as:  Influenza vaccine. This is recommended every year.  Tetanus, diphtheria, and acellular pertussis (Tdap, Td) vaccine. You may need a Td booster every 10 years.  Zoster vaccine. You may need this after age 33.  Pneumococcal 13-valent conjugate (PCV13) vaccine. You may need this if you have certain conditions and have not been vaccinated.  Pneumococcal polysaccharide (PPSV23) vaccine. You may need one or two doses if  you smoke cigarettes or if you have certain conditions. Talk to your health care provider about which screenings and vaccines you need and how often you need them. This information is  not intended to replace advice given to you by your health care provider. Make sure you discuss any questions you have with your health care provider. Document Released: 10/18/2015 Document Revised: 06/10/2016 Document Reviewed: 07/23/2015 Elsevier Interactive Patient Education  2017 [place] Prevention in the Home Falls can cause injuries. They can happen to people of all ages. There are many things you can do to make your home safe and to help prevent falls. What can I do on the outside of my home?  Regularly fix the edges of walkways and driveways and fix any cracks.  Remove anything that might make you trip as you walk through a door, such as a raised step or threshold.  Trim any bushes or trees on the path to your home.  Use bright outdoor lighting.  Clear any walking paths of anything that might make someone trip, such as rocks or tools.  Regularly check to see if handrails are loose or broken. Make sure that both sides of any steps have handrails.  Any raised decks and porches should have guardrails on the edges.  Have any leaves, snow, or ice cleared regularly.  Use sand or salt on walking paths during winter.  Clean up any spills in your garage right away. This includes oil or grease spills. What can I do in the bathroom?  Use night lights.  Install grab bars by the toilet and in the tub and shower. Do not use towel bars as grab bars.  Use non-skid mats or decals in the tub or shower.  If you need to sit down in the shower, use a plastic, non-slip stool.  Keep the floor dry. Clean up any water that spills on the floor as soon as it happens.  Remove soap buildup in the tub or shower regularly.  Attach bath mats securely with double-sided non-slip rug tape.  Do not have throw rugs and other things on the floor that can make you trip. What can I do in the bedroom?  Use night lights.  Make sure that you have a light by your bed that is easy to  reach.  Do not use any sheets or blankets that are too big for your bed. They should not hang down onto the floor.  Have a firm chair that has side arms. You can use this for support while you get dressed.  Do not have throw rugs and other things on the floor that can make you trip. What can I do in the kitchen?  Clean up any spills right away.  Avoid walking on wet floors.  Keep items that you use a lot in easy-to-reach places.  If you need to reach something above you, use a strong step stool that has a grab bar.  Keep electrical cords out of the way.  Do not use floor polish or wax that makes floors slippery. If you must use wax, use non-skid floor wax.  Do not have throw rugs and other things on the floor that can make you trip. What can I do with my stairs?  Do not leave any items on the stairs.  Make sure that there are handrails on both sides of the stairs and use them. Fix handrails that are broken or loose. Make sure that handrails are as long as  the stairways.  Check any carpeting to make sure that it is firmly attached to the stairs. Fix any carpet that is loose or worn.  Avoid having throw rugs at the top or bottom of the stairs. If you do have throw rugs, attach them to the floor with carpet tape.  Make sure that you have a light switch at the top of the stairs and the bottom of the stairs. If you do not have them, ask someone to add them for you. What else can I do to help prevent falls?  Wear shoes that:  Do not have high heels.  Have rubber bottoms.  Are comfortable and fit you well.  Are closed at the toe. Do not wear sandals.  If you use a stepladder:  Make sure that it is fully opened. Do not climb a closed stepladder.  Make sure that both sides of the stepladder are locked into place.  Ask someone to hold it for you, if possible.  Clearly mark and make sure that you can see:  Any grab bars or handrails.  First and last steps.  Where the  edge of each step is.  Use tools that help you move around (mobility aids) if they are needed. These include:  Canes.  Walkers.  Scooters.  Crutches.  Turn on the lights when you go into a dark area. Replace any light bulbs as soon as they burn out.  Set up your furniture so you have a clear path. Avoid moving your furniture around.  If any of your floors are uneven, fix them.  If there are any pets around you, be aware of where they are.  Review your medicines with your doctor. Some medicines can make you feel dizzy. This can increase your chance of falling. Ask your doctor what other things that you can do to help prevent falls. This information is not intended to replace advice given to you by your health care provider. Make sure you discuss any questions you have with your health care provider. Document Released: 07/18/2009 Document Revised: 02/27/2016 Document Reviewed: 10/26/2014 Elsevier Interactive Patient Education  2017 [employer].

## 2019-10-12 NOTE — Progress Notes (Signed)
Subjective:   Bryan Flowers is a 59 y.o. male who presents for Medicare Annual/Subsequent preventive examination.  Review of Systems:   Cardiac Risk Factors include: advanced age (>56men, >26 women);diabetes mellitus;dyslipidemia;male gender;hypertension;obesity (BMI >30kg/m2)     Objective:    Vitals: BP (!) 146/92 (BP Location: Left Arm, Patient Position: Sitting, Cuff Size: Normal)   Pulse 95   Temp (!) 97.1 F (36.2 C) (Temporal)   Resp 16   Ht 5\' 11"  (1.803 m)   Wt 221 lb (100.2 kg)   SpO2 99%   BMI 30.82 kg/m   Body mass index is 30.82 kg/m.  Advanced Directives 10/12/2019 09/22/2018 09/08/2017 07/06/2017 06/17/2017 06/09/2017 04/05/2017  Does Patient Have a Medical Advance Directive? Yes Yes Yes No No No Yes  Type of Paramedic of [place];Living will [place];Living will [place]  Does patient want to make changes to medical advance directive? - (No Data) - - - - -  Copy of [place] in Chart? Yes - validated most recent copy scanned in chart (See row information) - Yes - - - -  Would patient like information on creating a medical advance directive? - - - - - - -    Tobacco Social History   Tobacco Use  Smoking Status Never Smoker  Smokeless Tobacco Never Used  Tobacco Comment   Non-smoker     Counseling given: Not Answered Comment: Non-smoker   Clinical Intake:  Pre-visit preparation completed: Yes  Pain : No/denies pain     BMI - recorded: 30.82 Nutritional Status: BMI > 30  Obese Nutritional Risks: None Diabetes: Yes CBG done?: No Did pt. bring in CBG monitor from home?: No   Nutrition Risk Assessment:  Has the patient had any N/V/D within the last 2 months?  No  Does the patient have any non-healing wounds?  No  Has the patient had any unintentional weight loss or weight gain?  No   Diabetes:  Is the patient diabetic?  Yes    If diabetic, was a CBG obtained today?  No  Did the patient bring in their glucometer from home?  No  How often do you monitor your CBG's? Daily fasting in AM.   Financial Strains and Diabetes Management:  Are you having any financial strains with the device, your supplies or your medication? No .  Does the patient want to be seen by Chronic Care Management for management of their diabetes?  No  Would the patient like to be referred to a Nutritionist or for Diabetic Management?  No   Diabetic Exams:  Diabetic Eye Exam: Completed 05/06/17. Overdue for diabetic eye exam. Pt has been advised about the importance in completing this exam. Pt's appt postponed due to Covid.   Diabetic Foot Exam: Completed 11/14/18.   How often do you need to have someone help you when you read instructions, pamphlets, or other written materials from your doctor or pharmacy?: 1 - Never  Interpreter Needed?: No  Information entered by :: Clemetine Marker LPN  Past Medical History:  Diagnosis Date  . Anemia    iron deficiency  . Anxiety   . Bipolar 1 disorder, manic, moderate ([place])   . Claudication ([place]) 07/16/2017  . Depression   . Diabetes mellitus without complication ([place])   . GERD (gastroesophageal reflux disease)   . Hyperlipidemia   . Hypertension   . Insomnia   .  Parkinson disease ([place])   . Peptic ulcer disease with hemorrhage   . Sleep apnea    Past Surgical History:  Procedure Laterality Date  . ESOPHAGOGASTRODUODENOSCOPY Left 03/16/2016   Procedure: ESOPHAGOGASTRODUODENOSCOPY (EGD);  Surgeon: Manus Gunning, MD;  Location: [facility];  Service: Gastroenterology;  Laterality: Left;  . FOOT FRACTURE SURGERY Right    Family History  Problem Relation Age of Onset  . Cancer Mother   . Heart disease Father   . Arthritis Brother    Social History   Socioeconomic History  . Marital status: Widowed    Spouse name: Not on file  . Number of children: 0  . Years of education: Not on  file  . Highest education level: High school graduate  Occupational History    Employer: DISABLED  Tobacco Use  . Smoking status: Never Smoker  . Smokeless tobacco: Never Used  . Tobacco comment: Non-smoker  Substance and Sexual Activity  . Alcohol use: No    Alcohol/week: 0.0 standard drinks  . Drug use: No  . Sexual activity: Not Currently    Partners: Female  Other Topics Concern  . Not on file  Social History Narrative  . Not on file   Social Determinants of Health   Financial Resource Strain: Low Risk   . Difficulty of Paying Living Expenses: Not hard at all  Food Insecurity: No Food Insecurity  . Worried About Charity fundraiser in the Last Year: Never true  . Ran Out of Food in the Last Year: Never true  Transportation Needs: No Transportation Needs  . Lack of Transportation (Medical): No  . Lack of Transportation (Non-Medical): No  Physical Activity: Sufficiently Active  . Days of Exercise per Week: 7 days  . Minutes of Exercise per Session: 30 min  Stress: No Stress Concern Present  . Feeling of Stress : Only a little  Social Connections: Slightly Isolated  . Frequency of Communication with Friends and Family: More than three times a week  . Frequency of Social Gatherings with Friends and Family: More than three times a week  . Attends Religious Services: More than 4 times per year  . Active Member of Clubs or Organizations: Yes  . Attends Archivist Meetings: More than 4 times per year  . Marital Status: Widowed    Outpatient Encounter Medications as of 10/12/2019  Medication Sig  . acetaminophen (TYLENOL) 500 MG tablet Take 1,000 mg by mouth every 6 (six) hours as needed (pain).  . Armodafinil 250 MG tablet Take by mouth.  Marland Kitchen atorvastatin (LIPITOR) 20 MG tablet Take 1 tablet (20 mg total) by mouth daily.  Marland Kitchen b complex vitamins capsule Take 1 capsule by mouth daily.  . carbidopa-levodopa (SINEMET CR) 50-200 MG per tablet Take 1 tablet by mouth 3  (three) times daily.   . cholecalciferol (VITAMIN D3) 25 MCG (1000 UT) tablet Take 1,000 Units by mouth daily.  . DULoxetine (CYMBALTA) 60 MG capsule Take 120 mg by mouth at bedtime.  . Empagliflozin-metFORMIN HCl (SYNJARDY) 12.02-999 MG TABS Take 1 tablet by mouth 2 (two) times daily at 10 AM and 5 PM.  . fluticasone (FLONASE) 50 MCG/ACT nasal spray Place 2 sprays into both nostrils daily.  Marland Kitchen glucose blood test strip Check once daily fasting, and up to 3 times a day as needed.  . Insulin Degludec-Liraglutide (XULTOPHY) 100-3.6 UNIT-MG/ML SOPN Inject 50 Units into the skin daily.  . Insulin Pen Needle (NOVOFINE) 32G X 6 MM MISC 1 each  by Does not apply route daily.  Marland Kitchen lamoTRIgine (LAMICTAL) 200 MG tablet Take 200 mg by mouth at bedtime.  Marland Kitchen loratadine (CLARITIN) 10 MG tablet Take 1 tablet (10 mg total) by mouth daily.  Marland Kitchen LORazepam (ATIVAN) 1 MG tablet TK 1 T PO TID PRN  . pregabalin (LYRICA) 50 MG capsule Take 1 capsule (50 mg total) by mouth 2 (two) times daily. TAKE ONE CAPSULE BY MOUTH AT BEDTIME  . propranolol ER (INDERAL LA) 60 MG 24 hr capsule Take 60 mg by mouth at bedtime.   Marland Kitchen QUEtiapine (SEROQUEL XR) 400 MG 24 hr tablet TK 1 T PO QD AT 8PM ONE HOUR BEFORE OR AFTER A MEAL  . quinapril-hydrochlorothiazide (ACCURETIC) 20-12.5 MG tablet Take 1 tablet by mouth daily.  Marland Kitchen rOPINIRole (REQUIP) 3 MG tablet Take by mouth.  . Vitamin D, Ergocalciferol, (DRISDOL) 1.25 MG (50000 UT) CAPS capsule Take 1 capsule (50,000 Units total) by mouth every 7 (seven) days. Then switch to 1000IU once daily OTC  . [DISCONTINUED] ondansetron (ZOFRAN) 4 MG tablet Take 1 tablet (4 mg total) by mouth every 8 (eight) hours as needed for nausea or vomiting.   No facility-administered encounter medications on file as of 10/12/2019.    Activities of Daily Living In your present state of health, do you have any difficulty performing the following activities: 10/12/2019  Hearing? Y  Comment wears hearing aids  Vision? N    Difficulty concentrating or making decisions? N  Walking or climbing stairs? N  Dressing or bathing? N  Doing errands, shopping? N  Preparing Food and eating ? N  Using the Toilet? N  In the past six months, have you accidently leaked urine? N  Do you have problems with loss of bowel control? N  Managing your Medications? N  Managing your Finances? N  Housekeeping or managing your Housekeeping? N  Some recent data might be hidden    Patient Care Team: Hubbard Hartshorn, FNP as PCP - General (Family Medicine) Madelyn Brunner, MD as Consulting Physician (Neurology) [name], NP as Consulting Physician Kary Kos Blair Heys, [facility] as Pharmacist (Pharmacist)   Assessment:   This is a routine wellness examination for [place].  Exercise Activities and Dietary recommendations Current Exercise Habits: Home exercise routine, Type of exercise: walking, Time (Minutes): 30, Frequency (Times/Week): 7, Weekly Exercise (Minutes/Week): 210, Intensity: Mild, Exercise limited by: neurologic condition(s)  Goals    . Increase physical activity     Pt wants to increase physical activity and plans to start by walking more.        Fall Risk Fall Risk  10/12/2019 08/29/2019 05/29/2019 05/26/2019 02/16/2019  Falls in the past year? 0 0 0 0 0  Number falls in past yr: 0 0 0 0 0  Comment - - - - -  Injury with Fall? 0 0 0 0 0  Risk for fall due to : No Fall Risks - - - -  Follow up Falls prevention discussed Falls evaluation completed Falls evaluation completed Falls evaluation completed Falls evaluation completed   [place]:  Any stairs in or around the home? No  If so, do they handrails? No   Home free of loose throw rugs in walkways, pet beds, electrical cords, etc? Yes  Adequate lighting in your home to reduce risk of falls? Yes   ASSISTIVE DEVICES UTILIZED TO PREVENT FALLS:  Life alert? No  Use of a cane, walker or w/c? No  Grab bars  in the bathroom? Yes   Shower chair or bench in shower? No  Elevated toilet seat or a handicapped toilet? Yes   DME ORDERS:  DME order needed?  No   TIMED UP AND GO:  Was the test performed? Yes .  Length of time to ambulate 10 feet: 5 sec.   GAIT:  Appearance of gait: Gait stead-fast and without the use of an assistive device.   Education: Fall risk prevention has been discussed.  Intervention(s) required? No    Depression Screen PHQ 2/9 Scores 10/12/2019 08/29/2019 05/29/2019 05/26/2019  PHQ - 2 Score 0 0 0 0  PHQ- 9 Score - 0 0 0    Cognitive Function     6CIT Screen 10/12/2019 09/08/2017  What Year? 0 points 0 points  What month? 0 points 0 points  What time? 0 points 3 points  Count back from 20 0 points 0 points  Months in reverse 2 points 0 points  Repeat phrase 0 points 0 points  Total Score 2 3    Immunization History  Administered Date(s) Administered  . Influenza, Seasonal, Injecte, Preservative Fre 06/19/2010, 07/07/2012  . Influenza,inj,Quad PF,6+ Mos 08/02/2013, 07/10/2014, 11/02/2016, 07/06/2017, 07/14/2018, 10/12/2019  . Pneumococcal Polysaccharide-23 07/07/2012, 09/08/2017  . Tdap 04/23/2011    Qualifies for Shingles Vaccine? Yes  . Due for Shingrix. Education has been provided regarding the importance of this vaccine. Pt has been advised to call insurance company to determine out of pocket expense. Advised may also receive vaccine at local pharmacy or Health Dept. Verbalized acceptance and understanding.  Tdap: Up to date  Flu Vaccine: Due for Flu vaccine. Does the patient want to receive this vaccine today?  Yes . Education has been provided regarding the importance of this vaccine but still declined. Advised may receive this vaccine at local pharmacy or Health Dept. Aware to provide a copy of the vaccination record if obtained from local pharmacy or Health Dept. Verbalized acceptance and understanding.  Pneumococcal Vaccine: Up to date. Due for PCV 13 at age  76.  Screening Tests Health Maintenance  Topic Date Due  . OPHTHALMOLOGY EXAM  05/06/2018  . COLONOSCOPY  06/02/2019  . FOOT EXAM  11/15/2019  . HEMOGLOBIN A1C  03/12/2020  . TETANUS/TDAP  04/22/2021  . INFLUENZA VACCINE  Completed  . PNEUMOCOCCAL POLYSACCHARIDE VACCINE AGE 29-64 HIGH RISK  Completed  . Hepatitis C Screening  Completed  . HIV Screening  Completed   Cancer Screenings:  Colorectal Screening: Completed 06/01/16. Repeat every 3 years; Pt states he received letter from [facility] and needs to call and schedule appt.   Lung Cancer Screening: (Low Dose CT Chest recommended if Age 6-80 years, 30 pack-year currently smoking OR have quit w/in 15years.) does not qualify.   Additional Screening:  Hepatitis C Screening: does qualify; Completed 09/08/17  Vision Screening: Recommended annual ophthalmology exams for early detection of glaucoma and other disorders of the eye. Is the patient up to date with their annual eye exam?  No  - postponed due to Covid Who is the provider or what is the name of the office in which the pt attends annual eye exams? Dr. Marvel [facility]  Dental Screening: Recommended annual dental exams for proper oral hygiene  Community Resource Referral:  CRR required this visit?  No       Plan:    I have personally reviewed and addressed the Medicare Annual Wellness questionnaire and have noted the following in the patient's chart:  A. Medical and social  history B. Use of alcohol, tobacco or illicit drugs  C. Current medications and supplements D. Functional ability and status E.  Nutritional status F.  Physical activity G. Advance directives H. List of other physicians I.  Hospitalizations, surgeries, and ER visits in previous 12 months J.  [place] such as hearing and vision if needed, cognitive and depression L. Referrals and appointments   In addition, I have reviewed and discussed with patient certain preventive protocols, quality  metrics, and best practice recommendations. A written personalized care plan for preventive services as well as general preventive health recommendations were provided to patient.   Signed,  Clemetine Marker, LPN Nurse Health Advisor   Nurse Notes: pt's blood pressure slightly elevated at start of visit 146/92. At recheck after flu vaccine 160/100 K[facility] and 172/100 Cathrine Muster CMA. Pt denies chest pain or blurred vision but does state he has been waking up with headaches at times, taking tylenol with relief but he did not sleep well last night due to stress from watching politics until 12:30 am. Pt advised he will be contacted tomorrow to come to office for blood pressure check or go to urgent care or ER if sxs worsen or persist. Pt aware and voiced understanding that he may need additional office visit prior to visit scheduled with [name] PAC on 11/29/19.

## 2019-10-13 ENCOUNTER — Telehealth: Payer: Self-pay | Admitting: Family Medicine

## 2019-10-13 NOTE — Telephone Encounter (Signed)
BP elevated yesterday during AWV with Clemetine Marker LPN at [phone].  Please advise he maintain current medications, limit sodium to <2514mcg/day, drink plenty of water, and return on Monday for BP check.  Review signs and symptoms of stroke in detail with him - sudden onset severe headache, slurred speech, confusion, weakness, numbness/tingling, facial droop - if any of these should occur, or if he has chest pain or shortness of breath, he needs to call 911 and go to the ER.

## 2019-10-13 NOTE — Telephone Encounter (Signed)
Pt.notified

## 2019-10-16 ENCOUNTER — Encounter: Payer: Self-pay | Admitting: Family Medicine

## 2019-10-16 ENCOUNTER — Other Ambulatory Visit: Payer: Self-pay

## 2019-10-16 ENCOUNTER — Ambulatory Visit: Payer: PPO

## 2019-10-16 ENCOUNTER — Ambulatory Visit (INDEPENDENT_AMBULATORY_CARE_PROVIDER_SITE_OTHER): Payer: PPO | Admitting: Family Medicine

## 2019-10-16 VITALS — BP 148/88 | HR 100 | Temp 97.1°F | Resp 14 | Ht 70.0 in | Wt 221.0 lb

## 2019-10-16 VITALS — BP 146/88 | HR 100

## 2019-10-16 DIAGNOSIS — J069 Acute upper respiratory infection, unspecified: Secondary | ICD-10-CM | POA: Diagnosis not present

## 2019-10-16 DIAGNOSIS — R5383 Other fatigue: Secondary | ICD-10-CM | POA: Diagnosis not present

## 2019-10-16 DIAGNOSIS — I10 Essential (primary) hypertension: Secondary | ICD-10-CM | POA: Diagnosis not present

## 2019-10-16 DIAGNOSIS — J310 Chronic rhinitis: Secondary | ICD-10-CM

## 2019-10-16 DIAGNOSIS — R251 Tremor, unspecified: Secondary | ICD-10-CM | POA: Diagnosis not present

## 2019-10-16 DIAGNOSIS — J329 Chronic sinusitis, unspecified: Secondary | ICD-10-CM

## 2019-10-16 DIAGNOSIS — R450 Nervousness: Secondary | ICD-10-CM

## 2019-10-16 DIAGNOSIS — R2689 Other abnormalities of gait and mobility: Secondary | ICD-10-CM

## 2019-10-16 MED ORDER — AMLODIPINE BESYLATE 5 MG PO TABS: 5.0000 mg | ORAL_TABLET | Freq: Every day | ORAL | 3 refills | Status: DC

## 2019-10-16 NOTE — Progress Notes (Signed)
Name: Bryan Flowers   MRN: [id number]    DOB: Apr 04, 1961   Date:10/16/2019       Progress Note  Subjective:    Chief Complaint  Chief Complaint  Patient presents with  . Hypertension  . Dizziness    I connected with  Bryan Flowers  on 10/16/19 at  2:20 PM EST by a video enabled telemedicine application and verified that I am speaking with the correct person using two identifiers.  I discussed the limitations of evaluation and management by telemedicine and the availability of in person appointments. The patient expressed understanding and agreed to proceed. Staff also discussed with the patient that there may be a patient responsible charge related to this service. Patient Location: home Provider Location: [facility]  Additional Individuals present: none  HPI PT presents with high BP and some other vague sx - gait feels worse than baseline, possibly some dizziness or balance issues, generally feels tired, slight HA. Pt had high BP over the past week since last OV on 10/12/2019 when he was here in office to see PCP, BP elevated at that time. BP Readings from Last 3 Encounters:  10/16/19 (!) 148/88  10/16/19 (!) 146/88  10/12/19 (!) 146/92  He is on quinapril-HCTZ and propranolol (mostly for tremors per neuro and pt)  He has had worse ambulation and shakiness.  More wobbly than normal  Morning blood sugars 140-150  He states he just feels more tired, he has parkinsons and gait is normally okay, but for the last 4 days he feels like he's going to fall, no falls or near falls yet.  Virtual encounter limits ability for me to see and assess pt.  He did come in for vitals this am, and is willing to come into office for labs and for me to see him and do physical exam.  Pt sees my colleague Raelyn Ensign NP as his PCP, I have never seen the patient before and do not know his baseline.    He denies vision changes, eye pain, photophobia, CP, SOB, LE edema exertional sx.  No N, V, D, change  in appetite, fever chills sweats abd pain, urinary sx - no dysuria, change in urine output.  No focal numbness or weakness, no facial droop, slurred speech, or confusion.    He states he is compliant with all his meds     Patient Active Problem List   Diagnosis Date Noted  . Morbid obesity ([place]) 02/16/2019  . Pure hypercholesterolemia 02/16/2019  . Grief 02/16/2019  . Major depression, recurrent ([place]) 03/14/2018  . Bipolar 1 disorder, manic, moderate ([place])   . Vitamin D deficiency 09/10/2017  . Gastroesophageal reflux disease without esophagitis 09/10/2017  . History of iron deficiency anemia 03/30/2016  . History of iron deficiency anemia 03/24/2016  . History of GI bleed 03/15/2016  . Hyponatremia 03/15/2016  . Parkinson disease ([place])   . Hypertension   . Anxiety 08/19/2015  . Abnormal ECG 04/09/2015  . Back ache 04/09/2015  . Diabetes mellitus type 2 in obese ([place]) 04/09/2015  . Obstructive sleep apnea 04/09/2015  . HDL deficiency 04/09/2015  . Hearing loss, sensorineural, combined types 04/09/2015  . HLD (hyperlipidemia) 04/09/2015  . Vocal cord atrophy 05/29/2014  . Fibromyalgia 03/15/2014  . Testicular hypofunction 05/17/2009  . Migraine with aura 05/14/2009    Social History   Tobacco Use  . Smoking status: Never Smoker  . Smokeless tobacco: Never Used  . Tobacco comment: Non-smoker  Substance Use  Topics  . Alcohol use: No    Alcohol/week: 0.0 standard drinks     Current Outpatient Medications:  .  acetaminophen (TYLENOL) 500 MG tablet, Take 1,000 mg by mouth every 6 (six) hours as needed (pain)., Disp: , Rfl:  .  Armodafinil 250 MG tablet, Take by mouth., Disp: , Rfl:  .  atorvastatin (LIPITOR) 20 MG tablet, Take 1 tablet (20 mg total) by mouth daily., Disp: 90 tablet, Rfl: 3 .  b complex vitamins capsule, Take 1 capsule by mouth daily., Disp: , Rfl:  .  carbidopa-levodopa (SINEMET CR) 50-200 MG per tablet, Take 1 tablet by mouth 3 (three) times daily. ,  Disp: , Rfl:  .  cholecalciferol (VITAMIN D3) 25 MCG (1000 UT) tablet, Take 1,000 Units by mouth daily., Disp: , Rfl:  .  DULoxetine (CYMBALTA) 60 MG capsule, Take 120 mg by mouth at bedtime., Disp: , Rfl:  .  Empagliflozin-metFORMIN HCl (SYNJARDY) 12.02-999 MG TABS, Take 1 tablet by mouth 2 (two) times daily at 10 AM and 5 PM., Disp: 60 tablet, Rfl: 3 .  fluticasone (FLONASE) 50 MCG/ACT nasal spray, Place 2 sprays into both nostrils daily., Disp: 16 g, Rfl: 6 .  glucose blood test strip, Check once daily fasting, and up to 3 times a day as needed., Disp: 100 each, Rfl: 12 .  Insulin Degludec-Liraglutide (XULTOPHY) 100-3.6 UNIT-MG/ML SOPN, Inject 50 Units into the skin daily., Disp: 15 mL, Rfl: 2 .  Insulin Pen Needle (NOVOFINE) 32G X 6 MM MISC, 1 each by Does not apply route daily., Disp: 100 each, Rfl: 3 .  lamoTRIgine (LAMICTAL) 200 MG tablet, Take 200 mg by mouth at bedtime., Disp: , Rfl:  .  loratadine (CLARITIN) 10 MG tablet, Take 1 tablet (10 mg total) by mouth daily., Disp: 20 tablet, Rfl: 0 .  LORazepam (ATIVAN) 1 MG tablet, TK 1 T PO TID PRN, Disp: , Rfl:  .  pregabalin (LYRICA) 50 MG capsule, Take 1 capsule (50 mg total) by mouth 2 (two) times daily. TAKE ONE CAPSULE BY MOUTH AT BEDTIME, Disp: 180 capsule, Rfl: 1 .  propranolol ER (INDERAL LA) 60 MG 24 hr capsule, Take 60 mg by mouth at bedtime. , Disp: , Rfl:  .  QUEtiapine (SEROQUEL XR) 400 MG 24 hr tablet, TK 1 T PO QD AT 8PM ONE HOUR BEFORE OR AFTER A MEAL, Disp: , Rfl: 2 .  quinapril-hydrochlorothiazide (ACCURETIC) 20-12.5 MG tablet, Take 1 tablet by mouth daily., Disp: 30 tablet, Rfl: 3 .  rOPINIRole (REQUIP) 3 MG tablet, Take by mouth., Disp: , Rfl:  .  Vitamin D, Ergocalciferol, (DRISDOL) 1.25 MG (50000 UT) CAPS capsule, Take 1 capsule (50,000 Units total) by mouth every 7 (seven) days. Then switch to 1000IU once daily OTC, Disp: 8 capsule, Rfl: 0  No Known Allergies I personally reviewed active problem list, medication list,  allergies, family history, social history, health maintenance, notes from last encounter, lab results, imaging with the patient/caregiver today. Review of Systems  Constitutional: Negative.   HENT: Negative.   Eyes: Negative.   Respiratory: Negative.   Cardiovascular: Negative.   Gastrointestinal: Negative.   Endocrine: Negative.   Genitourinary: Negative.   Musculoskeletal: Positive for gait problem.  Skin: Negative.   Allergic/Immunologic: Negative.   Hematological: Negative.   Psychiatric/Behavioral: Negative.   All other systems reviewed and are negative.    Objective:   Virtual encounter, vitals limited, only able to obtain the following Today's Vitals   10/16/19 1209  BP: (!) 148/88  Pulse: 100  Resp: 14  Temp: (!) 97.1 F (36.2 C)  SpO2: 95%  Weight: 221 lb (100.2 kg)  Height: 5\' 10"  (1.778 m)   Body mass index is 31.71 kg/m. Nursing Note and Vital Signs reviewed.  Physical Exam Vitals and nursing note reviewed.  Constitutional:      General: He is not in acute distress.    Appearance: Normal appearance. He is well-developed. He is obese. He is not ill-appearing, toxic-appearing or diaphoretic.  HENT:     Head: Normocephalic and atraumatic.     Right Ear: External ear normal.     Left Ear: External ear normal.     Nose: Nose normal.  Eyes:     General:        Right eye: No discharge.        Left eye: No discharge.     Conjunctiva/sclera: Conjunctivae normal.  Neck:     Trachea: No tracheal deviation.  Pulmonary:     Effort: Pulmonary effort is normal. No respiratory distress.     Breath sounds: No stridor.  Skin:    Coloration: Skin is not jaundiced or pale.     Findings: No rash.  Neurological:     Mental Status: He is alert.  Psychiatric:        Attention and Perception: Attention normal.        Mood and Affect: Mood normal.        Speech: Speech normal.        Behavior: Behavior normal. Behavior is cooperative.     PE limited by  telephone encounter Pt came in person the following day - see additional note for PE and additional HPI  No results found for this or any previous visit (from the past 72 hour(s)).  Assessment and Plan:     ICD-10-CM   1. Essential hypertension  I10 CMP w GFR    amLODipine (NORVASC) 5 MG tablet   unstable over the past week, compliant with meds, no concern for end organ damage, BP may be elevated secondary to illness? screen for causes  2. Jittery  R45.0 CMP w GFR    CBC w/ Diff    A1C    UA w/reflex microscopy, LAB    Mag   vague sx, overall he feels bad, worse at night and in the morning, asked him to come in person  3. Shakiness  R25.1 CMP w GFR    CBC w/ Diff    A1C    UA w/reflex microscopy, LAB    Mag   unclear etiology - screen labs, would like to do in person PE, r/o uti, no obvious OTC meds or new Rx to worsen baseline gait w/Parkinson's  4. Other abnormalities of gait and mobility  R26.89 CMP w GFR    UA w/reflex microscopy, LAB    Mag   change from baseline, but non-focal  5. Rhinosinusitis  J31.0 amoxicillin-clavulanate (AUGMENTIN) 875-125 MG tablet   J32.9 COVID   when pt came in day following virtual visit, PE revealed sinus ttp and erythematous nasal mucosa and posterior oropharynx  6. Upper respiratory tract infection, unspecified type  J06.9 COVID   URI, ttp to sinuses cover with abx for ABS, may be viral, pt asked to get COVID test done, close f/up  7. Fatigue, unspecified type  R53.83    pt on 10/17/2019 stated he's extremely fatigued and sleeping all day, did not state with visit on 10/16/2019     Plan to have  pt come in for labs and full set of VS - will start amlodipine at bedtime - con't to monitor HTN Recheck basic labs, r/o UTI May be some worsening parkinson sx?  Or other triggers of worsening gait/function/tremor etc.  Encourage neuro f/up..  -Red flags and when to present for emergency care or RTC including fever >101.74F, chest pain, shortness of  breath, new/worsening/un-resolving symptoms,  reviewed with patient at time of visit. Follow up and care instructions discussed and provided in AVS. - I discussed the assessment and treatment plan with the patient. The patient was provided an opportunity to ask questions and all were answered. The patient agreed with the plan and demonstrated an understanding of the instructions.  I provided 20 minutes of non-face-to-face time during this encounter.  [name], PA-C 10/16/19 2:21 PM

## 2019-10-16 NOTE — Progress Notes (Signed)
Patient had complaints of loss of steadiness, and some dizziness.  Reported to his nurse Bonnita Nasuti and she scheduled a virtual for this afternoon with tapia.

## 2019-10-17 DIAGNOSIS — R450 Nervousness: Secondary | ICD-10-CM | POA: Diagnosis not present

## 2019-10-17 DIAGNOSIS — R251 Tremor, unspecified: Secondary | ICD-10-CM | POA: Diagnosis not present

## 2019-10-17 DIAGNOSIS — I10 Essential (primary) hypertension: Secondary | ICD-10-CM | POA: Diagnosis not present

## 2019-10-17 DIAGNOSIS — R2689 Other abnormalities of gait and mobility: Secondary | ICD-10-CM | POA: Diagnosis not present

## 2019-10-17 MED ORDER — AMOXICILLIN-POT CLAVULANATE 875-125 MG PO TABS: 1.0000 | ORAL_TABLET | Freq: Two times a day (BID) | ORAL | 0 refills | Status: AC

## 2019-10-17 NOTE — Progress Notes (Signed)
Pt came in on 10/17/2019 for labs and PE  States - Gait worse for the past 3 days  Worse at night and in the mornings No new OTC medicines  No near syncope, gait more unsteady, no falls  Very tired, sleeping more Asked him more about possible mention of HA/Dizziness, no spinning or lightheadedness, just feels balance is off and he is congested with slight sore throat and pain to forehead, eyes and cheeks.  Still no fever, cough, chills, sweats, N, V, D, SOB, CP  Vitals with BMI 10/16/2019 10/16/2019 10/12/2019  Height 5\' 10"  - 5\' 11"   Weight 221 lbs - 221 lbs  BMI [id number] - [id number]  Systolic 123456 123456 123456  Diastolic 88 88 92  Pulse 123XX123 100 95    Physical Exam Vitals and nursing note reviewed.  Constitutional:      General: He is not in acute distress.    Appearance: Normal appearance. He is well-developed. He is obese. He is ill-appearing (chronically). He is not toxic-appearing or diaphoretic.  HENT:     Head: Normocephalic and atraumatic.     Jaw: No trismus.     Right Ear: Tympanic membrane, ear canal and external ear normal.     Left Ear: Tympanic membrane, ear canal and external ear normal.     Nose: Mucosal edema and rhinorrhea present.     Right Turbinates: Swollen. Not pale.     Left Turbinates: Swollen. Not pale.     Right Sinus: Maxillary sinus tenderness and frontal sinus tenderness present.     Left Sinus: Maxillary sinus tenderness and frontal sinus tenderness present.     Mouth/Throat:     Mouth: Mucous membranes are not pale, dry and not cyanotic.     Pharynx: Uvula midline. Posterior oropharyngeal erythema present. No uvula swelling.     Tonsils: No tonsillar exudate or tonsillar abscesses. 0 on the right. 0 on the left.  Eyes:     General: Lids are normal.        Right eye: No discharge.        Left eye: No discharge.     Conjunctiva/sclera: Conjunctivae normal.     Pupils: Pupils are equal, round, and reactive to light.  Neck:     Trachea: Trachea and phonation  normal. No tracheal deviation.  Cardiovascular:     Rate and Rhythm: Normal rate and regular rhythm.     Pulses:          Radial pulses are 2+ on the right side and 2+ on the left side.     Heart sounds: Normal heart sounds. No murmur. No friction rub. No gallop.   Pulmonary:     Effort: Pulmonary effort is normal. No tachypnea, accessory muscle usage or respiratory distress.     Breath sounds: Normal breath sounds. No stridor. No decreased breath sounds, wheezing, rhonchi or rales.  Abdominal:     General: Bowel sounds are normal. There is no distension.     Palpations: Abdomen is soft.     Tenderness: There is no abdominal tenderness.  Musculoskeletal:        General: Normal range of motion.     Cervical back: Normal range of motion and neck supple.  Skin:    General: Skin is warm and dry.     Capillary Refill: Capillary refill takes less than 2 seconds.     Coloration: Skin is not pale.     Findings: No rash.     Nails: There is  no clubbing.  Neurological:     Mental Status: He is alert.     Motor: No abnormal muscle tone.     Comments: Slowed gait, worse with initiation of walking Intension tremor No facial asymmetry, no droop, no slurred speech Grossly normal sensation to all extremities to light touch Grossly normal strength to all extremities b/l grip strength  Psychiatric:        Mood and Affect: Mood normal.        Speech: Speech normal.        Behavior: Behavior normal. Behavior is cooperative.     Comments: Minimal facial expressions (likely chronic/baseline)     Checking labs Start abx for acute bacterial sinusitis Still may be COVID - r/o with testing - ordered isolate  Close f/up with any worsening - ER precautions reviewed     ICD-10-CM   1. Essential hypertension  I10 CMP w GFR    amLODipine (NORVASC) 5 MG tablet   unstable over the past week, compliant with meds, no concern for end organ damage, BP may be elevated secondary to illness? screen for causes   2. Jittery  R45.0 CMP w GFR    CBC w/ Diff    A1C    UA w/reflex microscopy, LAB    Mag   vague sx, overall he feels bad, worse at night and in the morning, asked him to come in person  3. Shakiness  R25.1 CMP w GFR    CBC w/ Diff    A1C    UA w/reflex microscopy, LAB    Mag   unclear etiology - screen labs, would like to do in person PE, r/o uti, no obvious OTC meds or new Rx to worsen baseline gait w/Parkinson's  4. Other abnormalities of gait and mobility  R26.89 CMP w GFR    UA w/reflex microscopy, LAB    Mag   change from baseline, but non-focal  5. Rhinosinusitis  J31.0 amoxicillin-clavulanate (AUGMENTIN) 875-125 MG tablet   J32.9 COVID   when pt came in day following virtual visit, PE revealed sinus ttp and erythematous nasal mucosa and posterior oropharynx  6. Upper respiratory tract infection, unspecified type  J06.9 COVID   URI, ttp to sinuses cover with abx for ABS, may be viral, pt asked to get COVID test done, close f/up  7. Fatigue, unspecified type  R53.83    pt on 10/17/2019 stated he's extremely fatigued and sleeping all day, did not state with visit on 10/16/2019

## 2019-10-18 LAB — COMPLETE METABOLIC PANEL WITH GFR
AG Ratio: 1.6 (calc) (ref 1.0–2.5)
ALT: 17 U/L (ref 9–46)
AST: 19 U/L (ref 10–35)
Albumin: 4.2 g/dL (ref 3.6–5.1)
Alkaline phosphatase (APISO): 82 U/L (ref 35–144)
BUN: 21 mg/dL (ref 7–25)
CO2: 25 mmol/L (ref 20–32)
Calcium: 9.7 mg/dL (ref 8.6–10.3)
Chloride: 97 mmol/L — ABNORMAL LOW (ref 98–110)
Creat: 0.94 mg/dL (ref 0.70–1.33)
GFR, Est African American: 103 mL/min/{1.73_m2} (ref >=60)
GFR, Est Non African American: 89 mL/min/{1.73_m2} (ref >=60)
Globulin: 2.7 g/dL (calc) (ref 1.9–3.7)
Glucose, Bld: 391 mg/dL — ABNORMAL HIGH (ref 65–99)
Potassium: 5.1 mmol/L (ref 3.5–5.3)
Sodium: 134 mmol/L — ABNORMAL LOW (ref 135–146)
Total Bilirubin: 0.4 mg/dL (ref 0.2–1.2)
Total Protein: 6.9 g/dL (ref 6.1–8.1)

## 2019-10-18 LAB — CBC WITH DIFFERENTIAL/PLATELET
Absolute Monocytes: 531 cells/uL (ref 200–950)
Basophils Absolute: 30 cells/uL (ref 0–200)
Basophils Relative: 0.5 %
Eosinophils Absolute: 18 cells/uL (ref 15–500)
Eosinophils Relative: 0.3 %
HCT: 43.3 % (ref 38.5–50.0)
Hemoglobin: 14.5 g/dL (ref 13.2–17.1)
Lymphs Abs: 726 cells/uL — ABNORMAL LOW (ref 850–3900)
MCH: 29.2 pg (ref 27.0–33.0)
MCHC: 33.5 g/dL (ref 32.0–36.0)
MCV: 87.1 fL (ref 80.0–100.0)
MPV: 10.2 fL (ref 7.5–12.5)
Monocytes Relative: 9 %
Neutro Abs: 4596 cells/uL (ref 1500–7800)
Neutrophils Relative %: 77.9 %
Platelets: 272 10*3/uL (ref 140–400)
RBC: 4.97 10*6/uL (ref 4.20–5.80)
RDW: 12.8 % (ref 11.0–15.0)
Total Lymphocyte: 12.3 %
WBC: 5.9 10*3/uL (ref 3.8–10.8)

## 2019-10-18 LAB — HEMOGLOBIN A1C
Hgb A1c MFr Bld: 11.5 % of total Hgb — ABNORMAL HIGH (ref <5.7)
Mean Plasma Glucose: 283 (calc)
eAG (mmol/L): 15.7 (calc)

## 2019-10-18 LAB — MAGNESIUM: Magnesium: 2.2 mg/dL (ref 1.5–2.5)

## 2019-10-19 ENCOUNTER — Ambulatory Visit: Payer: PPO | Attending: Internal Medicine

## 2019-10-19 ENCOUNTER — Encounter: Payer: Self-pay | Admitting: Family Medicine

## 2019-10-19 DIAGNOSIS — Z20822 Contact with and (suspected) exposure to covid-19: Secondary | ICD-10-CM

## 2019-10-20 LAB — NOVEL CORONAVIRUS, NAA: SARS-CoV-2, NAA: NOT DETECTED

## 2019-10-25 ENCOUNTER — Telehealth: Payer: Self-pay

## 2019-10-25 NOTE — Telephone Encounter (Signed)
Patient given negative result and verbalized understanding  

## 2019-11-29 ENCOUNTER — Ambulatory Visit: Payer: PPO | Admitting: Family Medicine

## 2020-02-23 ENCOUNTER — Telehealth: Payer: Self-pay | Admitting: Family Medicine

## 2020-02-23 NOTE — Chronic Care Management (AMB) (Signed)
  Chronic Care Management   Outreach Note  02/23/2020 Name: Bryan Flowers MRN: [id number] DOB: 05-14-1961  Bryan Flowers is a 59 y.o. year old male who is a primary care patient of Hubbard Hartshorn, FNP. I reached out to Bryan Flowers by phone today in response to a referral sent by Bryan Flowers health plan.     An unsuccessful telephone outreach was attempted today. The patient was referred to the case management team for assistance with care management and care coordination.   Follow Up Plan: The care management team will reach out to the patient again over the next 7 days.  If patient returns call to provider office, please advise to call [facility] at [phone].  [place], [place] [address] Direct Dial: [phone] [email or web address] Website: [email or web address]

## 2020-02-28 NOTE — Chronic Care Management (AMB) (Signed)
  Chronic Care Management   Note  02/28/2020 Name: Bryan Flowers MRN: [id number] DOB: 1961-08-06  Bryan Flowers is a 59 y.o. year old male who is a primary care patient of Hubbard Hartshorn, FNP. I reached out to Bryan Flowers by phone today in response to a referral sent by Mr. JAMARCO [employer]ALDIVAR health plan.     Mr. Spratling was given information about Chronic Care Management services today including:  1. CCM service includes personalized support from designated clinical staff supervised by his physician, including individualized plan of care and coordination with other care providers 2. 24/7 contact phone numbers for assistance for urgent and routine care needs. 3. Service will only be billed when office clinical staff spend 20 minutes or more in a month to coordinate care. 4. Only one practitioner may furnish and bill the service in a calendar month. 5. The patient may stop CCM services at any time (effective at the end of the month) by phone call to the office staff. 6. The patient will be responsible for cost sharing (co-pay) of up to 20% of the service fee (after annual deductible is met).  Patient agreed to services and verbal consent obtained.   Follow up plan: Telephone appointment with care management team member scheduled for:04/04/2020  [place], [place], [place] [address] Direct Dial: [email or web address] Website: [email or web address]

## 2020-03-13 ENCOUNTER — Telehealth: Payer: Self-pay

## 2020-03-13 NOTE — Telephone Encounter (Signed)
First avail appt was next Thursday the 17th. He is out of his Lyrica medication. Could we go ahead and get refilled since he is out totally.

## 2020-03-13 NOTE — Telephone Encounter (Signed)
Pt will need appt for refills

## 2020-03-14 ENCOUNTER — Other Ambulatory Visit: Payer: Self-pay | Admitting: Family Medicine

## 2020-03-14 DIAGNOSIS — M797 Fibromyalgia: Secondary | ICD-10-CM

## 2020-03-14 MED ORDER — PREGABALIN 50 MG PO CAPS: 50.0000 mg | ORAL_CAPSULE | Freq: Two times a day (BID) | ORAL | 0 refills | Status: DC

## 2020-03-20 ENCOUNTER — Telehealth: Payer: Self-pay | Admitting: Family Medicine

## 2020-03-20 NOTE — Chronic Care Management (AMB) (Signed)
  Care Management   Note  03/20/2020 Name: Bryan Flowers MRN: [id number] DOB: [birth year]  Bryan Flowers is a 59 y.o. year old male who is a primary care patient of Hubbard Hartshorn, [place] and is actively engaged with the care management team. I reached out to [name] by phone today to assist with re-scheduling an initial visit with the Pharmacist due to meeting.  Follow up plan: Unsuccessful telephone outreach attempt made. The care management team will reach out to the patient again over the next 7 days. If patient returns call to provider office, please advise to call [facility] at [phone].  [place], [place] [address] Direct Dial: [phone] [email or web address] [email or web address]: [email or web address]

## 2020-03-21 ENCOUNTER — Ambulatory Visit (INDEPENDENT_AMBULATORY_CARE_PROVIDER_SITE_OTHER): Payer: PPO | Admitting: Family Medicine

## 2020-03-21 ENCOUNTER — Encounter: Payer: Self-pay | Admitting: Family Medicine

## 2020-03-21 ENCOUNTER — Other Ambulatory Visit: Payer: Self-pay

## 2020-03-21 VITALS — BP 124/82 | HR 90 | Temp 97.1°F | Resp 18 | Ht 70.0 in | Wt 202.6 lb

## 2020-03-21 DIAGNOSIS — R42 Dizziness and giddiness: Secondary | ICD-10-CM | POA: Diagnosis not present

## 2020-03-21 DIAGNOSIS — M797 Fibromyalgia: Secondary | ICD-10-CM

## 2020-03-21 DIAGNOSIS — G20 Parkinson's disease: Secondary | ICD-10-CM | POA: Diagnosis not present

## 2020-03-21 DIAGNOSIS — E782 Mixed hyperlipidemia: Secondary | ICD-10-CM

## 2020-03-21 DIAGNOSIS — Z794 Long term (current) use of insulin: Secondary | ICD-10-CM

## 2020-03-21 DIAGNOSIS — E559 Vitamin D deficiency, unspecified: Secondary | ICD-10-CM | POA: Diagnosis not present

## 2020-03-21 DIAGNOSIS — I10 Essential (primary) hypertension: Secondary | ICD-10-CM | POA: Diagnosis not present

## 2020-03-21 DIAGNOSIS — J310 Chronic rhinitis: Secondary | ICD-10-CM | POA: Diagnosis not present

## 2020-03-21 DIAGNOSIS — E0865 Diabetes mellitus due to underlying condition with hyperglycemia: Secondary | ICD-10-CM

## 2020-03-21 DIAGNOSIS — F339 Major depressive disorder, recurrent, unspecified: Secondary | ICD-10-CM | POA: Diagnosis not present

## 2020-03-21 DIAGNOSIS — E119 Type 2 diabetes mellitus without complications: Secondary | ICD-10-CM

## 2020-03-21 MED ORDER — AMLODIPINE BESYLATE 5 MG PO TABS: 5.0000 mg | ORAL_TABLET | Freq: Every day | ORAL | 3 refills | Status: DC

## 2020-03-21 NOTE — Progress Notes (Signed)
Name: Bryan Flowers   MRN: [id number]    DOB: 1961/04/13   Date:03/21/2020       Progress Note  Chief Complaint  Patient presents with  . Hypertension    medication refills  . Hyperlipidemia  . Diabetes  . Dizziness     Subjective:   Bryan Flowers is a 59 y.o. male, presents to clinic for routine follow-up, he has done virtual appointments but has not been in clinic for some time and is due for labs.  He has multiple uncontrolled chronic conditions, is insulin-dependent diabetic with poorly controlled, his wife passed away in the past 2 years and he has had a difficult time adjusting dealing with grief and adjusting to life without her.   DM:  uncontrolled Currently managing with xultophy and synjardy, did have low blood sugar episodes in the past, when skipping meals, none in the past couple of months. Blood sugars he checks in the morning, typically runs 150-200, no other reported highs or lows. Denies: Polyuria, polydipsia, vision changes, neuropathy, hypoglycemia Recent pertinent labs: Lab Results  Component Value Date   HGBA1C 11.5 (H) 10/17/2019   HGBA1C 10.0 (H) 09/12/2019   HGBA1C 10.3 (H) 02/16/2019   Standard of care and health maintenance: Urine Microalbumin: Done last December Foot exam:  Due today-done today good sensation and strength no claudication no wounds or ulcers he has good hygiene and meals are trimmed and well cared for without any thickening or fungal disease noted DM eye exam: eye appt coming up -sept - Dr. Agapito Games  ACEI/ARB: On quinapril  Hypertension:  Currently managed on quinapril-HCTZ 20-12.5, amlodipine, is on propranolol for tremor Pt reports good med compliance and denies any SE.  No lightheadedness, hypotension, syncope. Blood pressure today is well controlled, improved. BP Readings from Last 10 Encounters:  03/21/20 124/82  10/16/19 (!) 148/88  10/16/19 (!) 146/88  10/12/19 (!) 146/92  09/12/19 122/84  02/16/19 132/84   11/14/18 (!) 142/86  09/22/18 (!) 144/92  07/14/18 124/76  03/14/18 108/68  Pt denies CP, SOB, exertional sx, LE edema, palpitation, Ha's  Hyperlipidemia: Currently treated with atorvastatin 20 mg, pt reports good med compliance Last Lipids:   Last lipid panel poorly controlled, due today for repeat lipid panel, he was previously on simvastatin and also previously managed with gemfibrozil many years ago, appears he was switched to atorvastatin February 2020.  Based on the prior prescription refills I can see in the chart he should be out of medications but he states that he has not run out yet Lab Results  Component Value Date   CHOL 280 (H) 09/12/2019   HDL 50 09/12/2019   LDLCALC 167 (H) 09/12/2019   TRIG 374 (H) 09/12/2019   CHOLHDL 5.6 (H) 09/12/2019   - Denies: Chest pain, shortness of breath, myalgias, claudication   He has dealt with obesity with multiple comorbid conditions for the past several years, is very difficult over the past 2 years with the death of his wife for him to prepare healthy meals but over the past 6 months or so he is trying to eat more healthy, he is exercizing more, feels better 30 lbs weight loss in the past year -previously BMI was obese category now he has BMI of 29 and overweight category  Wt Readings from Last 5 Encounters:  03/21/20 202 lb 9.6 oz (91.9 kg)  10/16/19 221 lb (100.2 kg)  10/12/19 221 lb (100.2 kg)  05/26/19 231 lb (104.8 kg)  02/16/19 221  lb 3.2 oz (100.3 kg)   BMI Readings from Last 5 Encounters:  03/21/20 29.07 kg/m  10/16/19 31.71 kg/m  10/12/19 30.82 kg/m  05/26/19 32.22 kg/m  02/16/19 30.85 kg/m   Hyperlipidemia: Currently treated with atorvastatin, pt reports good med compliance  Last Lipids: Lab Results  Component Value Date   CHOL 280 (H) 09/12/2019   HDL 50 09/12/2019   LDLCALC 167 (H) 09/12/2019   TRIG 374 (H) 09/12/2019   CHOLHDL 5.6 (H) 09/12/2019   - Denies: Chest pain, shortness of breath, myalgias,  claudication  Severe Vit D deficiency -last vitamin D level was very low he was treated with prescription supplement and then transition to daily over-the-counter 1000 IU vitamin D supplement Last vitamin D Lab Results  Component Value Date   VD25OH 10 (L) 09/12/2019   Has history of Parkinson's which is managed by Dr. Brigitte Pulse neurology at [facility]-patient has done very well on his Sinemet and propranolol his gait mobility are good he is not having frequent falls feels very steady his tremor is well controlled. He has fibromyalgia which is managed with Cymbalta and Lyrica-PCP refills Lyrica and psychiatrist does Cymbalta and other medications including Lamictal, Seroquel  MDD: Depression screen [facility] 2/9 03/21/2020 10/16/2019 10/12/2019  Decreased Interest 0 0 0  Down, Depressed, Hopeless 0 0 0  PHQ - 2 Score 0 0 0  Altered sleeping 0 0 -  Tired, decreased energy 0 0 -  Change in appetite 0 0 -  Feeling bad or failure about yourself  0 0 -  Trouble concentrating 0 0 -  Moving slowly or fidgety/restless 0 0 -  Suicidal thoughts 0 0 -  PHQ-9 Score 0 0 -  Difficult doing work/chores Not difficult at all - -  Some recent data might be hidden   PHQ negative, pt endorses feeling better with improved lifestyle efforts, has been a hard adjustment with the death of his wife 2 years ago - he is seeing psychiatry and compliant with meds and f/up   He reports having some right ear discomfort and some increased frequency of vertigo which she has had before he uses meclizine over-the-counter to manage.  He does asked that I check his ear today.  He does have some nasal discharge and congestion is not currently on Claritin and Flonase which he was previously prescribed and instructed to take.  He denies headache, facial pain or pressure, fever, chills, sweats, cough, chest congestion, shortness of breath    Current Outpatient Medications:  .  acetaminophen (TYLENOL) 500 MG tablet, Take 1,000 mg by  mouth every 6 (six) hours as needed (pain)., Disp: , Rfl:  .  amLODipine (NORVASC) 5 MG tablet, Take 1 tablet (5 mg total) by mouth daily., Disp: 90 tablet, Rfl: 3 .  Armodafinil 250 MG tablet, Take by mouth., Disp: , Rfl:  .  atorvastatin (LIPITOR) 20 MG tablet, Take 1 tablet (20 mg total) by mouth daily., Disp: 90 tablet, Rfl: 3 .  b complex vitamins capsule, Take 1 capsule by mouth daily., Disp: , Rfl:  .  carbidopa-levodopa (SINEMET CR) 50-200 MG per tablet, Take 1 tablet by mouth 3 (three) times daily. , Disp: , Rfl:  .  cholecalciferol (VITAMIN D3) 25 MCG (1000 UT) tablet, Take 1,000 Units by mouth daily., Disp: , Rfl:  .  DULoxetine (CYMBALTA) 60 MG capsule, Take 120 mg by mouth at bedtime., Disp: , Rfl:  .  Empagliflozin-metFORMIN HCl (SYNJARDY) 12.02-999 MG TABS, Take 1 tablet by mouth  2 (two) times daily at 10 AM and 5 PM., Disp: 60 tablet, Rfl: 3 .  Insulin Degludec-Liraglutide (XULTOPHY) 100-3.6 UNIT-MG/ML SOPN, Inject 50 Units into the skin daily., Disp: 15 mL, Rfl: 2 .  lamoTRIgine (LAMICTAL) 200 MG tablet, Take 200 mg by mouth at bedtime., Disp: , Rfl:  .  loratadine (CLARITIN) 10 MG tablet, Take 1 tablet (10 mg total) by mouth daily., Disp: 20 tablet, Rfl: 0 .  LORazepam (ATIVAN) 1 MG tablet, TK 1 T PO TID PRN, Disp: , Rfl:  .  pregabalin (LYRICA) 50 MG capsule, Take 1 capsule (50 mg total) by mouth 2 (two) times daily. TAKE ONE CAPSULE BY MOUTH AT BEDTIME, Disp: 60 capsule, Rfl: 0 .  propranolol ER (INDERAL LA) 60 MG 24 hr capsule, Take 60 mg by mouth at bedtime. , Disp: , Rfl:  .  QUEtiapine (SEROQUEL XR) 400 MG 24 hr tablet, TK 1 T PO QD AT 8PM ONE HOUR BEFORE OR AFTER A MEAL, Disp: , Rfl: 2 .  quinapril-hydrochlorothiazide (ACCURETIC) 20-12.5 MG tablet, Take 1 tablet by mouth daily., Disp: 30 tablet, Rfl: 3 .  rOPINIRole (REQUIP) 3 MG tablet, Take by mouth., Disp: , Rfl:  .  Vitamin D, Ergocalciferol, (DRISDOL) 1.25 MG (50000 UT) CAPS capsule, Take 1 capsule (50,000 Units total)  by mouth every 7 (seven) days. Then switch to 1000IU once daily OTC, Disp: 8 capsule, Rfl: 0 .  fluticasone (FLONASE) 50 MCG/ACT nasal spray, Place 2 sprays into both nostrils daily. (Patient not taking: Reported on 03/21/2020), Disp: 16 g, Rfl: 6 .  glucose blood test strip, Check once daily fasting, and up to 3 times a day as needed. (Patient not taking: Reported on 03/21/2020), Disp: 100 each, Rfl: 12 .  Insulin Pen Needle (NOVOFINE) 32G X 6 MM MISC, 1 each by Does not apply route daily. (Patient not taking: Reported on 03/21/2020), Disp: 100 each, Rfl: 3  Patient Active Problem List   Diagnosis Date Noted  . Morbid obesity ([place]) 02/16/2019  . Pure hypercholesterolemia 02/16/2019  . Grief 02/16/2019  . Major depression, recurrent ([place]) 03/14/2018  . Bipolar 1 disorder, manic, moderate ([place])   . Vitamin D deficiency 09/10/2017  . Gastroesophageal reflux disease without esophagitis 09/10/2017  . History of iron deficiency anemia 03/30/2016  . History of iron deficiency anemia 03/24/2016  . History of GI bleed 03/15/2016  . Hyponatremia 03/15/2016  . Parkinson disease ([place])   . Hypertension   . Anxiety 08/19/2015  . Abnormal ECG 04/09/2015  . Back ache 04/09/2015  . Diabetes mellitus type 2 in obese ([place]) 04/09/2015  . Obstructive sleep apnea 04/09/2015  . HDL deficiency 04/09/2015  . Hearing loss, sensorineural, combined types 04/09/2015  . HLD (hyperlipidemia) 04/09/2015  . Vocal cord atrophy 05/29/2014  . Fibromyalgia 03/15/2014  . Testicular hypofunction 05/17/2009  . Migraine with aura 05/14/2009    Past Surgical History:  Procedure Laterality Date  . ESOPHAGOGASTRODUODENOSCOPY Left 03/16/2016   Procedure: ESOPHAGOGASTRODUODENOSCOPY (EGD);  Surgeon: Manus Gunning, MD;  Location: [facility];  Service: Gastroenterology;  Laterality: Left;  . FOOT FRACTURE SURGERY Right     Family History  Problem Relation Age of Onset  . Cancer Mother   . Heart disease Father     . Arthritis Brother     Social History   Tobacco Use  . Smoking status: Never Smoker  . Smokeless tobacco: Never Used  . Tobacco comment: Non-smoker  Vaping Use  . Vaping Use: Never used  Substance Use Topics  .  Alcohol use: No    Alcohol/week: 0.0 standard drinks  . Drug use: No     No Known Allergies  Health Maintenance  Topic Date Due  . COVID-19 Vaccine (1) Never done  . OPHTHALMOLOGY EXAM  05/06/2018  . COLONOSCOPY  06/02/2019  . HEMOGLOBIN A1C  04/15/2020  . INFLUENZA VACCINE  05/05/2020  . FOOT EXAM  03/21/2021  . TETANUS/TDAP  04/22/2021  . PNEUMOCOCCAL POLYSACCHARIDE VACCINE AGE 51-64 HIGH RISK  Completed  . Hepatitis C Screening  Completed  . HIV Screening  Completed    Chart Review Today: I personally reviewed active problem list, medication list, allergies, family history, social history, health maintenance, notes from last encounter, lab results, imaging with the patient/caregiver today.   Review of Systems  10 Systems reviewed and are negative for acute change except as noted in the HPI.     Objective:   Vitals:   03/21/20 1023  BP: 124/82  Pulse: 90  Resp: 18  Temp: (!) 97.1 F (36.2 C)  TempSrc: Temporal  SpO2: 96%  Weight: 202 lb 9.6 oz (91.9 kg)  Height: 5\' 10"  (1.778 m)    Body mass index is 29.07 kg/m.  Physical Exam Vitals and nursing note reviewed.  Constitutional:      General: He is not in acute distress.    Appearance: Normal appearance. He is well-developed and overweight. He is ill-appearing (chronically ill). He is not toxic-appearing or diaphoretic.  HENT:     Head: Normocephalic and atraumatic.     Jaw: No trismus.     Right Ear: Tympanic membrane, ear canal and external ear normal.     Left Ear: Tympanic membrane, ear canal and external ear normal.     Nose: Mucosal edema, congestion and rhinorrhea present.     Right Turbinates: Enlarged and swollen.     Left Turbinates: Enlarged and swollen.     Right Sinus: No  maxillary sinus tenderness or frontal sinus tenderness.     Left Sinus: No maxillary sinus tenderness or frontal sinus tenderness.     Comments: Erythematous nasal mucosa with copious nasal discharge    Mouth/Throat:     Mouth: Mucous membranes are moist. Mucous membranes are not pale, not dry and not cyanotic.     Pharynx: Oropharynx is clear. Uvula midline. No oropharyngeal exudate, posterior oropharyngeal erythema or uvula swelling.     Tonsils: No tonsillar exudate or tonsillar abscesses.  Eyes:     General: Lids are normal.        Right eye: No discharge.        Left eye: No discharge.     Conjunctiva/sclera: Conjunctivae normal.     Pupils: Pupils are equal, round, and reactive to light.  Neck:     Trachea: Trachea and phonation normal. No tracheal deviation.  Cardiovascular:     Rate and Rhythm: Normal rate and regular rhythm.     Pulses:          Radial pulses are 2+ on the right side and 2+ on the left side.     Heart sounds: Normal heart sounds. No murmur heard.  No friction rub. No gallop.   Pulmonary:     Effort: Pulmonary effort is normal. No tachypnea, accessory muscle usage or respiratory distress.     Breath sounds: Normal breath sounds. No stridor. No decreased breath sounds, wheezing, rhonchi or rales.  Abdominal:     General: Bowel sounds are normal. There is no distension.  Palpations: Abdomen is soft.     Tenderness: There is no abdominal tenderness.  Musculoskeletal:        General: Normal range of motion.     Cervical back: Normal range of motion and neck supple.  Skin:    General: Skin is warm and dry.     Capillary Refill: Capillary refill takes less than 2 seconds.     Coloration: Skin is pale (per his baseline).     Findings: No rash.     Nails: There is no clubbing.  Neurological:     Mental Status: He is alert and oriented to person, place, and time.     Motor: No abnormal muscle tone.     Coordination: Coordination normal.     Gait: Gait  normal.  Psychiatric:        Attention and Perception: Attention normal.        Mood and Affect: Mood normal.        Speech: Speech normal.        Behavior: Behavior normal. Behavior is cooperative.        Thought Content: Thought content normal. Thought content does not include suicidal ideation.         Assessment & Plan:     ICD-10-CM   1. Diabetes mellitus due to underlying condition with hyperglycemia, with long-term current use of insulin (HCC)  C16.60 COMPLETE METABOLIC PANEL WITH GFR   Z79.4 Lipid panel    Hemoglobin A1c    Ambulatory referral to Ophthalmology   Uncontrolled, compliant with medications but overdue for labs, reported daily blood sugars not at goal, A1c, CMP ordered, foot exam done, referred for eye exam  2. Essential hypertension  Y30 COMPLETE METABOLIC PANEL WITH GFR    amLODipine (NORVASC) 5 MG tablet   Well-controlled today, history of being uncontrolled recently, improved with some diet lifestyle changes exercising and weight loss, decrease med dose if able  3. Mixed hyperlipidemia  Z60.1 COMPLETE METABOLIC PANEL WITH GFR    Lipid panel   Poor lipid control, statin changed in the last year, labs from 6 months ago were still uncontrolled, due for recheck today refill meds after reviewing labs  4. Diabetes mellitus with coincident hypertension (HCC)  U93.2 COMPLETE METABOLIC PANEL WITH GFR   I10 Hemoglobin A1c   See #1 Will refill meds after checking labs, specifically renal function and A1c likely will need some med adjustments to obtain glycemic control  5. Fibromyalgia  M79.7    Cymbalta managed by psychiatry, he does request refills on Lyrica  6. Vitamin D deficiency  E55.9 VITAMIN D 25 Hydroxy (Vit-D Deficiency, Fractures)   Very low vitamin D with history of persistent vitamin D deficiency, previously given prescription supplement and transition to OTC, recheck today  7. Recurrent major depressive disorder, remission status unspecified ([place])  F33.9      Managed by psychiatry, overall his mood appears good, PHQ reviewed today  8. Essential hypertension  T55 COMPLETE METABOLIC PANEL WITH GFR    amLODipine (NORVASC) 5 MG tablet   unstable over the past week, compliant with meds, no concern for end organ damage, BP may be elevated secondary to illness? screen for causes  9. Vertigo  R42    History of vertigo with some recent increased frequency with right ear symptoms, normal cerumen and TM, evident rhinitis and nasal discharge may be ETD  10. Rhinitis, unspecified type  J31.0    start flonase and antihistamine if tolerated, use for the next 2  to 3 weeks daily follow-up if not improving or if worsening  11. Morbid obesity (HCC)  E66.01    Weight loss and BMI improvement, now out of obese range, he has been working on lifestyle changes, hope that weight loss is not secondary to uncontrolled IDDM  12. Parkinson disease ([place])  G20    Per Dr. [name] at [facility], Sinemet and propranolol working well for symptoms his gait mobility are good today    Greater than 50% of this visit was spent in direct face-to-face counseling, obtaining history and physical, discussing and educating pt on treatment plan.  Total time of this visit was 45+ min, level 5 billing due to multiple uncontrolled chronic conditions and comorbidities with acute complaints as well today (25+ min spent with chart review and documentation today and roughly 25 min spent in room with pt to get thorough hx, PE, DM foot exam, discuss and educate pt on his chornic conditions, past abnormal labs, explain DM care and monitoring, med adjustments, etc).  Remainder of time involved but was not limited to reviewing chart (recent and pertinent OV notes and labs), documentation in EMR, and coordinating care and treatment plan.    Return in about 3 months (around 06/21/2020) for Routine follow-up in office (IDDM).   [name], PA-C 03/21/20 10:36 AM

## 2020-03-21 NOTE — Patient Instructions (Signed)
I hope to decrease you blood pressure medicines once I check your labs  Start flonase daily for the next 2-3 weeks - it should help with ear symptoms, please follow up if no improvement or if any worsening

## 2020-03-22 ENCOUNTER — Other Ambulatory Visit: Payer: Self-pay | Admitting: Family Medicine

## 2020-03-22 DIAGNOSIS — E7800 Pure hypercholesterolemia, unspecified: Secondary | ICD-10-CM

## 2020-03-22 DIAGNOSIS — E0865 Diabetes mellitus due to underlying condition with hyperglycemia: Secondary | ICD-10-CM

## 2020-03-22 DIAGNOSIS — I10 Essential (primary) hypertension: Secondary | ICD-10-CM

## 2020-03-22 DIAGNOSIS — E1169 Type 2 diabetes mellitus with other specified complication: Secondary | ICD-10-CM

## 2020-03-22 LAB — HEMOGLOBIN A1C
Hgb A1c MFr Bld: 12 % of total Hgb — ABNORMAL HIGH (ref <5.7)
Mean Plasma Glucose: 298 (calc)
eAG (mmol/L): 16.5 (calc)

## 2020-03-22 LAB — COMPLETE METABOLIC PANEL WITH GFR
AG Ratio: 1.9 (calc) (ref 1.0–2.5)
ALT: 21 U/L (ref 9–46)
AST: 14 U/L (ref 10–35)
Albumin: 4.8 g/dL (ref 3.6–5.1)
Alkaline phosphatase (APISO): 73 U/L (ref 35–144)
BUN: 15 mg/dL (ref 7–25)
CO2: 29 mmol/L (ref 20–32)
Calcium: 10.1 mg/dL (ref 8.6–10.3)
Chloride: 97 mmol/L — ABNORMAL LOW (ref 98–110)
Creat: 0.92 mg/dL (ref 0.70–1.33)
GFR, Est African American: 106 mL/min/{1.73_m2} (ref >=60)
GFR, Est Non African American: 91 mL/min/{1.73_m2} (ref >=60)
Globulin: 2.5 g/dL (calc) (ref 1.9–3.7)
Glucose, Bld: 348 mg/dL — ABNORMAL HIGH (ref 65–99)
Potassium: 5.8 mmol/L — ABNORMAL HIGH (ref 3.5–5.3)
Sodium: 134 mmol/L — ABNORMAL LOW (ref 135–146)
Total Bilirubin: 0.5 mg/dL (ref 0.2–1.2)
Total Protein: 7.3 g/dL (ref 6.1–8.1)

## 2020-03-22 LAB — LIPID PANEL
Cholesterol: 345 mg/dL — ABNORMAL HIGH (ref <200)
HDL: 42 mg/dL (ref >=40)
Non-HDL Cholesterol (Calc): 303 mg/dL (calc) — ABNORMAL HIGH (ref <130)
Total CHOL/HDL Ratio: 8.2 (calc) — ABNORMAL HIGH (ref <5.0)
Triglycerides: 411 mg/dL — ABNORMAL HIGH (ref <150)

## 2020-03-22 LAB — VITAMIN D 25 HYDROXY (VIT D DEFICIENCY, FRACTURES): Vit D, 25-Hydroxy: 13 ng/mL — ABNORMAL LOW (ref 30–100)

## 2020-03-22 MED ORDER — SYNJARDY 12.5-1000 MG PO TABS: 1.0000 | ORAL_TABLET | Freq: Two times a day (BID) | ORAL | 2 refills | Status: DC

## 2020-03-22 MED ORDER — VITAMIN D (ERGOCALCIFEROL) 1.25 MG (50000 UNIT) PO CAPS: 50000.0000 [IU] | ORAL_CAPSULE | ORAL | 1 refills | Status: DC

## 2020-03-22 MED ORDER — XULTOPHY 100-3.6 UNIT-MG/ML ~~LOC~~ SOPN: 50.0000 [IU] | PEN_INJECTOR | Freq: Every day | SUBCUTANEOUS | 0 refills | Status: DC

## 2020-03-22 MED ORDER — ROSUVASTATIN CALCIUM 20 MG PO TABS: 20.0000 mg | ORAL_TABLET | Freq: Every day | ORAL | 3 refills | Status: DC

## 2020-03-22 MED ORDER — ROSUVASTATIN CALCIUM 20 MG PO TABS
20.0000 mg | ORAL_TABLET | Freq: Every day | ORAL | 3 refills | Status: DC
Start: 2020-03-22 — End: 2020-03-22

## 2020-03-22 MED ORDER — QUINAPRIL HCL 20 MG PO TABS: 20.0000 mg | ORAL_TABLET | Freq: Every day | ORAL | 1 refills | Status: DC

## 2020-03-25 NOTE — Chronic Care Management (AMB) (Signed)
  Chronic Care Management   Note  03/25/2020 Name: [name] MRN: [id number] DOB: Oct 23, 1960  Bryan Flowers is a 59 y.o. year old male who is a primary care patient of Laurell Roof and is actively engaged with the care management team. I reached out to Bryan Flowers by phone today to assist with re-scheduling an initial visit with the Pharmacist  Follow up plan: A second Unsuccessful telephone outreach attempt made. The care management team will reach out to the patient again over the next 7 days. If patient returns call to provider office, please advise to call Patrick at [phone].  [place], [place] [address] Direct Dial: [phone] [email or web address]: [email or web address]

## 2020-03-27 ENCOUNTER — Telehealth: Payer: Self-pay

## 2020-03-27 DIAGNOSIS — E875 Hyperkalemia: Secondary | ICD-10-CM

## 2020-03-27 DIAGNOSIS — E669 Obesity, unspecified: Secondary | ICD-10-CM

## 2020-03-27 NOTE — Telephone Encounter (Signed)
-----   Message from [name], [place] sent at 03/26/2020  6:30 PM EDT ----- Have him do repeat labs - this week if possible to do bmp for hyperkalemia - high potassium can be dangerous so that needs to be done fastest - we can order labs to [facility] or elsewhere if that will help him get it done in the next week (hospital, [facility] etc- please order)  Refer to endo urgently for IDDM with hyperglycemia- they can help adjust meds and get that controlled His injection will have to be completely changed and endo can do that better/faster than we can with our current schedule limitations - he needs to take his meter in with him to endo appt.  He told me he was taking all meds, but he would have been out of statin for several months based off meds in chart - have him come here in 3 months for OV - will need to do lipids again but fasting.  He needs to call us if he does not have his statin .

## 2020-03-28 NOTE — Chronic Care Management (AMB) (Signed)
  Chronic Care Management   Note  03/28/2020 Name: TARQUIN WELCHER MRN: [id number] DOB: 04-Oct-1961  [name] is a 59 y.o. year old male who is a primary care patient of Laurell Roof and is actively engaged with the care management team. I reached out to [name] by phone today to assist with re-scheduling an initial visit with the Pharmacist.  Follow up plan: Telephone appointment with care management team member scheduled for:04/24/2020.  [place],  [address] Direct Dial: [phone] [email or web address]: [email or web address]

## 2020-04-04 ENCOUNTER — Telehealth: Payer: PPO

## 2020-04-24 ENCOUNTER — Telehealth: Payer: PPO

## 2020-04-25 ENCOUNTER — Telehealth: Payer: Self-pay | Admitting: Family Medicine

## 2020-04-25 NOTE — Chronic Care Management (AMB) (Signed)
  Care Management   Note  04/25/2020 Name: Bryan Flowers MRN: [id number] DOB: 1961-01-28  Bryan Flowers is a 59 y.o. year old male who is a primary care patient of Bryan Flowers and is actively engaged with the care management team. I reached out to Bryan Flowers by phone today to assist with re-scheduling an initial visit with the Pharmacist  Follow up plan: Telephone appointment with care management team member scheduled for:05/14/2020  Noreene Larsson, [place], [place], [place] [address] Direct Dial: [phone] [email or web address]: [email or web address]

## 2020-05-14 ENCOUNTER — Telehealth: Payer: Self-pay | Admitting: Pharmacist

## 2020-05-14 ENCOUNTER — Other Ambulatory Visit: Payer: Self-pay

## 2020-05-14 ENCOUNTER — Ambulatory Visit: Payer: PPO

## 2020-05-14 ENCOUNTER — Telehealth: Payer: PPO | Admitting: Pharmacist

## 2020-05-14 NOTE — Chronic Care Management (AMB) (Signed)
Chronic Care Management Pharmacy  Name: Bryan Flowers  MRN: [id number] DOB: 1961-04-20  Chief Complaint/ HPI  Bryan Flowers,  58 y.o. , male presents for their Initial CCM visit with the clinical pharmacist via telephone due to COVID-19 Pandemic.  PCP : Bryan Grana, PA-C  Their chronic conditions include: DM, HTN, HLD, PD  Office Visits: 6/17 DM, [place], BP 124/82 P 90 Wt 202.5 BMI 29.07, recent widower, FBG 150 - 200, suspect nonadherence, sees psych, PHQ neg,   Consult Visit: NA  Medications: Outpatient Encounter Medications as of 05/14/2020  Medication Sig  . acetaminophen (TYLENOL) 500 MG tablet Take 1,000 mg by mouth every 6 (six) hours as needed (pain).  . Armodafinil 250 MG tablet Take by mouth in the morning.   Marland Kitchen b complex vitamins capsule Take 1 capsule by mouth daily.  . carbidopa-levodopa (SINEMET CR) 50-200 MG per tablet Take 1 tablet by mouth 3 (three) times daily.   . cholecalciferol (VITAMIN D3) 25 MCG (1000 UT) tablet Take 1,000 Units by mouth daily.  . DULoxetine (CYMBALTA) 60 MG capsule Take 120 mg by mouth at bedtime.  . Empagliflozin-metFORMIN HCl (SYNJARDY) 12.02-999 MG TABS Take 1 tablet by mouth 2 (two) times daily at 10 AM and 5 PM.  . glucose blood test strip Check once daily fasting, and up to 3 times a day as needed.  . Insulin Degludec-Liraglutide (XULTOPHY) 100-3.6 UNIT-MG/ML SOPN Inject 50 Units into the skin daily.  . Insulin Pen Needle (NOVOFINE) 32G X 6 MM MISC 1 each by Does not apply route daily.  Marland Kitchen lamoTRIgine (LAMICTAL) 200 MG tablet Take 400 mg by mouth at bedtime.   Marland Kitchen LORazepam (ATIVAN) 1 MG tablet Typically takes  1 to 2 times daily  . pregabalin (LYRICA) 50 MG capsule Take 1 capsule (50 mg total) by mouth 2 (two) times daily. TAKE ONE CAPSULE BY MOUTH AT BEDTIME  . propranolol ER (INDERAL LA) 60 MG 24 hr capsule Take 60 mg by mouth at bedtime.   Marland Kitchen QUEtiapine (SEROQUEL XR) 400 MG 24 hr tablet TK 1 T PO QD AT 8PM ONE HOUR BEFORE OR  AFTER A MEAL  . quinapril (ACCUPRIL) 20 MG tablet Take 1 tablet (20 mg total) by mouth at bedtime.  Marland Kitchen rOPINIRole (REQUIP) 3 MG tablet Take by mouth at bedtime.   . rosuvastatin (CRESTOR) 20 MG tablet Take 1 tablet (20 mg total) by mouth at bedtime.  Marland Kitchen amLODipine (NORVASC) 5 MG tablet Take 1 tablet (5 mg total) by mouth daily. (Patient not taking: Reported on 05/14/2020)  . fluticasone (FLONASE) 50 MCG/ACT nasal spray Place 2 sprays into both nostrils daily. (Patient not taking: Reported on 03/21/2020)  . Vitamin D, Ergocalciferol, (DRISDOL) 1.25 MG (50000 UNIT) CAPS capsule Take 1 capsule (50,000 Units total) by mouth every 7 (seven) days. (Patient not taking: Reported on 05/14/2020)   No facility-administered encounter medications on file as of 05/14/2020.      Financial Resource Strain: Low Risk   . Difficulty of Paying Living Expenses: Not hard at all    Current Diagnosis/Assessment:  Goals Addressed            This Visit's Progress   . Chronic Care Management       CARE PLAN ENTRY (see longitudinal plan of care for additional care plan information)  Current Barriers:  . Chronic Disease Management support, education, and care coordination needs related to Hypertension, Hyperlipidemia, Diabetes, and Depression   Hypertension BP Readings from Last 3 Encounters:  03/21/20 124/82  10/16/19 (!) 148/88  10/16/19 (!) 146/88   . Pharmacist Clinical Goal(s): o Over the next 90 days, patient will work with PharmD and providers to maintain BP goal <130/80 . Current regimen:  o Quinapril 16m at bedtime . Interventions: o None . Patient self care activities - Over the next 90 days, patient will: o Check BP if possible, document, and provide at future appointments o Ensure daily salt intake < 2300 mg/day  Hyperlipidemia Lab Results  Component Value Date/Time   [facility] 03/21/2020 11:04 AM     Comment:     . LDL cholesterol not calculated. Triglyceride levels greater than 400  mg/dL invalidate calculated LDL results. . Reference range: <100 . Desirable range <100 mg/dL for primary prevention;   <70 mg/dL for patients with CHD or diabetic patients  with > or = 2 CHD risk factors. .Marland KitchenLDL-C is now calculated using the Martin-Hopkins  calculation, which is a validated novel method providing  better accuracy than the Friedewald equation in the  estimation of LDL-C.  MCresenciano Genreet al. JAnnamaria Helling 26295;284(13: 2061-2068  (http://education.[employer].com/faq/FAQ164)    . Pharmacist Clinical Goal(s): o Over the next 90 days, patient will work with PharmD and providers to achieve LDL goal < 100. TG too high for LDL reading. . Current regimen:  o Crestor 212mdaily . Interventions: o Start fish oil 1g 2 caps by mouth twice daily . Patient self care activities - Over the next 90 days, patient will: o Take 4g total of fish oil daily o Reduce intake of saturated fat such as fried food  Diabetes Lab Results  Component Value Date/Time   HGBA1C 12.0 (H) 03/21/2020 11:04 AM   HGBA1C 11.5 (H) 10/17/2019 01:25 PM   HGBA1C 10.5 (A) 11/14/2018 02:04 PM   HGBA1C 6.3 07/14/2018 01:25 PM   . Pharmacist Clinical Goal(s): o Over the next 90 days, patient will work with PharmD and providers to achieve A1c goal <7% . Current regimen:  o Synjardy 12.26m31m000mg 10am and 5pm o Xultophy inject under the skin 50 units daily . Interventions: o Work with new pharmacy to make sure medications are available at all times . Patient self care activities - Over the next 90 days, patient will: o Check blood sugar once daily, document, and provide at future appointments o Contact provider with any episodes of hypoglycemia  Medication management . Pharmacist Clinical Goal(s): o Over the next 90 days, patient will work with PharmD and providers to achieve optimal medication adherence . Current pharmacy: [facility] . Interventions o Comprehensive medication review performed. o Utilize  [facility] for medication synchronization, packaging and delivery . Patient self care activities - Over the next 90 days, patient will: o Focus on medication adherence by working with PharmD and new pharmacy to ensure full access to medication o Take medications as prescribed o Report any questions or concerns to PharmD and/or provider(s)  Initial goal documentation       Diabetes   Recent Relevant Labs: Lab Results  Component Value Date/Time   HGBA1C 12.0 (H) 03/21/2020 11:04 AM   HGBA1C 11.5 (H) 10/17/2019 01:25 PM   HGBA1C 10.5 (A) 11/14/2018 02:04 PM   HGBA1C 6.3 07/14/2018 01:25 PM   MICROALBUR 1.2 09/12/2019 12:00 AM   MICROALBUR 20 03/14/2018 02:22 PM   MICROALBUR <0.2 07/02/2017 08:11 AM   MICROALBUR NEG 03/12/2016 03:16 PM     Checking BG: Rarely  Patient is currently uncontrolled on the following medications: SynFleeta Emmerast  diabetic Foot exam: No results found for: HMDIABEYEEXA  Last diabetic Eye exam: No results found for: HMDIABFOOTEX   We discussed:  Pt could not identify source of increasing A1c. States 100% medication adherence. Reports decreased activity and poor diet due to widower depression. A lot of stress.  Plan  Continue current medications   Hypertension   BP goal is:  <140/90  Office blood pressures are  BP Readings from Last 3 Encounters:  03/21/20 124/82  10/16/19 (!) 148/88  10/16/19 (!) 146/88   Patient checks BP at home infrequently Patient home BP readings are ranging: NA  Patient has failed these meds in the past: amlodipine Patient is currently controlled on the following medications:  . Quinapril 52m qhs  We discussed: Normally BP well controlled. Very upset in January.  Plan  Continue current medications   Hyperlipidemia   LDL goal < 100  Lipid Panel     Component Value Date/Time   CHOL 345 (H) 03/21/2020 1104   CHOL 154 03/12/2016 1008   TRIG 411 (H) 03/21/2020 1104   HDL 42 03/21/2020 1104    HDL 33 (L) 03/12/2016 1008   [place] 03/21/2020 1104     Comment:     . LDL cholesterol not calculated. Triglyceride levels greater than 400 mg/dL invalidate calculated LDL results. . Reference range: <100 . Desirable range <100 mg/dL for primary prevention;   <70 mg/dL for patients with CHD or diabetic patients  with > or = 2 CHD risk factors. .Marland KitchenLDL-C is now calculated using the Martin-Hopkins  calculation, which is a validated novel method providing  better accuracy than the Friedewald equation in the  estimation of LDL-C.  MCresenciano Genreet al. JAnnamaria Helling 24481;856(31: 2061-2068  (http://education.[facility].com/faq/FAQ164)     Hepatic Function Latest Ref Rng & Units 03/21/2020 10/17/2019 09/12/2019  Total Protein 6.1 - 8.1 g/dL 7.3 6.9 6.7  Albumin 3.5 - 5.0 g/dL - - -  AST 10 - 35 U/L 14 19 16   ALT 9 - 46 U/L 21 17 22   Alk Phosphatase 38 - 126 U/L - - -  Total Bilirubin 0.2 - 1.2 mg/dL 0.5 0.4 0.3     The ASCVD Risk score (GAurora, et al., 2013) failed to calculate for the following reasons:   The valid total cholesterol range is 130 to 320 mg/dL   Patient has failed these meds in past: NA Patient is currently uncontrolled on the following medications:  . Crestor 250mdaily  We discussed:   Crestor myalgia went away after beginning Can't determine control until TG lower  Plan  Patient to start fish oil 1g 2 caps by mouth twice daily  Medication Management   Pt uses [facility] all medications Uses pill box? Yes Pt endorses 100% compliance Interested in [facility]  We discussed:  Likely self report of adherence is very optimistic in this case. Suspect near total non-adherence. Denies cost issues.  This patient will likely benefit greatly from changing to a pharmacy that checks in at least once monthly.  All morning meds at the same time Synjardy 10am, 5pm Tylenol 100056mwice daily Cymbalta causes somnolence, it's effective Lyrica is very  effective, Interested in dose increase Takes other night meds at 10pm.  Ativan typically 1 - 2 daily, sometimes zero Avoids big crowds Needs delivery soon, about 2 weeks  Plan Verbal consent obtained for [facility] enhanced pharmacy services (medication synchronization, adherence packaging, delivery coordination). A medication sync plan was created to allow patient  to get all medications delivered once every 30 to 90 days per patient preference. Patient understands they have freedom to choose pharmacy and clinical pharmacist will coordinate care between all prescribers and [facility].  Utilize [facility] for medication synchronization, packaging and delivery  Follow up: 3 month phone visit  Milus Height, PharmD, [place], [facility] [phone]

## 2020-05-14 NOTE — Progress Notes (Signed)
Chronic Care Management Pharmacy Assistant   Name: Bryan Flowers  MRN: [id number] DOB: 07-25-1961  Reason for Encounter: Medication Review  Patient Questions:  1.  Have you seen any other providers since your last visit? No  2.  Any changes in your medicines or health? No   Bryan Flowers,  59 y.o. , male presents for their Initial CCM visit with the clinical pharmacist via telephone.  PCP : [name], PA-C  Allergies:  No Known Allergies  Medications: Outpatient Encounter Medications as of 05/14/2020  Medication Sig  . acetaminophen (TYLENOL) 500 MG tablet Take 1,000 mg by mouth every 6 (six) hours as needed (pain).  Marland Kitchen amLODipine (NORVASC) 5 MG tablet Take 1 tablet (5 mg total) by mouth daily. (Patient not taking: Reported on 05/14/2020)  . Armodafinil 250 MG tablet Take by mouth in the morning.   Marland Kitchen b complex vitamins capsule Take 1 capsule by mouth daily.  . carbidopa-levodopa (SINEMET CR) 50-200 MG per tablet Take 1 tablet by mouth 3 (three) times daily.   . cholecalciferol (VITAMIN D3) 25 MCG (1000 UT) tablet Take 1,000 Units by mouth daily.  . DULoxetine (CYMBALTA) 60 MG capsule Take 120 mg by mouth at bedtime.  . Empagliflozin-metFORMIN HCl (SYNJARDY) 12.02-999 MG TABS Take 1 tablet by mouth 2 (two) times daily at 10 AM and 5 PM.  . fluticasone (FLONASE) 50 MCG/ACT nasal spray Place 2 sprays into both nostrils daily. (Patient not taking: Reported on 03/21/2020)  . glucose blood test strip Check once daily fasting, and up to 3 times a day as needed.  . Insulin Degludec-Liraglutide (XULTOPHY) 100-3.6 UNIT-MG/ML SOPN Inject 50 Units into the skin daily.  . Insulin Pen Needle (NOVOFINE) 32G X 6 MM MISC 1 each by Does not apply route daily.  Marland Kitchen lamoTRIgine (LAMICTAL) 200 MG tablet Take 400 mg by mouth at bedtime.   Marland Kitchen LORazepam (ATIVAN) 1 MG tablet Typically takes  1 to 2 times daily  . pregabalin (LYRICA) 50 MG capsule Take 1 capsule (50 mg total) by mouth 2 (two) times  daily. TAKE ONE CAPSULE BY MOUTH AT BEDTIME  . propranolol ER (INDERAL LA) 60 MG 24 hr capsule Take 60 mg by mouth at bedtime.   Marland Kitchen QUEtiapine (SEROQUEL XR) 400 MG 24 hr tablet TK 1 T PO QD AT 8PM ONE HOUR BEFORE OR AFTER A MEAL  . quinapril (ACCUPRIL) 20 MG tablet Take 1 tablet (20 mg total) by mouth at bedtime.  Marland Kitchen rOPINIRole (REQUIP) 3 MG tablet Take by mouth at bedtime.   . rosuvastatin (CRESTOR) 20 MG tablet Take 1 tablet (20 mg total) by mouth at bedtime.  . Vitamin D, Ergocalciferol, (DRISDOL) 1.25 MG (50000 UNIT) CAPS capsule Take 1 capsule (50,000 Units total) by mouth every 7 (seven) days. (Patient not taking: Reported on 05/14/2020)   No facility-administered encounter medications on file as of 05/14/2020.    Current Diagnosis: Patient Active Problem List   Diagnosis Date Noted  . Morbid obesity ([place]) 02/16/2019  . Pure hypercholesterolemia 02/16/2019  . Grief 02/16/2019  . Major depression, recurrent ([place]) 03/14/2018  . Bipolar 1 disorder, manic, moderate ([place])   . Vitamin D deficiency 09/10/2017  . Gastroesophageal reflux disease without esophagitis 09/10/2017  . History of iron deficiency anemia 03/30/2016  . History of iron deficiency anemia 03/24/2016  . History of GI bleed 03/15/2016  . Hyponatremia 03/15/2016  . Parkinson disease ([place])   . Hypertension   . Anxiety 08/19/2015  . Abnormal  ECG 04/09/2015  . Back ache 04/09/2015  . Diabetes mellitus type 2 in obese ([place]) 04/09/2015  . Obstructive sleep apnea 04/09/2015  . HDL deficiency 04/09/2015  . Hearing loss, sensorineural, combined types 04/09/2015  . HLD (hyperlipidemia) 04/09/2015  . Vocal cord atrophy 05/29/2014  . Fibromyalgia 03/15/2014  . Testicular hypofunction 05/17/2009  . Migraine with aura 05/14/2009    Goals Addressed   None     Follow-Up:  Coordination of Enhanced Pharmacy Services   Rosendo Gros, [place] Pharmacist Assistant  [phone]

## 2020-05-16 NOTE — Patient Instructions (Addendum)
Visit Information  Goals Addressed            This Visit's Progress   . Chronic Care Management       CARE PLAN ENTRY (see longitudinal plan of care for additional care plan information)  Current Barriers:  . Chronic Disease Management support, education, and care coordination needs related to Hypertension, Hyperlipidemia, Diabetes, and Depression   Hypertension BP Readings from Last 3 Encounters:  03/21/20 124/82  10/16/19 (!) 148/88  10/16/19 (!) 146/88   . Pharmacist Clinical Goal(s): o Over the next 90 days, patient will work with PharmD and providers to maintain BP goal <130/80 . Current regimen:  o Quinapril 20mg  at bedtime . Interventions: o None . Patient self care activities - Over the next 90 days, patient will: o Check BP if possible, document, and provide at future appointments o Ensure daily salt intake < 2300 mg/day  Hyperlipidemia Lab Results  Component Value Date/Time   [facility]  03/21/2020 11:04 AM     Comment:     . LDL cholesterol not calculated. Triglyceride levels greater than 400 mg/dL invalidate calculated LDL results. . Reference range: <100 . Desirable range <100 mg/dL for primary prevention;   <70 mg/dL for patients with CHD or diabetic patients  with > or = 2 CHD risk factors. Marland Kitchen LDL-C is now calculated using the Martin-Hopkins  calculation, which is a validated novel method providing  better accuracy than the Friedewald equation in the  estimation of LDL-C.  Cresenciano Genre et al. Annamaria Helling. 8250;539(76): 2061-2068  (http://education.[facility].com/faq/FAQ164)    . Pharmacist Clinical Goal(s): o Over the next 90 days, patient will work with PharmD and providers to achieve LDL goal < 100. TG too high for LDL reading. . Current regimen:  o Crestor 20mg  daily . Interventions: o Start fish oil 1g 2 caps by mouth twice daily . Patient self care activities - Over the next 90 days, patient will: o Take 4g total of fish oil daily o Reduce  intake of saturated fat such as fried food  Diabetes Lab Results  Component Value Date/Time   HGBA1C 12.0 (H) 03/21/2020 11:04 AM   HGBA1C 11.5 (H) 10/17/2019 01:25 PM   HGBA1C 10.5 (A) 11/14/2018 02:04 PM   HGBA1C 6.3 07/14/2018 01:25 PM   . Pharmacist Clinical Goal(s): o Over the next 90 days, patient will work with PharmD and providers to achieve A1c goal <7% . Current regimen:  o Synjardy 12.5mg -1000mg  10am and 5pm o Xultophy inject under the skin 50 units daily . Interventions: o Work with new pharmacy to make sure medications are available at all times . Patient self care activities - Over the next 90 days, patient will: o Check blood sugar once daily, document, and provide at future appointments o Contact provider with any episodes of hypoglycemia  Medication management . Pharmacist Clinical Goal(s): o Over the next 90 days, patient will work with PharmD and providers to achieve optimal medication adherence . Current pharmacy: [facility] . Interventions o Comprehensive medication review performed. o Utilize [facility] for medication synchronization, packaging and delivery . Patient self care activities - Over the next 90 days, patient will: o Focus on medication adherence by working with PharmD and new pharmacy to ensure full access to medication o Take medications as prescribed o Report any questions or concerns to PharmD and/or provider(s)  Initial goal documentation        Bryan Flowers was given information about Chronic Care Management services today including:  1. CCM service  includes personalized support from designated clinical staff supervised by his physician, including individualized plan of care and coordination with other care providers 2. 24/7 contact phone numbers for assistance for urgent and routine care needs. 3. Standard insurance, coinsurance, copays and deductibles apply for chronic care management only during months in which we provide at least  20 minutes of these services. Most insurances cover these services at 100%, however patients may be responsible for any copay, coinsurance and/or deductible if applicable. This service may help you avoid the need for more expensive face-to-face services. 4. Only one practitioner may furnish and bill the service in a calendar month. 5. The patient may stop CCM services at any time (effective at the end of the month) by phone call to the office staff.  Patient agreed to services and verbal consent obtained.   Print copy of patient instructions provided.  Telephone follow up appointment with pharmacy team member scheduled for: 3 months  Bryan Flowers, PharmD, [place], [facility] [phone]   High Cholesterol  High cholesterol is a condition in which the blood has high levels of a white, waxy, fat-like substance (cholesterol). The human body needs small amounts of cholesterol. The liver makes all the cholesterol that the body needs. Extra (excess) cholesterol comes from the food that we eat. Cholesterol is carried from the liver by the blood through the blood vessels. If you have high cholesterol, deposits (plaques) may build up on the walls of your blood vessels (arteries). Plaques make the arteries narrower and stiffer. Cholesterol plaques increase your risk for heart attack and stroke. Work with your health care provider to keep your cholesterol levels in a healthy range. What increases the risk? This condition is more likely to develop in people who:  Eat foods that are high in animal fat (saturated fat) or cholesterol.  Are overweight.  Are not getting enough exercise.  Have a family history of high cholesterol. What are the signs or symptoms? There are no symptoms of this condition. How is this diagnosed? This condition may be diagnosed from the results of a blood test.  If you are older than age 74, your health care provider may check your  cholesterol every 4-6 years.  You may be checked more often if you already have high cholesterol or other risk factors for heart disease. The blood test for cholesterol measures:  "Bad" cholesterol (LDL cholesterol). This is the main type of cholesterol that causes heart disease. The desired level for LDL is less than 100.  "Good" cholesterol (HDL cholesterol). This type helps to protect against heart disease by cleaning the arteries and carrying the LDL away. The desired level for HDL is 60 or higher.  Triglycerides. These are fats that the body can store or burn for energy. The desired number for triglycerides is lower than 150.  Total cholesterol. This is a measure of the total amount of cholesterol in your blood, including LDL cholesterol, HDL cholesterol, and triglycerides. A healthy number is less than 200. How is this treated? This condition is treated with diet changes, lifestyle changes, and medicines. Diet changes  This may include eating more whole grains, fruits, vegetables, nuts, and fish.  This may also include cutting back on red meat and foods that have a lot of added sugar. Lifestyle changes  Changes may include getting at least 40 minutes of aerobic exercise 3 times a week. Aerobic exercises include walking, biking, and swimming. Aerobic exercise along with a healthy diet can  help you maintain a healthy weight.  Changes may also include quitting smoking. Medicines  Medicines are usually given if diet and lifestyle changes have failed to reduce your cholesterol to healthy levels.  Your health care provider may prescribe a statin medicine. Statin medicines have been shown to reduce cholesterol, which can reduce the risk of heart disease. Follow these instructions at home: Eating and drinking If told by your health care provider:  Eat chicken (without skin), fish, veal, shellfish, ground [place] breast, and round or loin cuts of red meat.  Do not eat fried foods or  fatty meats, such as hot dogs and salami.  Eat plenty of fruits, such as apples.  Eat plenty of vegetables, such as broccoli, potatoes, and carrots.  Eat beans, peas, and lentils.  Eat grains such as barley, rice, couscous, and bulgur wheat.  Eat pasta without cream sauces.  Use skim or nonfat milk, and eat low-fat or nonfat yogurt and cheeses.  Do not eat or drink whole milk, cream, ice cream, egg yolks, or hard cheeses.  Do not eat stick margarine or tub margarines that contain trans fats (also called partially hydrogenated oils).  Do not eat saturated tropical oils, such as coconut oil and palm oil.  Do not eat cakes, cookies, crackers, or other baked goods that contain trans fats.  General instructions  Exercise as directed by your health care provider. Increase your activity level with activities such as gardening, walking, and taking the stairs.  Take over-the-counter and prescription medicines only as told by your health care provider.  Do not use any products that contain nicotine or tobacco, such as cigarettes and e-cigarettes. If you need help quitting, ask your health care provider.  Keep all follow-up visits as told by your health care provider. This is important. Contact a health care provider if:  You are struggling to maintain a healthy diet or weight.  You need help to start on an exercise program.  You need help to stop smoking. Get help right away if:  You have chest pain.  You have trouble breathing. This information is not intended to replace advice given to you by your health care provider. Make sure you discuss any questions you have with your health care provider. Document Revised: 09/24/2017 Document Reviewed: 03/21/2016 Elsevier Patient Education  [place].

## 2020-06-07 ENCOUNTER — Telehealth: Payer: Self-pay

## 2020-06-07 NOTE — Progress Notes (Signed)
New Patient Office Visit  Subjective:  Patient ID: Bryan Flowers, male    DOB: 12-19-1960  Age: 59 y.o. MRN: [id number]  CC: No chief complaint on file.   HPI [name] presents for telephone call Past Medical History:  Diagnosis Date  . Anemia    iron deficiency  . Anxiety   . Bipolar 1 disorder, manic, moderate ([place])   . Claudication ([place]) 07/16/2017  . Depression   . Diabetes mellitus without complication ([place])   . GERD (gastroesophageal reflux disease)   . Hyperlipidemia   . Hypertension   . Insomnia   . Parkinson disease ([place])   . Peptic ulcer disease with hemorrhage   . Sleep apnea     Past Surgical History:  Procedure Laterality Date  . ESOPHAGOGASTRODUODENOSCOPY Left 03/16/2016   Procedure: ESOPHAGOGASTRODUODENOSCOPY (EGD);  Surgeon: Manus Gunning, MD;  Location: [facility];  Service: Gastroenterology;  Laterality: Left;  . FOOT FRACTURE SURGERY Right     Family History  Problem Relation Age of Onset  . Cancer Mother   . Heart disease Father   . Arthritis Brother     Social History   Socioeconomic History  . Marital status: Widowed    Spouse name: Not on file  . Number of children: 0  . Years of education: Not on file  . Highest education level: High school graduate  Occupational History    Employer: DISABLED  Tobacco Use  . Smoking status: Never Smoker  . Smokeless tobacco: Never Used  . Tobacco comment: Non-smoker  Vaping Use  . Vaping Use: Never used  Substance and Sexual Activity  . Alcohol use: No    Alcohol/week: 0.0 standard drinks  . Drug use: No  . Sexual activity: Not Currently    Partners: Female  Other Topics Concern  . Not on file  Social History Narrative  . Not on file   Social Determinants of Health   Financial Resource Strain: Low Risk   . Difficulty of Paying Living Expenses: Not hard at all  Food Insecurity: No Food Insecurity  . Worried About Charity fundraiser in the Last Year: Never true  .  Ran Out of Food in the Last Year: Never true  Transportation Needs: No Transportation Needs  . Lack of Transportation (Medical): No  . Lack of Transportation (Non-Medical): No  Physical Activity: Sufficiently Active  . Days of Exercise per Week: 7 days  . Minutes of Exercise per Session: 30 min  Stress: No Stress Concern Present  . Feeling of Stress : Only a little  Social Connections: Moderately Integrated  . Frequency of Communication with Friends and Family: More than three times a week  . Frequency of Social Gatherings with Friends and Family: More than three times a week  . Attends Religious Services: More than 4 times per year  . Active Member of Clubs or Organizations: Yes  . Attends Archivist Meetings: More than 4 times per year  . Marital Status: Widowed  Intimate Partner Violence: Not At Risk  . Fear of Current or Ex-Partner: No  . Emotionally Abused: No  . Physically Abused: No  . Sexually Abused: No    ROS Review of Systems  Objective:   Today's Vitals: There were no vitals taken for this visit.  Physical Exam  Assessment & Plan:   Problem List Items Addressed This Visit    None      Outpatient Encounter Medications as of 06/07/2020  Medication Sig  .  acetaminophen (TYLENOL) 500 MG tablet Take 1,000 mg by mouth every 6 (six) hours as needed (pain).  Marland Kitchen amLODipine (NORVASC) 5 MG tablet Take 1 tablet (5 mg total) by mouth daily. (Patient not taking: Reported on 05/14/2020)  . Armodafinil 250 MG tablet Take by mouth in the morning.   Marland Kitchen b complex vitamins capsule Take 1 capsule by mouth daily.  . carbidopa-levodopa (SINEMET CR) 50-200 MG per tablet Take 1 tablet by mouth 3 (three) times daily.   . cholecalciferol (VITAMIN D3) 25 MCG (1000 UT) tablet Take 1,000 Units by mouth daily.  . DULoxetine (CYMBALTA) 60 MG capsule Take 120 mg by mouth at bedtime.  . Empagliflozin-metFORMIN HCl (SYNJARDY) 12.02-999 MG TABS Take 1 tablet by mouth 2 (two) times daily  at 10 AM and 5 PM.  . fluticasone (FLONASE) 50 MCG/ACT nasal spray Place 2 sprays into both nostrils daily. (Patient not taking: Reported on 03/21/2020)  . glucose blood test strip Check once daily fasting, and up to 3 times a day as needed.  . Insulin Degludec-Liraglutide (XULTOPHY) 100-3.6 UNIT-MG/ML SOPN Inject 50 Units into the skin daily.  . Insulin Pen Needle (NOVOFINE) 32G X 6 MM MISC 1 each by Does not apply route daily.  Marland Kitchen lamoTRIgine (LAMICTAL) 200 MG tablet Take 400 mg by mouth at bedtime.   Marland Kitchen LORazepam (ATIVAN) 1 MG tablet Typically takes  1 to 2 times daily  . pregabalin (LYRICA) 50 MG capsule Take 1 capsule (50 mg total) by mouth 2 (two) times daily. TAKE ONE CAPSULE BY MOUTH AT BEDTIME  . propranolol ER (INDERAL LA) 60 MG 24 hr capsule Take 60 mg by mouth at bedtime.   Marland Kitchen QUEtiapine (SEROQUEL XR) 400 MG 24 hr tablet TK 1 T PO QD AT 8PM ONE HOUR BEFORE OR AFTER A MEAL  . quinapril (ACCUPRIL) 20 MG tablet Take 1 tablet (20 mg total) by mouth at bedtime.  Marland Kitchen rOPINIRole (REQUIP) 3 MG tablet Take by mouth at bedtime.   . rosuvastatin (CRESTOR) 20 MG tablet Take 1 tablet (20 mg total) by mouth at bedtime.  . Vitamin D, Ergocalciferol, (DRISDOL) 1.25 MG (50000 UNIT) CAPS capsule Take 1 capsule (50,000 Units total) by mouth every 7 (seven) days. (Patient not taking: Reported on 05/14/2020)   No facility-administered encounter medications on file as of 06/07/2020.    Follow-up: No follow-ups on file.   [place], [place]

## 2020-06-07 NOTE — Telephone Encounter (Cosign Needed)
Coordination of medication on hand count and last fill dates

## 2020-06-07 NOTE — Chronic Care Management (AMB) (Signed)
Reviewed chart for medication changes ahead of medication onboard call. Patient would like to obtain medication through [facility] pharmacy packaging.  Medication list reviewed with current regimen, supply, and last fill dates.  Explanation of short fills in order to sync medications given.

## 2020-06-13 NOTE — Telephone Encounter (Signed)
Patient is calling to check on the delivery of his medication from  [facility] pharmacy packaging. Spoke to [employer]. Patient is awaiting a call back.Please advise CB- [phone]

## 2020-06-18 ENCOUNTER — Other Ambulatory Visit: Payer: Self-pay

## 2020-06-18 ENCOUNTER — Other Ambulatory Visit: Payer: Self-pay | Admitting: Family Medicine

## 2020-06-18 DIAGNOSIS — E119 Type 2 diabetes mellitus without complications: Secondary | ICD-10-CM

## 2020-06-18 DIAGNOSIS — M797 Fibromyalgia: Secondary | ICD-10-CM

## 2020-06-18 MED ORDER — GLUCOSE BLOOD VI STRP: ORAL_STRIP | 12 refills | Status: DC

## 2020-06-18 MED ORDER — PREGABALIN 50 MG PO CAPS: 50.0000 mg | ORAL_CAPSULE | Freq: Two times a day (BID) | ORAL | 0 refills | Status: DC

## 2020-06-18 NOTE — Telephone Encounter (Signed)
I apologize for not being more specific earlier - any of OUR medications managed in primary care we are happy to send to the pts new pharmacy of choice.  Those we have not been managing for years we will not start to manage or jump into managing without OV and chart review etc to see if it is appropriate  Pt has seen psychiatry in the past (located in [place]?) - they were managing Ativan and Nuvigil per controlled substance database - I cannot see what else they were managing except for what was previously reported at historical and reported from [facility] - last [place] 2019 (high dose seroquel and lamictal in the past needs to be f/up by specialists as well).  We do not manage those controlled substances from primary care His neuro/parkinsons meds would also be per specialists  I am happy to continue to help with DM, HTN, HLD and fibromyalgia meds.  I have seen the patient a few times while covering other providers and then his PCP left and I have seen him probably once since his PCP left the practice.  He was encouraged to come back due to poor med compliance and uncontrolled DM.  I have not seen him again since then.    DM meds needs appt  His cholesterol meds and HTN meds I will refill.  I sent lyrica in.  Everything else needs to wait for an appt so I have time to review pharmacy discussion/documentation, discuss with pt, review chart etc.    [name], PA-C

## 2020-06-18 NOTE — Telephone Encounter (Signed)
[place] patient has appointment this Friday can address then.

## 2020-06-18 NOTE — Telephone Encounter (Signed)
-----   Message from [facility], [place] sent at 06/18/2020 10:09 AM EDT ----- Regarding: Medication refill sent to pharmacy Good morning. I am Hollie. Ted Hancock's CMA. Can you please ask Leisa to send refills in for Ropinirole, Lyrica, Ativan, Lamictal, Glucose Test strips, Duloxetine, Carb-Levo, Armodafanil, and Quetiapine to [facility]? The patient is switching for compliance packaging and the pharmacy is waiting on refills in order to fill the prescriptions. Thank you.

## 2020-06-18 NOTE — Telephone Encounter (Signed)
I have sent a message to to [facility] ted's assistant r u going to refill all these?  It says historical provider? These 2 are the only one's we prescribe?

## 2020-06-18 NOTE — Telephone Encounter (Signed)
Im confused, I called and spoke to [facility] again she states you spoke to Dennis and agreed to refill all his meds? Even historical so I wil T up pt also has a f/u appt scheduled.

## 2020-06-18 NOTE — Addendum Note (Signed)
Addended by: Docia Furl on: 06/18/2020 01:33 PM   Modules accepted: Orders

## 2020-06-19 ENCOUNTER — Telehealth: Payer: Self-pay

## 2020-06-19 NOTE — Progress Notes (Signed)
[facility] reported this morning that patient was not wanting medication moved to [facility]. Patient had ran out of medication that he needed while converting to [facility] pharmacy and needed his medication.  Patient was afraid to wait any longer for medication. Pharmacy had been held up due to medications being reported as historical versus an actual provider or practice making it difficult to know who to ask for refills. Patient was given choice about keeping his medications at [facility] or continuing with [facility] switch. Patient really liked the convenience of the [facility] program and wanted to make conversion. Explained to patient how we could do a partial fill on some medications this month and with his help move all medications over to [facility] for next fill date. Patient was happy to do his part and be able to use [facility]. Patient will call [facility] back and ask them to proceed with sending refills to [facility] for future fills.

## 2020-06-20 ENCOUNTER — Other Ambulatory Visit: Payer: Self-pay

## 2020-06-20 MED ORDER — ONETOUCH VERIO W/DEVICE KIT: 1.0000 | PACK | Freq: Once | 0 refills | Status: AC

## 2020-06-20 MED ORDER — ONETOUCH VERIO VI STRP: ORAL_STRIP | 12 refills | Status: DC

## 2020-06-21 ENCOUNTER — Ambulatory Visit: Payer: PPO | Admitting: Family Medicine

## 2020-07-09 ENCOUNTER — Telehealth: Payer: Self-pay

## 2020-07-09 NOTE — Progress Notes (Signed)
Patient called on 10/5;10/6,10/7,10/8 Reviewed chart for medication changes ahead of medication coordination call.  Patient no showed to [facility] on 07/11/2020, they are unwilling to transfer psychiatric medications without patient permission or phone call from him. He has been made aware. Also aware of appointment on 07/16/2020 with [name]   BP Readings from Last 3 Encounters:  03/21/20 124/82  10/16/19 (!) 148/88  10/16/19 (!) 146/88    Lab Results  Component Value Date   HGBA1C 12.0 (H) 03/21/2020     Patient obtains medications through [facility] Packaging  30 Days   Last adherence delivery included: Amlodipine 5 mg tablet- Take 1 tablet by mouth daily in the morning Armodafinil 250 mg tablet- Take by mouth in the morning Carbidopa-Levodopa 50-200mg  table-Take 1 tablet 3 times daily Duloxetine0 60 mg capsule- take 2 capsules by mouth daily at bedtime Empagliflozin-metformin HCL 12.02-999 MG tabs-Take 1 tablet by mouth 2 times daily at 10 am and 5 pm. Fluticasone 12mcg/act nasal spray- Place 2 spray into both nostrils daily Glucose blood test strip Insulin Degludec-Liraglutide (XULTOPHY) 100-3.6 UNIT-MG/ML SOPN-Inject 50 Units into the skin daily Insulin Pen Needles 32Gx61mm Lamotrigine 200 mg tablet- Take 2 tablet by mouth daily at bedtime Lorazepam 1 mg- Take 1 to 2 times daily(V) prn Pregabalin 50 mg capsule-Take 1 capsule by mouth 2 times daily Propranolol ER 60 MG 24 hr capsule- Take 1 table by mouth at bedtime Quetiapine 400 MG 24 hr tablet-Take 1 tab at 8 pm one hour before or after a meal   quinapril (ACCUPRIL) 20 MG tablet-Take 1 table by mouth at bedtime ropinirole (REQUIP) 3 MG tablet-Take by mouth at bedtime rosuvastatin (CRESTOR) 20 MG tablet- Take 1 tablet at bedtime    Patient is due for next adherence delivery on: 07/17/2020 Called patient and reviewed medications and coordinated delivery.  This delivery to include:Amlodipine 5 mg tablet- Take 1  tablet by mouth daily in the morning Armodafinil 250 mg tablet- Take by mouth in the morning Carbidopa-Levodopa 50-200mg  table-Take 1 tablet 3 times daily Duloxetine0 60 mg capsule- take 2 capsules by mouth daily at bedtime Empagliflozin-metformin HCL 12.02-999 MG tabs-Take 1 tablet by mouth 2 times daily at 10 am and 5 pm. Fluticasone 47mcg/act nasal spray- Place 2 spray into both nostrils daily Glucose blood test strip Insulin Degludec-Liraglutide (XULTOPHY) 100-3.6 UNIT-MG/ML SOPN-Inject 50 Units into the skin daily Insulin Pen Needles 32Gx51mm Lamotrigine 200 mg tablet- Take 2 tablet by mouth daily at bedtime Lorazepam 1 mg- Take 1 to 2 times daily(V) prn Pregabalin 50 mg capsule-Take 1 capsule by mouth 2 times daily Propranolol ER 60 MG 24 hr capsule- Take 1 table by mouth at bedtime Quetiapine 400 MG 24 hr tablet-Take 1 tab at 8 pm one hour before or after a meal   quinapril (ACCUPRIL) 20 MG tablet-Take 1 table by mouth at bedtime ropinirole (REQUIP) 3 MG tablet-Take by mouth at bedtime rosuvastatin (CRESTOR) 20 MG tablet- Take 1 tablet at bedtime   Patient needs refills for Insulin Degludec-Liraglutide (XULTOPHY) 100-3.6 UNIT-MG/ML SOPN-Inject 50 Units into the skin daily,Fluticasone 21mcg/act nasal spray- Place 2 spray into both nostrils daily, Quetiapine 400 MG 24 hr tablet-Take 1 tab at 8 pm one hour before or after a meal    Confirmed delivery date of 07/17/2020, advised patient that pharmacy will contact them the morning of delivery.

## 2020-07-16 ENCOUNTER — Encounter: Payer: Self-pay | Admitting: Family Medicine

## 2020-07-16 ENCOUNTER — Ambulatory Visit (INDEPENDENT_AMBULATORY_CARE_PROVIDER_SITE_OTHER): Payer: PPO | Admitting: Family Medicine

## 2020-07-16 ENCOUNTER — Other Ambulatory Visit: Payer: Self-pay

## 2020-07-16 VITALS — BP 124/86 | HR 91 | Temp 97.8°F | Resp 16 | Ht 64.0 in | Wt 214.0 lb

## 2020-07-16 DIAGNOSIS — F339 Major depressive disorder, recurrent, unspecified: Secondary | ICD-10-CM

## 2020-07-16 DIAGNOSIS — Z794 Long term (current) use of insulin: Secondary | ICD-10-CM

## 2020-07-16 DIAGNOSIS — Z23 Encounter for immunization: Secondary | ICD-10-CM | POA: Diagnosis not present

## 2020-07-16 DIAGNOSIS — E1165 Type 2 diabetes mellitus with hyperglycemia: Secondary | ICD-10-CM

## 2020-07-16 DIAGNOSIS — I10 Essential (primary) hypertension: Secondary | ICD-10-CM | POA: Diagnosis not present

## 2020-07-16 DIAGNOSIS — G20 Parkinson's disease: Secondary | ICD-10-CM

## 2020-07-16 DIAGNOSIS — Z862 Personal history of diseases of the blood and blood-forming organs and certain disorders involving the immune mechanism: Secondary | ICD-10-CM

## 2020-07-16 DIAGNOSIS — F3112 Bipolar disorder, current episode manic without psychotic features, moderate: Secondary | ICD-10-CM | POA: Diagnosis not present

## 2020-07-16 DIAGNOSIS — IMO0002 Reserved for concepts with insufficient information to code with codable children: Secondary | ICD-10-CM

## 2020-07-16 DIAGNOSIS — E875 Hyperkalemia: Secondary | ICD-10-CM | POA: Diagnosis not present

## 2020-07-16 DIAGNOSIS — Z5181 Encounter for therapeutic drug level monitoring: Secondary | ICD-10-CM

## 2020-07-16 DIAGNOSIS — G4733 Obstructive sleep apnea (adult) (pediatric): Secondary | ICD-10-CM | POA: Diagnosis not present

## 2020-07-16 DIAGNOSIS — E559 Vitamin D deficiency, unspecified: Secondary | ICD-10-CM | POA: Diagnosis not present

## 2020-07-16 DIAGNOSIS — E7800 Pure hypercholesterolemia, unspecified: Secondary | ICD-10-CM

## 2020-07-16 DIAGNOSIS — M797 Fibromyalgia: Secondary | ICD-10-CM

## 2020-07-16 MED ORDER — SYNJARDY 12.5-1000 MG PO TABS: 1.0000 | ORAL_TABLET | Freq: Two times a day (BID) | ORAL | 1 refills | Status: DC

## 2020-07-16 MED ORDER — LIRAGLUTIDE 18 MG/3ML ~~LOC~~ SOPN: 1.8000 mg | PEN_INJECTOR | Freq: Every day | SUBCUTANEOUS | 2 refills | Status: DC

## 2020-07-16 MED ORDER — AMLODIPINE BESYLATE 5 MG PO TABS: 5.0000 mg | ORAL_TABLET | Freq: Every day | ORAL | 3 refills | Status: DC

## 2020-07-16 MED ORDER — INSULIN DEGLUDEC 100 UNIT/ML ~~LOC~~ SOPN: 50.0000 [IU] | PEN_INJECTOR | Freq: Every day | SUBCUTANEOUS | 2 refills | Status: DC

## 2020-07-16 MED ORDER — QUINAPRIL HCL 20 MG PO TABS: 20.0000 mg | ORAL_TABLET | Freq: Every day | ORAL | 1 refills | Status: DC

## 2020-07-16 MED ORDER — ROSUVASTATIN CALCIUM 20 MG PO TABS: 20.0000 mg | ORAL_TABLET | Freq: Every day | ORAL | 1 refills | Status: DC

## 2020-07-16 NOTE — Progress Notes (Signed)
Name: Bryan Flowers   MRN: [id number]    DOB: 09-25-1961   Date:07/16/2020       Progress Note  Chief Complaint  Patient presents with  . Follow-up  . Diabetes  . Hypertension  . Hyperlipidemia     Subjective:   Bryan Flowers is a 59 y.o. male, presents to clinic for routine f/up, uncontrolled IDDM, abnormal labs from June - pt lost to f/up   DM:   Pt managing DM with synjardy and xultophy - noncompliant and uncontrolled, referred to endocrinology many times, has not gone  Has been explained to him today that it needs specialists management, and if he does not go to endocrinology and is not compliant, he will not be able to continue to be seen here for PCP management.  Blood sugars fasting >200, they have gotten better over the past month with working on diet, they were over 300 a few months ago, pt was having urinary frequency, but for the past month that has improved - does not have meter with him Denies: Polyuria, polydipsia, vision changes, neuropathy, hypoglycemia Recent pertinent labs: Lab Results  Component Value Date   HGBA1C 12.0 (H) 03/21/2020   HGBA1C 11.5 (H) 10/17/2019   HGBA1C 10.0 (H) 09/12/2019   Standard of care and health maintenance: Urine Microalbumin:  Done 12/20 Foot exam:  Done 03/21/20 DM eye exam:  Due, Ordered last visit (June 2021) ACEI/ARB: yes Statin:  Yes   Hyperlipidemia: Currently treated with Crestor 20 mg - he reports good compliance Unable to calculate LDL due to high trigs- needs fasting labs Last Lipids: Lab Results  Component Value Date   CHOL 345 (H) 03/21/2020   HDL 42 03/21/2020   LDLCALC Unable to calculate 03/21/2020   TRIG 411 (H) 03/21/2020   CHOLHDL 8.2 (H) 03/21/2020   - Denies: Chest pain, shortness of breath, myalgias, claudication  Hypertension:  Currently managed on Amlodipine 5 mg quinalpril 20 mg  Pt reports good med compliance and denies any SE.   Blood pressure today is well controlled. BP Readings  from Last 3 Encounters:  07/16/20 124/86  03/21/20 124/82  10/16/19 (!) 148/88  Pt denies CP, SOB, exertional sx, LE edema, palpitation, Ha's, visual disturbances, lightheadedness, hypotension, syncope.  Parkinsons - managed by neuro/psych, he reports doing well on sinemet - his tremor is well controlled with propranolol He saw Dr. [name] in the past but reports was lost to f/up with COVID pandemic, he feels he has improved, but he would like to get referral and f/up appt to make sure he stays est with neuro specialist  Bipolar - manage by [name] in [place] - on sinemet, cymbalta, lamictal, ativan, lyrica, inderal, reoquel, requip  Obesity:  Working on healthy foods, he has lost weight over the past two years, some recent weight increase, no unintentional weight loss Wt Readings from Last 5 Encounters:  07/16/20 214 lb (97.1 kg)  03/21/20 202 lb 9.6 oz (91.9 kg)  10/16/19 221 lb (100.2 kg)  10/12/19 221 lb (100.2 kg)  05/26/19 231 lb (104.8 kg)   BMI Readings from Last 5 Encounters:  07/16/20 36.73 kg/m  03/21/20 29.07 kg/m  10/16/19 31.71 kg/m  10/12/19 30.82 kg/m  05/26/19 32.22 kg/m             Current Outpatient Medications:  .  acetaminophen (TYLENOL) 500 MG tablet, Take 1,000 mg by mouth every 6 (six) hours as needed (pain)., Disp: , Rfl:  .  amLODipine (NORVASC) 5  MG tablet, Take 1 tablet (5 mg total) by mouth daily., Disp: 90 tablet, Rfl: 3 .  Armodafinil 250 MG tablet, Take by mouth in the morning. , Disp: , Rfl:  .  b complex vitamins capsule, Take 1 capsule by mouth daily., Disp: , Rfl:  .  carbidopa-levodopa (SINEMET CR) 50-200 MG per tablet, Take 1 tablet by mouth 3 (three) times daily. , Disp: , Rfl:  .  cholecalciferol (VITAMIN D3) 25 MCG (1000 UT) tablet, Take 1,000 Units by mouth daily., Disp: , Rfl:  .  DULoxetine (CYMBALTA) 60 MG capsule, Take 120 mg by mouth at bedtime., Disp: , Rfl:  .  Empagliflozin-metFORMIN HCl (SYNJARDY) 12.02-999 MG  TABS, Take 1 tablet by mouth 2 (two) times daily with a meal., Disp: 180 tablet, Rfl: 1 .  glucose blood (ONETOUCH VERIO) test strip, DX:E11.69, LON:99 Check TID, Disp: 100 each, Rfl: 12 .  glucose blood test strip, Check once daily fasting, and up to 3 times a day as needed., Disp: 100 each, Rfl: 12 .  Insulin Pen Needle (NOVOFINE) 32G X 6 MM MISC, 1 each by Does not apply route daily., Disp: 100 each, Rfl: 3 .  lamoTRIgine (LAMICTAL) 200 MG tablet, Take 400 mg by mouth at bedtime. , Disp: , Rfl:  .  LORazepam (ATIVAN) 1 MG tablet, Typically takes  1 to 2 times daily, Disp: , Rfl:  .  pregabalin (LYRICA) 50 MG capsule, Take 1 capsule (50 mg total) by mouth 2 (two) times daily. TAKE ONE CAPSULE BY MOUTH AT BEDTIME, Disp: 60 capsule, Rfl: 0 .  propranolol ER (INDERAL LA) 60 MG 24 hr capsule, Take 60 mg by mouth at bedtime. , Disp: , Rfl:  .  QUEtiapine (SEROQUEL XR) 400 MG 24 hr tablet, TK 1 T PO QD AT 8PM ONE HOUR BEFORE OR AFTER A MEAL, Disp: , Rfl: 2 .  quinapril (ACCUPRIL) 20 MG tablet, Take 1 tablet (20 mg total) by mouth at bedtime., Disp: 90 tablet, Rfl: 1 .  rOPINIRole (REQUIP) 3 MG tablet, Take by mouth at bedtime. , Disp: , Rfl:  .  rosuvastatin (CRESTOR) 20 MG tablet, Take 1 tablet (20 mg total) by mouth at bedtime., Disp: 90 tablet, Rfl: 1 .  insulin degludec (TRESIBA) 100 UNIT/ML FlexTouch Pen, Inject 50-80 Units into the skin daily. Per MD/PA instruction, Disp: 15 mL, Rfl: 2 .  liraglutide (VICTOZA) 18 MG/3ML SOPN, Inject 1.8 mg into the skin daily., Disp: 9 mL, Rfl: 2  Patient Active Problem List   Diagnosis Date Noted  . Morbid obesity ([place]) 02/16/2019  . Pure hypercholesterolemia 02/16/2019  . Grief 02/16/2019  . Major depression, recurrent ([place]) 03/14/2018  . Bipolar 1 disorder, manic, moderate ([place])   . Vitamin D deficiency 09/10/2017  . Gastroesophageal reflux disease without esophagitis 09/10/2017  . History of iron deficiency anemia 03/30/2016  . History of iron  deficiency anemia 03/24/2016  . History of GI bleed 03/15/2016  . Hyponatremia 03/15/2016  . Parkinson disease ([place])   . Hypertension   . Anxiety 08/19/2015  . Abnormal ECG 04/09/2015  . Back ache 04/09/2015  . Diabetes mellitus type 2 in obese ([place]) 04/09/2015  . Obstructive sleep apnea 04/09/2015  . HDL deficiency 04/09/2015  . Hearing loss, sensorineural, combined types 04/09/2015  . HLD (hyperlipidemia) 04/09/2015  . Vocal cord atrophy 05/29/2014  . Fibromyalgia 03/15/2014  . Testicular hypofunction 05/17/2009  . Migraine with aura 05/14/2009    Past Surgical History:  Procedure Laterality Date  . ESOPHAGOGASTRODUODENOSCOPY  Left 03/16/2016   Procedure: ESOPHAGOGASTRODUODENOSCOPY (EGD);  Surgeon: Manus Gunning, MD;  Location: [facility];  Service: Gastroenterology;  Laterality: Left;  . FOOT FRACTURE SURGERY Right     Family History  Problem Relation Age of Onset  . Cancer Mother   . Heart disease Father   . Arthritis Brother     Social History   Tobacco Use  . Smoking status: Never Smoker  . Smokeless tobacco: Never Used  . Tobacco comment: Non-smoker  Vaping Use  . Vaping Use: Never used  Substance Use Topics  . Alcohol use: No    Alcohol/week: 0.0 standard drinks  . Drug use: No     No Known Allergies  Health Maintenance  Topic Date Due  . COVID-19 Vaccine (1) Never done  . OPHTHALMOLOGY EXAM  05/06/2018  . COLONOSCOPY  06/02/2019  . HEMOGLOBIN A1C  09/20/2020  . FOOT EXAM  03/21/2021  . TETANUS/TDAP  04/22/2021  . INFLUENZA VACCINE  Completed  . PNEUMOCOCCAL POLYSACCHARIDE VACCINE AGE 55-64 HIGH RISK  Completed  . Hepatitis C Screening  Completed  . HIV Screening  Completed    Chart Review Today: I personally reviewed active problem list, medication list, allergies, family history, social history, health maintenance, notes from last encounter, lab results, imaging with the patient/caregiver today.   Review of Systems  10 Systems  reviewed and are negative for acute change except as noted in the HPI.  Objective:   Vitals:   07/16/20 1515  BP: 124/86  Pulse: 91  Resp: 16  Temp: 97.8 F (36.6 C)  SpO2: 99%  Weight: 214 lb (97.1 kg)  Height: 5\' 4"  (1.626 m)    Body mass index is 36.73 kg/m.  Physical Exam Vitals and nursing note reviewed.  Constitutional:      General: He is not in acute distress.    Appearance: Normal appearance. He is not ill-appearing, toxic-appearing or diaphoretic.  HENT:     Head: Normocephalic and atraumatic.     Right Ear: External ear normal.     Left Ear: External ear normal.     Mouth/Throat:     Mouth: Mucous membranes are moist.  Eyes:     General: No scleral icterus.       Right eye: No discharge.        Left eye: No discharge.     Conjunctiva/sclera: Conjunctivae normal.  Cardiovascular:     Rate and Rhythm: Normal rate and regular rhythm.     Pulses: Normal pulses.     Heart sounds: Normal heart sounds.  Pulmonary:     Effort: Pulmonary effort is normal.     Breath sounds: Normal breath sounds.  Abdominal:     General: Bowel sounds are normal.     Palpations: Abdomen is soft.  Skin:    General: Skin is warm and dry.     Coloration: Skin is pale. Skin is not jaundiced.  Neurological:     Mental Status: He is alert. Mental status is at baseline.  Psychiatric:        Mood and Affect: Mood normal.        Behavior: Behavior normal.         Assessment & Plan:   1. Uncontrolled type 2 diabetes mellitus with hyperglycemia (HCC) Uncontrolled, at max dose of meds Continue synjardy D/c xultophy and start victoza daily same dose and start tresiba and titrate insulin dose to goal of fasting blood sugar between 100-150.  Educated pt on change in  medications and how to titrate Instructions on dose parameters and dose adjustments printed for pt on AVS and reviewed with him during visit Explained he will need to be compliant with medical advise and endocrinology  referral to get DM controlled or we will need to discharge him from the practice.  There have been multiple barriers to his care and he does seem very agreeable to working on med changes and seeing the specialists.  His phone was not working for a period of time and none of our calls nor prior referrals were going through to pt, but that has been fixed he states.  Increase insulin dose to 55 units daily to start Goal range for fasting blood sugars:  80-150 - keep insulin dose the same > 150 - Increase insulin dose by 2-5 units and do not adjust again for 3 days <  80 or under 100 and feeling symptomatic - decrease insulin dose by 2-3 units and do not adjust again for 3 days  Close f/up, pt instructed to have insulin dose and meter available for all f/up He is agreeable to talking with nurse specialists for help w/ DM education and insulin titration Endo referral entered again   * A1C was improving when labs resulted - will cancel endo referral for now, revisit in 3 months if any worseing  - Lipid panel - COMPLETE METABOLIC PANEL WITH GFR - Hemoglobin A1C - Empagliflozin-metFORMIN HCl (SYNJARDY) 12.02-999 MG TABS; Take 1 tablet by mouth 2 (two) times daily with a meal.  Dispense: 180 tablet; Refill: 1 - quinapril (ACCUPRIL) 20 MG tablet; Take 1 tablet (20 mg total) by mouth at bedtime.  Dispense: 90 tablet; Refill: 1 - rosuvastatin (CRESTOR) 20 MG tablet; Take 1 tablet (20 mg total) by mouth at bedtime.  Dispense: 90 tablet; Refill: 1 - liraglutide (VICTOZA) 18 MG/3ML SOPN; Inject 1.8 mg into the skin daily.  Dispense: 9 mL; Refill: 2 - insulin degludec (TRESIBA) 100 UNIT/ML FlexTouch Pen; Inject 50-80 Units into the skin daily. Per MD/PA instruction  Dispense: 15 mL; Refill: 2 - Ambulatory referral to Endocrinology - Ambulatory referral to Chronic Care Management Services  2. Insulin dependent type 2 diabetes mellitus, uncontrolled ([place]) See above - COMPLETE METABOLIC PANEL WITH GFR -  Hemoglobin A1C - Empagliflozin-metFORMIN HCl (SYNJARDY) 12.02-999 MG TABS; Take 1 tablet by mouth 2 (two) times daily with a meal.  Dispense: 180 tablet; Refill: 1 - liraglutide (VICTOZA) 18 MG/3ML SOPN; Inject 1.8 mg into the skin daily.  Dispense: 9 mL; Refill: 2 - insulin degludec (TRESIBA) 100 UNIT/ML FlexTouch Pen; Inject 50-80 Units into the skin daily. Per MD/PA instruction  Dispense: 15 mL; Refill: 2 iant with meds, no SE, no myalgias, fatigue or jaundice Due for FLP and recheck CMP  - Lipid panel - COMPLETE METABOLIC PANEL WITH GFR - rosuvastatin (CRESTOR) 20 MG tablet; Take 1 tablet (20 mg total) by mouth at bedtime.  Dispense: 90 tablet; Refill: 1  4. Essential hypertension Well controlled, BP at goal today on norvasc and quinapril, no Sx or concerns at this time, for now continue meds and DASH - COMPLETE METABOLIC PANEL WITH GFR - amLODipine (NORVASC) 5 MG tablet; Take 1 tablet (5 mg total) by mouth daily.  Dispense: 90 tablet; Refill: 3 - quinapril (ACCUPRIL) 20 MG tablet; Take 1 tablet (20 mg total) by mouth at bedtime.  Dispense: 90 tablet; Refill: 1  5. Hyperkalemia Last potassium high, pt did not come back for repeat labs or f/up as instructed.  He did state he cut out bananas and was careful about potassium rich foods in his diet.  I explained that hyperkalemia can cause sx, morbidity, mortality - cardiac changes/arrhythmias - and explained that we really need to recheck labs as previously advised to make sure it returns to normal, or if remains elevated or worsens - that we can treat it or possibly change meds or refer to nephrology if needed - he verbalized understanding  - COMPLETE METABOLIC PANEL WITH GFR  6. Morbid obesity ([place]) Encouraged continued diet/lifestyle efforts  7. Vitamin D deficiency He is on OTC supplement, but doesn't know current dose - very low in the past, recheck - COMPLETE METABOLIC PANEL WITH GFR - VITAMIN D 25 Hydroxy (Vit-D Deficiency,  Fractures)  8. History of iron deficiency anemia - CBC with Differential/Platelet  9. Encounter for medication monitoring - Lipid panel - COMPLETE METABOLIC PANEL WITH GFR - Hemoglobin A1C - CBC with Differential/Platelet - VITAMIN D 25 Hydroxy (Vit-D Deficiency, Fractures)  10. Need for influenza vaccination - Flu Vaccine QUAD 6+ mos PF IM (Fluarix Quad PF)  11. Parkinson disease ([place]) Pt reports improving with sinemet- he was lost to f/up with Neurology, Dr. [name], would like to get f/up visit with neuro Psych has been managing most meds previously managed or consulted on by neurology - last visit 2019 - pt states lost to f/up due to [place] - Ambulatory referral to Neurology  12. Obstructive sleep apnea Would like neuro to help f/up on OSA and CPAP needs - Ambulatory referral to Neurology  13. Bipolar 1 disorder, manic, moderate ([place]) Per psych - meds and controlled substance database reviewed Pt sees [name] twice a year, next routine f/up visit is in the next couple weeks.  14. Recurrent major depressive disorder, remission status unspecified ([place]) Per psych   15. Fibromyalgia Per specialists, currently well controlled, no recent flare, no complaints of pain today  Return for 2-3 week telephone f/up for DM/insulin, and 3 month routine f/up.   [name], PA-C 07/16/20 4:10 PM

## 2020-07-16 NOTE — Patient Instructions (Addendum)
You will need to see endocrinology to help manage your diabetes.  We have put in referral many times.  I will try again to get you into endocrinology with [facility] or [facility].  If you do not go to the specialist, then we will no longer be able to care for you here and you will be discharged from the practice.       Long Acting Insulin:  You will start insulin degludec or tresiba (same med as Xultophy) - but we will increase the dose and you will take victoza 1.8 mg injection daily separate   Start insulin 55 units once daily (at bedtime)  Begin checking your fasting blood sugar every morning - keep record on log or make sure always bring meter with you to appointments.  Goal range for fasting blood sugars:  80-150 - keep insulin dose the same    >150 - Increase insulin dose by 2-5 units and do not adjust again for 3 days    <80 or under 100 and feeling symptomatic - decrease insulin dose by 2-3 units and do not adjust again for 3 days  If your blood sugar in the next week is still over 150, you can increase the dose again to 60 units and then monitor for another 3-7 days.  Please reach out to me and let me know when you get to fasting blood sugars in goal range and what dose. We will record the dose in your chart, and keep you at that dose.  Follow up in 2-4 weeks (virtual ok if labs do not need to be rechecked in person) Follow up sooner if blood sugar too high or too low. Bring blood sugar log or glucometer with you to office visit.   Referral to Neurology to follow up with Dr. [name]  Referral to Endocrinology for diabetes management

## 2020-07-17 ENCOUNTER — Other Ambulatory Visit: Payer: Self-pay | Admitting: Family Medicine

## 2020-07-17 ENCOUNTER — Telehealth: Payer: Self-pay | Admitting: *Deleted

## 2020-07-17 DIAGNOSIS — E1165 Type 2 diabetes mellitus with hyperglycemia: Secondary | ICD-10-CM

## 2020-07-17 DIAGNOSIS — E7800 Pure hypercholesterolemia, unspecified: Secondary | ICD-10-CM

## 2020-07-17 LAB — COMPLETE METABOLIC PANEL WITH GFR
AG Ratio: 1.9 (calc) (ref 1.0–2.5)
ALT: 16 U/L (ref 9–46)
AST: 12 U/L (ref 10–35)
Albumin: 4.3 g/dL (ref 3.6–5.1)
Alkaline phosphatase (APISO): 55 U/L (ref 35–144)
BUN: 16 mg/dL (ref 7–25)
CO2: 25 mmol/L (ref 20–32)
Calcium: 9.1 mg/dL (ref 8.6–10.3)
Chloride: 104 mmol/L (ref 98–110)
Creat: 0.87 mg/dL (ref 0.70–1.33)
GFR, Est African American: 110 mL/min/{1.73_m2} (ref >=60)
GFR, Est Non African American: 95 mL/min/{1.73_m2} (ref >=60)
Globulin: 2.3 g/dL (calc) (ref 1.9–3.7)
Glucose, Bld: 116 mg/dL — ABNORMAL HIGH (ref 65–99)
Potassium: 4.5 mmol/L (ref 3.5–5.3)
Sodium: 138 mmol/L (ref 135–146)
Total Bilirubin: 0.4 mg/dL (ref 0.2–1.2)
Total Protein: 6.6 g/dL (ref 6.1–8.1)

## 2020-07-17 LAB — CBC WITH DIFFERENTIAL/PLATELET
Absolute Monocytes: 559 cells/uL (ref 200–950)
Basophils Absolute: 39 cells/uL (ref 0–200)
Basophils Relative: 0.6 %
Eosinophils Absolute: 91 cells/uL (ref 15–500)
Eosinophils Relative: 1.4 %
HCT: 41 % (ref 38.5–50.0)
Hemoglobin: 14 g/dL (ref 13.2–17.1)
Lymphs Abs: 1352 cells/uL (ref 850–3900)
MCH: 29.6 pg (ref 27.0–33.0)
MCHC: 34.1 g/dL (ref 32.0–36.0)
MCV: 86.7 fL (ref 80.0–100.0)
MPV: 10.3 fL (ref 7.5–12.5)
Monocytes Relative: 8.6 %
Neutro Abs: 4459 cells/uL (ref 1500–7800)
Neutrophils Relative %: 68.6 %
Platelets: 258 10*3/uL (ref 140–400)
RBC: 4.73 10*6/uL (ref 4.20–5.80)
RDW: 13.8 % (ref 11.0–15.0)
Total Lymphocyte: 20.8 %
WBC: 6.5 10*3/uL (ref 3.8–10.8)

## 2020-07-17 LAB — HEMOGLOBIN A1C
Hgb A1c MFr Bld: 8.2 % of total Hgb — ABNORMAL HIGH (ref <5.7)
Mean Plasma Glucose: 189 (calc)
eAG (mmol/L): 10.4 (calc)

## 2020-07-17 LAB — VITAMIN D 25 HYDROXY (VIT D DEFICIENCY, FRACTURES): Vit D, 25-Hydroxy: 22 ng/mL — ABNORMAL LOW (ref 30–100)

## 2020-07-17 LAB — LIPID PANEL
Cholesterol: 243 mg/dL — ABNORMAL HIGH (ref <200)
HDL: 44 mg/dL (ref >=40)
LDL Cholesterol (Calc): 158 mg/dL (calc) — ABNORMAL HIGH
Non-HDL Cholesterol (Calc): 199 mg/dL (calc) — ABNORMAL HIGH (ref <130)
Total CHOL/HDL Ratio: 5.5 (calc) — ABNORMAL HIGH (ref <5.0)
Triglycerides: 242 mg/dL — ABNORMAL HIGH (ref <150)

## 2020-07-17 MED ORDER — ROSUVASTATIN CALCIUM 40 MG PO TABS: 40.0000 mg | ORAL_TABLET | Freq: Every day | ORAL | 1 refills | Status: DC

## 2020-07-17 NOTE — Chronic Care Management (AMB) (Signed)
  Chronic Care Management   Outreach Note  07/17/2020 Name: Bryan Flowers MRN: [id number] DOB: 1961/08/26  Bryan Flowers is a 59 y.o. year old male who is a primary care patient of [name], [place]. I reached out to Bryan Flowers by phone today in response to a referral sent by Mr. Jacek Colson Alonso's PCP, [name], PA-C.     An unsuccessful telephone outreach was attempted today. The patient was referred to the case management team for assistance with care management and care coordination.   Follow Up Plan: A HIPAA compliant phone message was left for the patient providing contact information and requesting a return call. The care management team will reach out to the patient again over the next 7 days. If patient returns call to provider office, please advise to call [place] at [phone].  [facility]

## 2020-07-17 NOTE — Progress Notes (Signed)
HLD:  Lab Results  Component Value Date   CHOL 243 (H) 07/16/2020   CHOL 345 (H) 03/21/2020   CHOL 280 (H) 09/12/2019   Lab Results  Component Value Date   HDL 44 07/16/2020   HDL 42 03/21/2020   HDL 50 09/12/2019   Lab Results  Component Value Date   LDLCALC 158 (H) 07/16/2020   [place] Not able to calculate 03/21/2020             LDLCALC 167 (H) 09/12/2019   Lab Results  Component Value Date   TRIG 242 (H) 07/16/2020   TRIG 411 (H) 03/21/2020   TRIG 374 (H) 09/12/2019   Pt on crestor 20 mg - LDL still very high and not at goal  Options to add zetia vs increase crestor dose - pt said to go ahead with increased dose   Crestor 40 mg sent in  He has 3 month f/up, will see if LDL improves and monitor pts tolerance  Meds ordered this encounter  Medications  . rosuvastatin (CRESTOR) 40 MG tablet    Sig: Take 1 tablet (40 mg total) by mouth at bedtime.    Dispense:  90 tablet    Refill:  1    Dose change, d/c crestor 20 mg    [name], PA-C

## 2020-07-24 NOTE — Chronic Care Management (AMB) (Signed)
  Chronic Care Management   Note  07/24/2020 Name: KASIN TONKINSON MRN: [id number] DOB: 07-23-1961  [name] is a 59 y.o. year old male who is a primary care patient of [name], [place]. I reached out to [name] by phone today in response to a referral sent by Mr. Khalen Styer [place]'s PCP, Verline Lema, PA-C.     Mr. Engram was given information about Chronic Care Management services today including:  1. CCM service includes personalized support from designated clinical staff supervised by his physician, including individualized plan of care and coordination with other care providers 2. 24/7 contact phone numbers for assistance for urgent and routine care needs. 3. Service will only be billed when office clinical staff spend 20 minutes or more in a month to coordinate care. 4. Only one practitioner may furnish and bill the service in a calendar month. 5. The patient may stop CCM services at any time (effective at the end of the month) by phone call to the office staff. 6. The patient will be responsible for cost sharing (co-pay) of up to 20% of the service fee (after annual deductible is met).  Patient agreed to services and verbal consent obtained.   Follow up plan: Telephone appointment with care management team member scheduled for:07/29/2020  [facility]

## 2020-07-29 ENCOUNTER — Telehealth: Payer: PPO

## 2020-07-29 ENCOUNTER — Telehealth: Payer: Self-pay

## 2020-07-29 NOTE — Telephone Encounter (Signed)
°  Chronic Care Management   Outreach Note  07/29/2020 Name: Bryan Flowers MRN: [id number] DOB: 07/05/1961  Primary Care Provider: [name], PA-C Reason for referral : Chronic Care Management   An unsuccessful telephone outreach was attempted today. Bryan Flowers was referred to the case management team for assistance with care management and care coordination.   Unable to leave a voice message today due to Bryan Flowers voice mailbox not being set up.    PLAN A member of the care management team will reach out to Bryan Flowers again within the next two weeks.    [facility] Care Management [facility] [phone]

## 2020-07-30 ENCOUNTER — Telehealth: Payer: Self-pay | Admitting: Pharmacist

## 2020-07-30 NOTE — Progress Notes (Signed)
Chronic Care Management Pharmacy Assistant   Name: Bryan Flowers  MRN: [id number] DOB: 11-Feb-1961  Reason for Encounter: Medication Review  Patient Questions:  1.  Have you seen any other providers since your last visit? Yes, Bryan Flowers  2.  Any changes in your medicines or health? Yes, Crestor increase; Victoza start   Bryan Flowers,  59 y.o. , male presents for their Follow-Up CCM visit with the clinical pharmacist via telephone.  PCP : Bryan Grana, PA-C  Allergies:  No Known Allergies  Medications: Outpatient Encounter Medications as of 07/30/2020  Medication Sig  . acetaminophen (TYLENOL) 500 MG tablet Take 1,000 mg by mouth every 6 (six) hours as needed (pain).  Marland Kitchen amLODipine (NORVASC) 5 MG tablet Take 1 tablet (5 mg total) by mouth daily.  . Armodafinil 250 MG tablet Take by mouth in the morning.   Marland Kitchen b complex vitamins capsule Take 1 capsule by mouth daily.  . carbidopa-levodopa (SINEMET CR) 50-200 MG per tablet Take 1 tablet by mouth 3 (three) times daily.   . cholecalciferol (VITAMIN D3) 25 MCG (1000 UT) tablet Take 1,000 Units by mouth daily.  . DULoxetine (CYMBALTA) 60 MG capsule Take 120 mg by mouth at bedtime.  . Empagliflozin-metFORMIN HCl (SYNJARDY) 12.02-999 MG TABS Take 1 tablet by mouth 2 (two) times daily with a meal.  . glucose blood (ONETOUCH VERIO) test strip DX:E11.69, LON:99 Check TID  . glucose blood test strip Check once daily fasting, and up to 3 times a day as needed.  . insulin degludec (TRESIBA) 100 UNIT/ML FlexTouch Pen Inject 50-80 Units into the skin daily. Per MD/PA instruction  . Insulin Pen Needle (NOVOFINE) 32G X 6 MM MISC 1 each by Does not apply route daily.  Marland Kitchen lamoTRIgine (LAMICTAL) 200 MG tablet Take 400 mg by mouth at bedtime.   . liraglutide (VICTOZA) 18 MG/3ML SOPN Inject 1.8 mg into the skin daily.  Marland Kitchen LORazepam (ATIVAN) 1 MG tablet Typically takes  1 to 2 times daily  . pregabalin (LYRICA) 50 MG capsule Take 1 capsule (50 mg  total) by mouth 2 (two) times daily. TAKE ONE CAPSULE BY MOUTH AT BEDTIME  . propranolol ER (INDERAL LA) 60 MG 24 hr capsule Take 60 mg by mouth at bedtime.   Marland Kitchen QUEtiapine (SEROQUEL XR) 400 MG 24 hr tablet TK 1 T PO QD AT 8PM ONE HOUR BEFORE OR AFTER A MEAL  . quinapril (ACCUPRIL) 20 MG tablet Take 1 tablet (20 mg total) by mouth at bedtime.  Marland Kitchen rOPINIRole (REQUIP) 3 MG tablet Take by mouth at bedtime.   . rosuvastatin (CRESTOR) 40 MG tablet Take 1 tablet (40 mg total) by mouth at bedtime.   No facility-administered encounter medications on file as of 07/30/2020.    Current Diagnosis: Patient Active Problem List   Diagnosis Date Noted  . Morbid obesity ([place]) 02/16/2019  . Pure hypercholesterolemia 02/16/2019  . Grief 02/16/2019  . Major depression, recurrent ([place]) 03/14/2018  . Bipolar 1 disorder, manic, moderate ([place])   . Vitamin D deficiency 09/10/2017  . Gastroesophageal reflux disease without esophagitis 09/10/2017  . History of iron deficiency anemia 03/30/2016  . History of iron deficiency anemia 03/24/2016  . History of GI bleed 03/15/2016  . Hyponatremia 03/15/2016  . Parkinson disease ([place])   . Hypertension   . Anxiety 08/19/2015  . Abnormal ECG 04/09/2015  . Back ache 04/09/2015  . Diabetes mellitus type 2 in obese ([place]) 04/09/2015  . Obstructive sleep apnea 04/09/2015  .  HDL deficiency 04/09/2015  . Hearing loss, sensorineural, combined types 04/09/2015  . HLD (hyperlipidemia) 04/09/2015  . Vocal cord atrophy 05/29/2014  . Fibromyalgia 03/15/2014  . Testicular hypofunction 05/17/2009  . Migraine with aura 05/14/2009    Goals Addressed   None     Follow-Up:  Coordination of Enhanced Pharmacy Services    Reviewed chart and adherence measures. Per insurance data medication adherence for diabetic medication is 30-39% and he is not taking any statin medication. Communicated adherence issues with PharmD.

## 2020-08-07 ENCOUNTER — Telehealth: Payer: Self-pay

## 2020-08-07 NOTE — Telephone Encounter (Signed)
°  Chronic Care Management   Outreach Note  08/07/2020 Name: Bryan Flowers MRN: [id number] DOB: June 26, 1961  Primary Care Provider: [name], PA-C Reason for referral : Chronic Care Management   An unsuccessful telephone outreach was attempted today. [name] was referred to the case management team for assistance with care management and care coordination.    PLAN Unable to leave a voice message today d/t his mailbox not being set up. A member of the care management team will attempt to reach [name] again within the next two weeks.    [facility] Care Management [facility] [phone]

## 2020-08-08 ENCOUNTER — Telehealth (INDEPENDENT_AMBULATORY_CARE_PROVIDER_SITE_OTHER): Payer: PPO | Admitting: Family Medicine

## 2020-08-08 ENCOUNTER — Other Ambulatory Visit: Payer: Self-pay

## 2020-08-08 ENCOUNTER — Encounter: Payer: Self-pay | Admitting: Family Medicine

## 2020-08-08 DIAGNOSIS — E1165 Type 2 diabetes mellitus with hyperglycemia: Secondary | ICD-10-CM

## 2020-08-08 NOTE — Progress Notes (Signed)
Name: Bryan Flowers   MRN: [id number]    DOB: 1961/07/13   Date:08/08/2020       Progress Note  Subjective:    Chief Complaint  Chief Complaint  Patient presents with  . Diabetes    3 week follow up BS now 150-200    I connected with  Bryan Flowers on 08/08/20 at  2:00 PM EDT by telephone and verified that I am speaking with the correct person using two identifiers.   I discussed the limitations, risks, security and privacy concerns of performing an evaluation and management service by telephone and the availability of in person appointments. Staff also discussed with the patient that there may be a patient responsible charge related to this service.  Patient verbalized understanding and agreed to proceed with encounter. Patient Location: not at home Provider Location: [facility] Additional Individuals present: none  HPI  DM f/up  On synjardy - he states he has been taking victoza daily 1.8 mg daily injection - states he has been taking daily in the past 3 weeks tresiba 50 units  He was supposed to titrate Giving tresiba dose at bedtime Fasting sugars were 150-200 He says they are more so near 150's and seem to be getting better He states hes not home and not able to tell me any readings on glucometer Denies hypoglycemia He is inconsistent in giving history - states he was taking 50 and then later says he was taking 55 units  He has not started all his meds recently refilled and some he stated he was taking, then later stated he could not afford the med copay until he got paid yesterday. We will f/up with his pharmacy to detail last pick up his meds, compliance has waxed and waned  He today says hes not yet filled or taken the crestor - because I specifically asked him about it  Lab Results  Component Value Date   HGBA1C 8.2 (H) 07/16/2020   HGBA1C 12.0 (H) 03/21/2020   HGBA1C 11.5 (H) 10/17/2019    Written out and reviewed pt instructions and plan from last OV  on 10/12: Long Acting Insulin:  You will start insulin degludec or tresiba (same med as Film/video editor) - but we will increase the dose and you will take victoza 1.8 mg injection daily separate   Start insulin 55 units once daily (at bedtime)  Begin checking your fasting blood sugar every morning - keep record on log or make sure always bring meter with you to appointments.  Goal range for fasting blood sugars:  80-150 - keep insulin dose the same    >150 - Increase insulin dose by 2-5 units and do not adjust again for 3 days    <80 or under 100 and feeling symptomatic - decrease insulin dose by 2-3 units and do not adjust again for 3 days  If your blood sugar in the next week is still over 150, you can increase the dose again to 60 units and then monitor for another 3-7 days.  Please reach out to me and let me know when you get to fasting blood sugars in goal range and what dose. We will record the dose in your chart, and keep you at that dose.  Follow up in 2-4 weeks (virtual ok if labs do not need to be rechecked in person) Follow up sooner if blood sugar too high or too low. Bring blood sugar log or glucometer with you to office visit.  Patient Active Problem List   Diagnosis Date Noted  . Morbid obesity ([place]) 02/16/2019  . Pure hypercholesterolemia 02/16/2019  . Grief 02/16/2019  . Major depression, recurrent ([place]) 03/14/2018  . Bipolar 1 disorder, manic, moderate ([place])   . Vitamin D deficiency 09/10/2017  . Gastroesophageal reflux disease without esophagitis 09/10/2017  . History of iron deficiency anemia 03/30/2016  . History of iron deficiency anemia 03/24/2016  . History of GI bleed 03/15/2016  . Hyponatremia 03/15/2016  . Parkinson disease ([place])   . Hypertension   . Anxiety 08/19/2015  . Abnormal ECG 04/09/2015  . Back ache 04/09/2015  . Diabetes mellitus type 2 in obese ([place]) 04/09/2015  . Obstructive sleep apnea 04/09/2015  . HDL deficiency 04/09/2015   . Hearing loss, sensorineural, combined types 04/09/2015  . HLD (hyperlipidemia) 04/09/2015  . Vocal cord atrophy 05/29/2014  . Fibromyalgia 03/15/2014  . Testicular hypofunction 05/17/2009  . Migraine with aura 05/14/2009    Social History   Tobacco Use  . Smoking status: Never Smoker  . Smokeless tobacco: Never Used  . Tobacco comment: Non-smoker  Substance Use Topics  . Alcohol use: No    Alcohol/week: 0.0 standard drinks     Current Outpatient Medications:  .  acetaminophen (TYLENOL) 500 MG tablet, Take 1,000 mg by mouth every 6 (six) hours as needed (pain)., Disp: , Rfl:  .  amLODipine (NORVASC) 5 MG tablet, Take 1 tablet (5 mg total) by mouth daily., Disp: 90 tablet, Rfl: 3 .  Armodafinil 250 MG tablet, Take by mouth in the morning. , Disp: , Rfl:  .  b complex vitamins capsule, Take 1 capsule by mouth daily., Disp: , Rfl:  .  carbidopa-levodopa (SINEMET CR) 50-200 MG per tablet, Take 1 tablet by mouth 3 (three) times daily. , Disp: , Rfl:  .  cholecalciferol (VITAMIN D3) 25 MCG (1000 UT) tablet, Take 1,000 Units by mouth daily., Disp: , Rfl:  .  DULoxetine (CYMBALTA) 60 MG capsule, Take 120 mg by mouth at bedtime., Disp: , Rfl:  .  Empagliflozin-metFORMIN HCl (SYNJARDY) 12.02-999 MG TABS, Take 1 tablet by mouth 2 (two) times daily with a meal., Disp: 180 tablet, Rfl: 1 .  glucose blood (ONETOUCH VERIO) test strip, DX:E11.69, LON:99 Check TID, Disp: 100 each, Rfl: 12 .  glucose blood test strip, Check once daily fasting, and up to 3 times a day as needed., Disp: 100 each, Rfl: 12 .  insulin degludec (TRESIBA) 100 UNIT/ML FlexTouch Pen, Inject 50-80 Units into the skin daily. Per MD/PA instruction, Disp: 15 mL, Rfl: 2 .  Insulin Pen Needle (NOVOFINE) 32G X 6 MM MISC, 1 each by Does not apply route daily., Disp: 100 each, Rfl: 3 .  lamoTRIgine (LAMICTAL) 200 MG tablet, Take 400 mg by mouth at bedtime. , Disp: , Rfl:  .  liraglutide (VICTOZA) 18 MG/3ML SOPN, Inject 1.8 mg into  the skin daily., Disp: 9 mL, Rfl: 2 .  LORazepam (ATIVAN) 1 MG tablet, Typically takes  1 to 2 times daily, Disp: , Rfl:  .  pregabalin (LYRICA) 50 MG capsule, Take 1 capsule (50 mg total) by mouth 2 (two) times daily. TAKE ONE CAPSULE BY MOUTH AT BEDTIME, Disp: 60 capsule, Rfl: 0 .  propranolol ER (INDERAL LA) 60 MG 24 hr capsule, Take 60 mg by mouth at bedtime. , Disp: , Rfl:  .  QUEtiapine (SEROQUEL XR) 400 MG 24 hr tablet, TK 1 T PO QD AT 8PM ONE HOUR BEFORE OR AFTER A MEAL, Disp: ,  Rfl: 2 .  quinapril (ACCUPRIL) 20 MG tablet, Take 1 tablet (20 mg total) by mouth at bedtime., Disp: 90 tablet, Rfl: 1 .  rOPINIRole (REQUIP) 3 MG tablet, Take by mouth at bedtime. , Disp: , Rfl:  .  rosuvastatin (CRESTOR) 40 MG tablet, Take 1 tablet (40 mg total) by mouth at bedtime., Disp: 90 tablet, Rfl: 1  No Known Allergies  Chart Review: I personally reviewed active problem list, medication list, allergies, family history, social history, health maintenance, notes from last encounter, lab results, imaging with the patient/caregiver today.   Review of Systems 10 Systems reviewed and are negative for acute change except as noted in the HPI.   Objective:    Virtual encounter, vitals limited, only able to obtain the following There were no vitals filed for this visit. There is no height or weight on file to calculate BMI. Nursing Note and Vital Signs reviewed.  Physical Exam Pt alert, slowed verbal responses to questions, no audible distress PE limited by telephone encounter  No results found for this or any previous visit (from the past 72 hour(s)).  Assessment and Plan:     ICD-10-CM   1. Uncontrolled type 2 diabetes mellitus with hyperglycemia (HCC)  E11.65   Pt did not have meter or readings ready Giving confusing/conflicting answers regarding what medications and doses he is actually taking  Not at home and did not have info ready for visit  I told him I would call him back today at  the end of business day before 5 pm, to have glucometer and readings ready, have list of meds from pharmacy ready that he has or doesn't have and can't afford so I can review and help him with that.  I provided 30+ minutes of non-face-to-face time during this encounter.  [name], PA-C 08/08/20 2:27 PM

## 2020-08-09 ENCOUNTER — Telehealth: Payer: Self-pay | Admitting: *Deleted

## 2020-08-09 NOTE — Chronic Care Management (AMB) (Signed)
  Care Management   Note  08/09/2020 Name: Bryan Flowers MRN: [id number] DOB: September 26, 1961  Bryan Flowers is a 59 y.o. year old male who is a primary care patient of Laurell Roof and is actively engaged with the care management team. I reached out to [name] by phone today to assist with re-scheduling an initial visit with the RN Case Manager.  Follow up plan: Unsuccessful telephone outreach attempt made. The care management team will reach out to the patient again over the next 7 days. If patient returns call to provider office, please advise to call [place] at [facility]

## 2020-08-12 LAB — HM DIABETES EYE EXAM

## 2020-08-13 NOTE — Chronic Care Management (AMB) (Signed)
  Care Management   Note  08/13/2020 Name: ARMISTEAD SULT MRN: [id number] DOB: 10-Oct-1960  [name] is a 59 y.o. year old male who is a primary care patient of Laurell Roof and is actively engaged with the care management team. I reached out to [name] by phone today to assist with re-scheduling an initial visit with the RN Case Manager.  Follow up plan: Telephone appointment with care management team member scheduled for:08/16/2020  [facility]

## 2020-08-14 ENCOUNTER — Ambulatory Visit: Payer: Self-pay

## 2020-08-14 ENCOUNTER — Telehealth: Payer: Self-pay

## 2020-08-14 NOTE — Progress Notes (Signed)
Chronic Care Management Pharmacy Assistant   Name: Bryan Flowers  MRN: [id number] DOB: 1961-09-17  Reason for Encounter: Disease State  Patient Questions:  1.  Have you seen any other providers since your last visit? Yes, Bryan Flowers  2.  Any changes in your medicines or health? No   Bryan Flowers,  59 y.o. , male presents for their Follow-Up CCM visit with the clinical pharmacist via telephone.  PCP : Bryan Grana, PA-C  Allergies:  No Known Allergies  Medications: Outpatient Encounter Medications as of 08/14/2020  Medication Sig  . acetaminophen (TYLENOL) 500 MG tablet Take 1,000 mg by mouth every 6 (six) hours as needed (pain).  Marland Kitchen amLODipine (NORVASC) 5 MG tablet Take 1 tablet (5 mg total) by mouth daily.  . Armodafinil 250 MG tablet Take by mouth in the morning.   Marland Kitchen b complex vitamins capsule Take 1 capsule by mouth daily.  . carbidopa-levodopa (SINEMET CR) 50-200 MG per tablet Take 1 tablet by mouth 3 (three) times daily.   . cholecalciferol (VITAMIN D3) 25 MCG (1000 UT) tablet Take 1,000 Units by mouth daily.  . DULoxetine (CYMBALTA) 60 MG capsule Take 120 mg by mouth at bedtime.  . Empagliflozin-metFORMIN HCl (SYNJARDY) 12.02-999 MG TABS Take 1 tablet by mouth 2 (two) times daily with a meal.  . glucose blood (ONETOUCH VERIO) test strip DX:E11.69, LON:99 Check TID  . glucose blood test strip Check once daily fasting, and up to 3 times a day as needed.  . insulin degludec (TRESIBA) 100 UNIT/ML FlexTouch Pen Inject 50-80 Units into the skin daily. Per MD/PA instruction  . Insulin Pen Needle (NOVOFINE) 32G X 6 MM MISC 1 each by Does not apply route daily.  Marland Kitchen lamoTRIgine (LAMICTAL) 200 MG tablet Take 400 mg by mouth at bedtime.   . liraglutide (VICTOZA) 18 MG/3ML SOPN Inject 1.8 mg into the skin daily.  Marland Kitchen LORazepam (ATIVAN) 1 MG tablet Typically takes  1 to 2 times daily  . pregabalin (LYRICA) 50 MG capsule Take 1 capsule (50 mg total) by mouth 2 (two) times daily.  TAKE ONE CAPSULE BY MOUTH AT BEDTIME  . propranolol ER (INDERAL LA) 60 MG 24 hr capsule Take 60 mg by mouth at bedtime.   Marland Kitchen QUEtiapine (SEROQUEL XR) 400 MG 24 hr tablet TK 1 T PO QD AT 8PM ONE HOUR BEFORE OR AFTER A MEAL  . quinapril (ACCUPRIL) 20 MG tablet Take 1 tablet (20 mg total) by mouth at bedtime.  Marland Kitchen rOPINIRole (REQUIP) 3 MG tablet Take by mouth at bedtime.   . rosuvastatin (CRESTOR) 40 MG tablet Take 1 tablet (40 mg total) by mouth at bedtime.   No facility-administered encounter medications on file as of 08/14/2020.    Current Diagnosis: Patient Active Problem List   Diagnosis Date Noted  . Morbid obesity ([place]) 02/16/2019  . Pure hypercholesterolemia 02/16/2019  . Grief 02/16/2019  . Major depression, recurrent ([place]) 03/14/2018  . Bipolar 1 disorder, manic, moderate ([place])   . Vitamin D deficiency 09/10/2017  . Gastroesophageal reflux disease without esophagitis 09/10/2017  . History of iron deficiency anemia 03/30/2016  . History of iron deficiency anemia 03/24/2016  . History of GI bleed 03/15/2016  . Hyponatremia 03/15/2016  . Parkinson disease ([place])   . Hypertension   . Anxiety 08/19/2015  . Abnormal ECG 04/09/2015  . Back ache 04/09/2015  . Diabetes mellitus type 2 in obese ([place]) 04/09/2015  . Obstructive sleep apnea 04/09/2015  . HDL deficiency 04/09/2015  .  Hearing loss, sensorineural, combined types 04/09/2015  . HLD (hyperlipidemia) 04/09/2015  . Vocal cord atrophy 05/29/2014  . Fibromyalgia 03/15/2014  . Testicular hypofunction 05/17/2009  . Migraine with aura 05/14/2009    Goals Addressed   None     Follow-Up:  Coordination of Enhanced Pharmacy Services   .Marland Kitchen Recent Relevant Labs: Lab Results  Component Value Date/Time   HGBA1C 8.2 (H) 07/16/2020 04:01 PM   HGBA1C 12.0 (H) 03/21/2020 11:04 AM   HGBA1C 10.5 (A) 11/14/2018 02:04 PM   HGBA1C 6.3 07/14/2018 01:25 PM   MICROALBUR 1.2 09/12/2019 12:00 AM   MICROALBUR 20 03/14/2018 02:22 PM    MICROALBUR <0.2 07/02/2017 08:11 AM   MICROALBUR NEG 03/12/2016 03:16 PM    Kidney Function Lab Results  Component Value Date/Time   CREATININE 0.87 07/16/2020 04:01 PM   CREATININE 0.92 03/21/2020 11:04 AM   GFRNONAA 95 07/16/2020 04:01 PM   GFRAA 110 07/16/2020 04:01 PM    . Current antihyperglycemic regimen: -Empagliflozin-metFORMIN HCl (SYNJARDY) 12.02-999 MG TABS  -liraglutide (VICTOZA) 18 MG/3ML SOPN; Inject 1.8 mg into the skin daily -insulin degludec (TRESIBA) 100 UNIT/ML FlexTouch Pen dose to 55 units daily to start Goal range for fasting blood sugars:  80-150 - keep insulin dose the same > 150 - Increase insulin dose by 2-5 units and do not adjust again for 3 days <  80 or under 100 and feeling symptomatic - decrease insulin dose by 2-3 units and do not adjust again for 3 days  . What recent interventions/DTPs have been made to improve glycemic control:  o None- Per Leisa Tapia/insurance adherence report compliance seems to be a major issue. Pt is very poor historian. Affordability of medication a concern . Have there been any recent hospitalizations or ED visits since last visit with CPP? No . Patient denies hypoglycemic symptoms, including None . Patient denies hyperglycemic symptoms, including none . How often are you checking your blood sugar? once daily . What are your blood sugars ranging?  o Fasting: 150 range per pt o Before meals: not checking o After meals: not checking o Bedtime: not checking . During the week, how often does your blood glucose drop below 70? Never Are you checking your feet daily/regularly? yes Adherence Review: Is the patient currently on a STATIN medication? Yes Is the patient currently on ACE/ARB medication? Yes Does the patient have >5 day gap between last estimated fill dates? Yes   Compliance with medication continues to be a major issue. Barriers include affordability and patient's understanding of regimen. He is suppose to f/u  with endocrinology needs help with setting appointment up. Reviewed proper eating habits with pt and encouraged him to check blood glucose more often.    Pt states that he is getting all medication however adherence is different than patients report. Discussed findings with Junius Argyle CCM pharmacist.   Reviewed with patient signs and symptoms of hypoglycemia and hyperglycemia.  Also reviewed emergency management during blood glucose event. Offered pt handout on proper diet. Pt declined.

## 2020-08-16 ENCOUNTER — Ambulatory Visit: Payer: PPO

## 2020-08-16 NOTE — Chronic Care Management (AMB) (Signed)
° ° °  Chronic Care Management   Outreach Note  08/19/2020 Name: Bryan Flowers MRN: [id number] DOB: Mar 25, 1961  Primary Care Provider: [name], PA-C Reason for referral : Chronic Care Management   Bryan Flowers was referred to the case management team for assistance with care management and care coordination. He confirmed availability for the initial assessment on 08/26/20. Contact information provided.  Encouraged to call with health related concerns if needed prior to the scheduled outreach.    Follow Up Plan A member of the care management team will follow-up on 08/26/20.    Bryan Flowers [facility] Care Management [facility] [phone]

## 2020-08-21 ENCOUNTER — Encounter (INDEPENDENT_AMBULATORY_CARE_PROVIDER_SITE_OTHER): Payer: PPO | Admitting: Ophthalmology

## 2020-08-26 ENCOUNTER — Ambulatory Visit: Payer: Self-pay

## 2020-08-27 NOTE — Chronic Care Management (AMB) (Deleted)
Chronic Care Management Pharmacy  Name: Bryan Flowers  MRN: [id number] DOB: [birth year]  Chief Complaint/ HPI  Bryan Flowers,  59 y.o. , male presents for their Follow-Up CCM visit with the clinical pharmacist via telephone due to COVID-19 Pandemic.  PCP : [name], PA-C  Their chronic conditions include: DM, HTN, HLD, PD  Office Visits: 08/08/20: Patient presented to Dr. Lucio Edward for follow-up. BG 150-200.  6/17 DM, [place], BP 124/82 P 90 Wt 202.5 BMI 29.07, recent widower, FBG 150 - 200, suspect nonadherence, sees psych, PHQ neg,   Consult Visit: NA  Medications: Outpatient Encounter Medications as of 08/28/2020  Medication Sig  . acetaminophen (TYLENOL) 500 MG tablet Take 1,000 mg by mouth every 6 (six) hours as needed (pain).  Marland Kitchen amLODipine (NORVASC) 5 MG tablet Take 1 tablet (5 mg total) by mouth daily.  . Armodafinil 250 MG tablet Take by mouth in the morning.   Marland Kitchen b complex vitamins capsule Take 1 capsule by mouth daily.  . carbidopa-levodopa (SINEMET CR) 50-200 MG per tablet Take 1 tablet by mouth 3 (three) times daily.   . cholecalciferol (VITAMIN D3) 25 MCG (1000 UT) tablet Take 1,000 Units by mouth daily.  . DULoxetine (CYMBALTA) 60 MG capsule Take 120 mg by mouth at bedtime.  . Empagliflozin-metFORMIN HCl (SYNJARDY) 12.02-999 MG TABS Take 1 tablet by mouth 2 (two) times daily with a meal.  . glucose blood (ONETOUCH VERIO) test strip DX:E11.69, LON:99 Check TID  . glucose blood test strip Check once daily fasting, and up to 3 times a day as needed.  . insulin degludec (TRESIBA) 100 UNIT/ML FlexTouch Pen Inject 50-80 Units into the skin daily. Per MD/PA instruction  . Insulin Pen Needle (NOVOFINE) 32G X 6 MM MISC 1 each by Does not apply route daily.  Marland Kitchen lamoTRIgine (LAMICTAL) 200 MG tablet Take 400 mg by mouth at bedtime.   . liraglutide (VICTOZA) 18 MG/3ML SOPN Inject 1.8 mg into the skin daily.  Marland Kitchen LORazepam (ATIVAN) 1 MG tablet Typically takes  1 to 2 times daily   . pregabalin (LYRICA) 50 MG capsule Take 1 capsule (50 mg total) by mouth 2 (two) times daily. TAKE ONE CAPSULE BY MOUTH AT BEDTIME  . propranolol ER (INDERAL LA) 60 MG 24 hr capsule Take 60 mg by mouth at bedtime.   Marland Kitchen QUEtiapine (SEROQUEL XR) 400 MG 24 hr tablet TK 1 T PO QD AT 8PM ONE HOUR BEFORE OR AFTER A MEAL  . quinapril (ACCUPRIL) 20 MG tablet Take 1 tablet (20 mg total) by mouth at bedtime.  Marland Kitchen rOPINIRole (REQUIP) 3 MG tablet Take by mouth at bedtime.   . rosuvastatin (CRESTOR) 40 MG tablet Take 1 tablet (40 mg total) by mouth at bedtime.   No facility-administered encounter medications on file as of 08/28/2020.      Financial Resource Strain: Low Risk   . Difficulty of Paying Living Expenses: Not hard at all    Current Diagnosis/Assessment:  Goals Addressed   None    Diabetes   Recent Relevant Labs: Lab Results  Component Value Date/Time   HGBA1C 8.2 (H) 07/16/2020 04:01 PM   HGBA1C 12.0 (H) 03/21/2020 11:04 AM   HGBA1C 10.5 (A) 11/14/2018 02:04 PM   HGBA1C 6.3 07/14/2018 01:25 PM   MICROALBUR 1.2 09/12/2019 12:00 AM   MICROALBUR 20 03/14/2018 02:22 PM   MICROALBUR <0.2 07/02/2017 08:11 AM   MICROALBUR NEG 03/12/2016 03:16 PM     Checking BG: Rarely  Patient is currently  uncontrolled on the following medications:   Synjardy 12.02-999 mg twice daily   Tresiba 50-80 units daily   Victoza 1.8 mg daily   Last diabetic Foot exam: No results found for: HMDIABEYEEXA  Last diabetic Eye exam: No results found for: HMDIABFOOTEX   We discussed:    Plan  Continue current medications   Hypertension   BP goal is:  <140/90  Office blood pressures are  BP Readings from Last 3 Encounters:  07/16/20 124/86  03/21/20 124/82  10/16/19 (!) 148/88   Patient checks BP at home infrequently Patient home BP readings are ranging: NA  Patient has failed these meds in the past: amlodipine Patient is currently controlled on the following medications:  . Amlodipine 5 mg  daily  . Propanolol 60 mg QHS  . Quinapril 20 mg QHS   We discussed: Normally BP well controlled. Very upset in January.  Plan  Continue current medications   Hyperlipidemia   LDL goal < 100  Lipid Panel     Component Value Date/Time   CHOL 243 (H) 07/16/2020 1601   CHOL 154 03/12/2016 1008   TRIG 242 (H) 07/16/2020 1601   HDL 44 07/16/2020 1601   HDL 33 (L) 03/12/2016 1008   LDLCALC 158 (H) 07/16/2020 1601    Hepatic Function Latest Ref Rng & Units 07/16/2020 03/21/2020 10/17/2019  Total Protein 6.1 - 8.1 g/dL 6.6 7.3 6.9  Albumin 3.5 - 5.0 g/dL - - -  AST 10 - 35 U/L _0 ALT 9 - 46 U/L _1 Alk Phosphatase 38 - 126 U/L - - -  Total Bilirubin 0.2 - 1.2 mg/dL 0.4 0.5 0.4     The 10-year ASCVD risk score Mikey Bussing DC Jr., et al., 2013) is: 21.1%   Values used to calculate the score:     Age: 20 years     Sex: Male     Is Non-Hispanic African American: No     Diabetic: Yes     Tobacco smoker: No     Systolic Blood Pressure: 400 mmHg     Is BP treated: Yes     HDL Cholesterol: 44 mg/dL     Total Cholesterol: 243 mg/dL   Patient has failed these meds in past: NA Patient is currently uncontrolled on the following medications:  . Rosuvastatin 34m daily  We discussed:   Crestor myalgia went away after beginning Can't determine control until TG lower  Plan  Patient to start fish oil 1g 2 caps by mouth twice daily  Medication Management   [facility] [place]? Yes Pt endorses 100% compliance  Continue current medication management strategy  Follow up: 3 month phone visit  [phone]

## 2020-08-28 ENCOUNTER — Ambulatory Visit: Payer: PPO

## 2020-09-02 ENCOUNTER — Telehealth: Payer: Self-pay | Admitting: *Deleted

## 2020-09-02 NOTE — Chronic Care Management (AMB) (Signed)
  Care Management   Note  09/02/2020 Name: Bryan Flowers MRN: [id number] DOB: 1961/07/09  Bryan Flowers is a 59 y.o. year old male who is a primary care patient of Laurell Roof and is actively engaged with the care management team. I reached out to Bryan Flowers by phone today to assist with re-scheduling an initial visit with the Pharmacist  Follow up plan: Unsuccessful telephone outreach attempt made. The care management team will reach out to the patient again over the next 7 days. If patient returns call to provider office, please advise to call [place] at [phone].  [place] Management

## 2020-09-04 ENCOUNTER — Telehealth: Payer: Self-pay

## 2020-09-04 NOTE — Chronic Care Management (AMB) (Signed)
  Chronic Care Management   Outreach Note   Name: SHAYE LAGACE MRN: [id number] DOB: 04-28-1961  Primary Care Provider: [name], PA-C Reason for referral : Chronic Care Management   Mr. Dunwoody was referred to the case management team for assistance with care management and care coordination. Confirmed availability for the initial telephonic assessment on 09/04/20. Agreed to call if needed prior to the scheduled outreach.   Follow Up Plan:  A member of the care management team will follow-up with Mr. Schreur as scheduled on 09/04/20.    [facility] Care Management [facility] [phone]

## 2020-09-04 NOTE — Telephone Encounter (Signed)
  Chronic Care Management   Outreach Note  09/04/2020 Name: KAIDON KINKER MRN: [id number] DOB: Jan 16, 1961  Primary Care Provider: [name], PA-C Reason for referral : Chronic Care Management   Mr. Hulbert was referred to the case management team for assistance with care management and care coordination. The initial telephonic assessment was rescheduled for today. I was unable to reach Mr. Palau today.    Follow Up Plan:  Unable to leave a voice message. Will make a final attempt to reschedule Mr. Cutting' assessment within the next two weeks.    [facility] Care Management [facility] [phone]

## 2020-09-05 ENCOUNTER — Telehealth: Payer: Self-pay

## 2020-09-06 DIAGNOSIS — F3181 Bipolar II disorder: Secondary | ICD-10-CM | POA: Diagnosis not present

## 2020-09-06 DIAGNOSIS — Z79891 Long term (current) use of opiate analgesic: Secondary | ICD-10-CM | POA: Diagnosis not present

## 2020-09-06 DIAGNOSIS — G2581 Restless legs syndrome: Secondary | ICD-10-CM | POA: Diagnosis not present

## 2020-09-06 DIAGNOSIS — G4726 Circadian rhythm sleep disorder, shift work type: Secondary | ICD-10-CM | POA: Diagnosis not present

## 2020-09-06 NOTE — Chronic Care Management (AMB) (Signed)
  Care Management   Note  09/06/2020 Name: Bryan Flowers MRN: [id number] DOB: [birth year]  Bryan Flowers is a 59 y.o. year old male who is a primary care patient of Laurell Roof and is actively engaged with the care management team. I reached out to Bryan Flowers by phone today to assist with re-scheduling an initial visit with the Pharmacist  Follow up plan: Unsuccessful telephone outreach attempt made. The care management team will reach out to the patient again over the next 7 days. If patient returns call to provider office, please advise to call [place] at [phone].  [place] Management

## 2020-09-06 NOTE — Progress Notes (Signed)
09/04/2020 no answer 09/05/2020 LVM

## 2020-09-10 NOTE — Chronic Care Management (AMB) (Signed)
  Care Management   Note  09/10/2020 Name: Bryan Flowers MRN: [id number] DOB: April 28, 1961  Bryan Flowers is a 59 y.o. year old male who is a primary care patient of Laurell Roof and is actively engaged with the care management team. I reached out to Bryan Flowers by phone today to assist with scheduling an initial visit with the Pharmacist  Follow up plan: Face to Face appointment with care management team member scheduled for: 10/09/2020  [place] Management  Direct Dial: [phone]

## 2020-10-01 ENCOUNTER — Ambulatory Visit: Payer: Self-pay

## 2020-10-01 NOTE — Chronic Care Management (AMB) (Signed)
  Chronic Care Management   Outreach Note  10/01/2020 Name: Bryan Flowers MRN: [id number] DOB: 19-Feb-1961  Primary Care Provider: Danelle Berry, PA-C Reason for referral : Chronic Case Management   Bryan Flowers was referred to the care management team for assistance with chronic care management and care coordination. His primary care provider will be notified of our unsuccessful attempts to maintain contact.  The care management team will gladly outreach at any time in the future if he is interested in receiving assistance.   Follow Up Plan:  The care management team will gladly follow up with Bryan Flowers after the primary care provider has a conversation with him regarding recommendation for care management engagement and subsequent re-referral for care management services.    [facility] Care Management [facility] [phone]

## 2020-10-07 NOTE — Chronic Care Management (AMB) (Deleted)
Chronic Care Management Pharmacy  Name: Bryan Flowers  MRN: [id number] DOB: 1961-09-30   Chief Complaint/ HPI  Bryan Flowers,  60 y.o. , male presents for his Initial CCM visit with the clinical pharmacist In office.  PCP : [name], PA-C Patient Care Team: [name], PA-C as PCP - General (Family Medicine) Madelyn Brunner, MD as Consulting Physician (Neurology) [name], NP as Consulting Physician Germaine Pomfret, [employer] as Pharmacist (Pharmacist)  Patient's chronic conditions include: Hypertension, Hyperlipidemia, Diabetes, GERD, Anxiety and Bipolar 1 Disorder and Parkinson disease   Office Visits: ***  Consult Visit: ***   Subjective: ***  Objective: No Known Allergies  Medications: Outpatient Encounter Medications as of 10/09/2020  Medication Sig  . acetaminophen (TYLENOL) 500 MG tablet Take 1,000 mg by mouth every 6 (six) hours as needed (pain).  Marland Kitchen amLODipine (NORVASC) 5 MG tablet Take 1 tablet (5 mg total) by mouth daily.  . Armodafinil 250 MG tablet Take by mouth in the morning.   Marland Kitchen b complex vitamins capsule Take 1 capsule by mouth daily.  . carbidopa-levodopa (SINEMET CR) 50-200 MG per tablet Take 1 tablet by mouth 3 (three) times daily.   . cholecalciferol (VITAMIN D3) 25 MCG (1000 UT) tablet Take 1,000 Units by mouth daily.  . DULoxetine (CYMBALTA) 60 MG capsule Take 120 mg by mouth at bedtime.  . Empagliflozin-metFORMIN HCl (SYNJARDY) 12.02-999 MG TABS Take 1 tablet by mouth 2 (two) times daily with a meal.  . glucose blood (ONETOUCH VERIO) test strip DX:E11.69, LON:99 Check TID  . glucose blood test strip Check once daily fasting, and up to 3 times a day as needed.  . insulin degludec (TRESIBA) 100 UNIT/ML FlexTouch Pen Inject 50-80 Units into the skin daily. Per MD/PA instruction  . Insulin Pen Needle (NOVOFINE) 32G X 6 MM MISC 1 each by Does not apply route daily.  Marland Kitchen lamoTRIgine (LAMICTAL) 200 MG tablet Take 400 mg by mouth at bedtime.    . liraglutide (VICTOZA) 18 MG/3ML SOPN Inject 1.8 mg into the skin daily.  Marland Kitchen LORazepam (ATIVAN) 1 MG tablet Typically takes  1 to 2 times daily  . pregabalin (LYRICA) 50 MG capsule Take 1 capsule (50 mg total) by mouth 2 (two) times daily. TAKE ONE CAPSULE BY MOUTH AT BEDTIME  . propranolol ER (INDERAL LA) 60 MG 24 hr capsule Take 60 mg by mouth at bedtime.   Marland Kitchen QUEtiapine (SEROQUEL XR) 400 MG 24 hr tablet TK 1 T PO QD AT 8PM ONE HOUR BEFORE OR AFTER A MEAL  . quinapril (ACCUPRIL) 20 MG tablet Take 1 tablet (20 mg total) by mouth at bedtime.  Marland Kitchen rOPINIRole (REQUIP) 3 MG tablet Take by mouth at bedtime.   . rosuvastatin (CRESTOR) 40 MG tablet Take 1 tablet (40 mg total) by mouth at bedtime.   No facility-administered encounter medications on file as of 10/09/2020.    Wt Readings from Last 3 Encounters:  07/16/20 214 lb (97.1 kg)  03/21/20 202 lb 9.6 oz (91.9 kg)  10/16/19 221 lb (100.2 kg)    Lab Results  Component Value Date   CREATININE 0.87 07/16/2020   BUN 16 07/16/2020   GFRNONAA 95 07/16/2020   GFRAA 110 07/16/2020   NA 138 07/16/2020   K 4.5 07/16/2020   CALCIUM 9.1 07/16/2020   CO2 25 07/16/2020     Current Diagnosis/Assessment:    Goals Addressed   None    Hypertension   BP goal is:  {CHL HP  UPSTREAM Pharmacist BP ranges:[phone]}  Office blood pressures are  BP Readings from Last 3 Encounters:  07/16/20 124/86  03/21/20 124/82  10/16/19 (!) 148/88   Patient checks BP at home {CHL HP BP Monitoring Frequency:[phone]} Patient home BP readings are ranging: ***  Patient has failed these meds in the past: *** Patient is currently {CHL Controlled/Uncontrolled:[phone]} on the following medications:  . Amlodipine 5 mg daily  . Propanlol ER 60 mg QHS  . Quinapril 20 mg QHS   We discussed {CHL HP [facility]stream Pharmacy discussion:[phone]}  Plan  Continue {CHL HP Upstream Pharmacy Plans:[phone]}   Hyperlipidemia   LDL goal < ***  Last  lipids Lab Results  Component Value Date   CHOL 243 (H) 07/16/2020   HDL 44 07/16/2020   LDLCALC 158 (H) 07/16/2020   TRIG 242 (H) 07/16/2020   CHOLHDL 5.5 (H) 07/16/2020   Hepatic Function Latest Ref Rng & Units 07/16/2020 03/21/2020 10/17/2019  Total Protein 6.1 - 8.1 g/dL 6.6 7.3 6.9  Albumin 3.5 - 5.0 g/dL - - -  AST 10 - 35 U/L 12 14 19   ALT 9 - 46 U/L 16 21 17   Alk Phosphatase 38 - 126 U/L - - -  Total Bilirubin 0.2 - 1.2 mg/dL 0.4 0.5 0.4     The 10-year ASCVD risk score Mikey Bussing DC Jr., et al., 2013) is: 21.1%   Values used to calculate the score:     Age: 3 years     Sex: Male     Is Non-Hispanic African American: No     Diabetic: Yes     Tobacco smoker: No     Systolic Blood Pressure: 130 mmHg     Is BP treated: Yes     HDL Cholesterol: 44 mg/dL     Total Cholesterol: 243 mg/dL   Patient has failed these meds in past: *** Patient is currently {CHL Controlled/Uncontrolled:[phone]} on the following medications:  . Rosuvastatin 40 mg QHS   We discussed:  {CHL HP Upstream Pharmacy discussion:[phone]}  Plan  Continue {CHL HP Upstream Pharmacy Plans:[phone]}  Diabetes   A1c goal {A1c goals:23924}  Recent Relevant Labs: Lab Results  Component Value Date/Time   HGBA1C 8.2 (H) 07/16/2020 04:01 PM   HGBA1C 12.0 (H) 03/21/2020 11:04 AM   HGBA1C 10.5 (A) 11/14/2018 02:04 PM   HGBA1C 6.3 07/14/2018 01:25 PM   FRUCTOSAMINE 469 (H) 04/09/2015 09:34 AM   MICROALBUR 1.2 09/12/2019 12:00 AM   MICROALBUR 20 03/14/2018 02:22 PM   MICROALBUR <0.2 07/02/2017 08:11 AM   MICROALBUR NEG 03/12/2016 03:16 PM    Last diabetic Eye exam: No results found for: HMDIABEYEEXA  Last diabetic Foot exam: No results found for: HMDIABFOOTEX   Checking BG: {CHL HP Blood Glucose Monitoring Frequency:[phone]}  Recent FBG Readings: *** Recent pre-meal BG readings: *** Recent 2hr PP BG readings:  *** Recent HS BG readings: ***  Patient has failed these meds in past:  *** Patient is currently {CHL Controlled/Uncontrolled:[phone]} on the following medications: . Synjardy 12.02-999 mg twice daily  . Tresiba 50-80 units daily  . Victoza 1.8 mg daily   We discussed: {CHL HP Upstream Pharmacy discussion:[phone]}  Plan  Continue {CHL HP Upstream Pharmacy Plans:[phone]}  Depression / Anxiety   PHQ9 Score:  PHQ9 SCORE ONLY 08/08/2020 07/16/2020 03/21/2020  PHQ-9 Total Score 0 0 0   GAD7 Score: GAD 7 : Generalized Anxiety Score 03/21/2020 11/14/2018  Nervous, Anxious, on Edge 0 2  Control/stop worrying 0 2  Worry too much -  different things 0 2  Trouble relaxing 0 1  Restless 0 1  Easily annoyed or irritable 0 0  Afraid - awful might happen 0 1  Total GAD 7 Score 0 9  Anxiety Difficulty Not difficult at all Somewhat difficult    Patient has failed these meds in past: *** Patient is currently {CHL Controlled/Uncontrolled:[phone]} on the following medications:  . *** Duloxetine 60 mg 2 caps QHS Lorazepam 1 mg 1-2 times daily We discussed:  ***  Plan  Continue {CHL HP Upstream Pharmacy Plans:[phone]}  Bipolar 1 Disorder   Patient has failed these meds in past: *** Patient is currently {CHL Controlled/Uncontrolled:[phone]} on the following medications:  . ***Lamotrigine 200 mg 2 caps QHS  Quetiapine XR 400 mg daily   We discussed:  ***  Plan  Continue {CHL HP Upstream Pharmacy Plans:[phone]}  Parkinson Disease   Patient has failed these meds in past: *** Patient is currently {CHL Controlled/Uncontrolled:[phone]} on the following medications:  . ***Sinemet CR 50-200 mg TID   We discussed:  ***  Plan  Continue {CHL HP Upstream Pharmacy Plans:[phone]}   Chronic Pain   Patient has failed these meds in past: *** Patient is currently {CHL Controlled/Uncontrolled:[phone]} on the following medications:  . ***APAP 500 mg 2 tabs q6hr PRN Pregabalin 50 mg twice daily Ropinirole 3 mg QHS   We discussed:   ***  Plan  Continue {CHL HP Upstream Pharmacy Plans:[phone]}   Misc / OTC    . Armodafinil 250 mg QAM  . Vitamin B complex daily  . Vitamin D3 1000 units daily   We discussed:  ***  Plan  Continue {CHL HP Upstream Pharmacy EKBTC:[id number]}    Medication Management   Patient's preferred pharmacy is:  [id number] [address]ina [address]on, [place] AT [place] [address] [place] [address] Phone: [phone] Fax: [phone]  [facility] - [place], [place] - [address] [place] [address] Phone: [phone] Fax: [phone]  Uses pill box? {Yes or If no, why not?:20788} Pt endorses ***% compliance  We discussed: {Pharmacy options:24294}  Plan  {US Pharmacy AJHH:83437}    Follow up: *** month phone visit  ***

## 2020-10-09 ENCOUNTER — Ambulatory Visit: Payer: PPO

## 2020-10-10 DIAGNOSIS — G4726 Circadian rhythm sleep disorder, shift work type: Secondary | ICD-10-CM | POA: Diagnosis not present

## 2020-10-10 DIAGNOSIS — F3181 Bipolar II disorder: Secondary | ICD-10-CM | POA: Diagnosis not present

## 2020-10-10 DIAGNOSIS — G2581 Restless legs syndrome: Secondary | ICD-10-CM | POA: Diagnosis not present

## 2020-10-15 ENCOUNTER — Ambulatory Visit: Payer: PPO

## 2020-10-17 ENCOUNTER — Ambulatory Visit: Payer: PPO | Admitting: Family Medicine

## 2020-11-11 ENCOUNTER — Other Ambulatory Visit: Payer: Self-pay | Admitting: Family Medicine

## 2020-11-11 DIAGNOSIS — M797 Fibromyalgia: Secondary | ICD-10-CM

## 2020-11-11 NOTE — Telephone Encounter (Signed)
Requested medication (s) are due for refill today: no  Requested medication (s) are on the active medication list: yes  Last refill:  05/07/2020  Future visit scheduled: yes  Notes to clinic:  this refill cannot be delegated    Requested Prescriptions  Pending Prescriptions Disp Refills   pregabalin (LYRICA) 50 MG capsule [Pharmacy Med Name: PREGABALIN 50MG  CAPSULES] 60 capsule     Sig: TAKE ONE CAPSULE BY MOUTH TWICE DAILY      Not Delegated - Neurology:  Anticonvulsants - Controlled Failed - 11/11/2020  5:04 PM      Failed - This refill cannot be delegated      Passed - Valid encounter within last 12 months    Recent Outpatient Visits           3 months ago Uncontrolled type 2 diabetes mellitus with hyperglycemia [facility])   [facility] [name], PA-C   3 months ago Uncontrolled type 2 diabetes mellitus with hyperglycemia [facility])   [facility] [name], PA-C   7 months ago Diabetes mellitus due to underlying condition with hyperglycemia, with long-term current use of insulin [facility])   [facility] [name], PA-C   1 year ago Essential hypertension   [facility] [name], PA-C   1 year ago [place] [place]       Future Appointments             Tomorrow [facility], [facility]

## 2020-11-12 ENCOUNTER — Ambulatory Visit: Payer: PPO

## 2020-11-14 DIAGNOSIS — G4726 Circadian rhythm sleep disorder, shift work type: Secondary | ICD-10-CM | POA: Diagnosis not present

## 2020-11-14 DIAGNOSIS — G2581 Restless legs syndrome: Secondary | ICD-10-CM | POA: Diagnosis not present

## 2020-11-14 DIAGNOSIS — F3181 Bipolar II disorder: Secondary | ICD-10-CM | POA: Diagnosis not present

## 2020-11-25 ENCOUNTER — Telehealth: Payer: Self-pay

## 2020-11-25 NOTE — Progress Notes (Signed)
Chronic Care Management Pharmacy Assistant   Name: Bryan Flowers  MRN: [id number] DOB: [birth year]  Reason for Encounter:Diabetes Disease State  Call. Patient Questions:  1.  Have you seen any other providers since your last visit? No  2.  Any changes in your medicines or health? No   PCP : [name], PA-C  Allergies:  No Known Allergies  Medications: Outpatient Encounter Medications as of 11/25/2020  Medication Sig  . acetaminophen (TYLENOL) 500 MG tablet Take 1,000 mg by mouth every 6 (six) hours as needed (pain).  Marland Kitchen amLODipine (NORVASC) 5 MG tablet Take 1 tablet (5 mg total) by mouth daily.  . Armodafinil 250 MG tablet Take by mouth in the morning.   Marland Kitchen b complex vitamins capsule Take 1 capsule by mouth daily.  . carbidopa-levodopa (SINEMET CR) 50-200 MG per tablet Take 1 tablet by mouth 3 (three) times daily.   . cholecalciferol (VITAMIN D3) 25 MCG (1000 UT) tablet Take 1,000 Units by mouth daily.  . DULoxetine (CYMBALTA) 60 MG capsule Take 120 mg by mouth at bedtime.  . Empagliflozin-metFORMIN HCl (SYNJARDY) 12.02-999 MG TABS Take 1 tablet by mouth 2 (two) times daily with a meal.  . glucose blood (ONETOUCH VERIO) test strip DX:E11.69, LON:99 Check TID  . glucose blood test strip Check once daily fasting, and up to 3 times a day as needed.  . insulin degludec (TRESIBA) 100 UNIT/ML FlexTouch Pen Inject 50-80 Units into the skin daily. Per MD/PA instruction  . Insulin Pen Needle (NOVOFINE) 32G X 6 MM MISC 1 each by Does not apply route daily.  Marland Kitchen lamoTRIgine (LAMICTAL) 200 MG tablet Take 400 mg by mouth at bedtime.   . liraglutide (VICTOZA) 18 MG/3ML SOPN Inject 1.8 mg into the skin daily.  Marland Kitchen LORazepam (ATIVAN) 1 MG tablet Typically takes  1 to 2 times daily  . pregabalin (LYRICA) 50 MG capsule TAKE ONE CAPSULE BY MOUTH TWICE DAILY  . propranolol ER (INDERAL LA) 60 MG 24 hr capsule Take 60 mg by mouth at bedtime.   Marland Kitchen QUEtiapine (SEROQUEL XR) 400 MG 24 hr tablet TK 1 T PO  QD AT 8PM ONE HOUR BEFORE OR AFTER A MEAL  . quinapril (ACCUPRIL) 20 MG tablet Take 1 tablet (20 mg total) by mouth at bedtime.  Marland Kitchen rOPINIRole (REQUIP) 3 MG tablet Take by mouth at bedtime.   . rosuvastatin (CRESTOR) 40 MG tablet Take 1 tablet (40 mg total) by mouth at bedtime.   No facility-administered encounter medications on file as of 11/25/2020.    Current Diagnosis: Patient Active Problem List   Diagnosis Date Noted  . Morbid obesity ([place]) 02/16/2019  . Pure hypercholesterolemia 02/16/2019  . Grief 02/16/2019  . Major depression, recurrent ([place]) 03/14/2018  . Bipolar 1 disorder, manic, moderate ([place])   . Vitamin D deficiency 09/10/2017  . Gastroesophageal reflux disease without esophagitis 09/10/2017  . History of iron deficiency anemia 03/30/2016  . History of iron deficiency anemia 03/24/2016  . History of GI bleed 03/15/2016  . Hyponatremia 03/15/2016  . Parkinson disease ([place])   . Hypertension   . Anxiety 08/19/2015  . Abnormal ECG 04/09/2015  . Back ache 04/09/2015  . Diabetes mellitus type 2 in obese ([place]) 04/09/2015  . Obstructive sleep apnea 04/09/2015  . HDL deficiency 04/09/2015  . Hearing loss, sensorineural, combined types 04/09/2015  . HLD (hyperlipidemia) 04/09/2015  . Vocal cord atrophy 05/29/2014  . Fibromyalgia 03/15/2014  . Testicular hypofunction 05/17/2009  . Migraine with aura 05/14/2009  Goals Addressed   None    Recent Relevant Labs: Lab Results  Component Value Date/Time   HGBA1C 8.2 (H) 07/16/2020 04:01 PM   HGBA1C 12.0 (H) 03/21/2020 11:04 AM   HGBA1C 10.5 (A) 11/14/2018 02:04 PM   HGBA1C 6.3 07/14/2018 01:25 PM   MICROALBUR 1.2 09/12/2019 12:00 AM   MICROALBUR 20 03/14/2018 02:22 PM   MICROALBUR <0.2 07/02/2017 08:11 AM   MICROALBUR NEG 03/12/2016 03:16 PM    Kidney Function Lab Results  Component Value Date/Time   CREATININE 0.87 07/16/2020 04:01 PM   CREATININE 0.92 03/21/2020 11:04 AM   GFRNONAA 95 07/16/2020 04:01 PM    GFRAA 110 07/16/2020 04:01 PM    . Current antihyperglycemic regimen:  -Empagliflozin-metFORMIN HCl (SYNJARDY) 12.02-999 MG TABS  -liraglutide (VICTOZA) 18 MG/3ML SOPN; Inject 1.8 mg into the skin daily -insulin degludec (TRESIBA) 100 UNIT/ML FlexTouch Pen dose to 55 units daily to start  . What recent interventions/DTPs have been made to improve glycemic control:  o None ID  . Have there been any recent hospitalizations or ED visits since last visit with CPP? No Adherence Review: Is the patient currently on a STATIN medication? Yes Is the patient currently on ACE/ARB medication? No Does the patient have >5 day gap between last estimated fill dates? Yes   I have attempted without success to contact this patient by phone three times to do his Diabetes Disease State call. I left a Voice message for patient to return my call.  No voice box to left voice message on 02/21,02/23,02/24. Maryjean Ka  Follow-Up:  Pharmacist Review   Anderson [place] Clinical Pharmacist Assistant [phone]

## 2020-12-09 ENCOUNTER — Ambulatory Visit: Payer: PPO | Admitting: Family Medicine

## 2020-12-09 ENCOUNTER — Telehealth: Payer: Self-pay

## 2020-12-09 NOTE — Progress Notes (Signed)
Unable to leave a message to schedule a follow up appointment with clinical pharmacist.  Anderson Malta Clinical Pharmacist Assistant [phone]

## 2020-12-24 ENCOUNTER — Ambulatory Visit: Payer: Self-pay | Admitting: Physician Assistant

## 2021-01-07 ENCOUNTER — Telehealth: Payer: Self-pay | Admitting: Family Medicine

## 2021-01-07 NOTE — Telephone Encounter (Signed)
Copied from [place] [phone]. Topic: Medicare AWV >> Jan 07, 2021  3:00 PM Cher Nakai R wrote: Reason for CRM:  No answer unable to leave a message for patient to call back and schedule Medicare Annual Wellness Visit (AWV) in office.   If unable to come into the office for AWV,  please offer to do virtually or by telephone.  Maryjane Hurter AWV: 10/12/2019  Please schedule at anytime with [place].  40 minute appointment  Any questions, please contact me at [phone]

## 2021-01-16 DIAGNOSIS — G2581 Restless legs syndrome: Secondary | ICD-10-CM | POA: Diagnosis not present

## 2021-01-16 DIAGNOSIS — G4726 Circadian rhythm sleep disorder, shift work type: Secondary | ICD-10-CM | POA: Diagnosis not present

## 2021-01-16 DIAGNOSIS — F3181 Bipolar II disorder: Secondary | ICD-10-CM | POA: Diagnosis not present

## 2021-02-05 DIAGNOSIS — G4726 Circadian rhythm sleep disorder, shift work type: Secondary | ICD-10-CM | POA: Diagnosis not present

## 2021-02-05 DIAGNOSIS — F3181 Bipolar II disorder: Secondary | ICD-10-CM | POA: Diagnosis not present

## 2021-02-05 DIAGNOSIS — G2581 Restless legs syndrome: Secondary | ICD-10-CM | POA: Diagnosis not present

## 2021-02-17 ENCOUNTER — Telehealth: Payer: Self-pay

## 2021-02-17 NOTE — Progress Notes (Signed)
    Chronic Care Management Pharmacy Assistant   Name: Bryan Flowers  MRN: [id number] DOB: 14-Sep-1961  Reason for Encounter: Medication Review /General Adherence Call.   Recent office visits:  No recent Office visits  Recent consult visits:  No recent consult visit  Hospital visits:  None in previous 6 months  Medications: Outpatient Encounter Medications as of 02/17/2021  Medication Sig  . acetaminophen (TYLENOL) 500 MG tablet Take 1,000 mg by mouth every 6 (six) hours as needed (pain).  Marland Kitchen amLODipine (NORVASC) 5 MG tablet Take 1 tablet (5 mg total) by mouth daily.  . Armodafinil 250 MG tablet Take by mouth in the morning.   Marland Kitchen b complex vitamins capsule Take 1 capsule by mouth daily.  . carbidopa-levodopa (SINEMET CR) 50-200 MG per tablet Take 1 tablet by mouth 3 (three) times daily.   . cholecalciferol (VITAMIN D3) 25 MCG (1000 UT) tablet Take 1,000 Units by mouth daily.  . DULoxetine (CYMBALTA) 60 MG capsule Take 120 mg by mouth at bedtime.  . Empagliflozin-metFORMIN HCl (SYNJARDY) 12.02-999 MG TABS Take 1 tablet by mouth 2 (two) times daily with a meal.  . glucose blood (ONETOUCH VERIO) test strip DX:E11.69, LON:99 Check TID  . glucose blood test strip Check once daily fasting, and up to 3 times a day as needed.  . insulin degludec (TRESIBA) 100 UNIT/ML FlexTouch Pen Inject 50-80 Units into the skin daily. Per MD/PA instruction  . Insulin Pen Needle (NOVOFINE) 32G X 6 MM MISC 1 each by Does not apply route daily.  Marland Kitchen lamoTRIgine (LAMICTAL) 200 MG tablet Take 400 mg by mouth at bedtime.   . liraglutide (VICTOZA) 18 MG/3ML SOPN Inject 1.8 mg into the skin daily.  Marland Kitchen LORazepam (ATIVAN) 1 MG tablet Typically takes  1 to 2 times daily  . pregabalin (LYRICA) 50 MG capsule TAKE ONE CAPSULE BY MOUTH TWICE DAILY  . propranolol ER (INDERAL LA) 60 MG 24 hr capsule Take 60 mg by mouth at bedtime.   Marland Kitchen QUEtiapine (SEROQUEL XR) 400 MG 24 hr tablet TK 1 T PO QD AT 8PM ONE HOUR BEFORE OR AFTER  A MEAL  . quinapril (ACCUPRIL) 20 MG tablet Take 1 tablet (20 mg total) by mouth at bedtime.  Marland Kitchen rOPINIRole (REQUIP) 3 MG tablet Take by mouth at bedtime.   . rosuvastatin (CRESTOR) 40 MG tablet Take 1 tablet (40 mg total) by mouth at bedtime.   No facility-administered encounter medications on file as of 02/17/2021.    Star Rating Drugs: Synjardy 12.02-999 mg last filled on 06/19/2020 for 30 day supply at [facility]. Victoza 18 mg/3 ML - N/A Quinapril 20 mg last filled on 06/14/2020 for 20 day supply at [facility]. Rosuvastatin 40 mg last filled on 06/14/2020 for 20 day supply at [facility].    I have attempted without success to contact this patient by phone three times to do his general adherence call. I left a Voice message for patient to return my call.    Unable to leave a message due to no voice mail on 05/16,05/18,05/19

## 2021-02-25 ENCOUNTER — Ambulatory Visit (INDEPENDENT_AMBULATORY_CARE_PROVIDER_SITE_OTHER): Payer: PPO

## 2021-02-25 ENCOUNTER — Other Ambulatory Visit: Payer: Self-pay

## 2021-02-25 VITALS — BP 138/80 | HR 77 | Temp 97.6°F | Resp 16 | Ht 64.0 in | Wt 197.1 lb

## 2021-02-25 DIAGNOSIS — Z1211 Encounter for screening for malignant neoplasm of colon: Secondary | ICD-10-CM

## 2021-02-25 DIAGNOSIS — Z0000 Encounter for general adult medical examination without abnormal findings: Secondary | ICD-10-CM

## 2021-02-25 NOTE — Progress Notes (Signed)
Subjective:   Bryan Flowers is a 60 y.o. male who presents for Medicare Annual/Subsequent preventive examination.  Review of Systems     Cardiac Risk Factors include: advanced age (>61men, >45 women);diabetes mellitus;dyslipidemia;male gender;hypertension;obesity (BMI >30kg/m2)     Objective:    Today's Vitals   02/25/21 0832 02/25/21 0834  BP: 138/80   Pulse: 77   Resp: 16   Temp: 97.6 F (36.4 C)   TempSrc: Oral   SpO2: 99%   Weight: 197 lb 1.6 oz (89.4 kg)   Height: 5\' 4"  (1.626 m)   PainSc:  5    Body mass index is 33.83 kg/m.  Advanced Directives 02/25/2021 10/12/2019 09/22/2018 09/08/2017 07/06/2017 06/17/2017 06/09/2017  Does Patient Have a Medical Advance Directive? No Yes Yes Yes No No No  Type of Advance Directive - [place];Living will [place];Living will [place]  Does patient want to make changes to medical advance directive? - - (No Data) - - - -  Copy of [place] in Chart? - Yes - validated most recent copy scanned in chart (See row information) - Yes - - -  Would patient like information on creating a medical advance directive? No - Patient declined - - - - - -    Current Medications (verified) Outpatient Encounter Medications as of 02/25/2021  Medication Sig  . acetaminophen (TYLENOL) 500 MG tablet Take 1,000 mg by mouth every 6 (six) hours as needed (pain).  Marland Kitchen amLODipine (NORVASC) 5 MG tablet Take 1 tablet (5 mg total) by mouth daily.  . Armodafinil 250 MG tablet Take by mouth in the morning.   Marland Kitchen b complex vitamins capsule Take 1 capsule by mouth daily.  . carbidopa-levodopa (SINEMET CR) 50-200 MG per tablet Take 1 tablet by mouth 3 (three) times daily.   . cholecalciferol (VITAMIN D3) 25 MCG (1000 UT) tablet Take 1,000 Units by mouth daily.  . DULoxetine (CYMBALTA) 60 MG capsule Take 120 mg by mouth at bedtime.  . Empagliflozin-metFORMIN HCl (SYNJARDY) 12.02-999 MG TABS  Take 1 tablet by mouth 2 (two) times daily with a meal.  . glucose blood (ONETOUCH VERIO) test strip DX:E11.69, LON:99 Check TID  . glucose blood test strip Check once daily fasting, and up to 3 times a day as needed.  . insulin degludec (TRESIBA) 100 UNIT/ML FlexTouch Pen Inject 50-80 Units into the skin daily. Per MD/PA instruction  . Insulin Pen Needle (NOVOFINE) 32G X 6 MM MISC 1 each by Does not apply route daily.  Marland Kitchen lamoTRIgine (LAMICTAL) 200 MG tablet Take 400 mg by mouth at bedtime.   Marland Kitchen LORazepam (ATIVAN) 1 MG tablet Typically takes  1 to 2 times daily  . pregabalin (LYRICA) 50 MG capsule TAKE ONE CAPSULE BY MOUTH TWICE DAILY  . propranolol ER (INDERAL LA) 60 MG 24 hr capsule Take 60 mg by mouth at bedtime.   Marland Kitchen QUEtiapine (SEROQUEL) 300 MG tablet Take 300 mg by mouth at bedtime.  . quinapril (ACCUPRIL) 20 MG tablet Take 1 tablet (20 mg total) by mouth at bedtime.  Marland Kitchen rOPINIRole (REQUIP) 2 MG tablet Take 2 mg by mouth 2 (two) times daily.  . rosuvastatin (CRESTOR) 40 MG tablet Take 1 tablet (40 mg total) by mouth at bedtime.  . [DISCONTINUED] liraglutide (VICTOZA) 18 MG/3ML SOPN Inject 1.8 mg into the skin daily.  . [DISCONTINUED] QUEtiapine (SEROQUEL XR) 400 MG 24 hr tablet TK 1 T PO QD AT  8PM ONE HOUR BEFORE OR AFTER A MEAL  . [DISCONTINUED] rOPINIRole (REQUIP) 3 MG tablet Take by mouth at bedtime.    No facility-administered encounter medications on file as of 02/25/2021.    Allergies (verified) Patient has no known allergies.   History: Past Medical History:  Diagnosis Date  . Anemia    iron deficiency  . Anxiety   . Bipolar 1 disorder, manic, moderate ([place])   . Claudication ([place]) 07/16/2017  . Depression   . Diabetes mellitus without complication ([place])   . GERD (gastroesophageal reflux disease)   . Hyperlipidemia   . Hypertension   . Insomnia   . Parkinson disease ([place])   . Peptic ulcer disease with hemorrhage   . Sleep apnea    Past Surgical History:  Procedure  Laterality Date  . ESOPHAGOGASTRODUODENOSCOPY Left 03/16/2016   Procedure: ESOPHAGOGASTRODUODENOSCOPY (EGD);  Surgeon: Manus Gunning, MD;  Location: [facility];  Service: Gastroenterology;  Laterality: Left;  . FOOT FRACTURE SURGERY Right    Family History  Problem Relation Age of Onset  . Cancer Mother   . Heart disease Father   . Arthritis Brother    Social History   Socioeconomic History  . Marital status: Widowed    Spouse name: Not on file  . Number of children: 0  . Years of education: Not on file  . Highest education level: High school graduate  Occupational History    Employer: DISABLED  Tobacco Use  . Smoking status: Never Smoker  . Smokeless tobacco: Never Used  . Tobacco comment: Non-smoker  Vaping Use  . Vaping Use: Never used  Substance and Sexual Activity  . Alcohol use: No    Alcohol/week: 0.0 standard drinks  . Drug use: No  . Sexual activity: Not Currently    Partners: Female  Other Topics Concern  . Not on file  Social History Narrative   Pt lives alone.    Social Determinants of Health   Financial Resource Strain: Low Risk   . Difficulty of Paying Living Expenses: Not hard at all  Food Insecurity: No Food Insecurity  . Worried About Charity fundraiser in the Last Year: Never true  . Ran Out of Food in the Last Year: Never true  Transportation Needs: No Transportation Needs  . Lack of Transportation (Medical): No  . Lack of Transportation (Non-Medical): No  Physical Activity: Sufficiently Active  . Days of Exercise per Week: 7 days  . Minutes of Exercise per Session: 30 min  Stress: No Stress Concern Present  . Feeling of Stress : Only a little  Social Connections: Moderately Isolated  . Frequency of Communication with Friends and Family: More than three times a week  . Frequency of Social Gatherings with Friends and Family: More than three times a week  . Attends Religious Services: More than 4 times per year  . Active Member of  Clubs or Organizations: No  . Attends Archivist Meetings: Never  . Marital Status: Widowed    Tobacco Counseling Counseling given: Not Answered Comment: Non-smoker   Clinical Intake:  Pre-visit preparation completed: Yes  Pain : 0-10 Pain Score: 5  Pain Type: Chronic pain Pain Location: Generalized (fibromyalgia) Pain Onset: More than a month ago Pain Frequency: Constant     BMI - recorded: 33.83 Nutritional Status: BMI > 30  Obese Nutritional Risks: None Diabetes: Yes CBG done?: No Did pt. bring in CBG monitor from home?: No  How often do you need to have  someone help you when you read instructions, pamphlets, or other written materials from your doctor or pharmacy?: 1 - Never  Nutrition Risk Assessment:  Has the patient had any N/V/D within the last 2 months?  No  Does the patient have any non-healing wounds?  No  Has the patient had any unintentional weight loss or weight gain?  No   Diabetes:  Is the patient diabetic?  Yes  If diabetic, was a CBG obtained today?  No  Did the patient bring in their glucometer from home?  No  How often do you monitor your CBG's? daily.   Financial Strains and Diabetes Management:  Are you having any financial strains with the device, your supplies or your medication? No .  Does the patient want to be seen by Chronic Care Management for management of their diabetes?  No  Would the patient like to be referred to a Nutritionist or for Diabetic Management?  No   Diabetic Exams:  Diabetic Eye Exam: Completed per patient 2022; need records from [facility].   Diabetic Foot Exam: Completed 03/21/20.   Interpreter Needed?: No  Information entered by :: Clemetine Marker LPN   Activities of Daily Living In your present state of health, do you have any difficulty performing the following activities: 02/25/2021 08/08/2020  Hearing? Y N  Comment needs to replace hearing aids -  Vision? N N  Difficulty concentrating  or making decisions? N N  Walking or climbing stairs? N N  Dressing or bathing? N N  Doing errands, shopping? N N  Preparing Food and eating ? N -  Using the Toilet? N -  In the past six months, have you accidently leaked urine? N -  Do you have problems with loss of bowel control? N -  Managing your Medications? N -  Managing your Finances? N -  Housekeeping or managing your Housekeeping? N -  Some recent data might be hidden    Patient Care Team: [name], PA-C as PCP - General (Family Medicine) Germaine Pomfret, [facility] as Pharmacist (Pharmacist)  Indicate any recent Medical Services you may have received from other than [facility] providers in the past year (date may be approximate).     Assessment:   This is a routine wellness examination for [place].  Hearing/Vision screen  Hearing Screening   125Hz  250Hz  500Hz  1000Hz  2000Hz  3000Hz  4000Hz  6000Hz  8000Hz   Right ear:           Left ear:           Comments: Pt c/o moderate hearing difficulty, had a hearing aid but it is currently not working.    Vision Screening Comments: Annual vision screenings done by Dr. Marvel Plan   Dietary issues and exercise activities discussed: Current Exercise Habits: Home exercise routine, Type of exercise: walking, Time (Minutes): 30, Frequency (Times/Week): 7, Weekly Exercise (Minutes/Week): 210, Intensity: Mild, Exercise limited by: neurologic condition(s)  Goals Addressed   None    Depression Screen PHQ 2/9 Scores 02/25/2021 08/08/2020 07/16/2020 03/21/2020 10/16/2019 10/12/2019 08/29/2019  PHQ - 2 Score 0 0 0 0 0 0 0  PHQ- 9 Score - - 0 0 0 - 0    Fall Risk Fall Risk  02/25/2021 08/08/2020 07/16/2020 03/21/2020 10/16/2019  Falls in the past year? 0 0 0 0 0  Number falls in past yr: 0 0 0 0 0  Comment - - - - -  Injury with Fall? 0 0 0 0 0  Risk for fall due  to : No Fall Risks - - - -  Follow up Falls prevention discussed Falls evaluation completed - Falls evaluation completed -    FALL  RISK PREVENTION PERTAINING TO THE HOME:  Any stairs in or around the home? No  If so, are there any without handrails? No  Home free of loose throw rugs in walkways, pet beds, electrical cords, etc? Yes  Adequate lighting in your home to reduce risk of falls? Yes   ASSISTIVE DEVICES UTILIZED TO PREVENT FALLS:  Life alert? No  Use of a cane, walker or w/c? No  Grab bars in the bathroom? Yes  Shower chair or bench in shower? No  Elevated toilet seat or a handicapped toilet? No   TIMED UP AND GO:  Was the test performed? Yes .  Length of time to ambulate 10 feet: 5 sec.   Gait steady and fast without use of assistive device  Cognitive Function:     6CIT Screen 10/12/2019 09/08/2017  What Year? 0 points 0 points  What month? 0 points 0 points  What time? 0 points 3 points  Count back from 20 0 points 0 points  Months in reverse 2 points 0 points  Repeat phrase 0 points 0 points  Total Score 2 3    Immunizations Immunization History  Administered Date(s) Administered  . Influenza, Seasonal, Injecte, Preservative Fre 06/19/2010, 07/07/2012  . Influenza,inj,Quad PF,6+ Mos 08/02/2013, 07/10/2014, 11/02/2016, 07/06/2017, 07/14/2018, 10/12/2019, 07/16/2020  . PFIZER(Purple Top)SARS-COV-2 Vaccination 04/19/2020, 05/10/2020  . Pneumococcal Polysaccharide-23 07/07/2012, 09/08/2017  . Tdap 04/23/2011    TDAP status: Up to date  Flu Vaccine status: Up to date  Pneumococcal vaccine status: Up to date  Covid-19 vaccine status: Completed vaccines  Qualifies for Shingles Vaccine? Yes   Zostavax completed No   Shingrix Completed?: No.    Education has been provided regarding the importance of this vaccine. Patient has been advised to call insurance company to determine out of pocket expense if they have not yet received this vaccine. Advised may also receive vaccine at local pharmacy or Health Dept. Verbalized acceptance and understanding.  Screening Tests Health Maintenance   Topic Date Due  . OPHTHALMOLOGY EXAM  05/06/2018  . COLONOSCOPY (Pts 45-3yrs Insurance coverage will need to be confirmed)  06/02/2019  . COVID-19 Vaccine (3 - Booster for Pfizer series) 10/10/2020  . HEMOGLOBIN A1C  01/14/2021  . FOOT EXAM  03/21/2021  . TETANUS/TDAP  04/22/2021  . INFLUENZA VACCINE  05/05/2021  . PNEUMOCOCCAL POLYSACCHARIDE VACCINE AGE 61-64 HIGH RISK  Completed  . Hepatitis C Screening  Completed  . HIV Screening  Completed  . HPV VACCINES  Aged Out    Health Maintenance  Health Maintenance Due  Topic Date Due  . OPHTHALMOLOGY EXAM  05/06/2018  . COLONOSCOPY (Pts 45-78yrs Insurance coverage will need to be confirmed)  06/02/2019  . COVID-19 Vaccine (3 - Booster for Pfizer series) 10/10/2020  . HEMOGLOBIN A1C  01/14/2021    Colorectal cancer screening: Type of screening: Colonoscopy. Completed 06/01/16. Repeat every 3 years. Referral sent today.   Lung Cancer Screening: (Low Dose CT Chest recommended if Age 75-80 years, 30 pack-year currently smoking OR have quit w/in 15years.) does not qualify.   Additional Screening:  Hepatitis C Screening: does qualify; Completed 09/08/17  Vision Screening: Recommended annual ophthalmology exams for early detection of glaucoma and other disorders of the eye. Is the patient up to date with their annual eye exam?  Yes  Who is the provider or  what is the name of the office in which the patient attends annual eye exams? Dr. Marvel Plan.   Dental Screening: Recommended annual dental exams for proper oral hygiene  Community Resource Referral / Chronic Care Management: CRR required this visit?  No   CCM required this visit?  No      Plan:     I have personally reviewed and noted the following in the patient's chart:   . Medical and social history . Use of alcohol, tobacco or illicit drugs  . Current medications and supplements including opioid prescriptions. Patient is not currently taking opioid  prescriptions. . Functional ability and status . Nutritional status . Physical activity . Advanced directives . List of other physicians . Hospitalizations, surgeries, and ER visits in previous 12 months . Vitals . Screenings to include cognitive, depression, and falls . Referrals and appointments  In addition, I have reviewed and discussed with patient certain preventive protocols, quality metrics, and best practice recommendations. A written personalized care plan for preventive services as well as general preventive health recommendations were provided to patient.     Clemetine Marker, LPN   [id number]   Nurse Notes: pt advised due for OV and labs. Pt to schedule at check out today.

## 2021-02-25 NOTE — Patient Instructions (Signed)
Bryan Flowers , Thank you for taking time to come for your Medicare Wellness Visit. I appreciate your ongoing commitment to your health goals. Please review the following plan we discussed and let me know if I can assist you in the future.   Screening recommendations/referrals: Colonoscopy: done 06/01/16. Referral sent to [facility] today Recommended yearly ophthalmology/optometry visit for glaucoma screening and checkup Recommended yearly dental visit for hygiene and checkup  Vaccinations: Influenza vaccine: done 07/16/20 Pneumococcal vaccine: done 09/08/17 Tdap vaccine: done 04/23/11 Shingles vaccine: Shingrix discussed. Please contact your pharmacy for coverage information.  Covid-19:  Done 04/19/20 & 05/10/20  Advanced directives: Advance directive discussed with you today. Even though you declined this today please call our office should you change your mind and we can give you the proper paperwork for you to fill out.  Conditions/risks identified: Keep up the great work!  Next appointment: Follow up in one year for your annual wellness visit.   Preventive Care 35 Years and Older, Male Preventive care refers to lifestyle choices and visits with your health care provider that can promote health and wellness. What does preventive care include?  A yearly physical exam. This is also called an annual well check.  Dental exams once or twice a year.  Routine eye exams. Ask your health care provider how often you should have your eyes checked.  Personal lifestyle choices, including:  Daily care of your teeth and gums.  Regular physical activity.  Eating a healthy diet.  Avoiding tobacco and drug use.  Limiting alcohol use.  Practicing safe sex.  Taking low doses of aspirin every day.  Taking vitamin and mineral supplements as recommended by your health care provider. What happens during an annual well check? The services and screenings done by your health care  provider during your annual well check will depend on your age, overall health, lifestyle risk factors, and family history of disease. Counseling  Your health care provider may ask you questions about your:  Alcohol use.  Tobacco use.  Drug use.  Emotional well-being.  Home and relationship well-being.  Sexual activity.  Eating habits.  History of falls.  Memory and ability to understand (cognition).  Work and work Statistician. Screening  You may have the following tests or measurements:  Height, weight, and BMI.  Blood pressure.  Lipid and cholesterol levels. These may be checked every 5 years, or more frequently if you are over 47 years old.  Skin check.  Lung cancer screening. You may have this screening every year starting at age 67 if you have a 30-pack-year history of smoking and currently smoke or have quit within the past 15 years.  Fecal occult blood test (FOBT) of the stool. You may have this test every year starting at age 44.  Flexible sigmoidoscopy or colonoscopy. You may have a sigmoidoscopy every 5 years or a colonoscopy every 10 years starting at age 6.  Prostate cancer screening. Recommendations will vary depending on your family history and other risks.  Hepatitis C blood test.  Hepatitis B blood test.  Sexually transmitted disease (STD) testing.  Diabetes screening. This is done by checking your blood sugar (glucose) after you have not eaten for a while (fasting). You may have this done every 1-3 years.  Abdominal aortic aneurysm (AAA) screening. You may need this if you are a current or former smoker.  Osteoporosis. You may be screened starting at age 48 if you are at high risk. Talk with your health care provider  about your test results, treatment options, and if necessary, the need for more tests. Vaccines  Your health care provider may recommend certain vaccines, such as:  Influenza vaccine. This is recommended every year.  Tetanus,  diphtheria, and acellular pertussis (Tdap, Td) vaccine. You may need a Td booster every 10 years.  Zoster vaccine. You may need this after age 9.  Pneumococcal 13-valent conjugate (PCV13) vaccine. One dose is recommended after age 104.  Pneumococcal polysaccharide (PPSV23) vaccine. One dose is recommended after age 12. Talk to your health care provider about which screenings and vaccines you need and how often you need them. This information is not intended to replace advice given to you by your health care provider. Make sure you discuss any questions you have with your health care provider. Document Released: 10/18/2015 Document Revised: 06/10/2016 Document Reviewed: 07/23/2015 Elsevier Interactive Patient Education  2017 [place] Prevention in the Home Falls can cause injuries. They can happen to people of all ages. There are many things you can do to make your home safe and to help prevent falls. What can I do on the outside of my home?  Regularly fix the edges of walkways and driveways and fix any cracks.  Remove anything that might make you trip as you walk through a door, such as a raised step or threshold.  Trim any bushes or trees on the path to your home.  Use bright outdoor lighting.  Clear any walking paths of anything that might make someone trip, such as rocks or tools.  Regularly check to see if handrails are loose or broken. Make sure that both sides of any steps have handrails.  Any raised decks and porches should have guardrails on the edges.  Have any leaves, snow, or ice cleared regularly.  Use sand or salt on walking paths during winter.  Clean up any spills in your garage right away. This includes oil or grease spills. What can I do in the bathroom?  Use night lights.  Install grab bars by the toilet and in the tub and shower. Do not use towel bars as grab bars.  Use non-skid mats or decals in the tub or shower.  If you need to sit down in  the shower, use a plastic, non-slip stool.  Keep the floor dry. Clean up any water that spills on the floor as soon as it happens.  Remove soap buildup in the tub or shower regularly.  Attach bath mats securely with double-sided non-slip rug tape.  Do not have throw rugs and other things on the floor that can make you trip. What can I do in the bedroom?  Use night lights.  Make sure that you have a light by your bed that is easy to reach.  Do not use any sheets or blankets that are too big for your bed. They should not hang down onto the floor.  Have a firm chair that has side arms. You can use this for support while you get dressed.  Do not have throw rugs and other things on the floor that can make you trip. What can I do in the kitchen?  Clean up any spills right away.  Avoid walking on wet floors.  Keep items that you use a lot in easy-to-reach places.  If you need to reach something above you, use a strong step stool that has a grab bar.  Keep electrical cords out of the way.  Do not use floor polish or  wax that makes floors slippery. If you must use wax, use non-skid floor wax.  Do not have throw rugs and other things on the floor that can make you trip. What can I do with my stairs?  Do not leave any items on the stairs.  Make sure that there are handrails on both sides of the stairs and use them. Fix handrails that are broken or loose. Make sure that handrails are as long as the stairways.  Check any carpeting to make sure that it is firmly attached to the stairs. Fix any carpet that is loose or worn.  Avoid having throw rugs at the top or bottom of the stairs. If you do have throw rugs, attach them to the floor with carpet tape.  Make sure that you have a light switch at the top of the stairs and the bottom of the stairs. If you do not have them, ask someone to add them for you. What else can I do to help prevent falls?  Wear shoes that:  Do not have high  heels.  Have rubber bottoms.  Are comfortable and fit you well.  Are closed at the toe. Do not wear sandals.  If you use a stepladder:  Make sure that it is fully opened. Do not climb a closed stepladder.  Make sure that both sides of the stepladder are locked into place.  Ask someone to hold it for you, if possible.  Clearly mark and make sure that you can see:  Any grab bars or handrails.  First and last steps.  Where the edge of each step is.  Use tools that help you move around (mobility aids) if they are needed. These include:  Canes.  Walkers.  Scooters.  Crutches.  Turn on the lights when you go into a dark area. Replace any light bulbs as soon as they burn out.  Set up your furniture so you have a clear path. Avoid moving your furniture around.  If any of your floors are uneven, fix them.  If there are any pets around you, be aware of where they are.  Review your medicines with your doctor. Some medicines can make you feel dizzy. This can increase your chance of falling. Ask your doctor what other things that you can do to help prevent falls. This information is not intended to replace advice given to you by your health care provider. Make sure you discuss any questions you have with your health care provider. Document Released: 07/18/2009 Document Revised: 02/27/2016 Document Reviewed: 10/26/2014 Elsevier Interactive Patient Education  2017 [employer].

## 2021-03-11 DIAGNOSIS — F3181 Bipolar II disorder: Secondary | ICD-10-CM | POA: Diagnosis not present

## 2021-03-13 DIAGNOSIS — G4726 Circadian rhythm sleep disorder, shift work type: Secondary | ICD-10-CM | POA: Diagnosis not present

## 2021-03-13 DIAGNOSIS — G2581 Restless legs syndrome: Secondary | ICD-10-CM | POA: Diagnosis not present

## 2021-03-13 DIAGNOSIS — F3181 Bipolar II disorder: Secondary | ICD-10-CM | POA: Diagnosis not present

## 2021-03-20 ENCOUNTER — Other Ambulatory Visit: Payer: Self-pay

## 2021-03-20 ENCOUNTER — Encounter: Payer: Self-pay | Admitting: Family Medicine

## 2021-03-20 ENCOUNTER — Ambulatory Visit (INDEPENDENT_AMBULATORY_CARE_PROVIDER_SITE_OTHER): Payer: PPO | Admitting: Family Medicine

## 2021-03-20 VITALS — BP 140/80 | HR 98 | Temp 98.0°F | Resp 16 | Ht 64.0 in | Wt 199.8 lb

## 2021-03-20 DIAGNOSIS — M791 Myalgia, unspecified site: Secondary | ICD-10-CM | POA: Diagnosis not present

## 2021-03-20 DIAGNOSIS — E559 Vitamin D deficiency, unspecified: Secondary | ICD-10-CM | POA: Diagnosis not present

## 2021-03-20 DIAGNOSIS — Z5181 Encounter for therapeutic drug level monitoring: Secondary | ICD-10-CM

## 2021-03-20 DIAGNOSIS — Z794 Long term (current) use of insulin: Secondary | ICD-10-CM

## 2021-03-20 DIAGNOSIS — E1165 Type 2 diabetes mellitus with hyperglycemia: Secondary | ICD-10-CM

## 2021-03-20 DIAGNOSIS — F339 Major depressive disorder, recurrent, unspecified: Secondary | ICD-10-CM | POA: Diagnosis not present

## 2021-03-20 DIAGNOSIS — G20 Parkinson's disease: Secondary | ICD-10-CM | POA: Diagnosis not present

## 2021-03-20 DIAGNOSIS — T679XXA Effect of heat and light, unspecified, initial encounter: Secondary | ICD-10-CM

## 2021-03-20 DIAGNOSIS — E119 Type 2 diabetes mellitus without complications: Secondary | ICD-10-CM | POA: Insufficient documentation

## 2021-03-20 DIAGNOSIS — I10 Essential (primary) hypertension: Secondary | ICD-10-CM | POA: Diagnosis not present

## 2021-03-20 DIAGNOSIS — F3112 Bipolar disorder, current episode manic without psychotic features, moderate: Secondary | ICD-10-CM

## 2021-03-20 DIAGNOSIS — Z1211 Encounter for screening for malignant neoplasm of colon: Secondary | ICD-10-CM

## 2021-03-20 DIAGNOSIS — E7800 Pure hypercholesterolemia, unspecified: Secondary | ICD-10-CM

## 2021-03-20 DIAGNOSIS — E875 Hyperkalemia: Secondary | ICD-10-CM | POA: Diagnosis not present

## 2021-03-20 DIAGNOSIS — E669 Obesity, unspecified: Secondary | ICD-10-CM | POA: Diagnosis not present

## 2021-03-20 DIAGNOSIS — M62838 Other muscle spasm: Secondary | ICD-10-CM | POA: Diagnosis not present

## 2021-03-20 DIAGNOSIS — E0865 Diabetes mellitus due to underlying condition with hyperglycemia: Secondary | ICD-10-CM | POA: Diagnosis not present

## 2021-03-20 DIAGNOSIS — IMO0002 Reserved for concepts with insufficient information to code with codable children: Secondary | ICD-10-CM

## 2021-03-20 DIAGNOSIS — E782 Mixed hyperlipidemia: Secondary | ICD-10-CM | POA: Diagnosis not present

## 2021-03-20 DIAGNOSIS — Z6834 Body mass index (BMI) 34.0-34.9, adult: Secondary | ICD-10-CM

## 2021-03-20 DIAGNOSIS — E860 Dehydration: Secondary | ICD-10-CM | POA: Diagnosis not present

## 2021-03-20 MED ORDER — ROSUVASTATIN CALCIUM 40 MG PO TABS
40.0000 mg | ORAL_TABLET | Freq: Every day | ORAL | 1 refills | Status: DC
Start: 2021-03-20 — End: 2023-06-30

## 2021-03-20 MED ORDER — SYNJARDY 12.5-1000 MG PO TABS: 1.0000 | ORAL_TABLET | Freq: Two times a day (BID) | ORAL | 1 refills | Status: DC

## 2021-03-20 MED ORDER — INSULIN PEN NEEDLE 32G X 6 MM MISC: 1.0000 | Freq: Every day | 3 refills | Status: DC

## 2021-03-20 MED ORDER — INSULIN DEGLUDEC 100 UNIT/ML ~~LOC~~ SOPN: 50.0000 [IU] | PEN_INJECTOR | Freq: Every day | SUBCUTANEOUS | 2 refills | Status: DC

## 2021-03-20 MED ORDER — QUINAPRIL HCL 20 MG PO TABS: 20.0000 mg | ORAL_TABLET | Freq: Every day | ORAL | 1 refills | Status: DC

## 2021-03-20 NOTE — Progress Notes (Signed)
Name: Bryan Flowers   MRN: [id number]    DOB: [birth year]   Date:03/20/2021       Progress Note  Chief Complaint  Patient presents with   Hypertension   Diabetes   Hyperlipidemia     Subjective:   Bryan Flowers is a 60 y.o. male, presents to clinic for routine f/up  Hypertension:  Currently managed on quinapril propranolol and amlodipine Questionable compliance Blood pressure today is a little elevated BP Readings from Last 3 Encounters:  03/20/21 140/80  02/25/21 138/80  07/16/20 124/86   Pt denies CP, SOB, exertional sx, LE edema, palpitation, Ha's, visual disturbances, lightheadedness, hypotension, syncope. Dietary efforts for BP?  none   Uncontrolled DM50 units of tresiba - poor compliance in the past, he states  Lab Results  Component Value Date   HGBA1C 8.2 (H) 07/16/2020  On tresiba, synjardy  Due for eye exam  HLD - crestor 40 mg - poor compliance in the past due to financial difficulties - [facility] pharmacy tried to help with home delivery but pt did not like and preferred to return to [facility] - unclear if he started taking statin, he states he has Lab Results  Component Value Date   CHOL 243 (H) 07/16/2020   HDL 44 07/16/2020   LDLCALC 158 (H) 07/16/2020   TRIG 242 (H) 07/16/2020   CHOLHDL 5.5 (H) 07/16/2020    Pt was out in the heat yesterday and has felt ill since, a little sweaty and shakey - he has pushed fluids, some muscle spasms and cramps in his legs, he is peeing normally, denies dark urine color Denies CP, SOB, palpitations, near syncope Gait is a little unsteady from his baseline. No N/V HA, vision changes      Current Outpatient Medications:    acetaminophen (TYLENOL) 500 MG tablet, Take 1,000 mg by mouth every 6 (six) hours as needed (pain)., Disp: , Rfl:    amLODipine (NORVASC) 5 MG tablet, Take 1 tablet (5 mg total) by mouth daily., Disp: 90 tablet, Rfl: 3   Armodafinil 250 MG tablet, Take by mouth in the morning. , Disp: , Rfl:     b complex vitamins capsule, Take 1 capsule by mouth daily., Disp: , Rfl:    carbidopa-levodopa (SINEMET CR) 50-200 MG per tablet, Take 1 tablet by mouth 3 (three) times daily. , Disp: , Rfl:    cholecalciferol (VITAMIN D3) 25 MCG (1000 UT) tablet, Take 1,000 Units by mouth daily., Disp: , Rfl:    DULoxetine (CYMBALTA) 60 MG capsule, Take 120 mg by mouth at bedtime., Disp: , Rfl:    Empagliflozin-metFORMIN HCl (SYNJARDY) 12.02-999 MG TABS, Take 1 tablet by mouth 2 (two) times daily with a meal., Disp: 180 tablet, Rfl: 1   glucose blood (ONETOUCH VERIO) test strip, DX:E11.69, LON:99 Check TID, Disp: 100 each, Rfl: 12   glucose blood test strip, Check once daily fasting, and up to 3 times a day as needed., Disp: 100 each, Rfl: 12   insulin degludec (TRESIBA) 100 UNIT/ML FlexTouch Pen, Inject 50-80 Units into the skin daily. Per MD/PA instruction, Disp: 15 mL, Rfl: 2   Insulin Pen Needle (NOVOFINE) 32G X 6 MM MISC, 1 each by Does not apply route daily., Disp: 100 each, Rfl: 3   lamoTRIgine (LAMICTAL) 200 MG tablet, Take 400 mg by mouth at bedtime. , Disp: , Rfl:    LORazepam (ATIVAN) 1 MG tablet, Typically takes  1 to 2 times daily, Disp: , Rfl:  pregabalin (LYRICA) 50 MG capsule, TAKE ONE CAPSULE BY MOUTH TWICE DAILY, Disp: 60 capsule, Rfl: 0   propranolol ER (INDERAL LA) 60 MG 24 hr capsule, Take 60 mg by mouth at bedtime. , Disp: , Rfl:    QUEtiapine (SEROQUEL) 300 MG tablet, Take 300 mg by mouth at bedtime., Disp: , Rfl:    quinapril (ACCUPRIL) 20 MG tablet, Take 1 tablet (20 mg total) by mouth at bedtime., Disp: 90 tablet, Rfl: 1   rOPINIRole (REQUIP) 2 MG tablet, Take 2 mg by mouth 2 (two) times daily., Disp: , Rfl:    rosuvastatin (CRESTOR) 40 MG tablet, Take 1 tablet (40 mg total) by mouth at bedtime., Disp: 90 tablet, Rfl: 1  Patient Active Problem List   Diagnosis Date Noted   Morbid obesity ([place]) 02/16/2019   Pure hypercholesterolemia 02/16/2019   Grief 02/16/2019   Major depression,  recurrent ([place]) 03/14/2018   Bipolar 1 disorder, manic, moderate (HCC)    Vitamin D deficiency 09/10/2017   Gastroesophageal reflux disease without esophagitis 09/10/2017   History of iron deficiency anemia 03/30/2016   History of iron deficiency anemia 03/24/2016   History of GI bleed 03/15/2016   Hyponatremia 03/15/2016   Parkinson disease ([place])    Hypertension    Anxiety 08/19/2015   Abnormal ECG 04/09/2015   Back ache 04/09/2015   Diabetes mellitus type 2 in obese ([place]) 04/09/2015   Obstructive sleep apnea 04/09/2015   HDL deficiency 04/09/2015   Hearing loss, sensorineural, combined types 04/09/2015   HLD (hyperlipidemia) 04/09/2015   Vocal cord atrophy 05/29/2014   Fibromyalgia 03/15/2014   Testicular hypofunction 05/17/2009   Migraine with aura 05/14/2009    Past Surgical History:  Procedure Laterality Date   ESOPHAGOGASTRODUODENOSCOPY Left 03/16/2016   Procedure: ESOPHAGOGASTRODUODENOSCOPY (EGD);  Surgeon: Manus Gunning, MD;  Location: [facility];  Service: Gastroenterology;  Laterality: Left;   FOOT FRACTURE SURGERY Right     Family History  Problem Relation Age of Onset   Cancer Mother    Heart disease Father    Arthritis Brother     Social History   Tobacco Use   Smoking status: Never   Smokeless tobacco: Never   Tobacco comments:    Non-smoker  Vaping Use   Vaping Use: Never used  Substance Use Topics   Alcohol use: No    Alcohol/week: 0.0 standard drinks   Drug use: No     No Known Allergies  Health Maintenance  Topic Date Due   Pneumococcal Vaccine 17-73 Years old (1 - PCV) Never done   Zoster Vaccines- Shingrix (1 of 2) Never done   OPHTHALMOLOGY EXAM  05/06/2018   COLONOSCOPY (Pts 45-74yrs Insurance coverage will need to be confirmed)  06/02/2019   COVID-19 Vaccine (3 - Pfizer risk series) 06/07/2020   HEMOGLOBIN A1C  01/14/2021   FOOT EXAM  03/21/2021   TETANUS/TDAP  04/22/2021   INFLUENZA VACCINE  05/05/2021   PNEUMOCOCCAL  POLYSACCHARIDE VACCINE AGE 70-64 HIGH RISK  Completed   Hepatitis C Screening  Completed   HIV Screening  Completed   HPV VACCINES  Aged Out    Chart Review Today: I personally reviewed active problem list, medication list, allergies, family history, social history, health maintenance, notes from last encounter, lab results, imaging with the patient/caregiver today.   Review of Systems  Constitutional:  Positive for diaphoresis and fatigue. Negative for appetite change, chills, fever and unexpected weight change.  HENT: Negative.    Eyes: Negative.   Respiratory: Negative.  Negative for chest tightness, shortness of breath and wheezing.   Cardiovascular: Negative.  Negative for chest pain and palpitations.  Gastrointestinal: Negative.   Endocrine: Negative.   Genitourinary: Negative.   Musculoskeletal: Negative.   Skin: Negative.   Allergic/Immunologic: Negative.   Neurological:  Positive for weakness. Negative for dizziness, seizures, syncope, facial asymmetry, speech difficulty, light-headedness, numbness and headaches.  Hematological: Negative.   Psychiatric/Behavioral: Negative.    All other systems reviewed and are negative.   Objective:   Vitals:   03/20/21 1556 03/20/21 1604  BP: (!) 142/80 140/80  Pulse: 98   Resp: 16   Temp: 98 F (36.7 C)   SpO2: 99%   Weight: 199 lb 12.8 oz (90.6 kg)   Height: 5\' 4"  (1.626 m)     Body mass index is 34.3 kg/m.  Physical Exam Vitals and nursing note reviewed.  Constitutional:      General: He is not in acute distress.    Appearance: He is obese. He is diaphoretic. He is not ill-appearing or toxic-appearing.  HENT:     Head: Normocephalic and atraumatic.     Right Ear: External ear normal.     Left Ear: External ear normal.     Mouth/Throat:     Mouth: Mucous membranes are moist.     Pharynx: Oropharynx is clear. No oropharyngeal exudate or posterior oropharyngeal erythema.  Eyes:     General: No scleral icterus.        Right eye: No discharge.        Left eye: No discharge.     Conjunctiva/sclera: Conjunctivae normal.     Pupils: Pupils are equal, round, and reactive to light.  Cardiovascular:     Rate and Rhythm: Normal rate and regular rhythm.     Pulses: Normal pulses.     Heart sounds: Murmur heard.    No friction rub. No gallop.  Pulmonary:     Effort: No respiratory distress.     Breath sounds: Normal breath sounds. No stridor. No wheezing, rhonchi or rales.  Abdominal:     General: Bowel sounds are normal.     Palpations: Abdomen is soft.  Musculoskeletal:        General: No tenderness.     Cervical back: Normal range of motion.  Skin:    General: Skin is warm.     Coloration: Skin is not jaundiced or pale.     Findings: Rash present. No erythema.  Neurological:     Mental Status: He is alert. Mental status is at baseline.     Cranial Nerves: Cranial nerves are intact.     Sensory: Sensation is intact.     Motor: No tremor or seizure activity.     Gait: Gait abnormal (slightly unsteady gait).        Assessment & Plan:     ICD-10-CM   1. Essential hypertension  G38 COMPLETE METABOLIC PANEL WITH GFR    quinapril (ACCUPRIL) 20 MG tablet   BP a little elevated today, likely he is out of meds - ACEI should be out as of 2 months ago - refills ordered    2. Pure hypercholesterolemia  E78.00 rosuvastatin (CRESTOR) 40 MG tablet    Lipid panel   prior poor statin compliance, med dosing increased, pt states he is now taking, recheck labs    3. Insulin dependent type 2 diabetes mellitus, uncontrolled (HCC)  E11.65 insulin degludec (TRESIBA) 100 UNIT/ML FlexTouch Pen   Z79.4 Empagliflozin-metFORMIN HCl (SYNJARDY) 12.02-999 MG  TABS    rosuvastatin (CRESTOR) 40 MG tablet    Hemoglobin A1C    Insulin Pen Needle (NOVOFINE) 32G X 6 MM MISC    Ambulatory referral to Ophthalmology   uncontrolled, poor med compliance in the past, pt states he is doing 50 units insulin and taking synjardy -  sugars 120-150's recheck A1C    4. Parkinson disease ([place]) Chronic G20    needs f/up with Dr. [name] [facility] neuro    5. Bipolar 1 disorder, manic, moderate (HCC) Chronic F31.12    mood good, at pt's baseline, per psych NP    6. Class 1 obesity with serious comorbidity and body mass index (BMI) of 34.0 to 34.9 in adult, unspecified obesity type  E66.9    Z68.34     7. Recurrent major depressive disorder, remission status unspecified ([place])  F33.9    managed by specialist - mood good    8. Vitamin D deficiency  E55.9    last labs near normal, continue OTC supplement    9. Encounter for medication monitoring  Z51.81     10. Myalgia  M79.10 CK (Creatine Kinase)    11. Muscle spasms of both lower extremities  M62.838 CK (Creatine Kinase)   checking labs, encouraged him to get oral rehydration solution from his pharmacy, needs to go to the ER if any severe worsening, muscle compartments soft    12. Heat exposure, initial encounter  T67.9XXA CK (Creatine Kinase)   feels ill since yesterday, check renal function, electrolytes CK if able, mucous membranes moist, VSS, encouraged resting and pushing fluids/electrolytes    13. Screening for malignant neoplasm of colon  Z12.11 Ambulatory referral to Gastroenterology     I did discuss with the pt if he has any worsening weakness or feels like he may have LOC to go to the ER for further assessment and IVF VSS here, able to ambulate Tolerating PO's, encouraged him to push oral rehydration solution that is low in sugar Hopefully renal function, electrolytes and CK will be near normal - he appeared well hydrated clinically today.  Return in about 3 months (around 06/20/2021) for for Insulin dependent Diabetes and repeat A1C.   [name], PA-C 03/20/21 4:07 PM

## 2021-03-21 LAB — COMPLETE METABOLIC PANEL WITH GFR
AG Ratio: 2 (calc) (ref 1.0–2.5)
ALT: 16 U/L (ref 9–46)
AST: 13 U/L (ref 10–35)
Albumin: 4.3 g/dL (ref 3.6–5.1)
Alkaline phosphatase (APISO): 65 U/L (ref 35–144)
BUN: 17 mg/dL (ref 7–25)
CO2: 24 mmol/L (ref 20–32)
Calcium: 9.2 mg/dL (ref 8.6–10.3)
Chloride: 102 mmol/L (ref 98–110)
Creat: 0.84 mg/dL (ref 0.70–1.33)
GFR, Est African American: 111 mL/min/{1.73_m2} (ref >=60)
GFR, Est Non African American: 96 mL/min/{1.73_m2} (ref >=60)
Globulin: 2.2 g/dL (calc) (ref 1.9–3.7)
Glucose, Bld: 264 mg/dL — ABNORMAL HIGH (ref 65–139)
Potassium: 4.3 mmol/L (ref 3.5–5.3)
Sodium: 136 mmol/L (ref 135–146)
Total Bilirubin: 0.4 mg/dL (ref 0.2–1.2)
Total Protein: 6.5 g/dL (ref 6.1–8.1)

## 2021-03-21 LAB — LIPID PANEL
Cholesterol: 208 mg/dL — ABNORMAL HIGH (ref <200)
HDL: 46 mg/dL (ref >=40)
LDL Cholesterol (Calc): 138 mg/dL (calc) — ABNORMAL HIGH
Non-HDL Cholesterol (Calc): 162 mg/dL (calc) — ABNORMAL HIGH (ref <130)
Total CHOL/HDL Ratio: 4.5 (calc) (ref <5.0)
Triglycerides: 122 mg/dL (ref <150)

## 2021-03-21 LAB — HEMOGLOBIN A1C
Hgb A1c MFr Bld: 11.3 % of total Hgb — ABNORMAL HIGH (ref <5.7)
Mean Plasma Glucose: 278 mg/dL
eAG (mmol/L): 15.4 mmol/L

## 2021-03-21 LAB — CK: Total CK: 77 U/L (ref 44–196)

## 2021-03-24 ENCOUNTER — Telehealth: Payer: Self-pay

## 2021-03-24 NOTE — Telephone Encounter (Signed)
Copied from [place] [phone]. Topic: General - Other >> Mar 24, 2021  1:09 PM Pawlus, Brayton Layman A wrote: Reason for CRM: Pt was calling back to go over his latest lab results, please advise.

## 2021-03-24 NOTE — Telephone Encounter (Signed)
Patient called. Results relayed. Patient verbalized understanding and compliance with medications with no reports of non-compliance. Patient transferred to front desk for scheduling.

## 2021-03-24 NOTE — Addendum Note (Signed)
Addended by: [name] on: 03/24/2021 12:26 PM   Modules accepted: Orders

## 2021-03-31 ENCOUNTER — Encounter: Payer: Self-pay | Admitting: Gastroenterology

## 2021-04-17 ENCOUNTER — Telehealth: Payer: Self-pay | Admitting: *Deleted

## 2021-04-17 ENCOUNTER — Encounter: Payer: Self-pay | Admitting: Unknown Physician Specialty

## 2021-04-17 ENCOUNTER — Other Ambulatory Visit: Payer: Self-pay

## 2021-04-17 ENCOUNTER — Ambulatory Visit (INDEPENDENT_AMBULATORY_CARE_PROVIDER_SITE_OTHER): Payer: PPO | Admitting: Unknown Physician Specialty

## 2021-04-17 DIAGNOSIS — Z794 Long term (current) use of insulin: Secondary | ICD-10-CM | POA: Diagnosis not present

## 2021-04-17 DIAGNOSIS — IMO0002 Reserved for concepts with insufficient information to code with codable children: Secondary | ICD-10-CM

## 2021-04-17 DIAGNOSIS — M797 Fibromyalgia: Secondary | ICD-10-CM | POA: Diagnosis not present

## 2021-04-17 DIAGNOSIS — E1165 Type 2 diabetes mellitus with hyperglycemia: Secondary | ICD-10-CM | POA: Diagnosis not present

## 2021-04-17 MED ORDER — METFORMIN HCL 1000 MG PO TABS: 1000.0000 mg | ORAL_TABLET | Freq: Two times a day (BID) | ORAL | 3 refills | Status: DC

## 2021-04-17 MED ORDER — RYBELSUS 3 MG PO TABS: 3.0000 mg | ORAL_TABLET | Freq: Every day | ORAL | 0 refills | Status: DC

## 2021-04-17 MED ORDER — PREGABALIN 50 MG PO CAPS
50.0000 mg | ORAL_CAPSULE | Freq: Two times a day (BID) | ORAL | 12 refills | Status: DC
Start: 2021-04-17 — End: 2021-09-15

## 2021-04-17 MED ORDER — BASAGLAR KWIKPEN 100 UNIT/ML ~~LOC~~ SOPN: 50.0000 [IU] | PEN_INJECTOR | Freq: Every day | SUBCUTANEOUS | 12 refills | Status: DC

## 2021-04-17 NOTE — Chronic Care Management (AMB) (Signed)
  Chronic Care Management   Note  04/17/2021 Name: Bryan Flowers MRN: [id number] DOB: 02/10/1961  Bryan Flowers is a 60 y.o. year old male who is a primary care patient of [name], [place]. I reached out to Bryan Flowers by phone today in response to a referral sent by Mr. Gearlean Alf PCP, [name], PA-C      Mr. Levick was given information about Chronic Care Management services today including:  CCM service includes personalized support from designated clinical staff supervised by his physician, including individualized plan of care and coordination with other care providers 24/7 contact phone numbers for assistance for urgent and routine care needs. Service will only be billed when office clinical staff spend 20 minutes or more in a month to coordinate care. Only one practitioner may furnish and bill the service in a calendar month. The patient may stop CCM services at any time (effective at the end of the month) by phone call to the office staff. The patient will be responsible for cost sharing (co-pay) of up to 20% of the service fee (after annual deductible is met).  Patient agreed to services and verbal consent obtained.   Follow up plan: Face to Face appointment with care management team member scheduled for: 04/30/2021  [facility]

## 2021-04-17 NOTE — Assessment & Plan Note (Signed)
Pt with non-adherance due to cost.  DC synjardy and rx Metformin 1000 mg BID.  DC Tyler Aas and [employer], broadcasting/film/video.  Sample given of Rybelsus to take daily.  Follow-up in 1 month

## 2021-04-17 NOTE — Progress Notes (Signed)
BP 132/84   Pulse 80   Temp 98.1 F (36.7 C) (Oral)   Resp 16   Ht 5\' 4"  (1.626 m)   Wt 198 lb 1.6 oz (89.9 kg)   SpO2 98%   BMI 34.00 kg/m    Subjective:    Patient ID: Bryan Flowers, male    DOB: 1960/12/01, 60 y.o.   MRN: [id number]  HPI: Bryan Flowers is a 60 y.o. male  Chief Complaint  Patient presents with   Follow-up    1 month recheck   Pt is here to discuss blood sugar.  His last Hgb A1C was 11.3 one month ago.  Takes 50 units of Tresiba QHS and takes [place].  States his blood sugars are around 200 in the AM.  Has an appt with Endocrine in September.  States he does "forget" to take his medicationmost of the time. States he struggles with taking his medication due to cost. [facility] care management was unable to make contact  Pt takes Lyrica for Fibromyalgia.  Does well with it and would like a refill  Relevant past medical, surgical, family and social history reviewed and updated as indicated. Interim medical history since our last visit reviewed. Allergies and medications reviewed and updated.  Review of Systems  Per HPI unless specifically indicated above     Objective:    BP 132/84   Pulse 80   Temp 98.1 F (36.7 C) (Oral)   Resp 16   Ht 5\' 4"  (1.626 m)   Wt 198 lb 1.6 oz (89.9 kg)   SpO2 98%   BMI 34.00 kg/m   Wt Readings from Last 3 Encounters:  04/17/21 198 lb 1.6 oz (89.9 kg)  03/20/21 199 lb 12.8 oz (90.6 kg)  02/25/21 197 lb 1.6 oz (89.4 kg)    Physical Exam Constitutional:      General: He is not in acute distress.    Appearance: Normal appearance. He is well-developed.  HENT:     Head: Normocephalic and atraumatic.  Eyes:     General: Lids are normal. No scleral icterus.       Right eye: No discharge.        Left eye: No discharge.     Conjunctiva/sclera: Conjunctivae normal.  Cardiovascular:     Rate and Rhythm: Normal rate.  Pulmonary:     Effort: Pulmonary effort is normal.  Abdominal:     Palpations: There is no  hepatomegaly or splenomegaly.  Musculoskeletal:        General: Normal range of motion.  Skin:    Coloration: Skin is not pale.     Findings: No rash.  Neurological:     Mental Status: He is alert and oriented to person, place, and time.  Psychiatric:        Behavior: Behavior normal.        Thought Content: Thought content normal.        Judgment: Judgment normal.    Results for orders placed or performed in visit on 03/20/21  Hemoglobin A1C  Result Value Ref Range   Hgb A1c MFr Bld 11.3 (H) <5.7 % of total Hgb   Mean Plasma Glucose 278 mg/dL   eAG (mmol/L) 15.4 mmol/L  COMPLETE METABOLIC PANEL WITH GFR  Result Value Ref Range   Glucose, Bld 264 (H) 65 - 139 mg/dL   BUN 17 7 - 25 mg/dL   Creat 0.84 0.70 - 1.33 mg/dL   GFR, Est Non African American 96 >  OR = 60 mL/min/1.58m2   GFR, Est African American 111 > OR = 60 mL/min/1.24m2   BUN/Creatinine Ratio NOT APPLICABLE 6 - 22 (calc)   Sodium 136 135 - 146 mmol/L   Potassium 4.3 3.5 - 5.3 mmol/L   Chloride 102 98 - 110 mmol/L   CO2 24 20 - 32 mmol/L   Calcium 9.2 8.6 - 10.3 mg/dL   Total Protein 6.5 6.1 - 8.1 g/dL   Albumin 4.3 3.6 - 5.1 g/dL   Globulin 2.2 1.9 - 3.7 g/dL (calc)   AG Ratio 2.0 1.0 - 2.5 (calc)   Total Bilirubin 0.4 0.2 - 1.2 mg/dL   Alkaline phosphatase (APISO) 65 35 - 144 U/L   AST 13 10 - 35 U/L   ALT 16 9 - 46 U/L  Lipid panel  Result Value Ref Range   Cholesterol 208 (H) <200 mg/dL   HDL 46 > OR = 40 mg/dL   Triglycerides 122 <150 mg/dL   LDL Cholesterol (Calc) 138 (H) mg/dL (calc)   Total CHOL/HDL Ratio 4.5 <5.0 (calc)   Non-HDL Cholesterol (Calc) 162 (H) <130 mg/dL (calc)  CK  Result Value Ref Range   Total CK 77 44 - 196 U/L      Assessment & Plan:   Problem List Items Addressed This Visit       Unprioritized   Fibromyalgia    Refilled Lyrica.  Doing well       Relevant Medications   pregabalin (LYRICA) 50 MG capsule   Insulin dependent type 2 diabetes mellitus, uncontrolled  ([place])    Pt with non-adherance due to cost.  DC synjardy and rx Metformin 1000 mg BID.  DC Tyler Aas and [employer], broadcasting/film/video.  Sample given of Rybelsus to take daily.  Follow-up in 1 month       Relevant Medications   metFORMIN (GLUCOPHAGE) 1000 MG tablet   Insulin Glargine (BASAGLAR KWIKPEN) 100 UNIT/ML   Semaglutide (RYBELSUS) 3 MG TABS     Follow up plan: Return in about 4 weeks (around 05/15/2021) for Diabetes.

## 2021-04-17 NOTE — Assessment & Plan Note (Signed)
Refilled Lyrica.  Doing well

## 2021-04-23 ENCOUNTER — Telehealth: Payer: Self-pay

## 2021-04-23 NOTE — Progress Notes (Signed)
Chronic Care Management Pharmacy Assistant   Name: Bryan Flowers  MRN: [id number] DOB: [birth year]  Initial Visit with Junius Argyle, CPP 04/30/2021 @ 0900   Conditions to be addressed/monitored: HTN, HLD, DMII, Anxiety, Depression, Bipolar Disorder, and Fibromyalgia, Pure hypercholesterolemia  Primary concerns for visit include: Patient never returned calls unable to answer   Recent office visits: 04/17/2021 Kathrine Haddock, NP (PCP Office Visit) for Follow-up- Iva Boop was discontinued and patient started on Metformin 1000 mg BID. Also per note Tyler Aas and Nancee Liter was discontinued. Started Insulin glargine 50 units daily, Semaglutide 3 mg Daily. Patient instructed to follow-up in 4 months.   03/20/2021 Dorna Leitz, PA-C (PCP Office Visit) for HTN, DM, HLD-  No medication changes noted; Referral to Gastroenterology, Ophthalmology, and Endocrinology  Recent consult visits:  No New Consult visits  Hospital visits:  None in previous 6 months  Medications: Outpatient Encounter Medications as of 04/23/2021  Medication Sig   acetaminophen (TYLENOL) 500 MG tablet Take 1,000 mg by mouth every 6 (six) hours as needed (pain).   amLODipine (NORVASC) 5 MG tablet Take 1 tablet (5 mg total) by mouth daily.   Armodafinil 250 MG tablet Take by mouth in the morning.    b complex vitamins capsule Take 1 capsule by mouth daily.   carbidopa-levodopa (SINEMET CR) 50-200 MG per tablet Take 1 tablet by mouth 3 (three) times daily.    cholecalciferol (VITAMIN D3) 25 MCG (1000 UT) tablet Take 1,000 Units by mouth daily.   DULoxetine (CYMBALTA) 60 MG capsule Take 120 mg by mouth at bedtime.   glucose blood (ONETOUCH VERIO) test strip DX:E11.69, LON:99 Check TID   glucose blood test strip Check once daily fasting, and up to 3 times a day as needed.   insulin degludec (TRESIBA) 100 UNIT/ML FlexTouch Pen Inject 50-80 Units into the skin daily. Per MD/PA instruction   Insulin Glargine (BASAGLAR KWIKPEN)  100 UNIT/ML Inject 50 Units into the skin daily.   Insulin Pen Needle (NOVOFINE) 32G X 6 MM MISC 1 each by Does not apply route daily.   lamoTRIgine (LAMICTAL) 200 MG tablet Take 400 mg by mouth at bedtime.    LORazepam (ATIVAN) 1 MG tablet Typically takes  1 to 2 times daily   metFORMIN (GLUCOPHAGE) 1000 MG tablet Take 1 tablet (1,000 mg total) by mouth 2 (two) times daily with a meal.   pregabalin (LYRICA) 50 MG capsule Take 1 capsule (50 mg total) by mouth 2 (two) times daily.   propranolol ER (INDERAL LA) 60 MG 24 hr capsule Take 60 mg by mouth at bedtime.    QUEtiapine (SEROQUEL) 300 MG tablet Take 300 mg by mouth at bedtime.   quinapril (ACCUPRIL) 20 MG tablet Take 1 tablet (20 mg total) by mouth at bedtime.   rOPINIRole (REQUIP) 2 MG tablet Take 2 mg by mouth 2 (two) times daily.   rosuvastatin (CRESTOR) 40 MG tablet Take 1 tablet (40 mg total) by mouth at bedtime.   Semaglutide (RYBELSUS) 3 MG TABS Take 3 mg by mouth daily.   No facility-administered encounter medications on file as of 04/23/2021.   Care Gaps: Pneumococcal Vaccine (Last 09/18/2017) COLONOSCOPY (last completed 06/01/2016) COVID-19 Vaccine Dose 3 FOOT EXAM (last completed 03/21/2020) TETANUS/TDAP (last completed 04/23/2011) OPHTHALMOLOGY EXAM  Star Rating Drugs: Semaglutide 3 mg no fills noted Metformin 1000 mg last filled on 07/01/2018 for a 30 day supply with [facility] Quinapril 20 mg last filled on 06/14/2020 for a 20 day supply with [facility]  Rosuvastatin 40 mg last filled on 06/14/2020 for a 20 day supply with [facility]   05/214/2022- AWV completed  07/21 The automated system just stated the wirless customer I was calling wasn't available. Unable to [facility] 07/22 The automated system stated the wireless customer I am calling is not available unable to LVM 07/25 The automated system stated the wireless customer I am calling is not available unable to LVM  I have attempted without success  to contact this patient by phone three times to do his Initial Assessment call prior to his appointment with Junius Argyle, CPP. I have been unable to LVM due to no VM setup and system just states the caller is not available at this time.  Lynann Bologna, CPA/CMA Clinical Pharmacist Assistant Phone: [phone]

## 2021-04-30 ENCOUNTER — Ambulatory Visit: Payer: PPO

## 2021-04-30 NOTE — Progress Notes (Deleted)
Chronic Care Management Pharmacy Note  04/30/2021 Name:  ASIF MUCHOW MRN:  [id number] DOB:  1960/12/17  Summary: ***  Recommendations/Changes made from today's visit: ***  Plan: ***   Subjective: RAMESH MOAN is an 60 y.o. year old male who is a primary patient of [name], [place].  The CCM team was consulted for assistance with disease management and care coordination needs.    Engaged with patient by telephone for initial visit in response to provider referral for pharmacy case management and/or care coordination services.   Consent to Services:  The patient was given the following information about Chronic Care Management services today, agreed to services, and gave verbal consent: 1. CCM service includes personalized support from designated clinical staff supervised by the primary care provider, including individualized plan of care and coordination with other care providers 2. 24/7 contact phone numbers for assistance for urgent and routine care needs. 3. Service will only be billed when office clinical staff spend 20 minutes or more in a month to coordinate care. 4. Only one practitioner may furnish and bill the service in a calendar month. 5.The patient may stop CCM services at any time (effective at the end of the month) by phone call to the office staff. 6. The patient will be responsible for cost sharing (co-pay) of up to 20% of the service fee (after annual deductible is met). Patient agreed to services and consent obtained.  Patient Care Team: [name], PA-C as PCP - General (Family Medicine) Germaine Pomfret, [facility] as Pharmacist (Pharmacist)  Recent office visits: 04/17/2021 Kathrine Haddock, NP (PCP Office Visit) for Follow-up- Iva Boop was discontinued and patient started on Metformin 1000 mg BID. Also per note Tyler Aas and Nancee Liter was discontinued. Started Insulin glargine 50 units daily, Semaglutide 3 mg Daily. Patient instructed to follow-up in 4 months.    03/20/2021 Dorna Leitz, PA-C (PCP Office Visit) for HTN, DM, HLD-  No medication changes noted; Referral to Gastroenterology, Ophthalmology, and Endocrinology  Recent consult visits: None in previous 6 months  Hospital visits: None in previous 6 months   Objective:  Lab Results  Component Value Date   CREATININE 0.84 03/20/2021   BUN 17 03/20/2021   GFRNONAA 96 03/20/2021   GFRAA 111 03/20/2021   NA 136 03/20/2021   K 4.3 03/20/2021   CALCIUM 9.2 03/20/2021   CO2 24 03/20/2021   GLUCOSE 264 (H) 03/20/2021    Lab Results  Component Value Date/Time   HGBA1C 11.3 (H) 03/20/2021 12:00 AM   HGBA1C 8.2 (H) 07/16/2020 04:01 PM   HGBA1C 10.5 (A) 11/14/2018 02:04 PM   HGBA1C 6.3 07/14/2018 01:25 PM   FRUCTOSAMINE 469 (H) 04/09/2015 09:34 AM   MICROALBUR 1.2 09/12/2019 12:00 AM   MICROALBUR 20 03/14/2018 02:22 PM   MICROALBUR <0.2 07/02/2017 08:11 AM   MICROALBUR NEG 03/12/2016 03:16 PM    Last diabetic Eye exam: No results found for: HMDIABEYEEXA  Last diabetic Foot exam: No results found for: HMDIABFOOTEX   Lab Results  Component Value Date   CHOL 208 (H) 03/20/2021   HDL 46 03/20/2021   LDLCALC 138 (H) 03/20/2021   TRIG 122 03/20/2021   CHOLHDL 4.5 03/20/2021    Hepatic Function Latest Ref Rng & Units 03/20/2021 07/16/2020 03/21/2020  Total Protein 6.1 - 8.1 g/dL 6.5 6.6 7.3  Albumin 3.5 - 5.0 g/dL - - -  AST 10 - 35 U/L 13 12 14   ALT 9 - 46 U/L 16 16 21   Alk Phosphatase  38 - 126 U/L - - -  Total Bilirubin 0.2 - 1.2 mg/dL 0.4 0.4 0.5    Lab Results  Component Value Date/Time   TSH 1.27 02/16/2019 01:52 PM   TSH 1.41 05/18/2016 03:52 PM    CBC Latest Ref Rng & Units 07/16/2020 10/17/2019 03/30/2017  WBC 3.8 - 10.8 Thousand/uL 6.5 5.9 12.8(H)  Hemoglobin 13.2 - 17.1 g/dL 14.0 14.5 13.4  Hematocrit 38.5 - 50.0 % 41.0 43.3 38.9(L)  Platelets 140 - 400 Thousand/uL 258 272 246    Lab Results  Component Value Date/Time   VD25OH 22 (L) 07/16/2020 04:01 PM    VD25OH 13 (L) 03/21/2020 11:04 AM    Clinical ASCVD: No  The 10-year ASCVD risk score Mikey Bussing DC Jr., et al., 2013) is: 19.7%   Values used to calculate the score:     Age: 25 years     Sex: Male     Is Non-Hispanic African American: No     Diabetic: Yes     Tobacco smoker: No     Systolic Blood Pressure: 800 mmHg     Is BP treated: Yes     HDL Cholesterol: 46 mg/dL     Total Cholesterol: 208 mg/dL    Depression screen [facility] 2/9 04/17/2021 03/20/2021 02/25/2021  Decreased Interest 0 0 0  Down, Depressed, Hopeless 0 0 0  PHQ - 2 Score 0 0 0  Altered sleeping 0 0 -  Tired, decreased energy 0 0 -  Change in appetite 0 0 -  Feeling bad or failure about yourself  0 0 -  Trouble concentrating 0 0 -  Moving slowly or fidgety/restless 0 0 -  Suicidal thoughts 0 0 -  PHQ-9 Score 0 0 -  Difficult doing work/chores Not difficult at all Not difficult at all -  Some recent data might be hidden     Social History   Tobacco Use  Smoking Status Never  Smokeless Tobacco Never  Tobacco Comments   Non-smoker   BP Readings from Last 3 Encounters:  04/17/21 132/84  03/20/21 140/80  02/25/21 138/80   Pulse Readings from Last 3 Encounters:  04/17/21 80  03/20/21 98  02/25/21 77   Wt Readings from Last 3 Encounters:  04/17/21 198 lb 1.6 oz (89.9 kg)  03/20/21 199 lb 12.8 oz (90.6 kg)  02/25/21 197 lb 1.6 oz (89.4 kg)   BMI Readings from Last 3 Encounters:  04/17/21 34.00 kg/m  03/20/21 34.30 kg/m  02/25/21 33.83 kg/m    Assessment/Interventions: Review of patient past medical history, allergies, medications, health status, including review of consultants reports, laboratory and other test data, was performed as part of comprehensive evaluation and provision of chronic care management services.   SDOH:  (Social Determinants of Health) assessments and interventions performed: Yes  SDOH Screenings   Alcohol Screen: Low Risk    Last Alcohol Screening Score (AUDIT): 0  Depression  (PHQ2-9): Low Risk    PHQ-2 Score: 0  Financial Resource Strain: Low Risk    Difficulty of Paying Living Expenses: Not hard at all  Food Insecurity: No Food Insecurity   Worried About Charity fundraiser in the Last Year: Never true   Ran Out of Food in the Last Year: Never true  Housing: Low Risk    Last Housing Risk Score: 0  Physical Activity: Sufficiently Active   Days of Exercise per Week: 7 days   Minutes of Exercise per Session: 30 min  Social Connections: Moderately Isolated  Frequency of Communication with Friends and Family: More than three times a week   Frequency of Social Gatherings with Friends and Family: More than three times a week   Attends Religious Services: More than 4 times per year   Active Member of [employer] or Organizations: No   Attends Archivist Meetings: Never   Marital Status: Widowed  Stress: No Stress Concern Present   Feeling of Stress : Only a little  Tobacco Use: Low Risk    Smoking Tobacco Use: Never   Smokeless Tobacco Use: Never  Transportation Needs: No Transportation Needs   Lack of Transportation (Medical): No   Lack of Transportation (Non-Medical): No    CCM Care Plan  No Known Allergies  Medications Reviewed Today     Reviewed by Kathrine Haddock, NP (Family Nurse Practitioner) on 04/17/21 at 1348  Med List Status: <None>   Medication Order Taking? Sig Documenting Provider Last Dose Status Informant  acetaminophen (TYLENOL) 500 MG tablet [id number] Yes Take 1,000 mg by mouth every 6 (six) hours as needed (pain). [provider] Taking Active Pharmacy Records  amLODipine (NORVASC) 5 MG tablet [id number] Yes Take 1 tablet (5 mg total) by mouth daily. [name], PA-C Taking Active   Armodafinil 250 MG tablet [id number] Yes Take by mouth in the morning.  [provider] Taking Active   b complex vitamins capsule [id number] Yes Take 1 capsule by mouth daily. Brunetta Genera, MD Taking Active Pharmacy Records   carbidopa-levodopa (SINEMET CR) 50-200 MG per tablet [id number] Yes Take 1 tablet by mouth 3 (three) times daily.  [provider] Taking Active Pharmacy Records           Med Note Quinn Axe Mar 15, 2016  4:06 PM)    cholecalciferol (VITAMIN D3) 25 MCG (1000 UT) tablet [id number] Yes Take 1,000 Units by mouth daily. [provider] Taking Active   DULoxetine (CYMBALTA) 60 MG capsule [id number] Yes Take 120 mg by mouth at bedtime. [name], NP Taking Active Pharmacy Records           Med Note Quinn Axe Mar 15, 2016  4:03 PM)    glucose blood [facility] VERIO) test strip [id number] Yes DX:E11.69, LON:99 Check TID [name], PA-C Taking Active   glucose blood test strip [id number] Yes Check once daily fasting, and up to 3 times a day as needed. [name], PA-C Taking Active   insulin degludec (TRESIBA) 100 UNIT/ML FlexTouch Pen [id number] Yes Inject 50-80 Units into the skin daily. Per MD/PA instruction [name], PA-C Taking Active   Insulin Glargine (BASAGLAR KWIKPEN) 100 UNIT/ML [id number] Yes Inject 50 Units into the skin daily. Kathrine Haddock, NP  Active   Insulin Pen Needle (NOVOFINE) 32G X 6 MM MISC [id number] Yes 1 each by Does not apply route daily. [name], PA-C Taking Active   lamoTRIgine (LAMICTAL) 200 MG tablet [id number] Yes Take 400 mg by mouth at bedtime.  [name], NP Taking Active Pharmacy Records           Med Note Quinn Axe Mar 15, 2016  4:04 PM)    LORazepam (ATIVAN) 1 MG tablet [id number] Yes Typically takes  1 to 2 times daily [provider] Taking Active   metFORMIN (GLUCOPHAGE) 1000 MG tablet [id number] Yes Take 1 tablet (1,000 mg total) by mouth 2 (two) times daily with a meal. Kathrine Haddock, NP  Active   pregabalin (LYRICA)  50 MG capsule [id number] Yes TAKE ONE CAPSULE BY MOUTH TWICE DAILY [name], PA-C Taking Active   propranolol ER (INDERAL LA) 60 MG 24 hr capsule [id number] Yes Take  60 mg by mouth at bedtime.  [provider] Taking Active Pharmacy Records           Med Note Quinn Axe Mar 15, 2016  4:05 PM)    QUEtiapine (SEROQUEL) 300 MG tablet [id number] Yes Take 300 mg by mouth at bedtime. [provider] Taking Active   quinapril (ACCUPRIL) 20 MG tablet [id number] Yes Take 1 tablet (20 mg total) by mouth at bedtime. [name], PA-C Taking Active   rOPINIRole (REQUIP) 2 MG tablet [id number] Yes Take 2 mg by mouth 2 (two) times daily. [provider] Taking Active   rosuvastatin (CRESTOR) 40 MG tablet [id number] Yes Take 1 tablet (40 mg total) by mouth at bedtime. [name], PA-C Taking Active             Patient Active Problem List   Diagnosis Date Noted   Insulin dependent type 2 diabetes mellitus, uncontrolled ([place]) 03/20/2021   Pure hypercholesterolemia 02/16/2019   Grief 02/16/2019   Major depression, recurrent ([place]) 03/14/2018   Bipolar 1 disorder, manic, moderate ([place])    Vitamin D deficiency 09/10/2017   Gastroesophageal reflux disease without esophagitis 09/10/2017   History of iron deficiency anemia 03/30/2016   History of iron deficiency anemia 03/24/2016   History of GI bleed 03/15/2016   Hyponatremia 03/15/2016   Parkinson disease ([place])    Essential hypertension    Anxiety 08/19/2015   Abnormal ECG 04/09/2015   Back ache 04/09/2015   Diabetes mellitus type 2 in obese ([place]) 04/09/2015   Obstructive sleep apnea 04/09/2015   HDL deficiency 04/09/2015   Hearing loss, sensorineural, combined types 04/09/2015   HLD (hyperlipidemia) 04/09/2015   Vocal cord atrophy 05/29/2014   Fibromyalgia 03/15/2014   Testicular hypofunction 05/17/2009   Migraine with aura 05/14/2009    Immunization History  Administered Date(s) Administered   Influenza, Seasonal, Injecte, Preservative Fre 06/19/2010, 07/07/2012   Influenza,inj,Quad PF,6+ Mos 08/02/2013, 07/10/2014, 11/02/2016, 07/06/2017, 07/14/2018, 10/12/2019,  07/16/2020   PFIZER(Purple Top)SARS-COV-2 Vaccination 04/19/2020, 05/10/2020   Pneumococcal Polysaccharide-23 07/07/2012, 09/08/2017   Tdap 04/23/2011    Conditions to be addressed/monitored:  Hypertension, Hyperlipidemia, Diabetes, GERD, Anxiety, and Bipolar 1 disorder  There are no care plans that you recently modified to display for this patient.    Medication Assistance: {MEDASSISTANCEINFO:25044}  Compliance/Adherence/Medication fill history: Care Gaps: Pneumococcal Vaccine (Last 09/18/2017) COLONOSCOPY (last completed 06/01/2016) COVID-19 Vaccine Dose 3 FOOT EXAM (last completed 03/21/2020) TETANUS/TDAP (last completed 04/23/2011) OPHTHALMOLOGY EXAM  [facility]: Semaglutide 3 mg no fills noted Metformin 1000 mg last filled on 07/01/2018 for a 30 day supply with [facility] Quinapril 20 mg last filled on 06/14/2020 for a 20 day supply with [facility] Rosuvastatin 40 mg last filled on 06/14/2020 for a 20 day supply with [facility]  Patient's preferred pharmacy is:  [id number] [address] [place] [address] Phone: [phone] Fax: [phone]  [facility] - [place], [place] - [address] [place] [address] Phone: [phone] Fax: [phone]  Uses pill box? {Yes or If no, why not?:20788} Pt endorses ***% compliance  We discussed: {Pharmacy options:24294} Patient decided to: {[facility] Pharmacy [facility]  Care  Plan and Follow Up Patient Decision:  {FOLLOWUP:24991}  Plan: {CM FOLLOW UP ZOXW:96045}  ***  Current Barriers:  {pharmacybarriers:24917}  Pharmacist Clinical Goal(s):  Patient will {PHARMACYGOALCHOICES:24921} through collaboration with PharmD and provider.   Interventions: 1:1 collaboration with [name], PA-C regarding development and update of comprehensive  plan of care as evidenced by provider attestation and co-signature Inter-disciplinary care team collaboration (see longitudinal plan of care) Comprehensive medication review performed; medication list updated in electronic medical record  Hypertension  (Status:{CMCPGOALSTATUS:25802})   Med Management Intervention: {PHARMMEDMGMNT:25878}  (BP goal {CHL [facility] Pharmacist BP ranges:[phone]}) -{US controlled/uncontrolled:25276} -Current treatment: Amlodipine 5 mg daily  Propanolol Er 60 mg nightly  Quinapril 20 mg daily  -Medications previously tried: ***  -Current home readings: *** -Current dietary habits: *** -Current exercise habits: *** -{ACTIONS;DENIES/REPORTS:21021675} hypotensive/hypertensive symptoms -Educated on {CCM BP Counseling:25124} -Counseled to monitor BP at home ***, document, and provide log at future appointments -{CCMPHARMDINTERVENTION:25122}  Hyperlipidemia: (LDL goal < ***) -{US controlled/uncontrolled:25276} -Current treatment: Rosuvastatin 40 mg daily  -Medications previously tried: ***  -Current dietary patterns: *** -Current exercise habits: *** -Educated on {CCM HLD Counseling:25126} -{CCMPHARMDINTERVENTION:25122}  Diabetes (A1c goal {A1c goals:23924}) -{US controlled/uncontrolled:25276} -Current medications: Tresiba 50 units daily  Basaglar 50 units daily  Metformin 1000 mg twice daily  Rybelsus 3 mg daily  -Medications previously tried: ***  -Current home glucose readings fasting glucose: *** post prandial glucose: *** -{ACTIONS;DENIES/REPORTS:21021675} hypoglycemic/hyperglycemic symptoms -Current meal patterns:  breakfast: ***  lunch: ***  dinner: *** snacks: *** drinks: *** -Current exercise: *** -Educated on {CCM DM COUNSELING:25123} -Counseled to check feet daily and get yearly eye exams -{CCMPHARMDINTERVENTION:25122}  Depression/Anxiety (Goal: ***) -{US controlled/uncontrolled:25276} -Current treatment: Armodafainil 250  mg daily  Duloxetine 60 mg 2 capsules nightly  Lamotrigine 200 mg 2 tablets nightly  Lorazepam 1 mg twice daily  Quetiapine 300 mg nightly  -Medications previously tried/failed: *** -PHQ9: *** -GAD7: *** -Connected with *** for mental health support -Educated on {CCM mental health counseling:25127} -{CCMPHARMDINTERVENTION:25122}  Patient Goals/Self-Care Activities Patient will:  - {pharmacypatientgoals:24919}  Follow Up Plan: {CM FOLLOW UP WUJW:11914}

## 2021-05-15 ENCOUNTER — Other Ambulatory Visit: Payer: Self-pay

## 2021-05-15 ENCOUNTER — Encounter: Payer: Self-pay | Admitting: Family Medicine

## 2021-05-15 ENCOUNTER — Ambulatory Visit (INDEPENDENT_AMBULATORY_CARE_PROVIDER_SITE_OTHER): Payer: PPO | Admitting: Family Medicine

## 2021-05-15 VITALS — BP 130/78 | HR 86 | Temp 98.4°F | Resp 16 | Ht 64.0 in | Wt 199.9 lb

## 2021-05-15 DIAGNOSIS — Z6834 Body mass index (BMI) 34.0-34.9, adult: Secondary | ICD-10-CM

## 2021-05-15 DIAGNOSIS — Z5181 Encounter for therapeutic drug level monitoring: Secondary | ICD-10-CM | POA: Diagnosis not present

## 2021-05-15 DIAGNOSIS — T679XXA Effect of heat and light, unspecified, initial encounter: Secondary | ICD-10-CM | POA: Diagnosis not present

## 2021-05-15 DIAGNOSIS — M791 Myalgia, unspecified site: Secondary | ICD-10-CM | POA: Diagnosis not present

## 2021-05-15 DIAGNOSIS — G20 Parkinson's disease: Secondary | ICD-10-CM

## 2021-05-15 DIAGNOSIS — E7800 Pure hypercholesterolemia, unspecified: Secondary | ICD-10-CM

## 2021-05-15 DIAGNOSIS — Z23 Encounter for immunization: Secondary | ICD-10-CM

## 2021-05-15 DIAGNOSIS — Z794 Long term (current) use of insulin: Secondary | ICD-10-CM

## 2021-05-15 DIAGNOSIS — E669 Obesity, unspecified: Secondary | ICD-10-CM

## 2021-05-15 DIAGNOSIS — M62838 Other muscle spasm: Secondary | ICD-10-CM | POA: Diagnosis not present

## 2021-05-15 DIAGNOSIS — I10 Essential (primary) hypertension: Secondary | ICD-10-CM | POA: Diagnosis not present

## 2021-05-15 DIAGNOSIS — F3112 Bipolar disorder, current episode manic without psychotic features, moderate: Secondary | ICD-10-CM | POA: Diagnosis not present

## 2021-05-15 DIAGNOSIS — M797 Fibromyalgia: Secondary | ICD-10-CM

## 2021-05-15 DIAGNOSIS — IMO0002 Reserved for concepts with insufficient information to code with codable children: Secondary | ICD-10-CM

## 2021-05-15 DIAGNOSIS — E1165 Type 2 diabetes mellitus with hyperglycemia: Secondary | ICD-10-CM | POA: Diagnosis not present

## 2021-05-15 DIAGNOSIS — G20A1 Parkinson's disease without dyskinesia, without mention of fluctuations: Secondary | ICD-10-CM

## 2021-05-15 NOTE — Progress Notes (Signed)
Name: Bryan Flowers   MRN: [id number]    DOB: 1961-04-26   Date:05/15/2021       Progress Note  Chief Complaint  Patient presents with   Hyperlipidemia   Diabetes   Gastroesophageal Reflux   Hypertension     Subjective:   Bryan Flowers is a 60 y.o. male, presents to clinic for   DM:   Currently only on rybelsus and metformin, on insulin but not currently taking?  He has f/up with endo  Has been prescribed multiple different kinds of insulin and given samples in the past couple months  Denies: Polyuria, polydipsia, vision changes, neuropathy, hypoglycemia Recent pertinent labs: Lab Results  Component Value Date   HGBA1C 11.3 (H) 03/20/2021   HGBA1C 8.2 (H) 07/16/2020   HGBA1C 12.0 (H) 03/21/2020   Lab Results  Component Value Date   MICROALBUR 1.2 09/12/2019   LDLCALC 138 (H) 03/20/2021   CREATININE 0.84 03/20/2021   Standard of care and health maintenance: Foot exam:  due - done today DM eye exam:  done Feb or March this year - [facility] -  ACEI/ARB:  quinapril Statin:  crestor  Hyperlipidemia: Currently treated with crestor 40 , pt reports good med compliance Last Lipids: Lab Results  Component Value Date   CHOL 208 (H) 03/20/2021   HDL 46 03/20/2021   LDLCALC 138 (H) 03/20/2021   TRIG 122 03/20/2021   CHOLHDL 4.5 03/20/2021   - Denies: Chest pain, shortness of breath, myalgias, claudication       Current Outpatient Medications:    acetaminophen (TYLENOL) 500 MG tablet, Take 1,000 mg by mouth every 6 (six) hours as needed (pain)., Disp: , Rfl:    amLODipine (NORVASC) 5 MG tablet, Take 1 tablet (5 mg total) by mouth daily., Disp: 90 tablet, Rfl: 3   Armodafinil 250 MG tablet, Take by mouth in the morning. , Disp: , Rfl:    b complex vitamins capsule, Take 1 capsule by mouth daily., Disp: , Rfl:    carbidopa-levodopa (SINEMET CR) 50-200 MG per tablet, Take 1 tablet by mouth 3 (three) times daily. , Disp: , Rfl:    cholecalciferol  (VITAMIN D3) 25 MCG (1000 UT) tablet, Take 1,000 Units by mouth daily., Disp: , Rfl:    glucose blood (ONETOUCH VERIO) test strip, DX:E11.69, LON:99 Check TID, Disp: 100 each, Rfl: 12   glucose blood test strip, Check once daily fasting, and up to 3 times a day as needed., Disp: 100 each, Rfl: 12   Insulin Pen Needle (NOVOFINE) 32G X 6 MM MISC, 1 each by Does not apply route daily., Disp: 100 each, Rfl: 3   lamoTRIgine (LAMICTAL) 200 MG tablet, Take 400 mg by mouth at bedtime. , Disp: , Rfl:    LORazepam (ATIVAN) 1 MG tablet, Typically takes  1 to 2 times daily, Disp: , Rfl:    metFORMIN (GLUCOPHAGE) 1000 MG tablet, Take 1 tablet (1,000 mg total) by mouth 2 (two) times daily with a meal., Disp: 180 tablet, Rfl: 3   pregabalin (LYRICA) 50 MG capsule, Take 1 capsule (50 mg total) by mouth 2 (two) times daily., Disp: 60 capsule, Rfl: 12   propranolol ER (INDERAL LA) 60 MG 24 hr capsule, Take 60 mg by mouth at bedtime. , Disp: , Rfl:    QUEtiapine (SEROQUEL) 300 MG tablet, Take 300 mg by mouth at bedtime., Disp: , Rfl:    quinapril (ACCUPRIL) 20 MG tablet, Take 1 tablet (20 mg total) by  mouth at bedtime., Disp: 90 tablet, Rfl: 1   rOPINIRole (REQUIP) 2 MG tablet, Take 2 mg by mouth 2 (two) times daily., Disp: , Rfl:    rosuvastatin (CRESTOR) 40 MG tablet, Take 1 tablet (40 mg total) by mouth at bedtime., Disp: 90 tablet, Rfl: 1   Semaglutide (RYBELSUS) 3 MG TABS, Take 3 mg by mouth daily., Disp: 30 tablet, Rfl: 0   DULoxetine (CYMBALTA) 60 MG capsule, Take 120 mg by mouth at bedtime. (Patient not taking: Reported on 05/15/2021), Disp: , Rfl:    insulin degludec (TRESIBA) 100 UNIT/ML FlexTouch Pen, Inject 50-80 Units into the skin daily. Per MD/PA instruction (Patient not taking: Reported on 05/15/2021), Disp: 15 mL, Rfl: 2   Insulin Glargine (BASAGLAR KWIKPEN) 100 UNIT/ML, Inject 50 Units into the skin daily. (Patient not taking: Reported on 05/15/2021), Disp: 3 mL, Rfl: 12  Patient Active Problem List    Diagnosis Date Noted   Insulin dependent type 2 diabetes mellitus, uncontrolled ([place]) 03/20/2021   Pure hypercholesterolemia 02/16/2019   Grief 02/16/2019   Major depression, recurrent ([place]) 03/14/2018   Bipolar 1 disorder, manic, moderate ([place])    Vitamin D deficiency 09/10/2017   Gastroesophageal reflux disease without esophagitis 09/10/2017   History of iron deficiency anemia 03/30/2016   History of iron deficiency anemia 03/24/2016   History of GI bleed 03/15/2016   Hyponatremia 03/15/2016   Parkinson disease ([place])    Essential hypertension    Anxiety 08/19/2015   Abnormal ECG 04/09/2015   Back ache 04/09/2015   Diabetes mellitus type 2 in obese ([place]) 04/09/2015   Obstructive sleep apnea 04/09/2015   HDL deficiency 04/09/2015   Hearing loss, sensorineural, combined types 04/09/2015   HLD (hyperlipidemia) 04/09/2015   Vocal cord atrophy 05/29/2014   Fibromyalgia 03/15/2014   Testicular hypofunction 05/17/2009   Migraine with aura 05/14/2009    Past Surgical History:  Procedure Laterality Date   ESOPHAGOGASTRODUODENOSCOPY Left 03/16/2016   Procedure: ESOPHAGOGASTRODUODENOSCOPY (EGD);  Surgeon: Manus Gunning, MD;  Location: [facility];  Service: Gastroenterology;  Laterality: Left;   FOOT FRACTURE SURGERY Right     Family History  Problem Relation Age of Onset   Cancer Mother    Heart disease Father    Arthritis Brother     Social History   Tobacco Use   Smoking status: Never   Smokeless tobacco: Never   Tobacco comments:    Non-smoker  Vaping Use   Vaping Use: Never used  Substance Use Topics   Alcohol use: No    Alcohol/week: 0.0 standard drinks   Drug use: No     No Known Allergies  Health Maintenance  Topic Date Due   OPHTHALMOLOGY EXAM  05/06/2018   Pneumococcal Vaccine 22-58 Years old (3 - PCV) 09/08/2018   COLONOSCOPY (Pts 45-62yr Insurance coverage will need to be confirmed)  06/02/2019   FOOT EXAM  03/21/2021   COVID-19 Vaccine (3 -  Pfizer risk series) 05/31/2021 (Originally 06/07/2020)   Zoster Vaccines- Shingrix (1 of 2) 06/20/2021 (Originally 08/10/1980)   INFLUENZA VACCINE  07/01/2021 (Originally 05/05/2021)   TETANUS/TDAP  05/15/2022 (Originally 04/22/2021)   HEMOGLOBIN A1C  09/19/2021   PNEUMOCOCCAL POLYSACCHARIDE VACCINE AGE 75-64 HIGH RISK  Completed   Hepatitis C Screening  Completed   HIV Screening  Completed   HPV VACCINES  Aged Out    Chart Review Today: I personally reviewed active problem list, medication list, allergies, family history, social history, health maintenance, notes from last encounter, lab results, imaging with the  patient/caregiver today.   Review of Systems  Constitutional: Negative.   HENT: Negative.    Eyes: Negative.   Respiratory: Negative.    Cardiovascular: Negative.   Gastrointestinal: Negative.   Endocrine: Negative.   Genitourinary: Negative.   Musculoskeletal: Negative.   Skin: Negative.   Allergic/Immunologic: Negative.   Neurological: Negative.   Hematological: Negative.   Psychiatric/Behavioral: Negative.    All other systems reviewed and are negative.   Objective:   Vitals:   05/15/21 1334  BP: 130/78  Pulse: 86  Resp: 16  Temp: 98.4 F (36.9 C)  SpO2: 98%  Weight: 199 lb 14.4 oz (90.7 kg)  Height: '5\' 4"'$  (1.626 m)    Body mass index is 34.31 kg/m.  Physical Exam Vitals and nursing note reviewed.  Constitutional:      General: He is not in acute distress.    Appearance: Normal appearance. He is obese. He is not ill-appearing, toxic-appearing or diaphoretic.  HENT:     Head: Normocephalic and atraumatic.  Eyes:     General: No scleral icterus.       Right eye: No discharge.        Left eye: No discharge.     Conjunctiva/sclera: Conjunctivae normal.  Cardiovascular:     Rate and Rhythm: Normal rate and regular rhythm.     Pulses: Normal pulses.     Heart sounds: Normal heart sounds.  Pulmonary:     Effort: Pulmonary effort is normal.     Breath  sounds: Normal breath sounds.  Abdominal:     General: Bowel sounds are normal.     Palpations: Abdomen is soft.  Musculoskeletal:     Right lower leg: No edema.     Left lower leg: No edema.  Skin:    Capillary Refill: Capillary refill takes less than 2 seconds.     Coloration: Skin is pale. Skin is not jaundiced.     Findings: No lesion or rash.  Neurological:     Mental Status: He is alert. Mental status is at baseline.  Psychiatric:        Mood and Affect: Mood normal.    Diabetic Foot Exam - Simple   Simple Foot Form Diabetic Foot exam was performed with the following findings: Yes 05/15/2021  1:55 PM  Visual Inspection No deformities, no ulcerations, no other skin breakdown bilaterally: Yes Sensation Testing Intact to touch and monofilament testing bilaterally: Yes Pulse Check Posterior Tibialis and Dorsalis pulse intact bilaterally: Yes Comments        Assessment & Plan:   1. Insulin dependent type 2 diabetes mellitus, uncontrolled ([place]) Uncontrolled - pending A1C will need to restart insulin and f/up with endo Sample given of basal insulin - start 20 units daily and titrate - COMPLETE METABOLIC PANEL WITH GFR - Hemoglobin A1c  2. Fibromyalgia Fairly well controlled sx  3. Pure hypercholesterolemia Good statin compliance per pt? Hx of poor compliance due to cost, will recheck labs and adjust meds as needed, but he reports taking and tolerating statin - COMPLETE METABOLIC PANEL WITH GFR - Lipid panel  4. Parkinson disease [facility]) Managed by specialists in [place]  5. Essential hypertension Stable, well controlled, BP at goal today - COMPLETE METABOLIC PANEL WITH GFR  6. Bipolar 1 disorder, manic, moderate (HCC) Mood good, managed by specialists  7. Class 1 obesity with serious comorbidity and body mass index (BMI) of 34.0 to 34.9 in adult, unspecified obesity type Weight stable  8. Need for pneumococcal vaccination  -  Pneumococcal conjugate vaccine  20-valent (Prevnar 20)  9. Uncontrolled type 2 diabetes mellitus with hyperglycemia ([place]) Foot exam done - see above  - COMPLETE METABOLIC PANEL WITH GFR - Hemoglobin A1c  10. Encounter for medication monitoring  - CBC with Differential/Platelet - COMPLETE METABOLIC PANEL WITH GFR - Hemoglobin A1c - Lipid panel    Return in about 4 months (around 09/14/2021).   [name], PA-C 05/15/21 2:09 PM

## 2021-05-16 ENCOUNTER — Other Ambulatory Visit: Payer: Self-pay | Admitting: Family Medicine

## 2021-05-16 LAB — CBC WITH DIFFERENTIAL/PLATELET
Absolute Monocytes: 548 cells/uL (ref 200–950)
Basophils Absolute: 40 cells/uL (ref 0–200)
Basophils Relative: 0.6 %
Eosinophils Absolute: 73 cells/uL (ref 15–500)
Eosinophils Relative: 1.1 %
HCT: 41.8 % (ref 38.5–50.0)
Hemoglobin: 13.7 g/dL (ref 13.2–17.1)
Lymphs Abs: 1168 cells/uL (ref 850–3900)
MCH: 29.7 pg (ref 27.0–33.0)
MCHC: 32.8 g/dL (ref 32.0–36.0)
MCV: 90.7 fL (ref 80.0–100.0)
MPV: 10.4 fL (ref 7.5–12.5)
Monocytes Relative: 8.3 %
Neutro Abs: 4772 cells/uL (ref 1500–7800)
Neutrophils Relative %: 72.3 %
Platelets: 230 10*3/uL (ref 140–400)
RBC: 4.61 10*6/uL (ref 4.20–5.80)
RDW: 12.4 % (ref 11.0–15.0)
Total Lymphocyte: 17.7 %
WBC: 6.6 10*3/uL (ref 3.8–10.8)

## 2021-05-16 LAB — HEMOGLOBIN A1C
Hgb A1c MFr Bld: 10.4 % of total Hgb — ABNORMAL HIGH (ref <5.7)
Mean Plasma Glucose: 252 mg/dL
eAG (mmol/L): 13.9 mmol/L

## 2021-05-16 LAB — LIPID PANEL
Cholesterol: 207 mg/dL — ABNORMAL HIGH (ref <200)
HDL: 51 mg/dL (ref >=40)
LDL Cholesterol (Calc): 129 mg/dL (calc) — ABNORMAL HIGH
Non-HDL Cholesterol (Calc): 156 mg/dL (calc) — ABNORMAL HIGH (ref <130)
Total CHOL/HDL Ratio: 4.1 (calc) (ref <5.0)
Triglycerides: 156 mg/dL — ABNORMAL HIGH (ref <150)

## 2021-05-16 LAB — COMPLETE METABOLIC PANEL WITH GFR
AG Ratio: 2.1 (calc) (ref 1.0–2.5)
ALT: 15 U/L (ref 9–46)
AST: 12 U/L (ref 10–35)
Albumin: 4.4 g/dL (ref 3.6–5.1)
Alkaline phosphatase (APISO): 58 U/L (ref 35–144)
BUN: 14 mg/dL (ref 7–25)
CO2: 28 mmol/L (ref 20–32)
Calcium: 9.5 mg/dL (ref 8.6–10.3)
Chloride: 99 mmol/L (ref 98–110)
Creat: 0.89 mg/dL (ref 0.70–1.30)
Globulin: 2.1 g/dL (calc) (ref 1.9–3.7)
Glucose, Bld: 314 mg/dL — ABNORMAL HIGH (ref 65–99)
Potassium: 4.4 mmol/L (ref 3.5–5.3)
Sodium: 135 mmol/L (ref 135–146)
Total Bilirubin: 0.4 mg/dL (ref 0.2–1.2)
Total Protein: 6.5 g/dL (ref 6.1–8.1)
eGFR: 99 mL/min/{1.73_m2} (ref >=60)

## 2021-05-16 LAB — CK: Total CK: 48 U/L (ref 44–196)

## 2021-05-16 MED ORDER — RYBELSUS 7 MG PO TABS: 7.0000 mg | ORAL_TABLET | Freq: Every day | ORAL | 1 refills | Status: DC

## 2021-05-29 ENCOUNTER — Ambulatory Visit (AMBULATORY_SURGERY_CENTER): Payer: PPO

## 2021-05-29 ENCOUNTER — Other Ambulatory Visit: Payer: Self-pay

## 2021-05-29 VITALS — Ht 71.0 in | Wt 199.0 lb

## 2021-05-29 DIAGNOSIS — Z86010 Personal history of colonic polyps: Secondary | ICD-10-CM

## 2021-05-29 MED ORDER — NA SULFATE-K SULFATE-MG SULF 17.5-3.13-1.6 GM/177ML PO SOLN: 1.0000 | Freq: Once | ORAL | 0 refills | Status: AC

## 2021-05-29 NOTE — Progress Notes (Signed)
Patient's pre-visit was done today over the phone with the patient Name,DOB and address verified.  Patient denies any allergies to Eggs and Soy. Patient denies any problems with anesthesia/sedation. Patient denies taking diet pills or blood thinners. No home Oxygen. Packet of Prep instructions mailed to patient including a copy of a consent form-pt is aware. Patient understands to call us back with any questions or concerns. Patient is aware of our care-partner policy and 0000000 safety protocol.   EMMI education assigned to the patient for the procedure, sent to [place].   The patient is COVID-19 vaccinated.    Ht corrected per pt who says 5-11 vs 5-4

## 2021-06-12 ENCOUNTER — Ambulatory Visit (AMBULATORY_SURGERY_CENTER): Payer: PPO | Admitting: Gastroenterology

## 2021-06-12 ENCOUNTER — Encounter: Payer: Self-pay | Admitting: Gastroenterology

## 2021-06-12 ENCOUNTER — Other Ambulatory Visit: Payer: Self-pay

## 2021-06-12 VITALS — BP 156/87 | HR 79 | Temp 97.1°F | Resp 16 | Ht 64.0 in | Wt 199.0 lb

## 2021-06-12 DIAGNOSIS — G4733 Obstructive sleep apnea (adult) (pediatric): Secondary | ICD-10-CM | POA: Diagnosis not present

## 2021-06-12 DIAGNOSIS — Z86010 Personal history of colonic polyps: Secondary | ICD-10-CM

## 2021-06-12 DIAGNOSIS — D120 Benign neoplasm of cecum: Secondary | ICD-10-CM

## 2021-06-12 DIAGNOSIS — E119 Type 2 diabetes mellitus without complications: Secondary | ICD-10-CM | POA: Diagnosis not present

## 2021-06-12 DIAGNOSIS — G20 Parkinson's disease: Secondary | ICD-10-CM | POA: Diagnosis not present

## 2021-06-12 DIAGNOSIS — I10 Essential (primary) hypertension: Secondary | ICD-10-CM | POA: Diagnosis not present

## 2021-06-12 DIAGNOSIS — G2581 Restless legs syndrome: Secondary | ICD-10-CM | POA: Diagnosis not present

## 2021-06-12 DIAGNOSIS — D122 Benign neoplasm of ascending colon: Secondary | ICD-10-CM

## 2021-06-12 DIAGNOSIS — F319 Bipolar disorder, unspecified: Secondary | ICD-10-CM | POA: Diagnosis not present

## 2021-06-12 DIAGNOSIS — D125 Benign neoplasm of sigmoid colon: Secondary | ICD-10-CM

## 2021-06-12 MED ORDER — SODIUM CHLORIDE 0.9 % IV SOLN: 500.0000 mL | Freq: Once | INTRAVENOUS | Status: DC

## 2021-06-12 NOTE — Op Note (Signed)
[facility] Patient Name: Bryan Flowers Procedure Date: 06/12/2021 3:01 PM MRN: [id number] Endoscopist: Remo Lipps P. Havery Moros , MD Age: 60 Referring MD:  Date of Birth: 03/16/1961 Gender: Male Account #: [id number] Procedure:                Colonoscopy Indications:              High risk colon cancer surveillance: Personal                            history of colonic polyps - 6 adenomas 05/2016 Medicines:                Monitored Anesthesia Care Procedure:                Pre-Anesthesia Assessment:                           - Prior to the procedure, a History and Physical                            was performed, and patient medications and                            allergies were reviewed. The patient's tolerance of                            previous anesthesia was also reviewed. The risks                            and benefits of the procedure and the sedation                            options and risks were discussed with the patient.                            All questions were answered, and informed consent                            was obtained. Prior Anticoagulants: The patient has                            taken no previous anticoagulant or antiplatelet                            agents. ASA Grade Assessment: III - A patient with                            severe systemic disease. After reviewing the risks                            and benefits, the patient was deemed in                            satisfactory condition to undergo the procedure.  After obtaining informed consent, the colonoscope                            was passed under direct vision. Throughout the                            procedure, the patient's blood pressure, pulse, and                            oxygen saturations were monitored continuously. The                            CF [id number] was introduced through the anus                            and  advanced to the the cecum, identified by                            appendiceal orifice and ileocecal valve. The                            colonoscopy was performed without difficulty. The                            patient tolerated the procedure well. The quality                            of the bowel preparation was adequate. The                            ileocecal valve, appendiceal orifice, and rectum                            were photographed. Scope In: 3:30:11 PM Scope Out: 3:54:29 PM Scope Withdrawal Time: 0 hours 21 minutes 27 seconds  Total Procedure Duration: 0 hours 24 minutes 18 seconds  Findings:                 The perianal and digital rectal examinations were                            normal.                           A 3 mm polyp was found in the cecum. The polyp was                            sessile. The polyp was removed with a cold snare.                            Resection and retrieval were complete.                           A 3 mm polyp was found in the ascending colon. The  polyp was sessile. The polyp was removed with a                            cold snare. Resection and retrieval were complete.                           A 2 to 3 mm polyp was found in the sigmoid colon.                            The polyp was sessile. The polyp was removed with a                            cold snare. Resection and retrieval were complete.                           Multiple small-mouthed diverticula were found in                            the left colon.                           Internal hemorrhoids were found during retroflexion.                           The colon was quite spastic which prolonged the                            exam. The exam was otherwise without abnormality. Complications:            No immediate complications. Estimated blood loss:                            Minimal. Estimated Blood Loss:     Estimated blood loss was  minimal. Impression:               - One 3 mm polyp in the cecum, removed with a cold                            snare. Resected and retrieved.                           - One 3 mm polyp in the ascending colon, removed                            with a cold snare. Resected and retrieved.                           - One 2 to 3 mm polyp in the sigmoid colon, removed                            with a cold snare. Resected and retrieved.                           -  Diverticulosis in the left colon.                           - Spastic colon                           - Internal hemorrhoids.                           - The examination was otherwise normal. Recommendation:           - Patient has a contact number available for                            emergencies. The signs and symptoms of potential                            delayed complications were discussed with the                            patient. Return to normal activities tomorrow.                            Written discharge instructions were provided to the                            patient.                           - Resume previous diet.                           - Continue present medications.                           - Await pathology results. Remo Lipps P. Alenna Russell, MD 06/12/2021 4:04:03 PM This report has been signed electronically.

## 2021-06-12 NOTE — Progress Notes (Signed)
[facility] History and Physical   Primary Care Physician:  [name], PA-C   Reason for Procedure:   History of colon polyps  Plan:    colonoscopy     HPI: Bryan Flowers is a 60 y.o. male  here for colonoscopy surveillance - 6 adenomas removed 05/2016. Patient denies any bowel symptoms at this time. No family history of colon cancer known. Otherwise feels well without any cardiopulmonary symptoms.    Past Medical History:  Diagnosis Date   Anemia    iron deficiency   Anxiety    Bipolar 1 disorder, manic, moderate ([place])    Blood transfusion without reported diagnosis    due to GI bleeding-ulcers   Claudication ([place]) 07/16/2017   Depression    Diabetes mellitus without complication (HCC)    GERD (gastroesophageal reflux disease)    Hyperlipidemia    Hypertension    Insomnia    Parkinson disease (HCC)    Peptic ulcer disease with hemorrhage    Sleep apnea    on cpap    Past Surgical History:  Procedure Laterality Date   ESOPHAGOGASTRODUODENOSCOPY Left 03/16/2016   Procedure: ESOPHAGOGASTRODUODENOSCOPY (EGD);  Surgeon: Manus Gunning, MD;  Location: [place];  Service: Gastroenterology;  Laterality: Left;   FOOT FRACTURE SURGERY Right     Prior to Admission medications   Medication Sig Start Date End Date Taking? Authorizing Provider  acetaminophen (TYLENOL) 500 MG tablet Take 1,000 mg by mouth every 6 (six) hours as needed (pain).   Yes [provider]  amLODipine (NORVASC) 5 MG tablet Take 1 tablet (5 mg total) by mouth daily. 07/16/20  Yes [name], PA-C  Armodafinil 250 MG tablet Take by mouth in the morning.    Yes [provider]  b complex vitamins capsule Take 1 capsule by mouth daily. 03/30/16  Yes Brunetta Genera, MD  carbidopa-levodopa (SINEMET CR) 50-200 MG per tablet Take 1 tablet by mouth 3 (three) times daily.  03/08/15  Yes [provider]  cholecalciferol (VITAMIN D3) 25 MCG (1000 UT) tablet  Take 1,000 Units by mouth daily.   Yes [provider]  DULoxetine (CYMBALTA) 60 MG capsule Take 120 mg by mouth at bedtime. 03/15/15  Yes [name], NP  glucose blood (ONETOUCH VERIO) test strip DX:E11.69, LON:99 Check TID 06/20/20  Yes [name], PA-C  glucose blood test strip Check once daily fasting, and up to 3 times a day as needed. 06/18/20  Yes [name], PA-C  Insulin Pen Needle (NOVOFINE) 32G X 6 MM MISC 1 each by Does not apply route daily. 03/20/21  Yes [name], PA-C  lamoTRIgine (LAMICTAL) 200 MG tablet Take 400 mg by mouth at bedtime.  02/24/15  Yes [name], NP  LORazepam (ATIVAN) 1 MG tablet Typically takes  1 to 2 times daily 10/25/18  Yes [provider]  metFORMIN (GLUCOPHAGE) 1000 MG tablet Take 1 tablet (1,000 mg total) by mouth 2 (two) times daily with a meal. 04/17/21  Yes Kathrine Haddock, NP  pregabalin (LYRICA) 50 MG capsule Take 1 capsule (50 mg total) by mouth 2 (two) times daily. 04/17/21  Yes Kathrine Haddock, NP  propranolol ER (INDERAL LA) 60 MG 24 hr capsule Take 60 mg by mouth at bedtime.  02/24/15  Yes [provider]  QUEtiapine (SEROQUEL) 300 MG tablet Take 300 mg by mouth at bedtime. 02/05/21  Yes [provider]  quinapril (ACCUPRIL) 20 MG tablet Take 1 tablet (20 mg total) by mouth at  bedtime. 03/20/21  Yes [name], PA-C  rOPINIRole (REQUIP) 2 MG tablet Take 2 mg by mouth 2 (two) times daily. 12/16/20  Yes [provider]  rosuvastatin (CRESTOR) 40 MG tablet Take 1 tablet (40 mg total) by mouth at bedtime. 03/20/21  Yes [name], PA-C  Semaglutide (RYBELSUS) 7 MG TABS Take 7 mg by mouth daily. 05/16/21  Yes [name], PA-C    Current Outpatient Medications  Medication Sig Dispense Refill   acetaminophen (TYLENOL) 500 MG tablet Take 1,000 mg by mouth every 6 (six) hours as needed (pain).     amLODipine (NORVASC) 5 MG tablet Take 1 tablet (5 mg total) by mouth daily. 90 tablet 3   Armodafinil 250 MG  tablet Take by mouth in the morning.      b complex vitamins capsule Take 1 capsule by mouth daily.     carbidopa-levodopa (SINEMET CR) 50-200 MG per tablet Take 1 tablet by mouth 3 (three) times daily.      cholecalciferol (VITAMIN D3) 25 MCG (1000 UT) tablet Take 1,000 Units by mouth daily.     DULoxetine (CYMBALTA) 60 MG capsule Take 120 mg by mouth at bedtime.     glucose blood (ONETOUCH VERIO) test strip DX:E11.69, LON:99 Check TID 100 each 12   glucose blood test strip Check once daily fasting, and up to 3 times a day as needed. 100 each 12   Insulin Pen Needle (NOVOFINE) 32G X 6 MM MISC 1 each by Does not apply route daily. 100 each 3   lamoTRIgine (LAMICTAL) 200 MG tablet Take 400 mg by mouth at bedtime.      LORazepam (ATIVAN) 1 MG tablet Typically takes  1 to 2 times daily     metFORMIN (GLUCOPHAGE) 1000 MG tablet Take 1 tablet (1,000 mg total) by mouth 2 (two) times daily with a meal. 180 tablet 3   pregabalin (LYRICA) 50 MG capsule Take 1 capsule (50 mg total) by mouth 2 (two) times daily. 60 capsule 12   propranolol ER (INDERAL LA) 60 MG 24 hr capsule Take 60 mg by mouth at bedtime.      QUEtiapine (SEROQUEL) 300 MG tablet Take 300 mg by mouth at bedtime.     quinapril (ACCUPRIL) 20 MG tablet Take 1 tablet (20 mg total) by mouth at bedtime. 90 tablet 1   rOPINIRole (REQUIP) 2 MG tablet Take 2 mg by mouth 2 (two) times daily.     rosuvastatin (CRESTOR) 40 MG tablet Take 1 tablet (40 mg total) by mouth at bedtime. 90 tablet 1   Semaglutide (RYBELSUS) 7 MG TABS Take 7 mg by mouth daily. 30 tablet 1   Current Facility-Administered Medications  Medication Dose Route Frequency Provider Last Rate Last Admin   0.9 %  sodium chloride infusion  500 mL Intravenous Once Tylek Boney, Carlota Raspberry, MD        Allergies as of 06/12/2021   (No Known Allergies)    Family History  Problem Relation Age of Onset   Cancer Mother    Heart disease Father    Arthritis Brother    Colon polyps Neg Hx     Colon cancer Neg Hx    Esophageal cancer Neg Hx    Rectal cancer Neg Hx    Stomach cancer Neg Hx     Social History   Socioeconomic History   Marital status: Widowed    Spouse name: Not on file   Number of children: 0   Years of education: Not  on file   Highest education level: High school graduate  Occupational History    Employer: DISABLED  Tobacco Use   Smoking status: Never   Smokeless tobacco: Never   Tobacco comments:    Non-smoker  Vaping Use   Vaping Use: Never used  Substance and Sexual Activity   Alcohol use: No    Alcohol/week: 0.0 standard drinks   Drug use: No   Sexual activity: Not Currently    Partners: Female  Other Topics Concern   Not on file  Social History Narrative   Pt lives alone.    Social Determinants of Health   Financial Resource Strain: Low Risk    Difficulty of Paying Living Expenses: Not hard at all  Food Insecurity: No Food Insecurity   Worried About Charity fundraiser in the Last Year: Never true   [place] in the Last Year: Never true  Transportation Needs: No Transportation Needs   Lack of Transportation (Medical): No   Lack of Transportation (Non-Medical): No  Physical Activity: Sufficiently Active   Days of Exercise per Week: 7 days   Minutes of Exercise per Session: 30 min  Stress: No Stress Concern Present   Feeling of Stress : Only a little  Social Connections: Moderately Isolated   Frequency of Communication with Friends and Family: More than three times a week   Frequency of Social Gatherings with Friends and Family: More than three times a week   Attends Religious Services: More than 4 times per year   Active Member of [employer] or Organizations: No   Attends Archivist Meetings: Never   Marital Status: Widowed  Human resources officer Violence: Not At Risk   Fear of Current or Ex-Partner: No   Emotionally Abused: No   Physically Abused: No   Sexually Abused: No    Review of Systems: All other review  of systems negative except as mentioned in the HPI.  Physical Exam: Vital signs BP (!) 178/92   Pulse 82   Temp (!) 97.1 F (36.2 C) (Skin)   Resp 18   Ht '5\' 4"'$  (1.626 m)   Wt 199 lb (90.3 kg)   SpO2 94%   BMI 34.16 kg/m   General:   Alert,  Well-developed, well-nourished, pleasant and cooperative in NAD Lungs:  Clear throughout to auscultation.   Heart:  Regular rate and rhythm Abdomen:  Soft, nontender and nondistended.   Neuro/Psych:  Alert and cooperative. Normal mood and affect. A and O x 3  Jolly Mango, MD [facility]

## 2021-06-12 NOTE — Progress Notes (Signed)
Pt's states no medical or surgical changes since previsit or office visit. VS assessed by D.T 

## 2021-06-12 NOTE — Patient Instructions (Signed)
Please read handouts provided. Continue present medications. Await pathology results.   YOU HAD AN ENDOSCOPIC PROCEDURE TODAY AT THE [facility]:   Refer to the procedure report that was given to you for any specific questions about what was found during the examination.  If the procedure report does not answer your questions, please call your gastroenterologist to clarify.  If you requested that your care partner not be given the details of your procedure findings, then the procedure report has been included in a sealed envelope for you to review at your convenience later.  YOU SHOULD EXPECT: Some feelings of bloating in the abdomen. Passage of more gas than usual.  Walking can help get rid of the air that was put into your GI tract during the procedure and reduce the bloating. If you had a lower endoscopy (such as a colonoscopy or flexible sigmoidoscopy) you may notice spotting of blood in your stool or on the toilet paper. If you underwent a bowel prep for your procedure, you may not have a normal bowel movement for a few days.  Please Note:  You might notice some irritation and congestion in your nose or some drainage.  This is from the oxygen used during your procedure.  There is no need for concern and it should clear up in a day or so.  SYMPTOMS TO REPORT IMMEDIATELY:  Following lower endoscopy (colonoscopy or flexible sigmoidoscopy):  Excessive amounts of blood in the stool  Significant tenderness or worsening of abdominal pains  Swelling of the abdomen that is new, acute  Fever of 100F or higher   For urgent or emergent issues, a gastroenterologist can be reached at any hour by calling [phone]. Do not use MyChart messaging for urgent concerns.    DIET:  We do recommend a small meal at first, but then you may proceed to your regular diet.  Drink plenty of fluids but you should avoid alcoholic beverages for 24 hours.  ACTIVITY:  You should plan to take it easy  for the rest of today and you should NOT DRIVE or use heavy machinery until tomorrow (because of the sedation medicines used during the test).    FOLLOW UP: Our staff will call the number listed on your records 48-72 hours following your procedure to check on you and address any questions or concerns that you may have regarding the information given to you following your procedure. If we do not reach you, we will leave a message.  We will attempt to reach you two times.  During this call, we will ask if you have developed any symptoms of COVID 19. If you develop any symptoms (ie: fever, flu-like symptoms, shortness of breath, cough etc.) before then, please call [phone].  If you test positive for Covid 19 in the 2 weeks post procedure, please call and report this information to us.    If any biopsies were taken you will be contacted by phone or by letter within the next 1-3 weeks.  Please call us at [phone] if you have not heard about the biopsies in 3 weeks.    SIGNATURES/CONFIDENTIALITY: You and/or your care partner have signed paperwork which will be entered into your electronic medical record.  These signatures attest to the fact that that the information above on your After Visit Summary has been reviewed and is understood.  Full responsibility of the confidentiality of this discharge information lies with you and/or your care-partner.  

## 2021-06-12 NOTE — Progress Notes (Signed)
Report to PACU, RN, vss, BBS= Clear.  

## 2021-06-12 NOTE — Progress Notes (Signed)
Called to room to assist during endoscopic procedure.  Patient ID and intended procedure confirmed with present staff. Received instructions for my participation in the procedure from the performing physician.  

## 2021-06-16 ENCOUNTER — Telehealth: Payer: Self-pay

## 2021-06-16 ENCOUNTER — Telehealth: Payer: Self-pay | Admitting: *Deleted

## 2021-06-16 NOTE — Telephone Encounter (Signed)
  Follow up Call-  Call back number 06/12/2021  Post procedure Call Back phone  # [phone]  Permission to leave phone message Yes  Some recent data might be hidden     Patient questions:  Do you have a fever, pain , or abdominal swelling? No. Pain Score  0 *  Have you tolerated food without any problems? Yes.    Have you been able to return to your normal activities? Yes.    Do you have any questions about your discharge instructions: Diet   No. Medications  No. Follow up visit  No.  Do you have questions or concerns about your Care? No.  Actions: * If pain score is 4 or above: No action needed, pain <4.

## 2021-06-16 NOTE — Telephone Encounter (Signed)
No one answered phone.

## 2021-06-20 ENCOUNTER — Encounter: Payer: Self-pay | Admitting: Gastroenterology

## 2021-06-20 ENCOUNTER — Ambulatory Visit: Payer: PPO | Admitting: Family Medicine

## 2021-09-01 ENCOUNTER — Other Ambulatory Visit: Payer: Self-pay | Admitting: Family Medicine

## 2021-09-15 ENCOUNTER — Encounter: Payer: Self-pay | Admitting: Nurse Practitioner

## 2021-09-15 ENCOUNTER — Ambulatory Visit (INDEPENDENT_AMBULATORY_CARE_PROVIDER_SITE_OTHER): Payer: PPO | Admitting: Nurse Practitioner

## 2021-09-15 VITALS — BP 128/72 | HR 84 | Temp 97.6°F | Resp 16 | Ht 64.0 in | Wt 204.3 lb

## 2021-09-15 DIAGNOSIS — I10 Essential (primary) hypertension: Secondary | ICD-10-CM

## 2021-09-15 DIAGNOSIS — G20 Parkinson's disease: Secondary | ICD-10-CM | POA: Diagnosis not present

## 2021-09-15 DIAGNOSIS — E1165 Type 2 diabetes mellitus with hyperglycemia: Secondary | ICD-10-CM

## 2021-09-15 DIAGNOSIS — E7800 Pure hypercholesterolemia, unspecified: Secondary | ICD-10-CM | POA: Diagnosis not present

## 2021-09-15 DIAGNOSIS — F3112 Bipolar disorder, current episode manic without psychotic features, moderate: Secondary | ICD-10-CM

## 2021-09-15 DIAGNOSIS — F339 Major depressive disorder, recurrent, unspecified: Secondary | ICD-10-CM | POA: Diagnosis not present

## 2021-09-15 DIAGNOSIS — E669 Obesity, unspecified: Secondary | ICD-10-CM | POA: Diagnosis not present

## 2021-09-15 DIAGNOSIS — M797 Fibromyalgia: Secondary | ICD-10-CM | POA: Diagnosis not present

## 2021-09-15 DIAGNOSIS — E119 Type 2 diabetes mellitus without complications: Secondary | ICD-10-CM | POA: Diagnosis not present

## 2021-09-15 DIAGNOSIS — Z5181 Encounter for therapeutic drug level monitoring: Secondary | ICD-10-CM

## 2021-09-15 DIAGNOSIS — Z6834 Body mass index (BMI) 34.0-34.9, adult: Secondary | ICD-10-CM | POA: Diagnosis not present

## 2021-09-15 MED ORDER — QUINAPRIL HCL 20 MG PO TABS: 20.0000 mg | ORAL_TABLET | Freq: Every day | ORAL | 1 refills | Status: DC

## 2021-09-15 MED ORDER — PREGABALIN 50 MG PO CAPS
50.0000 mg | ORAL_CAPSULE | Freq: Two times a day (BID) | ORAL | 12 refills | Status: DC
Start: 2021-09-15 — End: 2023-06-30

## 2021-09-15 MED ORDER — TRESIBA FLEXTOUCH 200 UNIT/ML ~~LOC~~ SOPN: 20.0000 [IU] | PEN_INJECTOR | Freq: Every evening | SUBCUTANEOUS | 1 refills | Status: DC

## 2021-09-15 MED ORDER — AMLODIPINE BESYLATE 5 MG PO TABS: 5.0000 mg | ORAL_TABLET | Freq: Every day | ORAL | 3 refills | Status: DC

## 2021-09-15 NOTE — Progress Notes (Signed)
BP 128/72   Pulse 84   Temp 97.6 F (36.4 C)   Resp 16   Ht 5' 4"  (1.626 m)   Wt 204 lb 4.8 oz (92.7 kg)   SpO2 99%   BMI 35.07 kg/m    Subjective:    Patient ID: Bryan Flowers, male    DOB: 1960/12/20, 60 y.o.   MRN: [id number]  HPI: Bryan Flowers is a 60 y.o. male, here alone  Chief Complaint  Patient presents with   Hyperlipidemia   Hypertension   Diabetes   Depression   Hyperlipidemia: He takes his rosuvastatin 40 mg daily. Denies any myalgia. Last LDL was 129 on 05/15/21.  Will recheck today.   HTN: He does not check his blood pressure at home. He denies any chest pain, shortness of breath, headaches or blurred vision.  He is currently taking quinapril 20 mg daily, amlodipine 5 mg daily, propranolol 60 mg daily.  His blood pressure today was 128/72.   Diabetes: Foot exam up to date.  Ophthalmology exam was ordered on 03/20/21. He will call his optometrist for an appointment.  Fasting blood sugar has been running around 150-200 in am.  He denies any polyuria, polydipsia, and polyphagia. He says he is taking 50 units of Tresiba at night. He says "I think".  There is some confusion on how much he is taking.  He says he is taking 50 units but he also says he never adjusted his dose based on his blood sugar readings.  He says he just took it how he was told.  According to the notes [name] PA-C had him start at 20 units and he was supposed to titrate it.  However, he says he never did.  He says he is also taking rybelsus 7 mg daily and metformin 1000 mg BID. He has not seen Dr. Honor Junes yet.  Referral was placed on 03/24/21.  We will give him the information to call and make appointment.  Discussed the importance for getting and keeping this appointment.    Depression/bipolar: No longer having trouble sleeping, mood has been good. PHQ9 score is 0 today.  He is currently taking cymbalta 15m daily, seroquel 300 mg.  [facility]2021 05/15/2021 04/17/2021  03/20/2021 02/25/2021  Decreased Interest 0 1 0 0 0  Down, Depressed, Hopeless 0 1 0 0 0  PHQ - 2 Score 0 2 0 0 0  Altered sleeping 0 1 0 0 -  Tired, decreased energy 0 1 0 0 -  Change in appetite 0 0 0 0 -  Feeling bad or failure about yourself  0 1 0 0 -  Trouble concentrating 0 1 0 0 -  Moving slowly or fidgety/restless 0 1 0 0 -  Suicidal thoughts 0 0 0 0 -  PHQ-9 Score 0 7 0 0 -  Difficult doing work/chores Not difficult at all Somewhat difficult Not difficult at all Not difficult at all -  Some recent data might be hidden    Fibromyalgia: He is doing well.  He says he takes lyrica for pain due to his fibromyalgia.    Parkinson's: He is currently taking carbidopa-levodopa.  [place] He says he is doing well right now.   Relevant past medical, surgical, family and social history reviewed and updated as indicated. Interim medical history since our last visit reviewed. Allergies and medications reviewed and updated.  Review of Systems  Constitutional:  Negative for fever or weight change.  Respiratory: Negative for cough and shortness of breath.   Cardiovascular: Negative for chest pain or palpitations.  Gastrointestinal: Negative for abdominal pain, no bowel changes.  Musculoskeletal: Negative for gait problem or joint swelling.  Skin: Negative for rash.  Neurological: Negative for dizziness or headache.  No other specific complaints in a complete review of systems (except as listed in HPI above).      Objective:    BP 128/72   Pulse 84   Temp 97.6 F (36.4 C)   Resp 16   Ht 5' 4"  (1.626 m)   Wt 204 lb 4.8 oz (92.7 kg)   SpO2 99%   BMI 35.07 kg/m   Wt Readings from Last 3 Encounters:  09/15/21 204 lb 4.8 oz (92.7 kg)  06/12/21 199 lb (90.3 kg)  05/29/21 199 lb (90.3 kg)    Physical Exam  Constitutional: Patient appears well-developed and well-nourished. Obese No distress.  HEENT: head atraumatic,  normocephalic, pupils equal and reactive to light, neck supple Cardiovascular: Normal rate, regular rhythm and normal heart sounds.  No murmur heard. No BLE edema. Pulmonary/Chest: Effort normal and breath sounds normal. No respiratory distress. Abdominal: Soft.  There is no tenderness. Psychiatric: Patient has a normal mood and affect. behavior is normal. Judgment and thought content normal.   Results for orders placed or performed in visit on 05/15/21  CBC with Differential/Platelet  Result Value Ref Range   WBC 6.6 3.8 - 10.8 Thousand/uL   RBC 4.61 4.20 - 5.80 Million/uL   Hemoglobin 13.7 13.2 - 17.1 g/dL   HCT 41.8 38.5 - 50.0 %   MCV 90.7 80.0 - 100.0 fL   MCH 29.7 27.0 - 33.0 pg   MCHC 32.8 32.0 - 36.0 g/dL   RDW 12.4 11.0 - 15.0 %   Platelets 230 140 - 400 Thousand/uL   MPV 10.4 7.5 - 12.5 fL   Neutro Abs 4,772 1,500 - 7,800 cells/uL   Lymphs Abs 1,168 850 - 3,900 cells/uL   Absolute Monocytes 548 200 - 950 cells/uL   Eosinophils Absolute 73 15 - 500 cells/uL   Basophils Absolute 40 0 - 200 cells/uL   Neutrophils Relative % 72.3 %   Total Lymphocyte 17.7 %   Monocytes Relative 8.3 %   Eosinophils Relative 1.1 %   Basophils Relative 0.6 %  COMPLETE METABOLIC PANEL WITH GFR  Result Value Ref Range   Glucose, Bld 314 (H) 65 - 99 mg/dL   BUN 14 7 - 25 mg/dL   Creat 0.89 0.70 - 1.30 mg/dL   eGFR 99 > OR = 60 mL/min/1.77m   BUN/Creatinine Ratio NOT APPLICABLE 6 - 22 (calc)   Sodium 135 135 - 146 mmol/L   Potassium 4.4 3.5 - 5.3 mmol/L   Chloride 99 98 - 110 mmol/L   CO2 28 20 - 32 mmol/L   Calcium 9.5 8.6 - 10.3 mg/dL   Total Protein 6.5 6.1 - 8.1 g/dL   Albumin 4.4 3.6 - 5.1 g/dL   Globulin 2.1 1.9 - 3.7 g/dL (calc)   AG Ratio 2.1 1.0 - 2.5 (calc)   Total Bilirubin 0.4 0.2 - 1.2 mg/dL   Alkaline phosphatase (APISO) 58 35 - 144 U/L   AST 12 10 - 35 U/L   ALT 15 9 - 46 U/L  Hemoglobin A1c  Result Value Ref Range   Hgb A1c MFr Bld 10.4 (H) <5.7 % of total Hgb    Mean Plasma Glucose 252 mg/dL  eAG (mmol/L) 13.9 mmol/L  Lipid panel  Result Value Ref Range   Cholesterol 207 (H) <200 mg/dL   HDL 51 > OR = 40 mg/dL   Triglycerides 156 (H) <150 mg/dL   LDL Cholesterol (Calc) 129 (H) mg/dL (calc)   Total CHOL/HDL Ratio 4.1 <5.0 (calc)   Non-HDL Cholesterol (Calc) 156 (H) <130 mg/dL (calc)  CK  Result Value Ref Range   Total CK 48 44 - 196 U/L  HM DIABETES EYE EXAM  Result Value Ref Range   HM Diabetic Eye Exam No Retinopathy No Retinopathy      Assessment & Plan:   1. Pure hypercholesterolemia  - Lipid panel  2. Essential hypertension  - quinapril (ACCUPRIL) 20 MG tablet; Take 1 tablet (20 mg total) by mouth at bedtime.  Dispense: 90 tablet; Refill: 1 - amLODipine (NORVASC) 5 MG tablet; Take 1 tablet (5 mg total) by mouth daily.  Dispense: 90 tablet; Refill: 3 - COMPLETE METABOLIC PANEL WITH GFR  3. Uncontrolled type 2 diabetes mellitus with hyperglycemia (HCC)  - insulin degludec (TRESIBA FLEXTOUCH) 200 UNIT/ML FlexTouch Pen; Inject 20-50 Units into the skin every evening.  Dispense: 1 mL; Refill: 1  4. Bipolar 1 disorder, manic, moderate (HCC) -continue current treatment  5. Parkinson disease ([place]) -continue to see specialist  6. Fibromyalgia  - pregabalin (LYRICA) 50 MG capsule; Take 1 capsule (50 mg total) by mouth 2 (two) times daily.  Dispense: 60 capsule; Refill: 12  7. Encounter for medication monitoring  - quinapril (ACCUPRIL) 20 MG tablet; Take 1 tablet (20 mg total) by mouth at bedtime.  Dispense: 90 tablet; Refill: 1 - amLODipine (NORVASC) 5 MG tablet; Take 1 tablet (5 mg total) by mouth daily.  Dispense: 90 tablet; Refill: 3 - COMPLETE METABOLIC PANEL WITH GFR - Hemoglobin A1c - pregabalin (LYRICA) 50 MG capsule; Take 1 capsule (50 mg total) by mouth 2 (two) times daily.  Dispense: 60 capsule; Refill: 12   Follow up plan: Return in about 4 months (around 01/14/2022) for follow up.

## 2021-09-16 ENCOUNTER — Ambulatory Visit (INDEPENDENT_AMBULATORY_CARE_PROVIDER_SITE_OTHER): Payer: PPO | Admitting: Family Medicine

## 2021-09-16 ENCOUNTER — Telehealth: Payer: Self-pay

## 2021-09-16 ENCOUNTER — Encounter: Payer: Self-pay | Admitting: Family Medicine

## 2021-09-16 ENCOUNTER — Other Ambulatory Visit: Payer: Self-pay

## 2021-09-16 VITALS — BP 148/88 | HR 79 | Temp 97.8°F | Resp 16 | Ht 64.0 in | Wt 208.5 lb

## 2021-09-16 DIAGNOSIS — Z6835 Body mass index (BMI) 35.0-35.9, adult: Secondary | ICD-10-CM | POA: Diagnosis not present

## 2021-09-16 DIAGNOSIS — E1165 Type 2 diabetes mellitus with hyperglycemia: Secondary | ICD-10-CM

## 2021-09-16 DIAGNOSIS — E119 Type 2 diabetes mellitus without complications: Secondary | ICD-10-CM | POA: Diagnosis not present

## 2021-09-16 DIAGNOSIS — R4189 Other symptoms and signs involving cognitive functions and awareness: Secondary | ICD-10-CM | POA: Insufficient documentation

## 2021-09-16 DIAGNOSIS — Z794 Long term (current) use of insulin: Secondary | ICD-10-CM | POA: Diagnosis not present

## 2021-09-16 DIAGNOSIS — E7800 Pure hypercholesterolemia, unspecified: Secondary | ICD-10-CM

## 2021-09-16 DIAGNOSIS — G20 Parkinson's disease: Secondary | ICD-10-CM

## 2021-09-16 DIAGNOSIS — Z9114 Patient's other noncompliance with medication regimen: Secondary | ICD-10-CM | POA: Diagnosis not present

## 2021-09-16 DIAGNOSIS — R739 Hyperglycemia, unspecified: Secondary | ICD-10-CM | POA: Diagnosis not present

## 2021-09-16 DIAGNOSIS — F3112 Bipolar disorder, current episode manic without psychotic features, moderate: Secondary | ICD-10-CM | POA: Diagnosis not present

## 2021-09-16 DIAGNOSIS — G20A1 Parkinson's disease without dyskinesia, without mention of fluctuations: Secondary | ICD-10-CM

## 2021-09-16 DIAGNOSIS — Z91148 Patient's other noncompliance with medication regimen for other reason: Secondary | ICD-10-CM

## 2021-09-16 DIAGNOSIS — E66812 Obesity, class 2: Secondary | ICD-10-CM

## 2021-09-16 LAB — HEMOGLOBIN A1C
Hgb A1c MFr Bld: 11.4 % of total Hgb — ABNORMAL HIGH (ref <5.7)
Mean Plasma Glucose: 280 mg/dL
eAG (mmol/L): 15.5 mmol/L

## 2021-09-16 LAB — COMPLETE METABOLIC PANEL WITH GFR
AG Ratio: 2 (calc) (ref 1.0–2.5)
ALT: 26 U/L (ref 9–46)
AST: 16 U/L (ref 10–35)
Albumin: 4.3 g/dL (ref 3.6–5.1)
Alkaline phosphatase (APISO): 70 U/L (ref 35–144)
BUN: 19 mg/dL (ref 7–25)
CO2: 29 mmol/L (ref 20–32)
Calcium: 9.4 mg/dL (ref 8.6–10.3)
Chloride: 99 mmol/L (ref 98–110)
Creat: 0.95 mg/dL (ref 0.70–1.35)
Globulin: 2.2 g/dL (calc) (ref 1.9–3.7)
Glucose, Bld: 499 mg/dL — ABNORMAL HIGH (ref 65–99)
Potassium: 5 mmol/L (ref 3.5–5.3)
Sodium: 136 mmol/L (ref 135–146)
Total Bilirubin: 0.5 mg/dL (ref 0.2–1.2)
Total Protein: 6.5 g/dL (ref 6.1–8.1)
eGFR: 92 mL/min/{1.73_m2} (ref >=60)

## 2021-09-16 LAB — GLUCOSE, POCT (MANUAL RESULT ENTRY): POC Glucose: 426 mg/dl — AB (ref 70–99)

## 2021-09-16 LAB — LIPID PANEL
Cholesterol: 247 mg/dL — ABNORMAL HIGH (ref <200)
HDL: 44 mg/dL (ref >=40)
LDL Cholesterol (Calc): 156 mg/dL (calc) — ABNORMAL HIGH
Non-HDL Cholesterol (Calc): 203 mg/dL (calc) — ABNORMAL HIGH (ref <130)
Total CHOL/HDL Ratio: 5.6 (calc) — ABNORMAL HIGH (ref <5.0)
Triglycerides: 307 mg/dL — ABNORMAL HIGH (ref <150)

## 2021-09-16 MED ORDER — TRESIBA FLEXTOUCH 200 UNIT/ML ~~LOC~~ SOPN: 30.0000 [IU] | PEN_INJECTOR | Freq: Every evening | SUBCUTANEOUS | 11 refills | Status: DC

## 2021-09-16 NOTE — Patient Instructions (Addendum)
°  Long Acting Insulin:  You will begin with 30 units units, administer daily at bedtime (same time of day if able).  Begin checking your fasting blood sugar every morning - keep record on log or make sure always bring meter with you to appointments.  Goal range for fasting blood sugars:  80-150 - keep insulin dose the same    >150 - Increase insulin dose by 5 units and do not adjust again for 3 days    <80 or under 100 and feeling symptomatic - decrease insulin dose by 5 units and do not adjust again for 3 days  Follow up next week in office and bring your meter with you Follow up sooner if blood sugar too high or too low. Bring blood sugar log or glucometer with you to office visit.

## 2021-09-16 NOTE — Progress Notes (Signed)
Patient ID: Bryan Flowers, male    DOB: [birth year], 60 y.o.   MRN: [id number]  PCP: [name], PA-C  Chief Complaint  Patient presents with   Diabetes    Subjective:   Bryan Flowers is a 60 y.o. male, presents to clinic with CC of the following:  HPI  Hyperglycemia - critically high glucose from yesterdays labs - we called the pt and he had not checked his sugar yet today, then came in for CBG in office - per pt and per note pt is on insulin 50 units daily metformin and rybelsus Results for orders placed or performed in visit on 09/16/21  POCT Glucose (CBG)  Result Value Ref Range   POC Glucose 426 (A) 70 - 99 mg/dl   Pt reports today that he has been doing 50 units insulin - however - only did it once after getting the sample pen in clinic yesterday - and besides that he states that he hasn't been on insulin since xultophy was d/c - that was last year in Oct - I did switch him to oral rybelsus and tresiba - pt is a difficult historian - often isn't able to get meds due to cost/limited income, and often comes reporting one thing and later reports the opposite.  We previously had [facility] pharmacy consult to help get meds delivered monthly but pt did not like it   Lab Results  Component Value Date   HGBA1C 11.4 (H) 09/15/2021   Lab Results  Component Value Date   EGFR 92 09/15/2021      Patient Active Problem List   Diagnosis Date Noted   Insulin dependent type 2 diabetes mellitus, uncontrolled 03/20/2021   Pure hypercholesterolemia 02/16/2019   Grief 02/16/2019   Major depression, recurrent ([place]) 03/14/2018   Bipolar 1 disorder, manic, moderate (HCC)    Vitamin D deficiency 09/10/2017   Gastroesophageal reflux disease without esophagitis 09/10/2017   History of iron deficiency anemia 03/30/2016   History of iron deficiency anemia 03/24/2016   History of GI bleed 03/15/2016   Hyponatremia 03/15/2016   Parkinson disease ([place])    Essential hypertension    Anxiety  08/19/2015   Abnormal ECG 04/09/2015   Back ache 04/09/2015   Diabetes mellitus type 2 in obese ([place]) 04/09/2015   Obstructive sleep apnea 04/09/2015   HDL deficiency 04/09/2015   Hearing loss, sensorineural, combined types 04/09/2015   HLD (hyperlipidemia) 04/09/2015   Vocal cord atrophy 05/29/2014   Fibromyalgia 03/15/2014   Testicular hypofunction 05/17/2009   Migraine with aura 05/14/2009      Current Outpatient Medications:    acetaminophen (TYLENOL) 500 MG tablet, Take 1,000 mg by mouth every 6 (six) hours as needed (pain)., Disp: , Rfl:    amLODipine (NORVASC) 5 MG tablet, Take 1 tablet (5 mg total) by mouth daily., Disp: 90 tablet, Rfl: 3   Armodafinil 250 MG tablet, Take by mouth in the morning. , Disp: , Rfl:    b complex vitamins capsule, Take 1 capsule by mouth daily., Disp: , Rfl:    carbidopa-levodopa (SINEMET CR) 50-200 MG per tablet, Take 1 tablet by mouth 3 (three) times daily. , Disp: , Rfl:    cholecalciferol (VITAMIN D3) 25 MCG (1000 UT) tablet, Take 1,000 Units by mouth daily., Disp: , Rfl:    DULoxetine (CYMBALTA) 60 MG capsule, Take 120 mg by mouth at bedtime., Disp: , Rfl:    glucose blood (ONETOUCH VERIO) test strip, DX:E11.69, LON:99 Check TID, Disp:  100 each, Rfl: 12   glucose blood test strip, Check once daily fasting, and up to 3 times a day as needed., Disp: 100 each, Rfl: 12   insulin degludec (TRESIBA FLEXTOUCH) 200 UNIT/ML FlexTouch Pen, Inject 20-50 Units into the skin every evening., Disp: 1 mL, Rfl: 1   Insulin Pen Needle (NOVOFINE) 32G X 6 MM MISC, 1 each by Does not apply route daily., Disp: 100 each, Rfl: 3   lamoTRIgine (LAMICTAL) 200 MG tablet, Take 400 mg by mouth at bedtime. , Disp: , Rfl:    LORazepam (ATIVAN) 1 MG tablet, Typically takes  1 to 2 times daily, Disp: , Rfl:    metFORMIN (GLUCOPHAGE) 1000 MG tablet, Take 1 tablet (1,000 mg total) by mouth 2 (two) times daily with a meal., Disp: 180 tablet, Rfl: 3   pregabalin (LYRICA) 50 MG  capsule, Take 1 capsule (50 mg total) by mouth 2 (two) times daily., Disp: 60 capsule, Rfl: 12   propranolol ER (INDERAL LA) 60 MG 24 hr capsule, Take 60 mg by mouth at bedtime. , Disp: , Rfl:    QUEtiapine (SEROQUEL) 300 MG tablet, Take 300 mg by mouth at bedtime., Disp: , Rfl:    quinapril (ACCUPRIL) 20 MG tablet, Take 1 tablet (20 mg total) by mouth at bedtime., Disp: 90 tablet, Rfl: 1   rOPINIRole (REQUIP) 2 MG tablet, Take 2 mg by mouth 2 (two) times daily., Disp: , Rfl:    rosuvastatin (CRESTOR) 40 MG tablet, Take 1 tablet (40 mg total) by mouth at bedtime., Disp: 90 tablet, Rfl: 1   RYBELSUS 7 MG TABS, TAKE 1 TABLET BY MOUTH DAILY, Disp: 30 tablet, Rfl: 1   No Known Allergies   Social History   Tobacco Use   Smoking status: Never   Smokeless tobacco: Never   Tobacco comments:    Non-smoker  Vaping Use   Vaping Use: Never used  Substance Use Topics   Alcohol use: No    Alcohol/week: 0.0 standard drinks   Drug use: No      Chart Review Today: I personally reviewed active problem list, medication list, allergies, family history, social history, health maintenance, notes from last encounter, lab results, imaging with the patient/caregiver today.   Review of Systems  Constitutional: Negative.   HENT: Negative.    Eyes: Negative.   Respiratory: Negative.    Cardiovascular: Negative.   Gastrointestinal: Negative.   Endocrine: Negative.   Genitourinary: Negative.   Musculoskeletal: Negative.   Skin: Negative.   Allergic/Immunologic: Negative.   Neurological: Negative.   Hematological: Negative.   Psychiatric/Behavioral: Negative.    All other systems reviewed and are negative.     Objective:   Vitals:   09/16/21 1402  BP: (!) 148/88  Pulse: 79  Resp: 16  Temp: 97.8 F (36.6 C)  TempSrc: Oral  SpO2: 98%  Weight: 208 lb 8 oz (94.6 kg)  Height: 5' 4" (1.626 m)    Body mass index is 35.79 kg/m.  Physical Exam Vitals and nursing note reviewed.   Constitutional:      General: He is not in acute distress.    Appearance: He is obese. He is not ill-appearing, toxic-appearing or diaphoretic.  HENT:     Head: Normocephalic and atraumatic.     Right Ear: External ear normal.     Left Ear: External ear normal.  Eyes:     General: No scleral icterus.       Right eye: No discharge.  Left eye: No discharge.  Pulmonary:     Effort: Pulmonary effort is normal.  Neurological:     Mental Status: He is alert. Mental status is at baseline.  Psychiatric:        Attention and Perception: Attention normal.        Mood and Affect: Mood normal. Affect is flat.        Speech: Speech normal.        Behavior: Behavior is cooperative.        Cognition and Memory: Cognition is impaired. Memory is impaired.     Comments: Speech slightly slow     Results for orders placed or performed in visit on 09/16/21  POCT Glucose (CBG)  Result Value Ref Range   POC Glucose 426 (A) 70 - 99 mg/dl       Assessment & Plan:    1. Uncontrolled type 2 diabetes mellitus with hyperglycemia (HCC) Critical glucose from yesterdays labs and routine f/up visit - POCT Glucose (CBG) - insulin degludec (TRESIBA FLEXTOUCH) 200 UNIT/ML FlexTouch Pen; Inject 30-50 Units into the skin every evening.  Dispense: 15 mL; Refill: 11  2. Insulin dependent type 2 diabetes mellitus ([place]) Pt difficult historian - unclear why?  Unsure if it is memory issues? Cognitive decline? Or his level of understanding?  Yesterday to NP for for many visits over the past year pt states he is taking and giving himself 50 units insulin daily Today he states he has not been on insulin since last year when xultophy was d/c (oct 2021) - tresiba and levemir have been sent in for him multiple times, samples given multiple times and pt has been referred to endo  He did a first dose last night of 50 units (per his report) but glucose today is still >400, not checking sugars at home Another sample  was given We called his pharmacy - no Rx picked up or filled for over a year for any insulin   - POCT Glucose (CBG) - insulin degludec (TRESIBA FLEXTOUCH) 200 UNIT/ML FlexTouch Pen; Inject 30-50 Units into the skin every evening.  Dispense: 15 mL; Refill: 11  3. Noncompliance with medications Financial barriers, possibly pt does not understand?   - insulin degludec (TRESIBA FLEXTOUCH) 200 UNIT/ML FlexTouch Pen; Inject 30-50 Units into the skin every evening.  Dispense: 15 mL; Refill: 11   4. Disturbance of understanding?   Possibly refer for in depth cognitive eval?  5. Hyperglycemia  -  weight based dosing start for insulin = 28 units - severly uncontrolled - instructed pt to start tonight 30 units and monitor morning fasting glucose for the next 3 days - f/up with him Thurs/Friday over the phone and adjust his dose again as needed - and then f/up in office early next week with Bryan Flowers who saw him yesterday.  Discussed with Bryan Flowers dosing per UTD - for pt weight 94.6 kg x 0.3 units/kg/day = 28.4 units daily - pt to do 30 units for 3 d Per UTD: In patients with HbA1c >8%, fasting plasma glucose >250 mg/dL, or insulin resistance, 0.2 to 0.3 units/kg/day is recommended  Dosage adjustment: For persistently elevated fasting plasma glucose: SUBQ: Increase daily dose by 2 to 4 units or by 10% to 20% every 3 to 7 days to achieve fasting plasma glucose target while avoiding hypoglycemia  Will f/up with pharmacy about insurance coverage for tresiba - he may need to take levemir instead F/up with endo next week - he states he  placed a call to them today Pt may benefit from freestyle meter with reader (not cell phone) and close f/up every 2 weeks until sugars are controlled or pt gets into endo     ICD-10-CM   1. Uncontrolled type 2 diabetes mellitus with hyperglycemia (HCC)  E11.65 POCT Glucose (CBG)    insulin degludec (TRESIBA FLEXTOUCH) 200 UNIT/ML FlexTouch Pen    2. Insulin dependent  type 2 diabetes mellitus (HCC)  E11.9 POCT Glucose (CBG)   Z79.4 insulin degludec (TRESIBA FLEXTOUCH) 200 UNIT/ML FlexTouch Pen    3. Noncompliance with medications  Z91.14 insulin degludec (TRESIBA FLEXTOUCH) 200 UNIT/ML FlexTouch Pen    4. Disturbance of understanding  R41.89     5. Hyperglycemia  R73.9     6. Parkinson disease ([place])  G20     7. Bipolar 1 disorder, manic, moderate (HCC)  F31.12     8. Pure hypercholesterolemia  E78.00    worse - likely off statin meds    9. Class 2 severe obesity with serious comorbidity and body mass index (BMI) of 35.0 to 35.9 in adult, unspecified obesity type ([place])  E66.01    Z68.35    increased BMI with multiple comorbidities including uncontrolled IDDM, HLD, HTN, OSA, parkinsons, bipolar and other dx as noted above         [name], PA-C 09/16/21 2:22 PM

## 2021-09-16 NOTE — Telephone Encounter (Signed)
Critical lab: Glucose 499+  please review

## 2021-09-16 NOTE — Telephone Encounter (Signed)
Pt did not answer will try again

## 2021-09-16 NOTE — Telephone Encounter (Signed)
Pt reached, informed him of reading.  Asked if he had checked glucose this am, he had not.  Told him to check and let us know of reading or to come by for [place] to re check.

## 2021-09-17 DIAGNOSIS — F3181 Bipolar II disorder: Secondary | ICD-10-CM | POA: Diagnosis not present

## 2021-09-17 DIAGNOSIS — G2581 Restless legs syndrome: Secondary | ICD-10-CM | POA: Diagnosis not present

## 2021-09-17 DIAGNOSIS — G4726 Circadian rhythm sleep disorder, shift work type: Secondary | ICD-10-CM | POA: Diagnosis not present

## 2021-09-23 ENCOUNTER — Ambulatory Visit: Payer: PPO | Admitting: Nurse Practitioner

## 2021-12-05 DIAGNOSIS — H9193 Unspecified hearing loss, bilateral: Secondary | ICD-10-CM | POA: Diagnosis not present

## 2022-01-14 ENCOUNTER — Ambulatory Visit: Payer: PPO | Admitting: Nurse Practitioner

## 2022-01-14 NOTE — Progress Notes (Deleted)
? ?  There were no vitals taken for this visit.  ? ?Subjective:  ? ? Patient ID: Bryan Flowers, male    DOB: 1960/12/04, 61 y.o.   MRN: [id number] ? ?HPI: ?Bryan Flowers is a 61 y.o. male ? ?No chief complaint on file. ? ? ?Relevant past medical, surgical, family and social history reviewed and updated as indicated. Interim medical history since our last visit reviewed. ?Allergies and medications reviewed and updated. ? ?Review of Systems ? ?Per HPI unless specifically indicated above ? ?   ?Objective:  ?  ?There were no vitals taken for this visit.  ?Wt Readings from Last 3 Encounters:  ?09/16/21 208 lb 8 oz (94.6 kg)  ?09/15/21 204 lb 4.8 oz (92.7 kg)  ?06/12/21 199 lb (90.3 kg)  ?  ?Physical Exam ? ?Results for orders placed or performed in visit on 09/16/21  ?POCT Glucose (CBG)  ?Result Value Ref Range  ? POC Glucose 426 (A) 70 - 99 mg/dl  ? ?   ?Assessment & Plan:  ? ?Problem List Items Addressed This Visit   ?None ?  ? ?Follow up plan: ?No follow-ups on file. ? ? ? ? ? ?

## 2022-02-26 ENCOUNTER — Ambulatory Visit: Payer: PPO

## 2022-03-05 ENCOUNTER — Ambulatory Visit: Payer: PPO

## 2022-03-16 ENCOUNTER — Telehealth: Payer: Self-pay | Admitting: Nurse Practitioner

## 2022-03-16 NOTE — Telephone Encounter (Signed)
Copied from [place] [id number]. Topic: Medicare AWV >> Mar 16, 2022  2:44 PM Jae Dire wrote: Reason for CRM:  Left message for patient to call back and schedule Medicare Annual Wellness Visit (AWV) in office.   If unable to come into the office for AWV,  please offer to do virtually or by telephone.  Last AWV: 02/25/2021  Please schedule at anytime with [place].  30 minute appointment for Virtual or phone 45 minute appointment for in office or Initial virtual/phone  Any questions, please contact me at [phone]

## 2022-04-28 ENCOUNTER — Emergency Department
Admission: EM | Admit: 2022-04-28 | Discharge: 2022-04-30 | Disposition: A | Discharge status: Home / Self Care | Payer: PPO | Attending: Emergency Medicine | Admitting: Emergency Medicine

## 2022-04-28 ENCOUNTER — Other Ambulatory Visit: Payer: Self-pay

## 2022-04-28 DIAGNOSIS — F339 Major depressive disorder, recurrent, unspecified: Secondary | ICD-10-CM | POA: Insufficient documentation

## 2022-04-28 DIAGNOSIS — M797 Fibromyalgia: Secondary | ICD-10-CM | POA: Diagnosis not present

## 2022-04-28 DIAGNOSIS — E119 Type 2 diabetes mellitus without complications: Secondary | ICD-10-CM | POA: Diagnosis not present

## 2022-04-28 DIAGNOSIS — Z91148 Patient's other noncompliance with medication regimen for other reason: Secondary | ICD-10-CM | POA: Insufficient documentation

## 2022-04-28 DIAGNOSIS — H905 Unspecified sensorineural hearing loss: Secondary | ICD-10-CM | POA: Diagnosis not present

## 2022-04-28 DIAGNOSIS — F419 Anxiety disorder, unspecified: Secondary | ICD-10-CM | POA: Diagnosis present

## 2022-04-28 DIAGNOSIS — G20 Parkinson's disease: Secondary | ICD-10-CM | POA: Diagnosis not present

## 2022-04-28 DIAGNOSIS — G20A1 Parkinson's disease without dyskinesia, without mention of fluctuations: Secondary | ICD-10-CM | POA: Diagnosis present

## 2022-04-28 DIAGNOSIS — I10 Essential (primary) hypertension: Secondary | ICD-10-CM | POA: Diagnosis not present

## 2022-04-28 DIAGNOSIS — R45851 Suicidal ideations: Secondary | ICD-10-CM

## 2022-04-28 DIAGNOSIS — R4189 Other symptoms and signs involving cognitive functions and awareness: Secondary | ICD-10-CM | POA: Diagnosis present

## 2022-04-28 DIAGNOSIS — F3112 Bipolar disorder, current episode manic without psychotic features, moderate: Secondary | ICD-10-CM | POA: Diagnosis present

## 2022-04-28 DIAGNOSIS — M549 Dorsalgia, unspecified: Secondary | ICD-10-CM | POA: Diagnosis present

## 2022-04-28 DIAGNOSIS — F331 Major depressive disorder, recurrent, moderate: Secondary | ICD-10-CM | POA: Diagnosis not present

## 2022-04-28 DIAGNOSIS — F4321 Adjustment disorder with depressed mood: Secondary | ICD-10-CM | POA: Diagnosis not present

## 2022-04-28 DIAGNOSIS — F32A Depression, unspecified: Secondary | ICD-10-CM | POA: Diagnosis not present

## 2022-04-28 DIAGNOSIS — Z20822 Contact with and (suspected) exposure to covid-19: Secondary | ICD-10-CM | POA: Diagnosis not present

## 2022-04-28 DIAGNOSIS — E1169 Type 2 diabetes mellitus with other specified complication: Secondary | ICD-10-CM | POA: Diagnosis present

## 2022-04-28 LAB — COMPREHENSIVE METABOLIC PANEL
ALT: 18 U/L (ref 0–44)
AST: 17 U/L (ref 15–41)
Albumin: 4.5 g/dL (ref 3.5–5.0)
Alkaline Phosphatase: 65 U/L (ref 38–126)
Anion gap: 8 (ref 5–15)
BUN: 13 mg/dL (ref 6–20)
CO2: 25 mmol/L (ref 22–32)
Calcium: 9.5 mg/dL (ref 8.9–10.3)
Chloride: 101 mmol/L (ref 98–111)
Creatinine, Ser: 0.97 mg/dL (ref 0.61–1.24)
GFR, Estimated: >60 mL/min (ref >60)
Glucose, Bld: 311 mg/dL — ABNORMAL HIGH (ref 70–99)
Potassium: 5.6 mmol/L — ABNORMAL HIGH (ref 3.5–5.1)
Sodium: 134 mmol/L — ABNORMAL LOW (ref 135–145)
Total Bilirubin: 1.3 mg/dL — ABNORMAL HIGH (ref 0.3–1.2)
Total Protein: 7.7 g/dL (ref 6.5–8.1)

## 2022-04-28 LAB — CBC
HCT: 52.4 % — ABNORMAL HIGH (ref 39.0–52.0)
Hemoglobin: 17.5 g/dL — ABNORMAL HIGH (ref 13.0–17.0)
MCH: 28.8 pg (ref 26.0–34.0)
MCHC: 33.4 g/dL (ref 30.0–36.0)
MCV: 86.3 fL (ref 80.0–100.0)
Platelets: 301 10*3/uL (ref 150–400)
RBC: 6.07 MIL/uL — ABNORMAL HIGH (ref 4.22–5.81)
RDW: 12.5 % (ref 11.5–15.5)
WBC: 6.5 10*3/uL (ref 4.0–10.5)
nRBC: 0 % (ref 0.0–0.2)

## 2022-04-28 LAB — ACETAMINOPHEN LEVEL: Acetaminophen (Tylenol), Serum: <10 ug/mL — ABNORMAL LOW (ref 10–30)

## 2022-04-28 LAB — ETHANOL: Alcohol, Ethyl (B): <10 mg/dL (ref <10)

## 2022-04-28 LAB — RESP PANEL BY RT-PCR (FLU A&B, COVID) ARPGX2
Influenza A by PCR: NEGATIVE
Influenza B by PCR: NEGATIVE
SARS Coronavirus 2 by RT PCR: NEGATIVE

## 2022-04-28 LAB — SALICYLATE LEVEL: Salicylate Lvl: <7 mg/dL — ABNORMAL LOW (ref 7.0–30.0)

## 2022-04-28 NOTE — ED Provider Notes (Addendum)
[facility] Provider Note    Event Date/Time   First MD Initiated Contact with Patient 04/28/22 2056     (approximate)   History   Depression and Suicidal   HPI  Bryan Flowers is a 60 y.o. male with past medical history of hypertension hyperlipidemia Parkinson disease bipolar disorder depression presents with depression.  Patient has had severe depressive thoughts lately.  Has had thoughts of suicide but denies plan.  He it is his 4-year anniversary of his wife's death and he recently broke up with his girlfriend.  His power went out and he also cannot afford his medications.  Denies any medical issues.  Not taking drugs or alcohol.     Past Medical History:  Diagnosis Date   Anemia    iron deficiency   Anxiety    Bipolar 1 disorder, manic, moderate ([place])    Blood transfusion without reported diagnosis    due to GI bleeding-ulcers   Claudication ([place]) 07/16/2017   Depression    Diabetes mellitus without complication ([place])    GERD (gastroesophageal reflux disease)    Hyperlipidemia    Hypertension    Insomnia    Parkinson disease ([place])    Peptic ulcer disease with hemorrhage    Sleep apnea    on cpap    Patient Active Problem List   Diagnosis Date Noted   Uncontrolled type 2 diabetes mellitus with hyperglycemia ([place]) 09/16/2021   Disturbance of understanding 09/16/2021   Noncompliance with medications 09/16/2021   Insulin dependent type 2 diabetes mellitus ([place]) 03/20/2021   Class 2 severe obesity with serious comorbidity and body mass index (BMI) of 35.0 to 35.9 in adult ([place]) 02/16/2019   Pure hypercholesterolemia 02/16/2019   Grief 02/16/2019   Major depression, recurrent ([place]) 03/14/2018   Bipolar 1 disorder, manic, moderate ([place])    Vitamin D deficiency 09/10/2017   Gastroesophageal reflux disease without esophagitis 09/10/2017   History of iron deficiency anemia 03/30/2016   History of iron deficiency anemia 03/24/2016   History  of GI bleed 03/15/2016   Hyponatremia 03/15/2016   Parkinson disease ([place])    Essential hypertension    Anxiety 08/19/2015   Abnormal ECG 04/09/2015   Back ache 04/09/2015   Diabetes mellitus type 2 in obese ([place]) 04/09/2015   Obstructive sleep apnea 04/09/2015   HDL deficiency 04/09/2015   Hearing loss, sensorineural, combined types 04/09/2015   HLD (hyperlipidemia) 04/09/2015   Vocal cord atrophy 05/29/2014   Fibromyalgia 03/15/2014   Testicular hypofunction 05/17/2009   Migraine with aura 05/14/2009     Physical Exam  Triage Vital Signs: ED Triage Vitals  Enc Vitals Group     BP 04/28/22 2030 (!) 169/100     Pulse Rate 04/28/22 2030 80     Resp 04/28/22 2030 17     Temp 04/28/22 2030 98.1 F (36.7 C)     Temp Source 04/28/22 2030 Oral     SpO2 04/28/22 2030 100 %     Weight 04/28/22 2033 185 lb (83.9 kg)     Height 04/28/22 2033 '5\' 11"'$  (1.803 m)     Head Circumference --      Peak Flow --      Pain Score 04/28/22 2033 8     Pain Loc --      Pain Edu? --      Excl. in [place]? --     Most recent vital signs: Vitals:   04/28/22 2030  BP: (!) 169/100  Pulse: 80  Resp: 17  Temp: 98.1 F (36.7 C)  SpO2: 100%     General: Awake, no distress.  CV:  Good peripheral perfusion.  Resp:  Normal effort.  Abd:  No distention.  Neuro:             Awake, Alert, Oriented x 3  Other:  + SI, flat affect, slowed speech, calm no agitation   ED Results / Procedures / Treatments  Labs (all labs ordered are listed, but only abnormal results are displayed) Labs Reviewed  COMPREHENSIVE METABOLIC PANEL - Abnormal; Notable for the following components:      Result Value   Sodium 134 (*)    Potassium 5.6 (*)    Glucose, Bld 311 (*)    Total Bilirubin 1.3 (*)    All other components within normal limits  CBC - Abnormal; Notable for the following components:   RBC 6.07 (*)    Hemoglobin 17.5 (*)    HCT 52.4 (*)    All other components within normal limits  RESP PANEL BY  RT-PCR (FLU A&B, COVID) ARPGX2  ETHANOL  SALICYLATE LEVEL  ACETAMINOPHEN LEVEL  URINE DRUG SCREEN, QUALITATIVE ([facility] ONLY)     EKG     RADIOLOGY    PROCEDURES:  Critical Care performed: No  Procedures   MEDICATIONS ORDERED IN ED: Medications - No data to display   IMPRESSION / MDM / [place] / ED COURSE  I reviewed the triage vital signs and the nursing notes.                              Patient's presentation is most consistent with acute presentation with potential threat to life or bodily function.  Differential diagnosis includes, but is not limited to, major depressive disorder, adjustment disorder, medication noncompliance  Patient is a 61 year old male with history of depression Parkinson's disease bipolar disorder presents with depression and passive SI.  He was referred by RJ to the emergency department.  He is here voluntarily.  He does not have a plan but does appear very flat with slowed speech.  I am concerned about his wellbeing.  He has not been taking his medications consistently.  He is mildly hypertensive.  Blood work overall reassuring he is mildly polycythemic and hyperglycemic potassium elevated at 5.6 but hemolyzed.  Plan to consult psychiatry.  We will keep voluntary.  The patient has been placed in psychiatric observation due to the need to provide a safe environment for the patient while obtaining psychiatric consultation and evaluation, as well as ongoing medical and medication management to treat the patient's condition.  The patient has not been placed under full IVC at this time.   Patient seen by psychiatry and they recommend inpatient admission.  He needs med rec prior to meds being ordered.     FINAL CLINICAL IMPRESSION(S) / ED DIAGNOSES   Final diagnoses:  Suicidal ideation  Depression, unspecified depression type     Rx / DC Orders   ED Discharge Orders     None        Note:  This document was prepared using  Dragon voice recognition software and may include unintentional dictation errors.   Rada Hay, MD 04/28/22 2144    Rada Hay, MD 04/28/22 2155

## 2022-04-28 NOTE — ED Triage Notes (Signed)
Pt presents to ER from home with c/o severe depression, and suicidal thoughts.  Pt states he has not been able to take his meds in last 2-3 weeks d/t affordability.  Pt states he has gone through recent stress in his life with pt and his s/o breaking up, and problems with paying bills.  Pt states he has hx of depression, BPD and parkinson's.  Pt endorses SI without plan, but denies HI, AVH, and states he has not been able to eat and sleep much lately.  Pt otherwise A&O x4 at this time in NAD.

## 2022-04-28 NOTE — ED Notes (Signed)
Pt given snack and beverage upon request

## 2022-04-28 NOTE — ED Notes (Signed)
Vol, pending psych   Rada Hay, MD 04/28/22 2141

## 2022-04-28 NOTE — ED Notes (Signed)
Pt dressed out in triage 3 with this Probation officer and Gilmore Laroche, EDT.    Pt belongings:  Blue t-shirt  [employer] and red belt  [employer] blue underwear  White socks  White tennis shoes  Blue cell phone  66 cents loose change  Car/house keys

## 2022-04-28 NOTE — BH Assessment (Signed)
Comprehensive Clinical Assessment (CCA) Note  04/28/2022 Bryan Flowers [id number]  Chief Complaint: Patient is a 61 year old male presenting to [facility] ED voluntarily. Per triage note Pt presents to ER from home with c/o severe depression, and suicidal thoughts.  Pt states he has not been able to take his meds in last 2-3 weeks d/t affordability.  Pt states he has gone through recent stress in his life with pt and his s/o breaking up, and problems with paying bills.  Pt states he has hx of depression, BPD and parkinson's.  Pt endorses SI without plan, but denies HI, AVH, and states he has not been able to eat and sleep much lately.  Pt otherwise A&O x4 at this time in NAD. During assessment patient appears alert and oriented x4, calm and cooperative, mood appears depressed. Patient reports "I feel really depressed, I'm going through a break-up with my current partner and my wife passed away 4 years ago due to dementia." Patient reports that he and his current partner "we talked about getting married and she decided that she didn't want to get married this week." Patient also reports some financial issues "my power got turned off this week and my phone got turned off today." Patient also reports that he has not been taking his medications. Patient reports that he is currently on disability but that his income is not enough. Patient denies HI/AH/VH.  Per Psyc NP Ysidro Evert patient is recommended for Inpatient  Chief Complaint  Patient presents with   Depression   Suicidal   Visit Diagnosis: Depression, Bipolar    CCA Screening, Triage and Referral (STR)  Patient Reported Information How did you hear about [place]? Self  Referral name: No data recorded Referral phone number: No data recorded  Whom do you see for routine medical problems? No data recorded Practice/Facility Name: No data recorded Practice/Facility Phone Number: No data recorded Name of Contact: No data recorded Contact Number:  No data recorded Contact Fax Number: No data recorded Prescriber Name: No data recorded Prescriber Address (if known): No data recorded  What Is the Reason for Your Visit/Call Today? Patient presents voluntarily due to depression  How Long Has This Been Causing You Problems? 1-6 months  What Do You Feel Would Help You the Most Today? Treatment for Depression or other mood problem   Have You Recently Been in Any Inpatient Treatment (Hospital/Detox/Crisis Center/28-Day Program)? No data recorded Name/Location of Program/Hospital:No data recorded How Long Were You There? No data recorded When Were You Discharged? No data recorded  Have You Ever Received Services From [facility] Before? No data recorded Who Do You See at [facility]? No data recorded  Have You Recently Had Any Thoughts About Hurting Yourself? No  Are You Planning to Commit Suicide/Harm Yourself At This time? No   Have you Recently Had Thoughts About [place]? No  Explanation: No data recorded  Have You Used Any Alcohol or Drugs in the Past 24 Hours? No  How Long Ago Did You Use Drugs or Alcohol? No data recorded What Did You Use and How Much? No data recorded  Do You Currently Have a Therapist/Psychiatrist? No  Name of Therapist/Psychiatrist: No data recorded  Have You Been Recently Discharged From Any Office Practice or Programs? No  Explanation of Discharge From Practice/Program: No data recorded    CCA Screening Triage Referral Assessment Type of Contact: Face-to-Face  Is this Initial or Reassessment? No data recorded Date Telepsych consult ordered in  CHL:  No data recorded Time Telepsych consult ordered in CHL:  No data recorded  Patient Reported Information Reviewed? No data recorded Patient Left Without Being Seen? No data recorded Reason for Not Completing Assessment: No data recorded  Collateral Involvement: No data recorded  Does Patient Have a [place]?  No data recorded Name and Contact of Legal Guardian: No data recorded If Minor and Not Living with Parent(s), Who has Custody? No data recorded Is CPS involved or ever been involved? Never  Is APS involved or ever been involved? Never   Patient Determined To Be At Risk for Harm To Self or Others Based on Review of Patient Reported Information or Presenting Complaint? No  Method: No data recorded Availability of Means: No data recorded Intent: No data recorded Notification Required: No data recorded Additional Information for Danger to Others Potential: No data recorded Additional Comments for Danger to Others Potential: No data recorded Are There Guns or Other Weapons in Your Home? No data recorded Types of Guns/Weapons: No data recorded Are These Weapons Safely Secured?                            No data recorded Who Could Verify You Are Able To Have These Secured: No data recorded Do You Have any Outstanding Charges, Pending Court Dates, Parole/Probation? No data recorded Contacted To Inform of Risk of Harm To Self or Others: No data recorded  Location of Assessment: [facility] ED   Does Patient Present under Involuntary Commitment? No  IVC Papers Initial File Date: No data recorded  [place] of Residence: [place]   Patient Currently Receiving the Following Services: No data recorded  Determination of Need: Emergent (2 hours)   Options For Referral: No data recorded    CCA Biopsychosocial Intake/Chief Complaint:  No data recorded Current Symptoms/Problems: No data recorded  Patient Reported Schizophrenia/Schizoaffective Diagnosis in Past: No   Strengths: Patient is able to communicate his needs  Preferences: No data recorded Abilities: No data recorded  Type of Services Patient Feels are Needed: No data recorded  Initial Clinical Notes/Concerns: No data recorded  Mental Health Symptoms Depression:   Change in energy/activity; Difficulty Concentrating; Fatigue;  Hopelessness   Duration of Depressive symptoms:  Greater than two weeks   Mania:   None   Anxiety:    None   Psychosis:   None   Duration of Psychotic symptoms: No data recorded  Trauma:   None   Obsessions:   None   Compulsions:   None   Inattention:   None   Hyperactivity/Impulsivity:   None   Oppositional/Defiant Behaviors:   None   Emotional Irregularity:   None   Other Mood/Personality Symptoms:  No data recorded   Mental Status Exam Appearance and self-care  Stature:   Tall   Weight:   Average weight   Clothing:   Casual   Grooming:   Normal   Cosmetic use:   None   Posture/gait:   Normal   Motor activity:   Not Remarkable   Sensorium  Attention:   Normal   Concentration:   Normal   Orientation:   X5   Recall/memory:   Normal   Affect and Mood  Affect:   Appropriate   Mood:   Depressed   Relating  Eye contact:   Normal   Facial expression:   Depressed   Attitude toward examiner:   Cooperative   Thought and  Language  Speech flow:  Clear and Coherent   Thought content:   Appropriate to Mood and Circumstances   Preoccupation:   None   Hallucinations:   None   Organization:  No data recorded  [employer] of Knowledge:   Fair   Intelligence:   Average   Abstraction:   Normal   Judgement:   Fair   Art therapist:   Realistic   Insight:   Fair   Decision Making:   Normal   Social Functioning  Social Maturity:   Responsible   Social Judgement:   Normal   Stress  Stressors:   Relationship; Grief/losses; Financial; Housing   Coping Ability:   Normal   Skill Deficits:   None   Supports:   Family     Religion: Religion/Spirituality Are You A Religious Person?: No  Leisure/Recreation: Leisure / Recreation Do You Have Hobbies?: No  Exercise/Diet: Exercise/Diet Do You Exercise?: No Have You Gained or Lost A Significant Amount of Weight in the Past Six  Months?: No Do You Follow a Special Diet?: No Do You Have Any Trouble Sleeping?: No   CCA Employment/Education Employment/Work Situation: Employment / Work Situation Employment Situation: On disability Why is Patient on Disability: "Bipolar" and "Cerebal Palsy" How Long has Patient Been on Disability: Unknown Has Patient ever Been in the [employer]?: No  Education: Education Is Patient Currently Attending School?: No Did You Have An Individualized Education Program (IIEP): No Did You Have Any Difficulty At School?: No Patient's Education Has Been Impacted by Current Illness: No   CCA Family/Childhood History Family and Relationship History: Family history Marital status: Widowed Widowed, when?: "4 years ago" Does patient have children?: Yes How many children?: 2 How is patient's relationship with their children?: Patient reports having 2 step children  Childhood History:  Childhood History Did patient suffer any verbal/emotional/physical/sexual abuse as a child?: No Did patient suffer from severe childhood neglect?: No Has patient ever been sexually abused/assaulted/raped as an adolescent or adult?: No Was the patient ever a victim of a crime or a disaster?: No Witnessed domestic violence?: No Has patient been affected by domestic violence as an adult?: No  Child/Adolescent Assessment:     CCA Substance Use Alcohol/Drug Use: Alcohol / Drug Use Pain Medications: See MAR Prescriptions: See MAR Over the Counter: See MAR History of alcohol / drug use?: No history of alcohol / drug abuse                         ASAM's:  Six Dimensions of Multidimensional Assessment  Dimension 1:  Acute Intoxication and/or Withdrawal Potential:      Dimension 2:  Biomedical Conditions and Complications:      Dimension 3:  Emotional, Behavioral, or Cognitive Conditions and Complications:     Dimension 4:  Readiness to Change:     Dimension 5:  Relapse, Continued use, or  Continued Problem Potential:     Dimension 6:  Recovery/Living Environment:     ASAM Severity Score:    ASAM Recommended Level of Treatment:     Substance use Disorder (SUD)    Recommendations for Services/Supports/Treatments:    DSM5 Diagnoses: Patient Active Problem List   Diagnosis Date Noted   Uncontrolled type 2 diabetes mellitus with hyperglycemia ([place]) 09/16/2021   Disturbance of understanding 09/16/2021   Noncompliance with medications 09/16/2021   Insulin dependent type 2 diabetes mellitus ([place]) 03/20/2021   Class 2 severe obesity with serious comorbidity  and body mass index (BMI) of 35.0 to 35.9 in adult [facility]) 02/16/2019   Pure hypercholesterolemia 02/16/2019   Grief 02/16/2019   Major depression, recurrent ([place]) 03/14/2018   Bipolar 1 disorder, manic, moderate (HCC)    Vitamin D deficiency 09/10/2017   Gastroesophageal reflux disease without esophagitis 09/10/2017   History of iron deficiency anemia 03/30/2016   History of iron deficiency anemia 03/24/2016   History of GI bleed 03/15/2016   Hyponatremia 03/15/2016   Parkinson disease ([place])    Essential hypertension    Anxiety 08/19/2015   Abnormal ECG 04/09/2015   Back ache 04/09/2015   Diabetes mellitus type 2 in obese ([place]) 04/09/2015   Obstructive sleep apnea 04/09/2015   HDL deficiency 04/09/2015   Hearing loss, sensorineural, combined types 04/09/2015   HLD (hyperlipidemia) 04/09/2015   Vocal cord atrophy 05/29/2014   Fibromyalgia 03/15/2014   Testicular hypofunction 05/17/2009   Migraine with aura 05/14/2009    Patient Centered Plan: Patient is on the following Treatment Plan(s):  Depression   Referrals to Alternative Service(s): Referred to Alternative Service(s):   Place:   Date:   Time:    Referred to Alternative Service(s):   Place:   Date:   Time:    Referred to Alternative Service(s):   Place:   Date:   Time:    Referred to Alternative Service(s):   Place:   Date:   Time:       '@BHCOLLABOFCARE'$ @  [employer], LCAS-A

## 2022-04-29 ENCOUNTER — Encounter (HOSPITAL_COMMUNITY): Payer: Self-pay | Admitting: Hematology

## 2022-04-29 DIAGNOSIS — F331 Major depressive disorder, recurrent, moderate: Secondary | ICD-10-CM | POA: Diagnosis not present

## 2022-04-29 DIAGNOSIS — F32A Depression, unspecified: Secondary | ICD-10-CM | POA: Diagnosis not present

## 2022-04-29 LAB — CBG MONITORING, ED
Glucose-Capillary: 522 mg/dL (ref 70–99)
Glucose-Capillary: 467 mg/dL — ABNORMAL HIGH (ref 70–99)
Glucose-Capillary: 262 mg/dL — ABNORMAL HIGH (ref 70–99)
Glucose-Capillary: 204 mg/dL — ABNORMAL HIGH (ref 70–99)

## 2022-04-29 LAB — URINE DRUG SCREEN, QUALITATIVE (ARMC ONLY)
Amphetamines, Ur Screen: NOT DETECTED
Barbiturates, Ur Screen: NOT DETECTED
Benzodiazepine, Ur Scrn: POSITIVE — AB
Cannabinoid 50 Ng, Ur ~~LOC~~: NOT DETECTED
Cocaine Metabolite,Ur ~~LOC~~: NOT DETECTED
MDMA (Ecstasy)Ur Screen: NOT DETECTED
Methadone Scn, Ur: NOT DETECTED
Opiate, Ur Screen: NOT DETECTED
Phencyclidine (PCP) Ur S: NOT DETECTED
Tricyclic, Ur Screen: NOT DETECTED

## 2022-04-29 LAB — HEMOGLOBIN A1C
Hgb A1c MFr Bld: 12.1 % — ABNORMAL HIGH (ref 4.8–5.6)
Mean Plasma Glucose: 300.57 mg/dL

## 2022-04-29 MED ORDER — AMLODIPINE BESYLATE 5 MG PO TABS
5.0000 mg | ORAL_TABLET | Freq: Every day | ORAL | Status: DC
  Administered 2022-04-29 – 2022-04-30 (×2): 5 mg via ORAL
  Filled 2022-04-29 (×2): qty 1

## 2022-04-29 MED ORDER — INSULIN ASPART 100 UNIT/ML IJ SOLN
0.0000 [IU] | Freq: Three times a day (TID) | INTRAMUSCULAR | Status: DC
  Administered 2022-04-29: 20 [IU] via SUBCUTANEOUS
  Administered 2022-04-30: 15 [IU] via SUBCUTANEOUS
  Administered 2022-04-30: 11 [IU] via SUBCUTANEOUS
  Filled 2022-04-29 (×3): qty 1

## 2022-04-29 MED ORDER — INSULIN ASPART 100 UNIT/ML IJ SOLN
10.0000 [IU] | Freq: Once | INTRAMUSCULAR | Status: AC
  Administered 2022-04-29: 10 [IU] via SUBCUTANEOUS
  Filled 2022-04-29: qty 1

## 2022-04-29 MED ORDER — INSULIN ASPART 100 UNIT/ML IJ SOLN
0.0000 [IU] | Freq: Every day | INTRAMUSCULAR | Status: DC
  Administered 2022-04-29: 2 [IU] via SUBCUTANEOUS
  Filled 2022-04-29: qty 1

## 2022-04-29 MED ORDER — INSULIN ASPART 100 UNIT/ML IJ SOLN
6.0000 [IU] | Freq: Three times a day (TID) | INTRAMUSCULAR | Status: DC
Start: 2022-04-29 — End: 2022-04-30
  Administered 2022-04-29 – 2022-04-30 (×3): 6 [IU] via SUBCUTANEOUS
  Filled 2022-04-29 (×3): qty 1

## 2022-04-29 MED ORDER — METFORMIN HCL 500 MG PO TABS
1000.0000 mg | ORAL_TABLET | Freq: Two times a day (BID) | ORAL | Status: DC
  Administered 2022-04-29 – 2022-04-30 (×3): 1000 mg via ORAL
  Filled 2022-04-29 (×3): qty 2

## 2022-04-29 MED ORDER — LISINOPRIL 20 MG PO TABS
20.0000 mg | ORAL_TABLET | Freq: Every day | ORAL | Status: DC
  Administered 2022-04-29 – 2022-04-30 (×2): 20 mg via ORAL
  Filled 2022-04-29 (×2): qty 1

## 2022-04-29 NOTE — ED Notes (Signed)
VOL recommended inpatient

## 2022-04-29 NOTE — ED Notes (Signed)
Patient transferred from ED to [facility] room 8 after screening for contraband. Report received from Maudie Mercury, RN including Situation, Background, Assessment and Recommendations. Pt oriented to unit including Q15 minute rounds as well as the security cameras for their protection. Patient is alert and oriented, warm and dry in no acute distress. Patient denies HI, and AVH. Pt. Encouraged to let this nurse know if needs arise.

## 2022-04-29 NOTE — Consult Note (Signed)
[facility] Consult   Reason for Consult:Depression and Suicidal Referring Physician: Dr. Starleen Blue Patient Identification: Bryan Flowers MRN:  [id number] Principal Diagnosis: <principal problem not specified> Diagnosis:  Active Problems:   Back ache   Diabetes mellitus type 2 in obese (HCC)   Hearing loss, sensorineural, combined types   Anxiety   Fibromyalgia   Parkinson disease ([place])   Essential hypertension   Bipolar 1 disorder, manic, moderate (HCC)   Major depression, recurrent (HCC)   Grief   Disturbance of understanding   Noncompliance with medications   Total Time spent with patient: 1 hour  Subjective: "I am really depressed. I had a break-up." Bryan Flowers is a 61 y.o. male patient presented to [facility] via POV by self. The patient shared that he is depressed. He states he had a recent break-up. He also shared that his wife of 25 years passed away four years ago from dementia. He also discussed that he is approaching the fourth anniversary of her death in 2023/06/06. The patient shares that he has financial problems; his electricity was turned off today, and he is having difficulties paying other bills. He states he is on disability for his Parkinson's and bipolar diagnosis. The patient shared he is not sleeping and has been off his medication for 2-3 weeks. He shares that he can not afford to pay the co-pay. He states he has a car, and he also has to spend money on the upkeep.  This provider saw The patient face-to-face; the chart was reviewed, and consulted with Dr. Starleen Blue on 04/28/2022 due to the patient's care. It was discussed with both providers that the patient does meet the criteria to be admitted to the psychiatric inpatient unit.  On evaluation, the patient is alert and oriented x 4, calm, cooperative, and mood-congruent with affect. The patient does not appear to be responding to internal or external stimuli. Neither is the patient presenting with any  delusional thinking. The patient denies auditory or visual hallucinations. The patient admits to suicidal ideation but denies homicidal or self-harm ideations. The patient is not presenting with any psychotic or paranoid behaviors. During an encounter with the patient, he could answer questions appropriately.  HPI: Per Dr. Starleen Blue, Bryan Flowers is a 61 y.o. male with past medical history of hypertension hyperlipidemia Parkinson disease bipolar disorder depression presents with depression.  Patient has had severe depressive thoughts lately.  Has had thoughts of suicide but denies plan.  He it is his 4-year anniversary of his wife's death and he recently broke up with his girlfriend.  His power went out and he also cannot afford his medications.  Denies any medical issues.  Not taking drugs or alcohol.  Past Psychiatric History:  Insomnia Parkinson disease ([place]) Anxiety Bipolar 1 disorder, manic, moderate (HCC) Depression   Risk to Self:   Risk to Others:   Prior Inpatient Therapy:   Prior Outpatient Therapy:    Past Medical History:  Past Medical History:  Diagnosis Date   Anemia    iron deficiency   Anxiety    Bipolar 1 disorder, manic, moderate ([place])    Blood transfusion without reported diagnosis    due to GI bleeding-ulcers   Claudication ([place]) 07/16/2017   Depression    Diabetes mellitus without complication (HCC)    GERD (gastroesophageal reflux disease)    Hyperlipidemia    Hypertension    Insomnia    Parkinson disease ([place])    Peptic ulcer disease with hemorrhage  Sleep apnea    on cpap    Past Surgical History:  Procedure Laterality Date   ESOPHAGOGASTRODUODENOSCOPY Left 03/16/2016   Procedure: ESOPHAGOGASTRODUODENOSCOPY (EGD);  Surgeon: Manus Gunning, MD;  Location: [place];  Service: Gastroenterology;  Laterality: Left;   FOOT FRACTURE SURGERY Right    Family History:  Family History  Problem Relation Age of Onset   Cancer Mother    Heart  disease Father    Arthritis Brother    Colon polyps Neg Hx    Colon cancer Neg Hx    Esophageal cancer Neg Hx    Rectal cancer Neg Hx    Stomach cancer Neg Hx    Family Psychiatric  History:  Social History:  Social History   Substance and Sexual Activity  Alcohol Use No   Alcohol/week: 0.0 standard drinks of alcohol     Social History   Substance and Sexual Activity  Drug Use No    Social History   Socioeconomic History   Marital status: Widowed    Spouse name: Not on file   Number of children: 0   Years of education: Not on file   Highest education level: High school graduate  Occupational History    Employer: DISABLED  Tobacco Use   Smoking status: Never   Smokeless tobacco: Never   Tobacco comments:    Non-smoker  Vaping Use   Vaping Use: Never used  Substance and Sexual Activity   Alcohol use: No    Alcohol/week: 0.0 standard drinks of alcohol   Drug use: No   Sexual activity: Not Currently    Partners: Female  Other Topics Concern   Not on file  Social History Narrative   Pt lives alone.    Social Determinants of Health   Financial Resource Strain: Low Risk  (02/25/2021)   Overall Financial Resource Strain (CARDIA)    Difficulty of Paying Living Expenses: Not hard at all  Food Insecurity: No Food Insecurity (02/25/2021)   Hunger Vital Sign    Worried About Running Out of Food in the Last Year: Never true    Ran Out of Food in the Last Year: Never true  Transportation Needs: No Transportation Needs (02/25/2021)   PRAPARE - Hydrologist (Medical): No    Lack of Transportation (Non-Medical): No  Physical Activity: Sufficiently Active (02/25/2021)   Exercise Vital Sign    Days of Exercise per Week: 7 days    Minutes of Exercise per Session: 30 min  Stress: No Stress Concern Present (02/25/2021)   [place]    Feeling of Stress : Only a little  Social  Connections: Moderately Isolated (02/25/2021)   Social Connection and Isolation Panel [NHANES]    Frequency of Communication with Friends and Family: More than three times a week    Frequency of Social Gatherings with Friends and Family: More than three times a week    Attends Religious Services: More than 4 times per year    Active Member of [employer] or Organizations: No    Attends Archivist Meetings: Never    Marital Status: Widowed   Additional Social History:    Allergies:  No Known Allergies  Labs:  Results for orders placed or performed during the hospital encounter of 04/28/22 (from the past 48 hour(s))  Comprehensive metabolic panel     Status: Abnormal   Collection Time: 04/28/22  8:37 PM  Result Value Ref Range  Sodium 134 (L) 135 - 145 mmol/L   Potassium 5.6 (H) 3.5 - 5.1 mmol/L    Comment: HEMOLYSIS AT THIS LEVEL MAY AFFECT RESULT   Chloride 101 98 - 111 mmol/L   CO2 25 22 - 32 mmol/L   Glucose, Bld 311 (H) 70 - 99 mg/dL    Comment: Glucose reference range applies only to samples taken after fasting for at least 8 hours.   BUN 13 6 - 20 mg/dL   Creatinine, Ser 0.97 0.61 - 1.24 mg/dL   Calcium 9.5 8.9 - 10.3 mg/dL   Total Protein 7.7 6.5 - 8.1 g/dL   Albumin 4.5 3.5 - 5.0 g/dL   AST 17 15 - 41 U/L    Comment: HEMOLYSIS AT THIS LEVEL MAY AFFECT RESULT   ALT 18 0 - 44 U/L    Comment: HEMOLYSIS AT THIS LEVEL MAY AFFECT RESULT   Alkaline Phosphatase 65 38 - 126 U/L   Total Bilirubin 1.3 (H) 0.3 - 1.2 mg/dL    Comment: HEMOLYSIS AT THIS LEVEL MAY AFFECT RESULT   GFR, Estimated >60 >60 mL/min    Comment: (NOTE) Calculated using the CKD-EPI Creatinine Equation (2021)    Anion gap 8 5 - 15    Comment: Performed at [facility], [address] [place], [place] [address]  Ethanol     Status: None   Collection Time: 04/28/22  8:37 PM  Result Value Ref Range   Alcohol, Ethyl (B) <10 <10 mg/dL    Comment: (NOTE) Lowest detectable limit for serum  alcohol is 10 mg/dL.  For medical purposes only. Performed at [facility], [address] [place], [place] [address]   Salicylate level     Status: Abnormal   Collection Time: 04/28/22  8:37 PM  Result Value Ref Range   Salicylate Lvl <7.1 (L) 7.0 - 30.0 mg/dL    Comment: Performed at [facility], [address] [place], [place] [address]  Acetaminophen level     Status: Abnormal   Collection Time: 04/28/22  8:37 PM  Result Value Ref Range   Acetaminophen (Tylenol), Serum <10 (L) 10 - 30 ug/mL    Comment: (NOTE) Therapeutic concentrations vary significantly. A range of 10-30 ug/mL  may be an effective concentration for many patients. However, some  are best treated at concentrations outside of this range. Acetaminophen concentrations >150 ug/mL at 4 hours after ingestion  and >50 ug/mL at 12 hours after ingestion are often associated with  toxic reactions.  Performed at [facility], [address] [place],  [address]   cbc     Status: Abnormal   Collection Time: 04/28/22  8:37 PM  Result Value Ref Range   WBC 6.5 4.0 - 10.5 K/uL   RBC 6.07 (H) 4.22 - 5.81 MIL/uL   Hemoglobin 17.5 (H) 13.0 - 17.0 g/dL   HCT 52.4 (H) 39.0 - 52.0 %   MCV 86.3 80.0 - 100.0 fL   MCH 28.8 26.0 - 34.0 pg   MCHC 33.4 30.0 - 36.0 g/dL   RDW 12.5 11.5 - 15.5 %   Platelets 301 150 - 400 K/uL   nRBC 0.0 0.0 - 0.2 %    Comment: Performed at [facility], [address] [place],  [address]  Resp Panel by RT-PCR (Flu A&B, Covid) Anterior Nasal Swab     Status: None   Collection Time: 04/28/22 10:10 PM   Specimen: Anterior Nasal Swab  Result Value Ref Range  SARS Coronavirus 2 by RT PCR NEGATIVE NEGATIVE    Comment: (NOTE) SARS-CoV-2 target nucleic acids are NOT DETECTED.  The SARS-CoV-2 RNA is generally detectable in upper respiratory specimens during the acute phase of infection. The lowest concentration of SARS-CoV-2 viral copies this  assay can detect is 138 copies/mL. A negative result does not preclude SARS-Cov-2 infection and should not be used as the sole basis for treatment or other patient management decisions. A negative result may occur with  improper specimen collection/handling, submission of specimen other than nasopharyngeal swab, presence of viral mutation(s) within the areas targeted by this assay, and inadequate number of viral copies(<138 copies/mL). A negative result must be combined with clinical observations, patient history, and epidemiological information. The expected result is Negative.  Fact Sheet for Patients:  EntrepreneurPulse.com.au  Fact Sheet for Healthcare Providers:  IncredibleEmployment.be  This test is no t yet approved or cleared by the [place] FDA and  has been authorized for detection and/or diagnosis of SARS-CoV-2 by FDA under an Emergency Use Authorization (EUA). This EUA will remain  in effect (meaning this test can be used) for the duration of the COVID-19 declaration under Section 564(b)(1) of the Act, 21 U.S.C.section 360bbb-3(b)(1), unless the authorization is terminated  or revoked sooner.       Influenza A by PCR NEGATIVE NEGATIVE   Influenza B by PCR NEGATIVE NEGATIVE    Comment: (NOTE) The Xpert Xpress SARS-CoV-2/FLU/RSV plus assay is intended as an aid in the diagnosis of influenza from Nasopharyngeal swab specimens and should not be used as a sole basis for treatment. Nasal washings and aspirates are unacceptable for Xpert Xpress SARS-CoV-2/FLU/RSV testing.  Fact Sheet for Patients: EntrepreneurPulse.com.au  Fact Sheet for Healthcare Providers: IncredibleEmployment.be  This test is not yet approved or cleared by the [place] FDA and has been authorized for detection and/or diagnosis of SARS-CoV-2 by FDA under an Emergency Use Authorization (EUA). This EUA will remain in  effect (meaning this test can be used) for the duration of the COVID-19 declaration under Section 564(b)(1) of the Act, 21 U.S.C. section 360bbb-3(b)(1), unless the authorization is terminated or revoked.  Performed at [facility], [address] [place], [place] [address]     No current facility-administered medications for this encounter.   Current Outpatient Medications  Medication Sig Dispense Refill   acetaminophen (TYLENOL) 500 MG tablet Take 1,000 mg by mouth every 6 (six) hours as needed (pain).     amLODipine (NORVASC) 5 MG tablet Take 1 tablet (5 mg total) by mouth daily. 90 tablet 3   Armodafinil 250 MG tablet Take by mouth in the morning.      b complex vitamins capsule Take 1 capsule by mouth daily.     carbidopa-levodopa (SINEMET CR) 50-200 MG per tablet Take 1 tablet by mouth 3 (three) times daily.      cholecalciferol (VITAMIN D3) 25 MCG (1000 UT) tablet Take 1,000 Units by mouth daily.     DULoxetine (CYMBALTA) 60 MG capsule Take 120 mg by mouth at bedtime.     glucose blood (ONETOUCH VERIO) test strip DX:E11.69, LON:99 Check TID 100 each 12   glucose blood test strip Check once daily fasting, and up to 3 times a day as needed. 100 each 12   insulin degludec (TRESIBA FLEXTOUCH) 200 UNIT/ML FlexTouch Pen Inject 30-50 Units into the skin every evening. 15 mL 11   Insulin Pen Needle (NOVOFINE) 32G X 6 MM MISC 1 each by Does not apply route  daily. 100 each 3   lamoTRIgine (LAMICTAL) 200 MG tablet Take 400 mg by mouth at bedtime.      LORazepam (ATIVAN) 1 MG tablet Typically takes  1 to 2 times daily     metFORMIN (GLUCOPHAGE) 1000 MG tablet Take 1 tablet (1,000 mg total) by mouth 2 (two) times daily with a meal. 180 tablet 3   pregabalin (LYRICA) 50 MG capsule Take 1 capsule (50 mg total) by mouth 2 (two) times daily. 60 capsule 12   propranolol ER (INDERAL LA) 60 MG 24 hr capsule Take 60 mg by mouth at bedtime.      QUEtiapine (SEROQUEL) 300 MG tablet Take 300  mg by mouth at bedtime.     quinapril (ACCUPRIL) 20 MG tablet Take 1 tablet (20 mg total) by mouth at bedtime. 90 tablet 1   rOPINIRole (REQUIP) 2 MG tablet Take 2 mg by mouth 2 (two) times daily.     rosuvastatin (CRESTOR) 40 MG tablet Take 1 tablet (40 mg total) by mouth at bedtime. 90 tablet 1   RYBELSUS 7 MG TABS TAKE 1 TABLET BY MOUTH DAILY 30 tablet 1    Musculoskeletal: Strength & Muscle Tone: within normal limits Gait & Station: normal Patient leans: N/A Psychiatric Specialty Exam:  Presentation  General Appearance: No data recorded Eye Contact:No data recorded Speech:No data recorded Speech Volume:No data recorded Handedness:No data recorded  Mood and Affect  Mood:Depressed  Affect:Flat; Blunt   Thought Process  Thought Processes:Coherent  Descriptions of Associations:Intact  Orientation:Full (Time, Place and Person)  Thought Content:Logical  History of Schizophrenia/Schizoaffective disorder:No  Duration of Psychotic Symptoms:No data recorded Hallucinations:Hallucinations: None  Ideas of Reference:None  Suicidal Thoughts:Suicidal Thoughts: Yes, Passive SI Passive Intent and/or Plan: Without Intent; Without Plan  Homicidal Thoughts:Homicidal Thoughts: No   Sensorium  Memory:Immediate Good; Recent Good; Remote Good  Judgment:Fair  Insight:Fair   Executive Functions  Concentration:Fair  Attention Span:Fair  Recall:Good  Fund of Knowledge:Good  Language:Good   Psychomotor Activity  Psychomotor Activity:Psychomotor Activity: Normal   Assets  Assets:Communication Skills; Desire for Improvement; Housing; Leisure Time; Physical Health; Resilience; Social Support   Sleep  Sleep:Sleep: Fair   Physical Exam: Physical Exam Vitals and nursing note reviewed.  Constitutional:      Appearance: Normal appearance.  HENT:     Head: Normocephalic and atraumatic.     Right Ear: External ear normal.     Left Ear: External ear normal.      Nose: Nose normal.     Mouth/Throat:     Mouth: Mucous membranes are moist.  Cardiovascular:     Rate and Rhythm: Normal rate.     Pulses: Normal pulses.  Pulmonary:     Effort: Pulmonary effort is normal.  Musculoskeletal:        General: Normal range of motion.     Cervical back: Normal range of motion and neck supple.  Neurological:     Mental Status: He is alert and oriented to person, place, and time. Mental status is at baseline.  Psychiatric:        Attention and Perception: Attention and perception normal.        Mood and Affect: Affect is blunt and flat.        Speech: Speech normal.        Behavior: Behavior normal. Behavior is cooperative.        Thought Content: Thought content includes suicidal ideation.        Cognition and Memory: Cognition normal.  Judgment: Judgment is inappropriate.    Review of Systems  Psychiatric/Behavioral:  Positive for depression and suicidal ideas. The patient is nervous/anxious and has insomnia.   All other systems reviewed and are negative.  Blood pressure (!) 169/100, pulse 80, temperature 98.1 F (36.7 C), temperature source Oral, resp. rate 17, height '5\' 11"'$  (1.803 m), weight 83.9 kg, SpO2 100 %. Body mass index is 25.8 kg/m.  Treatment Plan Summary: Plan Patient does meet the criteria for psychiatric inpatient admission  Disposition: Recommend psychiatric Inpatient admission when medically cleared. Supportive therapy provided about ongoing stressors. Patient does meet the criteria for psychiatric inpatient admission  Caroline Sauger, NP 04/29/2022 1:03 AM

## 2022-04-29 NOTE — ED Notes (Signed)
VOL/ RECOMMEND [facility] IMPATIENT

## 2022-04-29 NOTE — ED Notes (Signed)
Patient started on b/p medications, pharmacy reconciled medications, order for acu checks per Doctor Joni Fears. Patient denies discomfort, no complaints, will continue to monitor.

## 2022-04-29 NOTE — ED Notes (Signed)
Bryan Flowers ate 100% of supper and beverage, Bryan Flowers's blood sugar remains high, nurse talked with Dr Archie Balboa and He said to give the max of the sliding scale and the scheduled 6 units and recheck again. Bryan Flowers is without complaints.

## 2022-04-29 NOTE — ED Provider Notes (Signed)
Emergency Medicine Observation Re-evaluation Note  Bryan Flowers is a 61 y.o. male, seen on rounds today.  Pt initially presented to the ED for complaints of Depression and Suicidal  Currently, the patient is calm, no acute complaints.  Physical Exam  Blood pressure (!) 170/97, pulse 84, temperature 98.2 F (36.8 C), temperature source Oral, resp. rate 18, height '5\' 11"'$  (1.803 m), weight 83.9 kg, SpO2 100 %. Physical Exam General: NAD Lungs: CTAB Psych: not agitated  ED Course / MDM  EKG:    I have reviewed the labs performed to date as well as medications administered while in observation.  Recent changes in the last 24 hours include no acute events overnight.    Plan  Current plan is for inpatient psych treatment. Patient is not under full IVC at this time.   Carrie Mew, MD 04/29/22 1124

## 2022-04-29 NOTE — ED Notes (Signed)
Patient sitting in the dayroom, no signs of distress, denies SI, states I came here to get help before I got to the place of wanting to hurt myself, Patient states that he has went thru so much over the last few years, talked about the loss of His wife and how she had gotten sick with dementia and that He and she both were put in a assisted living home and in order to get there for help  He lost His house and property , He states she passed away 18 months after they moved there and then He has been struggling every since financially. He just recently lost His girlfriend that decided she didn't want to be with him anymore and now He has been depressed and feels like giving up, Nurse listened and showed empathy. Patient is pleasant and cooperative.

## 2022-04-29 NOTE — BH Assessment (Signed)
Patient being reviewed for admission to [facility] pending medical stabilization.

## 2022-04-29 NOTE — ED Notes (Signed)
Pt given snack. 

## 2022-04-30 ENCOUNTER — Inpatient Hospital Stay
Admission: AD | Admit: 2022-04-30 | Discharge: 2022-05-08 | DRG: 885 | Disposition: A | Discharge status: Home / Self Care | Payer: PPO | Source: Intra-hospital | Attending: Psychiatry | Admitting: Psychiatry

## 2022-04-30 ENCOUNTER — Encounter: Payer: Self-pay | Admitting: Psychiatry

## 2022-04-30 ENCOUNTER — Other Ambulatory Visit: Payer: Self-pay

## 2022-04-30 DIAGNOSIS — I10 Essential (primary) hypertension: Secondary | ICD-10-CM | POA: Diagnosis not present

## 2022-04-30 DIAGNOSIS — F419 Anxiety disorder, unspecified: Secondary | ICD-10-CM | POA: Diagnosis not present

## 2022-04-30 DIAGNOSIS — E1151 Type 2 diabetes mellitus with diabetic peripheral angiopathy without gangrene: Secondary | ICD-10-CM | POA: Diagnosis present

## 2022-04-30 DIAGNOSIS — E785 Hyperlipidemia, unspecified: Secondary | ICD-10-CM | POA: Diagnosis present

## 2022-04-30 DIAGNOSIS — Z7984 Long term (current) use of oral hypoglycemic drugs: Secondary | ICD-10-CM

## 2022-04-30 DIAGNOSIS — R45851 Suicidal ideations: Secondary | ICD-10-CM | POA: Diagnosis not present

## 2022-04-30 DIAGNOSIS — Z8249 Family history of ischemic heart disease and other diseases of the circulatory system: Secondary | ICD-10-CM | POA: Diagnosis not present

## 2022-04-30 DIAGNOSIS — K219 Gastro-esophageal reflux disease without esophagitis: Secondary | ICD-10-CM | POA: Diagnosis present

## 2022-04-30 DIAGNOSIS — Z20822 Contact with and (suspected) exposure to covid-19: Secondary | ICD-10-CM | POA: Diagnosis present

## 2022-04-30 DIAGNOSIS — F333 Major depressive disorder, recurrent, severe with psychotic symptoms: Secondary | ICD-10-CM | POA: Diagnosis not present

## 2022-04-30 DIAGNOSIS — I959 Hypotension, unspecified: Secondary | ICD-10-CM | POA: Diagnosis present

## 2022-04-30 DIAGNOSIS — R778 Other specified abnormalities of plasma proteins: Secondary | ICD-10-CM | POA: Diagnosis not present

## 2022-04-30 DIAGNOSIS — Z794 Long term (current) use of insulin: Secondary | ICD-10-CM | POA: Diagnosis not present

## 2022-04-30 DIAGNOSIS — Z5986 Financial insecurity: Secondary | ICD-10-CM

## 2022-04-30 DIAGNOSIS — G20 Parkinson's disease: Secondary | ICD-10-CM | POA: Diagnosis present

## 2022-04-30 DIAGNOSIS — Z79899 Other long term (current) drug therapy: Secondary | ICD-10-CM

## 2022-04-30 DIAGNOSIS — F329 Major depressive disorder, single episode, unspecified: Secondary | ICD-10-CM | POA: Diagnosis present

## 2022-04-30 DIAGNOSIS — E1165 Type 2 diabetes mellitus with hyperglycemia: Secondary | ICD-10-CM | POA: Diagnosis present

## 2022-04-30 DIAGNOSIS — R4182 Altered mental status, unspecified: Secondary | ICD-10-CM | POA: Diagnosis not present

## 2022-04-30 DIAGNOSIS — F331 Major depressive disorder, recurrent, moderate: Secondary | ICD-10-CM | POA: Diagnosis not present

## 2022-04-30 DIAGNOSIS — G4700 Insomnia, unspecified: Secondary | ICD-10-CM | POA: Diagnosis present

## 2022-04-30 DIAGNOSIS — F339 Major depressive disorder, recurrent, unspecified: Secondary | ICD-10-CM | POA: Diagnosis not present

## 2022-04-30 DIAGNOSIS — G4730 Sleep apnea, unspecified: Secondary | ICD-10-CM | POA: Diagnosis present

## 2022-04-30 DIAGNOSIS — Z136 Encounter for screening for cardiovascular disorders: Secondary | ICD-10-CM | POA: Diagnosis not present

## 2022-04-30 LAB — CBG MONITORING, ED
Glucose-Capillary: 277 mg/dL — ABNORMAL HIGH (ref 70–99)
Glucose-Capillary: 318 mg/dL — ABNORMAL HIGH (ref 70–99)
Glucose-Capillary: 114 mg/dL — ABNORMAL HIGH (ref 70–99)

## 2022-04-30 MED ORDER — AMLODIPINE BESYLATE 5 MG PO TABS
5.0000 mg | ORAL_TABLET | ORAL | Status: AC
  Administered 2022-04-30: 5 mg via ORAL
  Filled 2022-04-30: qty 1

## 2022-04-30 MED ORDER — INSULIN ASPART 100 UNIT/ML IJ SOLN
0.0000 [IU] | Freq: Three times a day (TID) | INTRAMUSCULAR | Status: DC
  Administered 2022-05-01: 20 [IU] via SUBCUTANEOUS
  Administered 2022-05-01: 7 [IU] via SUBCUTANEOUS
  Filled 2022-04-30: qty 1

## 2022-04-30 MED ORDER — MAGNESIUM HYDROXIDE 400 MG/5ML PO SUSP: 30.0000 mL | Freq: Every day | ORAL | Status: DC | PRN

## 2022-04-30 MED ORDER — ALUM & MAG HYDROXIDE-SIMETH 200-200-20 MG/5ML PO SUSP: 30.0000 mL | ORAL | Status: DC | PRN

## 2022-04-30 MED ORDER — LORAZEPAM 1 MG PO TABS
1.0000 mg | ORAL_TABLET | Freq: Every day | ORAL | Status: DC
Start: 2022-04-30 — End: 2022-04-30

## 2022-04-30 MED ORDER — ACETAMINOPHEN 325 MG PO TABS: 650.0000 mg | ORAL_TABLET | Freq: Four times a day (QID) | ORAL | Status: DC | PRN

## 2022-04-30 MED ORDER — AMLODIPINE BESYLATE 5 MG PO TABS: 10.0000 mg | ORAL_TABLET | Freq: Every day | ORAL | Status: DC

## 2022-04-30 MED ORDER — ACETAMINOPHEN 325 MG PO TABS: 650.0000 mg | ORAL_TABLET | Freq: Once | ORAL | Status: AC

## 2022-04-30 MED ORDER — ACETAMINOPHEN 325 MG PO TABS
ORAL_TABLET | ORAL | Status: AC
  Administered 2022-04-30: 650 mg via ORAL
  Filled 2022-04-30: qty 2

## 2022-04-30 NOTE — ED Notes (Signed)
VOL  PENDING  PLACEMENT 

## 2022-04-30 NOTE — Plan of Care (Signed)
  Problem: Education: Goal: Knowledge of General Education information will improve Description: Including pain rating scale, medication(s)/side effects and non-pharmacologic comfort measures Outcome: Progressing   Problem: Health Behavior/Discharge Planning: Goal: Ability to manage health-related needs will improve Outcome: Progressing   Problem: Clinical Measurements: Goal: Ability to maintain clinical measurements within normal limits will improve Outcome: Progressing Goal: Will remain free from infection Outcome: Progressing Goal: Diagnostic test results will improve Outcome: Progressing Goal: Respiratory complications will improve Outcome: Progressing Goal: Cardiovascular complication will be avoided Outcome: Progressing   Problem: Activity: Goal: Risk for activity intolerance will decrease Outcome: Progressing   Problem: Nutrition: Goal: Adequate nutrition will be maintained Outcome: Progressing   Problem: Coping: Goal: Level of anxiety will decrease Outcome: Progressing   Problem: Elimination: Goal: Will not experience complications related to bowel motility Outcome: Progressing Goal: Will not experience complications related to urinary retention Outcome: Progressing   Problem: Pain Managment: Goal: General experience of comfort will improve Outcome: Progressing   Problem: Safety: Goal: Ability to remain free from injury will improve Outcome: Progressing   Problem: Skin Integrity: Goal: Risk for impaired skin integrity will decrease Outcome: Progressing   Problem: Education: Goal: Knowledge of [place] General Education information/materials will improve Outcome: Progressing Goal: Emotional status will improve Outcome: Progressing Goal: Mental status will improve Outcome: Progressing Goal: Verbalization of understanding the information provided will improve Outcome: Progressing   Problem: Activity: Goal: Interest or engagement in activities will  improve Outcome: Progressing Goal: Sleeping patterns will improve Outcome: Progressing   Problem: Coping: Goal: Ability to verbalize frustrations and anger appropriately will improve Outcome: Progressing Goal: Ability to demonstrate self-control will improve Outcome: Progressing   Problem: Health Behavior/Discharge Planning: Goal: Identification of resources available to assist in meeting health care needs will improve Outcome: Progressing Goal: Compliance with treatment plan for underlying cause of condition will improve Outcome: Progressing   Problem: Physical Regulation: Goal: Ability to maintain clinical measurements within normal limits will improve Outcome: Progressing   Problem: Safety: Goal: Periods of time without injury will increase Outcome: Progressing   Problem: Education: Goal: Utilization of techniques to improve thought processes will improve Outcome: Progressing Goal: Knowledge of the prescribed therapeutic regimen will improve Outcome: Progressing   Problem: Activity: Goal: Interest or engagement in leisure activities will improve Outcome: Progressing Goal: Imbalance in normal sleep/wake cycle will improve Outcome: Progressing   Problem: Coping: Goal: Coping ability will improve Outcome: Progressing Goal: Will verbalize feelings Outcome: Progressing   Problem: Health Behavior/Discharge Planning: Goal: Ability to make decisions will improve Outcome: Progressing Goal: Compliance with therapeutic regimen will improve Outcome: Progressing   Problem: Role Relationship: Goal: Will demonstrate positive changes in social behaviors and relationships Outcome: Progressing   Problem: Safety: Goal: Ability to disclose and discuss suicidal ideas will improve Outcome: Progressing Goal: Ability to identify and utilize support systems that promote safety will improve Outcome: Progressing   Problem: Self-Concept: Goal: Will verbalize positive feelings about  self Outcome: Progressing Goal: Level of anxiety will decrease Outcome: Progressing   Problem: Coping: Goal: Ability to identify and develop effective coping behavior will improve Outcome: Progressing   Problem: Education: Goal: Ability to state activities that reduce stress will improve Outcome: Progressing   Problem: Self-Concept: Goal: Ability to identify factors that promote anxiety will improve Outcome: Progressing Goal: Level of anxiety will decrease Outcome: Progressing Goal: Ability to modify response to factors that promote anxiety will improve Outcome: Progressing

## 2022-04-30 NOTE — Consult Note (Signed)
[facility] Psych ED Progress Note  04/30/2022 5:03 PM Bryan Flowers  MRN:  [id number]   Method of visit?: Face to Face   Subjective: "I do feel some better."   Patient seen for re-assessment as he is waiting for blood glucose to better stabilize so that he can go to inpatient unit. Patient describes continued depression but states that he is feeling some better after getting away from his house and being around people. He is having a difficult time sleeping and is having a hard time waiting in the ED for a bed. Patient reports he takes ativan at night for sleep. Write ordered one-time dose of ativan at bedtime tonight.  Patient reports that he is not currently suicidal, but he thinks admission to get back on his medications would be helpful. Patient reports a history of bipolar disorder. He states the anniversary of his wife's death is 06/05/2023 and he had a broken engagement with other life stressors recently. Patient is voluntary and is agreeable to admission if he can obtain an inpatient bed tomorrow.  Patient remains pleasant, with coherent, linear sentences. He is relatively calm, reading a book.  denies auditory or visual hallucinations.     Principal Problem: Major depression, recurrent ([place]) Diagnosis:  Principal Problem:   Major depression, recurrent ([place]) Active Problems:   Back ache   Diabetes mellitus type 2 in obese (HCC)   Hearing loss, sensorineural, combined types   Anxiety   Fibromyalgia   Parkinson disease ([place])   Essential hypertension   Bipolar 1 disorder, manic, moderate (HCC)   Grief   Disturbance of understanding   Noncompliance with medications  Total Time spent with patient: 20 minutes  Past Psychiatric History: see previous  Past Medical History:  Past Medical History:  Diagnosis Date   Anemia    iron deficiency   Anxiety    Bipolar 1 disorder, manic, moderate (HCC)    Blood transfusion without reported diagnosis    due to GI bleeding-ulcers    Claudication ([place]) 07/16/2017   Depression    Diabetes mellitus without complication (HCC)    GERD (gastroesophageal reflux disease)    Hyperlipidemia    Hypertension    Insomnia    Parkinson disease (HCC)    Peptic ulcer disease with hemorrhage    Sleep apnea    on cpap    Past Surgical History:  Procedure Laterality Date   ESOPHAGOGASTRODUODENOSCOPY Left 03/16/2016   Procedure: ESOPHAGOGASTRODUODENOSCOPY (EGD);  Surgeon: Manus Gunning, MD;  Location: [place];  Service: Gastroenterology;  Laterality: Left;   FOOT FRACTURE SURGERY Right    Family History:  Family History  Problem Relation Age of Onset   Cancer Mother    Heart disease Father    Arthritis Brother    Colon polyps Neg Hx    Colon cancer Neg Hx    Esophageal cancer Neg Hx    Rectal cancer Neg Hx    Stomach cancer Neg Hx    Family Psychiatric  History:  Social History:  Social History   Substance and Sexual Activity  Alcohol Use No   Alcohol/week: 0.0 standard drinks of alcohol     Social History   Substance and Sexual Activity  Drug Use No    Social History   Socioeconomic History   Marital status: Widowed    Spouse name: Not on file   Number of children: 0   Years of education: Not on file   Highest education level: High school graduate  Occupational History    Employer: DISABLED  Tobacco Use   Smoking status: Never   Smokeless tobacco: Never   Tobacco comments:    Non-smoker  Vaping Use   Vaping Use: Never used  Substance and Sexual Activity   Alcohol use: No    Alcohol/week: 0.0 standard drinks of alcohol   Drug use: No   Sexual activity: Not Currently    Partners: Female  Other Topics Concern   Not on file  Social History Narrative   Pt lives alone.    Social Determinants of Health   Financial Resource Strain: Low Risk  (02/25/2021)   Overall Financial Resource Strain (CARDIA)    Difficulty of Paying Living Expenses: Not hard at all  Food Insecurity: No Food  Insecurity (02/25/2021)   Hunger Vital Sign    Worried About Running Out of Food in the Last Year: Never true    Ran Out of Food in the Last Year: Never true  Transportation Needs: No Transportation Needs (02/25/2021)   PRAPARE - Hydrologist (Medical): No    Lack of Transportation (Non-Medical): No  Physical Activity: Sufficiently Active (02/25/2021)   Exercise Vital Sign    Days of Exercise per Week: 7 days    Minutes of Exercise per Session: 30 min  Stress: No Stress Concern Present (02/25/2021)   [place]    Feeling of Stress : Only a little  Social Connections: Moderately Isolated (02/25/2021)   Social Connection and Isolation Panel [NHANES]    Frequency of Communication with Friends and Family: More than three times a week    Frequency of Social Gatherings with Friends and Family: More than three times a week    Attends Religious Services: More than 4 times per year    Active Member of [employer] or Organizations: No    Attends Archivist Meetings: Never    Marital Status: Widowed    Sleep: Poor  Appetite:  Good  Current Medications: Current Facility-Administered Medications  Medication Dose Route Frequency Provider Last Rate Last Admin   [START ON 05/01/2022] amLODipine (NORVASC) tablet 10 mg  10 mg Oral Daily Carrie Mew, MD       insulin aspart (novoLOG) injection 0-20 Units  0-20 Units Subcutaneous TID WC Carrie Mew, MD   15 Units at 04/30/22 1229   insulin aspart (novoLOG) injection 0-5 Units  0-5 Units Subcutaneous QHS Carrie Mew, MD   2 Units at 04/29/22 2144   insulin aspart (novoLOG) injection 6 Units  6 Units Subcutaneous TID WC Carrie Mew, MD   6 Units at 04/30/22 1229   lisinopril (ZESTRIL) tablet 20 mg  20 mg Oral Daily Carrie Mew, MD   20 mg at 04/30/22 0950   LORazepam (ATIVAN) tablet 1 mg  1 mg Oral QHS Waldon Merl F, NP        metFORMIN (GLUCOPHAGE) tablet 1,000 mg  1,000 mg Oral BID WC Carrie Mew, MD   1,000 mg at 04/30/22 1632   Current Outpatient Medications  Medication Sig Dispense Refill   acetaminophen (TYLENOL) 500 MG tablet Take 1,000 mg by mouth every 6 (six) hours as needed (pain).     amLODipine (NORVASC) 5 MG tablet Take 1 tablet (5 mg total) by mouth daily. (Patient not taking: Reported on 04/29/2022) 90 tablet 3   Armodafinil 250 MG tablet Take by mouth in the morning.  (Patient not taking: Reported on 04/29/2022)  b complex vitamins capsule Take 1 capsule by mouth daily. (Patient not taking: Reported on 04/29/2022)     carbidopa-levodopa (SINEMET CR) 50-200 MG per tablet Take 1 tablet by mouth 3 (three) times daily.  (Patient not taking: Reported on 04/29/2022)     cholecalciferol (VITAMIN D3) 25 MCG (1000 UT) tablet Take 1,000 Units by mouth daily.     DULoxetine (CYMBALTA) 60 MG capsule Take 120 mg by mouth at bedtime. (Patient not taking: Reported on 04/29/2022)     glucose blood (ONETOUCH VERIO) test strip DX:E11.69, LON:99 Check TID 100 each 12   glucose blood test strip Check once daily fasting, and up to 3 times a day as needed. 100 each 12   insulin degludec (TRESIBA FLEXTOUCH) 200 UNIT/ML FlexTouch Pen Inject 30-50 Units into the skin every evening. (Patient not taking: Reported on 04/29/2022) 15 mL 11   Insulin Pen Needle (NOVOFINE) 32G X 6 MM MISC 1 each by Does not apply route daily. 100 each 3   lamoTRIgine (LAMICTAL) 200 MG tablet Take 400 mg by mouth at bedtime.  (Patient not taking: Reported on 04/29/2022)     LORazepam (ATIVAN) 1 MG tablet Typically takes  1 to 2 times daily (Patient not taking: Reported on 04/29/2022)     metFORMIN (GLUCOPHAGE) 1000 MG tablet Take 1 tablet (1,000 mg total) by mouth 2 (two) times daily with a meal. (Patient not taking: Reported on 04/29/2022) 180 tablet 3   pregabalin (LYRICA) 50 MG capsule Take 1 capsule (50 mg total) by mouth 2 (two) times daily.  (Patient not taking: Reported on 04/29/2022) 60 capsule 12   propranolol ER (INDERAL LA) 60 MG 24 hr capsule Take 60 mg by mouth at bedtime.      QUEtiapine (SEROQUEL) 300 MG tablet Take 300 mg by mouth at bedtime.     quinapril (ACCUPRIL) 20 MG tablet Take 1 tablet (20 mg total) by mouth at bedtime. (Patient not taking: Reported on 04/29/2022) 90 tablet 1   rOPINIRole (REQUIP) 2 MG tablet Take 2 mg by mouth 2 (two) times daily.     rosuvastatin (CRESTOR) 40 MG tablet Take 1 tablet (40 mg total) by mouth at bedtime. (Patient not taking: Reported on 04/29/2022) 90 tablet 1   RYBELSUS 7 MG TABS TAKE 1 TABLET BY MOUTH DAILY (Patient not taking: Reported on 04/29/2022) 30 tablet 1   zolpidem (AMBIEN CR) 12.5 MG CR tablet Take 12.5 mg by mouth at bedtime.      Lab Results:  Results for orders placed or performed during the hospital encounter of 04/28/22 (from the past 48 hour(s))  Urine Drug Screen, Qualitative     Status: Abnormal   Collection Time: 04/28/22  7:41 PM  Result Value Ref Range   Tricyclic, Ur Screen NONE DETECTED NONE DETECTED   Amphetamines, Ur Screen NONE DETECTED NONE DETECTED   MDMA (Ecstasy)Ur Screen NONE DETECTED NONE DETECTED   Cocaine Metabolite,Ur [place] NONE DETECTED NONE DETECTED   Opiate, Ur Screen NONE DETECTED NONE DETECTED   Phencyclidine (PCP) Ur S NONE DETECTED NONE DETECTED   Cannabinoid 50 Ng, Ur [place] NONE DETECTED NONE DETECTED   Barbiturates, Ur Screen NONE DETECTED NONE DETECTED   Benzodiazepine, Ur Scrn POSITIVE (A) NONE DETECTED   Methadone Scn, Ur NONE DETECTED NONE DETECTED    Comment: (NOTE) Tricyclics + metabolites, urine    Cutoff 1000 ng/mL Amphetamines + metabolites, urine  Cutoff 1000 ng/mL MDMA (Ecstasy), urine  Cutoff 500 ng/mL Cocaine Metabolite, urine          Cutoff 300 ng/mL Opiate + metabolites, urine        Cutoff 300 ng/mL Phencyclidine (PCP), urine         Cutoff 25 ng/mL Cannabinoid, urine                 Cutoff 50  ng/mL Barbiturates + metabolites, urine  Cutoff 200 ng/mL Benzodiazepine, urine              Cutoff 200 ng/mL Methadone, urine                   Cutoff 300 ng/mL  The urine drug screen provides only a preliminary, unconfirmed analytical test result and should not be used for non-medical purposes. Clinical consideration and professional judgment should be applied to any positive drug screen result due to possible interfering substances. A more specific alternate chemical method must be used in order to obtain a confirmed analytical result. Gas chromatography / mass spectrometry (GC/MS) is the preferred confirm atory method. Performed at [facility], [address] [place], [place] [address]   Hemoglobin A1c     Status: Abnormal   Collection Time: 04/28/22  8:27 PM  Result Value Ref Range   Hgb A1c MFr Bld 12.1 (H) 4.8 - 5.6 %    Comment: (NOTE) Pre diabetes:          5.7%-6.4%  Diabetes:              >6.4%  Glycemic control for   <7.0% adults with diabetes    Mean Plasma Glucose 300.57 mg/dL    Comment: Performed at [facility] [address], [place], [place] [address]  Comprehensive metabolic panel     Status: Abnormal   Collection Time: 04/28/22  8:37 PM  Result Value Ref Range   Sodium 134 (L) 135 - 145 mmol/L   Potassium 5.6 (H) 3.5 - 5.1 mmol/L    Comment: HEMOLYSIS AT THIS LEVEL MAY AFFECT RESULT   Chloride 101 98 - 111 mmol/L   CO2 25 22 - 32 mmol/L   Glucose, Bld 311 (H) 70 - 99 mg/dL    Comment: Glucose reference range applies only to samples taken after fasting for at least 8 hours.   BUN 13 6 - 20 mg/dL   Creatinine, Ser 0.97 0.61 - 1.24 mg/dL   Calcium 9.5 8.9 - 10.3 mg/dL   Total Protein 7.7 6.5 - 8.1 g/dL   Albumin 4.5 3.5 - 5.0 g/dL   AST 17 15 - 41 U/L    Comment: HEMOLYSIS AT THIS LEVEL MAY AFFECT RESULT   ALT 18 0 - 44 U/L    Comment: HEMOLYSIS AT THIS LEVEL MAY AFFECT RESULT   Alkaline Phosphatase 65 38 - 126 U/L   Total  Bilirubin 1.3 (H) 0.3 - 1.2 mg/dL    Comment: HEMOLYSIS AT THIS LEVEL MAY AFFECT RESULT   GFR, Estimated >60 >60 mL/min    Comment: (NOTE) Calculated using the CKD-EPI Creatinine Equation (2021)    Anion gap 8 5 - 15    Comment: Performed at [facility], [address] [place], [place] [address]  Ethanol     Status: None   Collection Time: 04/28/22  8:37 PM  Result Value Ref Range   Alcohol, Ethyl (B) <10 <10 mg/dL    Comment: (NOTE) Lowest detectable limit for serum alcohol is 10 mg/dL.  For medical purposes  only. Performed at [facility], [address] [place], [place] [address]   Salicylate level     Status: Abnormal   Collection Time: 04/28/22  8:37 PM  Result Value Ref Range   Salicylate Lvl <7.2 (L) 7.0 - 30.0 mg/dL    Comment: Performed at [facility], [address] [place], [place] [address]  Acetaminophen level     Status: Abnormal   Collection Time: 04/28/22  8:37 PM  Result Value Ref Range   Acetaminophen (Tylenol), Serum <10 (L) 10 - 30 ug/mL    Comment: (NOTE) Therapeutic concentrations vary significantly. A range of 10-30 ug/mL  may be an effective concentration for many patients. However, some  are best treated at concentrations outside of this range. Acetaminophen concentrations >150 ug/mL at 4 hours after ingestion  and >50 ug/mL at 12 hours after ingestion are often associated with  toxic reactions.  Performed at [facility], [address] [place], [place] [address]   cbc     Status: Abnormal   Collection Time: 04/28/22  8:37 PM  Result Value Ref Range   WBC 6.5 4.0 - 10.5 K/uL   RBC 6.07 (H) 4.22 - 5.81 MIL/uL   Hemoglobin 17.5 (H) 13.0 - 17.0 g/dL   HCT 52.4 (H) 39.0 - 52.0 %   MCV 86.3 80.0 - 100.0 fL   MCH 28.8 26.0 - 34.0 pg   MCHC 33.4 30.0 - 36.0 g/dL   RDW 12.5 11.5 - 15.5 %   Platelets 301 150 - 400 K/uL   nRBC 0.0 0.0 - 0.2 %    Comment: Performed at [facility], [address] [place], [place] [address]  Resp Panel by RT-PCR (Flu A&B, Covid) Anterior Nasal Swab     Status: None   Collection Time: 04/28/22 10:10 PM   Specimen: Anterior Nasal Swab  Result Value Ref Range   SARS Coronavirus 2 by RT PCR NEGATIVE NEGATIVE    Comment: (NOTE) SARS-CoV-2 target nucleic acids are NOT DETECTED.  The SARS-CoV-2 RNA is generally detectable in upper respiratory specimens during the acute phase of infection. The lowest concentration of SARS-CoV-2 viral copies this assay can detect is 138 copies/mL. A negative result does not preclude SARS-Cov-2 infection and should not be used as the sole basis for treatment or other patient management decisions. A negative result may occur with  improper specimen collection/handling, submission of specimen other than nasopharyngeal swab, presence of viral mutation(s) within the areas targeted by this assay, and inadequate number of viral copies(<138 copies/mL). A negative result must be combined with clinical observations, patient history, and epidemiological information. The expected result is Negative.  Fact Sheet for Patients:  EntrepreneurPulse.com.au  Fact Sheet for Healthcare Providers:  IncredibleEmployment.be  This test is no t yet approved or cleared by the [place] FDA and  has been authorized for detection and/or diagnosis of SARS-CoV-2 by FDA under an Emergency Use Authorization (EUA). This EUA will remain  in effect (meaning this test can be used) for the duration of the COVID-19 declaration under Section 564(b)(1) of the Act, 21 U.S.C.section 360bbb-3(b)(1), unless the authorization is terminated  or revoked sooner.       Influenza A by PCR NEGATIVE NEGATIVE   Influenza B by PCR NEGATIVE NEGATIVE    Comment: (NOTE) The Xpert Xpress SARS-CoV-2/FLU/RSV plus assay is intended as an aid in the diagnosis of influenza from Nasopharyngeal swab specimens and should not be used  as a sole basis for treatment.  Nasal washings and aspirates are unacceptable for Xpert Xpress SARS-CoV-2/FLU/RSV testing.  Fact Sheet for Patients: EntrepreneurPulse.com.au  Fact Sheet for Healthcare Providers: IncredibleEmployment.be  This test is not yet approved or cleared by the [place] FDA and has been authorized for detection and/or diagnosis of SARS-CoV-2 by FDA under an Emergency Use Authorization (EUA). This EUA will remain in effect (meaning this test can be used) for the duration of the COVID-19 declaration under Section 564(b)(1) of the Act, 21 U.S.C. section 360bbb-3(b)(1), unless the authorization is terminated or revoked.  Performed at [facility], [address] [place], [place] [address]   CBG monitoring, ED     Status: Abnormal   Collection Time: 04/29/22  2:57 PM  Result Value Ref Range   Glucose-Capillary 522 (HH) 70 - 99 mg/dL    Comment: Glucose reference range applies only to samples taken after fasting for at least 8 hours.   Comment 1 Notify RN    Comment 2 Call MD NNP PA CNM   CBG monitoring, ED     Status: Abnormal   Collection Time: 04/29/22  4:33 PM  Result Value Ref Range   Glucose-Capillary 467 (H) 70 - 99 mg/dL    Comment: Glucose reference range applies only to samples taken after fasting for at least 8 hours.  CBG monitoring, ED     Status: Abnormal   Collection Time: 04/29/22  6:31 PM  Result Value Ref Range   Glucose-Capillary 262 (H) 70 - 99 mg/dL    Comment: Glucose reference range applies only to samples taken after fasting for at least 8 hours.  CBG monitoring, ED     Status: Abnormal   Collection Time: 04/29/22  8:27 PM  Result Value Ref Range   Glucose-Capillary 204 (H) 70 - 99 mg/dL    Comment: Glucose reference range applies only to samples taken after fasting for at least 8 hours.  CBG monitoring, ED     Status: Abnormal   Collection Time: 04/30/22  9:48 AM  Result Value Ref  Range   Glucose-Capillary 277 (H) 70 - 99 mg/dL    Comment: Glucose reference range applies only to samples taken after fasting for at least 8 hours.   Comment 1 Notify RN   CBG monitoring, ED     Status: Abnormal   Collection Time: 04/30/22 12:01 PM  Result Value Ref Range   Glucose-Capillary 318 (H) 70 - 99 mg/dL    Comment: Glucose reference range applies only to samples taken after fasting for at least 8 hours.  CBG monitoring, ED     Status: Abnormal   Collection Time: 04/30/22  4:21 PM  Result Value Ref Range   Glucose-Capillary 114 (H) 70 - 99 mg/dL    Comment: Glucose reference range applies only to samples taken after fasting for at least 8 hours.    Blood Alcohol level:  Lab Results  Component Value Date   ETH <10 04/28/2022   ETH <5 01/20/2017    Physical Findings: AIMS:  , ,  ,  ,    CIWA:    COWS:     Musculoskeletal: Strength & Muscle Tone: within normal limits Gait & Station: normal Patient leans: N/A  Psychiatric Specialty Exam:  Presentation  General Appearance: Appropriate for Environment Eye Contact:Good Speech:Clear and Coherent Speech Volume:Normal Handedness:No data recorded  Mood and Affect  Mood:Depressed  Affect:Congruent   Thought Process  Thought Processes:Coherent  Descriptions of Associations:Intact  Orientation:Full (Time, Place and Person)  Thought Content:WDL  History of Schizophrenia/Schizoaffective disorder:No  Duration of Psychotic Symptoms:No data recorded Hallucinations:Hallucinations: None  Ideas of Reference:None  Suicidal Thoughts:Suicidal Thoughts: No  Homicidal Thoughts:Homicidal Thoughts: No   Sensorium  Memory:Immediate Good; Recent Good; Remote Good  Judgment:Good  Insight:Good   Executive Functions  Concentration:Good  Attention Span:Good  [place] of Knowledge:Good  Language:Good   Psychomotor Activity  Psychomotor Activity:Psychomotor Activity: Normal  Assets   Assets:Communication Skills; Desire for Improvement; Financial Resources/Insurance; Housing; Resilience   Sleep  Sleep:Sleep: Poor   Physical Exam: Physical Exam Vitals and nursing note reviewed.  HENT:     Nose: No congestion or rhinorrhea.  Eyes:     General:        Right eye: No discharge.        Left eye: No discharge.  Cardiovascular:     Rate and Rhythm: Normal rate.  Pulmonary:     Effort: Pulmonary effort is normal.  Skin:    General: Skin is dry.  Neurological:     Mental Status: He is alert and oriented to person, place, and time.  Psychiatric:        Attention and Perception: Attention normal.        Mood and Affect: Mood is depressed.        Speech: Speech normal.        Behavior: Behavior is cooperative.        Thought Content: Thought content is not paranoid. Thought content does not include homicidal or suicidal ideation.        Cognition and Memory: Cognition normal.        Judgment: Judgment normal.    Review of Systems  Constitutional: Negative.   Respiratory: Negative.    Cardiovascular: Negative.   Psychiatric/Behavioral:  Positive for depression. Negative for hallucinations, memory loss, substance abuse and suicidal ideas. The patient has insomnia. The patient is not nervous/anxious.    Blood pressure (!) 155/105, pulse 85, temperature 97.7 F (36.5 C), temperature source Oral, resp. rate 17, height '5\' 11"'$  (1.803 m), weight 83.9 kg, SpO2 100 %. Body mass index is 25.8 kg/m.  Treatment Plan Summary: Plan Admit to inpatient psychiatry  Sherlon Handing, NP 04/30/2022, 5:03 PM

## 2022-04-30 NOTE — Plan of Care (Signed)
New admission  Problem: Education: Goal: Knowledge of General Education information will improve Description: Including pain rating scale, medication(s)/side effects and non-pharmacologic comfort measures 04/30/2022 1849 by Kieth Brightly, RN Outcome: Not Progressing 04/30/2022 1848 by Kieth Brightly, RN Outcome: Not Progressing 04/30/2022 1845 by Kieth Brightly, RN Outcome: Not Progressing   Problem: Health Behavior/Discharge Planning: Goal: Ability to manage health-related needs will improve 04/30/2022 1849 by Kieth Brightly, RN Outcome: Not Progressing 04/30/2022 1848 by Kieth Brightly, RN Outcome: Not Progressing 04/30/2022 1845 by Kieth Brightly, RN Outcome: Not Progressing   Problem: Clinical Measurements: Goal: Ability to maintain clinical measurements within normal limits will improve 04/30/2022 1849 by Kieth Brightly, RN Outcome: Not Progressing 04/30/2022 1848 by Kieth Brightly, RN Outcome: Not Progressing 04/30/2022 1845 by Kieth Brightly, RN Outcome: Not Progressing Goal: Will remain free from infection 04/30/2022 1849 by Kieth Brightly, RN Outcome: Not Progressing 04/30/2022 1848 by Kieth Brightly, RN Outcome: Not Progressing 04/30/2022 1845 by Kieth Brightly, RN Outcome: Not Progressing Goal: Diagnostic test results will improve 04/30/2022 1849 by Kieth Brightly, RN Outcome: Not Progressing 04/30/2022 1848 by Kieth Brightly, RN Outcome: Not Progressing 04/30/2022 1845 by Kieth Brightly, RN Outcome: Not Progressing Goal: Respiratory complications will improve 04/30/2022 1849 by Kieth Brightly, RN Outcome: Not Progressing 04/30/2022 1848 by Kieth Brightly, RN Outcome: Not Progressing 04/30/2022 1845 by Kieth Brightly, RN Outcome: Not Progressing Goal: Cardiovascular complication will be avoided 04/30/2022 1849 by Kieth Brightly, RN Outcome: Not Progressing 04/30/2022 1848 by Kieth Brightly, RN Outcome: Not Progressing 04/30/2022 1845 by Kieth Brightly, RN Outcome: Not Progressing   Problem: Activity: Goal: Risk for activity intolerance will decrease 04/30/2022 1849 by Kieth Brightly, RN Outcome: Not Progressing 04/30/2022 1848 by Kieth Brightly, RN Outcome: Not Progressing 04/30/2022 1845 by Kieth Brightly, RN Outcome: Not Progressing   Problem: Nutrition: Goal: Adequate nutrition will be maintained 04/30/2022 1849 by Kieth Brightly, RN Outcome: Not Progressing 04/30/2022 1848 by Kieth Brightly, RN Outcome: Not Progressing 04/30/2022 1845 by Kieth Brightly, RN Outcome: Not Progressing   Problem: Coping: Goal: Level of anxiety will decrease 04/30/2022 1849 by Kieth Brightly, RN Outcome: Not Progressing 04/30/2022 1848 by Kieth Brightly, RN Outcome: Not Progressing 04/30/2022 1845 by Kieth Brightly, RN Outcome: Not Progressing   Problem: Elimination: Goal: Will not experience complications related to bowel motility 04/30/2022 1849 by Kieth Brightly, RN Outcome: Not Progressing 04/30/2022 1848 by Kieth Brightly, RN Outcome: Not Progressing 04/30/2022 1845 by Kieth Brightly, RN Outcome: Not Progressing Goal: Will not experience complications related to urinary retention 04/30/2022 1849 by Kieth Brightly, RN Outcome: Not Progressing 04/30/2022 1848 by Kieth Brightly, RN Outcome: Not Progressing 04/30/2022 1845 by Kieth Brightly, RN Outcome: Not Progressing   Problem: Pain Managment: Goal: General experience of comfort will improve 04/30/2022 1849 by Kieth Brightly, RN Outcome: Not Progressing 04/30/2022 1848 by Kieth Brightly, RN Outcome: Not Progressing 04/30/2022 1845 by Kieth Brightly, RN Outcome: Not Progressing   Problem: Safety: Goal: Ability to remain free from injury will improve 04/30/2022 1849 by Kieth Brightly, RN Outcome: Not Progressing 04/30/2022 1848 by Kieth Brightly, RN Outcome: Not Progressing 04/30/2022 1845 by Kieth Brightly, RN Outcome: Not Progressing   Problem: Skin  Integrity: Goal: Risk for impaired skin integrity will decrease 04/30/2022 1849 by Kieth Brightly, RN Outcome: Not Progressing  04/30/2022 1848 by Kieth Brightly, RN Outcome: Not Progressing 04/30/2022 1845 by Kieth Brightly, RN Outcome: Not Progressing   Problem: Education: Goal: Knowledge of [place] Education information/materials will improve 04/30/2022 1849 by Kieth Brightly, RN Outcome: Not Progressing 04/30/2022 1848 by Kieth Brightly, RN Outcome: Not Progressing 04/30/2022 1845 by Kieth Brightly, RN Outcome: Not Progressing Goal: Emotional status will improve 04/30/2022 1849 by Kieth Brightly, RN Outcome: Not Progressing 04/30/2022 1848 by Kieth Brightly, RN Outcome: Not Progressing 04/30/2022 1845 by Kieth Brightly, RN Outcome: Not Progressing Goal: Mental status will improve 04/30/2022 1849 by Kieth Brightly, RN Outcome: Not Progressing 04/30/2022 1848 by Kieth Brightly, RN Outcome: Not Progressing 04/30/2022 1845 by Kieth Brightly, RN Outcome: Not Progressing Goal: Verbalization of understanding the information provided will improve 04/30/2022 1849 by Kieth Brightly, RN Outcome: Not Progressing 04/30/2022 1848 by Kieth Brightly, RN Outcome: Not Progressing 04/30/2022 1845 by Kieth Brightly, RN Outcome: Not Progressing   Problem: Activity: Goal: Interest or engagement in activities will improve 04/30/2022 1849 by Kieth Brightly, RN Outcome: Not Progressing 04/30/2022 1848 by Kieth Brightly, RN Outcome: Not Progressing 04/30/2022 1845 by Kieth Brightly, RN Outcome: Not Progressing Goal: Sleeping patterns will improve 04/30/2022 1849 by Kieth Brightly, RN Outcome: Not Progressing 04/30/2022 1848 by Kieth Brightly, RN Outcome: Not Progressing 04/30/2022 1845 by Kieth Brightly, RN Outcome: Not Progressing   Problem: Coping: Goal: Ability to verbalize frustrations and anger appropriately will improve 04/30/2022 1849 by Kieth Brightly,  RN Outcome: Not Progressing 04/30/2022 1848 by Kieth Brightly, RN Outcome: Not Progressing 04/30/2022 1845 by Kieth Brightly, RN Outcome: Not Progressing Goal: Ability to demonstrate self-control will improve 04/30/2022 1849 by Kieth Brightly, RN Outcome: Not Progressing 04/30/2022 1848 by Kieth Brightly, RN Outcome: Not Progressing 04/30/2022 1845 by Kieth Brightly, RN Outcome: Not Progressing   Problem: Health Behavior/Discharge Planning: Goal: Identification of resources available to assist in meeting health care needs will improve 04/30/2022 1849 by Kieth Brightly, RN Outcome: Not Progressing 04/30/2022 1848 by Kieth Brightly, RN Outcome: Not Progressing 04/30/2022 1845 by Kieth Brightly, RN Outcome: Not Progressing Goal: Compliance with treatment plan for underlying cause of condition will improve 04/30/2022 1849 by Kieth Brightly, RN Outcome: Not Progressing 04/30/2022 1848 by Kieth Brightly, RN Outcome: Not Progressing 04/30/2022 1845 by Kieth Brightly, RN Outcome: Not Progressing   Problem: Physical Regulation: Goal: Ability to maintain clinical measurements within normal limits will improve 04/30/2022 1849 by Kieth Brightly, RN Outcome: Not Progressing 04/30/2022 1848 by Kieth Brightly, RN Outcome: Not Progressing 04/30/2022 1845 by Kieth Brightly, RN Outcome: Not Progressing   Problem: Safety: Goal: Periods of time without injury will increase 04/30/2022 1849 by Kieth Brightly, RN Outcome: Not Progressing 04/30/2022 1848 by Kieth Brightly, RN Outcome: Not Progressing 04/30/2022 1845 by Kieth Brightly, RN Outcome: Not Progressing   Problem: Education: Goal: Utilization of techniques to improve thought processes will improve 04/30/2022 1849 by Kieth Brightly, RN Outcome: Not Progressing 04/30/2022 1848 by Kieth Brightly, RN Outcome: Not Progressing 04/30/2022 1845 by Kieth Brightly, RN Outcome: Not Progressing Goal: Knowledge of the prescribed  therapeutic regimen will improve 04/30/2022 1849 by Kieth Brightly, RN Outcome: Not Progressing 04/30/2022 1848 by Kieth Brightly, RN Outcome: Not Progressing 04/30/2022 1845 by Kieth Brightly, RN Outcome: Not Progressing   Problem: Activity:  Goal: Interest or engagement in leisure activities will improve 04/30/2022 1849 by Kieth Brightly, RN Outcome: Not Progressing 04/30/2022 1848 by Kieth Brightly, RN Outcome: Not Progressing 04/30/2022 1845 by Kieth Brightly, RN Outcome: Not Progressing Goal: Imbalance in normal sleep/wake cycle will improve 04/30/2022 1849 by Kieth Brightly, RN Outcome: Not Progressing 04/30/2022 1848 by Kieth Brightly, RN Outcome: Not Progressing 04/30/2022 1845 by Kieth Brightly, RN Outcome: Not Progressing   Problem: Coping: Goal: Coping ability will improve 04/30/2022 1849 by Kieth Brightly, RN Outcome: Not Progressing 04/30/2022 1848 by Kieth Brightly, RN Outcome: Not Progressing 04/30/2022 1845 by Kieth Brightly, RN Outcome: Not Progressing Goal: Will verbalize feelings 04/30/2022 1849 by Kieth Brightly, RN Outcome: Not Progressing 04/30/2022 1848 by Kieth Brightly, RN Outcome: Not Progressing 04/30/2022 1845 by Kieth Brightly, RN Outcome: Not Progressing   Problem: Health Behavior/Discharge Planning: Goal: Ability to make decisions will improve 04/30/2022 1849 by Kieth Brightly, RN Outcome: Not Progressing 04/30/2022 1848 by Kieth Brightly, RN Outcome: Not Progressing 04/30/2022 1845 by Kieth Brightly, RN Outcome: Not Progressing Goal: Compliance with therapeutic regimen will improve 04/30/2022 1849 by Kieth Brightly, RN Outcome: Not Progressing 04/30/2022 1848 by Kieth Brightly, RN Outcome: Not Progressing 04/30/2022 1845 by Kieth Brightly, RN Outcome: Not Progressing   Problem: Role Relationship: Goal: Will demonstrate positive changes in social behaviors and relationships 04/30/2022 1849 by Kieth Brightly, RN Outcome: Not  Progressing 04/30/2022 1848 by Kieth Brightly, RN Outcome: Not Progressing 04/30/2022 1845 by Kieth Brightly, RN Outcome: Not Progressing   Problem: Safety: Goal: Ability to disclose and discuss suicidal ideas will improve 04/30/2022 1849 by Kieth Brightly, RN Outcome: Not Progressing 04/30/2022 1848 by Kieth Brightly, RN Outcome: Not Progressing 04/30/2022 1845 by Kieth Brightly, RN Outcome: Not Progressing Goal: Ability to identify and utilize support systems that promote safety will improve 04/30/2022 1849 by Kieth Brightly, RN Outcome: Not Progressing 04/30/2022 1848 by Kieth Brightly, RN Outcome: Not Progressing 04/30/2022 1845 by Kieth Brightly, RN Outcome: Not Progressing   Problem: Self-Concept: Goal: Will verbalize positive feelings about self 04/30/2022 1849 by Kieth Brightly, RN Outcome: Not Progressing 04/30/2022 1848 by Kieth Brightly, RN Outcome: Not Progressing 04/30/2022 1845 by Kieth Brightly, RN Outcome: Not Progressing Goal: Level of anxiety will decrease 04/30/2022 1849 by Kieth Brightly, RN Outcome: Not Progressing 04/30/2022 1848 by Kieth Brightly, RN Outcome: Not Progressing 04/30/2022 1845 by Kieth Brightly, RN Outcome: Not Progressing   Problem: Education: Goal: Ability to state activities that reduce stress will improve Outcome: Not Progressing   Problem: Coping: Goal: Ability to identify and develop effective coping behavior will improve Outcome: Not Progressing   Problem: Self-Concept: Goal: Ability to identify factors that promote anxiety will improve Outcome: Not Progressing Goal: Level of anxiety will decrease Outcome: Not Progressing Goal: Ability to modify response to factors that promote anxiety will improve Outcome: Not Progressing

## 2022-04-30 NOTE — Tx Team (Signed)
Initial Treatment Plan 04/30/2022 6:47 PM Bryan Flowers WIO:[id number]    PATIENT STRESSORS: Financial difficulties   Health problems   Loss of relationships    Medication change or noncompliance     PATIENT STRENGTHS: Ability for insight  Average or above average intelligence  Capable of independent living    PATIENT IDENTIFIED PROBLEMS: Depression     Anxiety                 DISCHARGE CRITERIA:  Ability to meet basic life and health needs Improved stabilization in mood, thinking, and/or behavior Motivation to continue treatment in a less acute level of care Verbal commitment to aftercare and medication compliance  PRELIMINARY DISCHARGE PLAN: Attend aftercare/continuing care group Outpatient therapy Return to previous living arrangement  PATIENT/FAMILY INVOLVEMENT: This treatment plan has been presented to and reviewed with the patient, Bryan Flowers, and/or family member. The patienthas been given the opportunity to ask questions and make suggestions.  Kieth Brightly, RN 04/30/2022, 6:47 PM

## 2022-04-30 NOTE — BH Assessment (Signed)
Referral information for Psychiatric Hospitalization faxed to;   [facility] [phone]- [phone])  [facility] [phone]- [phone]),   [facility] [phone] -or- [phone])   [facility] [phone]),  [place] [phone], [phone], [phone] or [phone]),   [facility] [phone] or [phone])  [facility] [phone]),   [facility] [phone] -or- [phone]),   [facility] [phone])  [place] [phone] or [phone]),   [facility] [phone]).  [facility] [phone])

## 2022-04-30 NOTE — Progress Notes (Signed)
Patient ID: Bryan Flowers, male   DOB: Oct 21, 1960, 61 y.o.   MRN: [id number]  61 year old male admitted to [facility] floor to room #28. Pleasant, alert and oriented x4, denies current SI/HI/AVH. Denies any pain at present. Cooperative with admission assessment. Pt reports he has depression and anxiety, and he has a history of bipolar diagnosis. He has a PMH of HTN, diabetes, among other health conditions. States he was not compliant with his treatment regimen at home. When asked about abuse assessment pt declined to answer but states his wife could be volatile at times due to mental illness. Oriented to unit and to his room. Will continue q64mn checks and provide verbal redirection as needed.

## 2022-04-30 NOTE — ED Notes (Signed)
Pt given dinner tray and drink at this time. 

## 2022-04-30 NOTE — ED Notes (Signed)
Pt given lunch tray and water

## 2022-04-30 NOTE — Inpatient Diabetes Management (Signed)
Inpatient Diabetes Program Recommendations  AACE/ADA: New Consensus Statement on Inpatient Glycemic Control (2015)  Target Ranges:  Prepandial:   less than 140 mg/dL      Peak postprandial:   less than 180 mg/dL (1-2 hours)      Critically ill patients:  140 - 180 mg/dL   Lab Results  Component Value Date   GLUCAP 204 (H) 04/29/2022   HGBA1C 12.1 (H) 04/28/2022    Review of Glycemic Control  Latest Reference Range & Units 04/29/22 14:57 04/29/22 16:33 04/29/22 18:31 04/29/22 20:27  Glucose-Capillary 70 - 99 mg/dL 522 (HH) 467 (H) 262 (H) 204 (H)  (HH): Data is critically high (H): Data is abnormally high  Diabetes history: DM2 Outpatient Diabetes medications: Tresiba 30-50 unts QD, Metformin 1000 mg BID, Rybelsus 7 mg QD (not taking any meds for 2-3 months) Current orders for Inpatient glycemic control: Novolog 0-20 units TID and 0-5 units QHS, Novolog 6 units TID, Metformin 1000 mg BID  Inpatient Diabetes Program Recommendations:     Please consider:  -Carb modified diet -Semglee 15 units QD -Novolog 0-15 units TID and 0-5 QHS  Will continue to follow while inpatient.  Thank you, Reche Dixon, MSN, [place] [facility] Coordinator Inpatient Diabetes Program [phone] (team pager from 8a-5p)

## 2022-05-01 DIAGNOSIS — F333 Major depressive disorder, recurrent, severe with psychotic symptoms: Secondary | ICD-10-CM

## 2022-05-01 DIAGNOSIS — F329 Major depressive disorder, single episode, unspecified: Secondary | ICD-10-CM | POA: Diagnosis present

## 2022-05-01 LAB — GLUCOSE, CAPILLARY
Glucose-Capillary: 238 mg/dL — ABNORMAL HIGH (ref 70–99)
Glucose-Capillary: 359 mg/dL — ABNORMAL HIGH (ref 70–99)
Glucose-Capillary: 268 mg/dL — ABNORMAL HIGH (ref 70–99)
Glucose-Capillary: 237 mg/dL — ABNORMAL HIGH (ref 70–99)

## 2022-05-01 MED ORDER — CARBIDOPA-LEVODOPA ER 50-200 MG PO TBCR
1.0000 | EXTENDED_RELEASE_TABLET | Freq: Two times a day (BID) | ORAL | Status: DC
  Administered 2022-05-01 – 2022-05-08 (×15): 1 via ORAL
  Filled 2022-05-01 (×16): qty 1

## 2022-05-01 MED ORDER — INSULIN ASPART 100 UNIT/ML IJ SOLN
6.0000 [IU] | Freq: Three times a day (TID) | INTRAMUSCULAR | Status: DC
  Administered 2022-05-01 – 2022-05-08 (×23): 6 [IU] via SUBCUTANEOUS
  Filled 2022-05-01 (×13): qty 1

## 2022-05-01 MED ORDER — LAMOTRIGINE 100 MG PO TABS
400.0000 mg | ORAL_TABLET | Freq: Every day | ORAL | Status: DC
  Administered 2022-05-01 – 2022-05-03 (×3): 400 mg via ORAL
  Filled 2022-05-01 (×4): qty 4

## 2022-05-01 MED ORDER — LIVING WELL WITH DIABETES BOOK
Freq: Once | Status: AC
  Filled 2022-05-01: qty 1

## 2022-05-01 MED ORDER — INSULIN GLARGINE-YFGN 100 UNIT/ML ~~LOC~~ SOLN
15.0000 [IU] | Freq: Every day | SUBCUTANEOUS | Status: DC
  Administered 2022-05-01 – 2022-05-08 (×8): 15 [IU] via SUBCUTANEOUS
  Filled 2022-05-01 (×8): qty 0.15

## 2022-05-01 MED ORDER — LORAZEPAM 1 MG PO TABS
1.0000 mg | ORAL_TABLET | Freq: Every day | ORAL | Status: AC
  Administered 2022-05-01: 1 mg via ORAL
  Filled 2022-05-01: qty 1

## 2022-05-01 MED ORDER — ERGOCALCIFEROL 200 MCG/ML PO SOLN
4000.0000 [IU] | Freq: Every day | ORAL | Status: DC
  Administered 2022-05-01 – 2022-05-08 (×8): 4000 [IU] via ORAL
  Filled 2022-05-01 (×8): qty 0.5

## 2022-05-01 MED ORDER — ADULT MULTIVITAMIN W/MINERALS CH
1.0000 | ORAL_TABLET | Freq: Every day | ORAL | Status: DC
  Administered 2022-05-01 – 2022-05-08 (×8): 1 via ORAL
  Filled 2022-05-01 (×8): qty 1

## 2022-05-01 MED ORDER — INSULIN ASPART 100 UNIT/ML IJ SOLN: 0.0000 [IU] | Freq: Every day | INTRAMUSCULAR | Status: DC

## 2022-05-01 MED ORDER — ALUM & MAG HYDROXIDE-SIMETH 200-200-20 MG/5ML PO SUSP: 30.0000 mL | ORAL | Status: DC | PRN

## 2022-05-01 MED ORDER — GABAPENTIN 300 MG PO CAPS
300.0000 mg | ORAL_CAPSULE | Freq: Three times a day (TID) | ORAL | Status: DC
  Administered 2022-05-01 – 2022-05-03 (×9): 300 mg via ORAL
  Filled 2022-05-01 (×9): qty 1

## 2022-05-01 MED ORDER — DULOXETINE HCL 60 MG PO CPEP
60.0000 mg | ORAL_CAPSULE | Freq: Every day | ORAL | Status: DC
  Administered 2022-05-01 – 2022-05-08 (×8): 60 mg via ORAL
  Filled 2022-05-01 (×8): qty 1

## 2022-05-01 MED ORDER — ROSUVASTATIN CALCIUM 20 MG PO TABS
40.0000 mg | ORAL_TABLET | Freq: Every day | ORAL | Status: DC
  Administered 2022-05-01 – 2022-05-08 (×8): 40 mg via ORAL
  Filled 2022-05-01: qty 8
  Filled 2022-05-01 (×2): qty 2
  Filled 2022-05-01 (×6): qty 8

## 2022-05-01 MED ORDER — LISINOPRIL 20 MG PO TABS
20.0000 mg | ORAL_TABLET | Freq: Every day | ORAL | Status: DC
  Administered 2022-05-01 – 2022-05-08 (×7): 20 mg via ORAL
  Filled 2022-05-01 (×8): qty 1

## 2022-05-01 MED ORDER — ROPINIROLE HCL 1 MG PO TABS
2.0000 mg | ORAL_TABLET | Freq: Every day | ORAL | Status: DC
  Administered 2022-05-01 – 2022-05-07 (×7): 2 mg via ORAL
  Filled 2022-05-01 (×7): qty 2

## 2022-05-01 MED ORDER — PROPRANOLOL HCL ER 60 MG PO CP24
60.0000 mg | ORAL_CAPSULE | Freq: Every day | ORAL | Status: DC
  Administered 2022-05-01 – 2022-05-07 (×7): 60 mg via ORAL
  Filled 2022-05-01 (×8): qty 1

## 2022-05-01 MED ORDER — TRAZODONE HCL 100 MG PO TABS
100.0000 mg | ORAL_TABLET | Freq: Every evening | ORAL | Status: DC | PRN
  Administered 2022-05-01 – 2022-05-02 (×2): 100 mg via ORAL
  Filled 2022-05-01 (×3): qty 1

## 2022-05-01 MED ORDER — METFORMIN HCL 500 MG PO TABS
1000.0000 mg | ORAL_TABLET | Freq: Two times a day (BID) | ORAL | Status: DC
  Administered 2022-05-01 – 2022-05-08 (×15): 1000 mg via ORAL
  Filled 2022-05-01 (×15): qty 2

## 2022-05-01 MED ORDER — AMLODIPINE BESYLATE 5 MG PO TABS
10.0000 mg | ORAL_TABLET | Freq: Every day | ORAL | Status: DC
  Administered 2022-05-01 – 2022-05-08 (×7): 10 mg via ORAL
  Filled 2022-05-01 (×8): qty 2

## 2022-05-01 MED ORDER — ACETAMINOPHEN 325 MG PO TABS
650.0000 mg | ORAL_TABLET | Freq: Four times a day (QID) | ORAL | Status: DC | PRN
  Administered 2022-05-01 – 2022-05-06 (×3): 650 mg via ORAL
  Filled 2022-05-01 (×3): qty 2

## 2022-05-01 MED ORDER — QUETIAPINE FUMARATE 100 MG PO TABS
400.0000 mg | ORAL_TABLET | Freq: Every day | ORAL | Status: DC
  Administered 2022-05-01 – 2022-05-03 (×3): 400 mg via ORAL
  Filled 2022-05-01 (×3): qty 4

## 2022-05-01 MED ORDER — OYSTER SHELL CALCIUM/D3 500-5 MG-MCG PO TABS: 1.0000 | ORAL_TABLET | Freq: Every day | ORAL | Status: DC

## 2022-05-01 MED ORDER — INSULIN ASPART 100 UNIT/ML IJ SOLN
0.0000 [IU] | Freq: Three times a day (TID) | INTRAMUSCULAR | Status: DC
  Administered 2022-05-01: 11 [IU] via SUBCUTANEOUS
  Administered 2022-05-02 (×2): 7 [IU] via SUBCUTANEOUS
  Administered 2022-05-02: 11 [IU] via SUBCUTANEOUS
  Administered 2022-05-03: 4 [IU] via SUBCUTANEOUS
  Administered 2022-05-03: 7 [IU] via SUBCUTANEOUS
  Administered 2022-05-03: 4 [IU] via SUBCUTANEOUS
  Administered 2022-05-04: 3 [IU] via SUBCUTANEOUS
  Administered 2022-05-04 – 2022-05-05 (×3): 4 [IU] via SUBCUTANEOUS
  Administered 2022-05-05 – 2022-05-06 (×4): 3 [IU] via SUBCUTANEOUS
  Administered 2022-05-06: 7 [IU] via SUBCUTANEOUS
  Administered 2022-05-07 – 2022-05-08 (×4): 4 [IU] via SUBCUTANEOUS
  Administered 2022-05-08: 3 [IU] via SUBCUTANEOUS
  Filled 2022-05-01 (×8): qty 1

## 2022-05-01 MED ORDER — MAGNESIUM HYDROXIDE 400 MG/5ML PO SUSP: 30.0000 mL | Freq: Every day | ORAL | Status: DC | PRN

## 2022-05-01 NOTE — [facility] Suicide Risk Assessment (Signed)
[facility] INPATIENT:  Family/Significant Other Suicide Prevention Education  Suicide Prevention Education:  Education Completed; Bryan Flowers, brother, [phone] has been identified by the patient as the family member/significant other with whom the patient will be residing, and identified as the person(s) who will aid the patient in the event of a mental health crisis (suicidal ideations/suicide attempt).  With written consent from the patient, the family member/significant other has been provided the following suicide prevention education, prior to the and/or following the discharge of the patient.  The suicide prevention education provided includes the following: Suicide risk factors Suicide prevention and interventions National Suicide Hotline telephone number [facility] assessment telephone number [facility] Emergency Assistance [place] and/or Residential Mobile Crisis Unit telephone number  Request made of family/significant other to: Remove weapons (e.g., guns, rifles, knives), all items previously/currently identified as safety concern.   Remove drugs/medications (over-the-counter, prescriptions, illicit drugs), all items previously/currently identified as a safety concern.  The family member/significant other verbalizes understanding of the suicide prevention education information provided.  The family member/significant other agrees to remove the items of safety concern listed above.  Pt's brother stated that pt has been depressed and that his ex-girlfriend "isn't right in the head" and "really has a hold on him" referring to pt. Brother states that pt has been funneling money to his ex-girlfriend for the past 6 or Flowers months and pt has not been paying his bills, rent or medications. He said that he and his cousin have been assisting pt financially because he has ignored his own needs.   Pt's brother stated that pt had a letter from the department of social  services working to assistance pt  with medications and other financial resources. Brother stated he talked with Bryan Flowers, [phone], a [place] case worker, who is working with pt and states that pt has Flowers evidence against himself regarding his financial neglect due to him using his funds to support his ex-girlfriend.   He stated that pt may need a higher level of care and CSW will provide resources for community referrals.     Bryan Flowers 05/01/2022, 3:52 PM

## 2022-05-01 NOTE — Progress Notes (Addendum)
1342: Living Well with Diabetes Book received and given to patient.   1812: Patient is alert and oriented x 4. Pleasant and cooperative. Engages easily in conversation. Reports mood "I'm in a good frame of mind right now". Patient reports "I got very little sleep last night. I feel tired". Reports having trouble falling asleep and staying asleep. Rates depression 3/10. Rates anxiety 4/10. Denies SI/HI. Denies A/V hallucinations. No evidence of delusions noted or reported.   Compliant with routine medication regimen without difficulty. Reports chronic pain due to Fibromyalgia. PRN Tylenol 650 mg po administered at 0904 for verbal complaints of back pain.   Ambulates independently to bathroom, in hallway and in room without aid of assistive device. Able to feed self meals - adequate nutritional intake reported. Able to perform ADLs independently. Continent of B&B. LBM: 07/27. No negative nor aggressive behaviors noted or reported at this time.

## 2022-05-01 NOTE — Progress Notes (Signed)
   05/01/22 0000  Psych Admission Type (Psych Patients Only)  Admission Status Voluntary  Psychosocial Assessment  Patient Complaints Anxiety;Decreased concentration  Eye Contact Fair  Facial Expression Anxious  Affect Anxious  Speech Unremarkable  Interaction Assertive  Motor Activity Other (Comment) (WDL)  Appearance/Hygiene In scrubs  Behavior Characteristics Cooperative;Appropriate to situation  Mood Depressed;Anxious  Thought Process  Coherency WDL  Content WDL  Delusions None reported or observed  Perception WDL  Hallucination None reported or observed  Judgment WDL  Confusion None  Danger to Self  Current suicidal ideation? Denies  Agreement Not to Harm Self Yes  Description of Agreement Verbal
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
## 2022-05-01 NOTE — BH IP Treatment Plan (Signed)
Interdisciplinary Treatment and Diagnostic Plan Update  05/01/2022 Time of Session: 10:00AM Bryan Flowers MRN: [id number]  Principal Diagnosis: MDD (major depressive disorder), recurrent, severe, with psychosis ([place])  Secondary Diagnoses: Principal Problem:   MDD (major depressive disorder), recurrent, severe, with psychosis ([place]) Active Problems:   MDD (major depressive disorder)   Current Medications:  Current Facility-Administered Medications  Medication Dose Route Frequency Provider Last Rate Last Admin   acetaminophen (TYLENOL) tablet 650 mg  650 mg Oral Q6H PRN Sherlon Handing, NP   650 mg at 05/01/22 0904   alum & mag hydroxide-simeth (MAALOX/MYLANTA) 200-200-20 MG/5ML suspension 30 mL  30 mL Oral Q4H PRN Sherlon Handing, NP       amLODipine (NORVASC) tablet 10 mg  10 mg Oral Daily Waldon Merl F, NP   10 mg at 05/01/22 0818   carbidopa-levodopa (SINEMET CR) 50-200 MG per tablet controlled release 1 tablet  1 tablet Oral BID Parks Ranger, DO   1 tablet at 05/01/22 1158   DULoxetine (CYMBALTA) DR capsule 60 mg  60 mg Oral Daily Parks Ranger, DO   60 mg at 05/01/22 1158   ergocalciferol (VITAMIN D2) (DRISDOL) 8000 units/mL (200 mcg/mL) drops 4,000 Units  4,000 Units Oral Daily Parks Ranger, DO       gabapentin (NEURONTIN) capsule 300 mg  300 mg Oral TID Parks Ranger, DO   300 mg at 05/01/22 1158   insulin aspart (novoLOG) injection 0-15 Units  0-15 Units Subcutaneous TID WC Parks Ranger, DO       insulin aspart (novoLOG) injection 6 Units  6 Units Subcutaneous TID WC Parks Ranger, DO   6 Units at 05/01/22 1158   insulin glargine-yfgn (SEMGLEE) injection 15 Units  15 Units Subcutaneous Daily Parks Ranger, DO       lamoTRIgine (LAMICTAL) tablet 400 mg  400 mg Oral QHS Herrick, Richard Edward, DO       lisinopril (ZESTRIL) tablet 20 mg  20 mg Oral Daily Waldon Merl F, NP   20 mg at 05/01/22  0818   living well with diabetes book MISC   Does not apply Once Parks Ranger, DO       LORazepam (ATIVAN) tablet 1 mg  1 mg Oral QHS Waldon Merl F, NP       magnesium hydroxide (MILK OF MAGNESIA) suspension 30 mL  30 mL Oral Daily PRN Waldon Merl F, NP       metFORMIN (GLUCOPHAGE) tablet 1,000 mg  1,000 mg Oral BID WC Waldon Merl F, NP   1,000 mg at 05/01/22 4098   multivitamin with minerals tablet 1 tablet  1 tablet Oral Daily Parks Ranger, DO   1 tablet at 05/01/22 1158   propranolol ER (INDERAL LA) 24 hr capsule 60 mg  60 mg Oral QHS Parks Ranger, DO       QUEtiapine (SEROQUEL) tablet 400 mg  400 mg Oral QHS Herrick, Richard Edward, DO       rOPINIRole (REQUIP) tablet 2 mg  2 mg Oral QHS Herrick, Richard Edward, DO       rosuvastatin (CRESTOR) tablet 40 mg  40 mg Oral Daily Parks Ranger, DO   40 mg at 05/01/22 1157   traZODone (DESYREL) tablet 100 mg  100 mg Oral QHS PRN Parks Ranger, DO       PTA Medications: Medications Prior to Admission  Medication Sig Dispense Refill Last Dose   acetaminophen (TYLENOL) 500  MG tablet Take 1,000 mg by mouth every 6 (six) hours as needed (pain).      amLODipine (NORVASC) 5 MG tablet Take 1 tablet (5 mg total) by mouth daily. (Patient not taking: Reported on 04/29/2022) 90 tablet 3    Armodafinil 250 MG tablet Take by mouth in the morning.  (Patient not taking: Reported on 04/29/2022)      b complex vitamins capsule Take 1 capsule by mouth daily. (Patient not taking: Reported on 04/29/2022)      carbidopa-levodopa (SINEMET CR) 50-200 MG per tablet Take 1 tablet by mouth 3 (three) times daily.  (Patient not taking: Reported on 04/29/2022)      cholecalciferol (VITAMIN D3) 25 MCG (1000 UT) tablet Take 1,000 Units by mouth daily.      DULoxetine (CYMBALTA) 60 MG capsule Take 120 mg by mouth at bedtime. (Patient not taking: Reported on 04/29/2022)      glucose blood (ONETOUCH VERIO) test strip  DX:E11.69, LON:99 Check TID 100 each 12    glucose blood test strip Check once daily fasting, and up to 3 times a day as needed. 100 each 12    insulin degludec (TRESIBA FLEXTOUCH) 200 UNIT/ML FlexTouch Pen Inject 30-50 Units into the skin every evening. (Patient not taking: Reported on 04/29/2022) 15 mL 11    Insulin Pen Needle (NOVOFINE) 32G X 6 MM MISC 1 each by Does not apply route daily. 100 each 3    lamoTRIgine (LAMICTAL) 200 MG tablet Take 400 mg by mouth at bedtime.  (Patient not taking: Reported on 04/29/2022)      LORazepam (ATIVAN) 1 MG tablet Typically takes  1 to 2 times daily (Patient not taking: Reported on 04/29/2022)      metFORMIN (GLUCOPHAGE) 1000 MG tablet Take 1 tablet (1,000 mg total) by mouth 2 (two) times daily with a meal. (Patient not taking: Reported on 04/29/2022) 180 tablet 3    pregabalin (LYRICA) 50 MG capsule Take 1 capsule (50 mg total) by mouth 2 (two) times daily. (Patient not taking: Reported on 04/29/2022) 60 capsule 12    propranolol ER (INDERAL LA) 60 MG 24 hr capsule Take 60 mg by mouth at bedtime.       QUEtiapine (SEROQUEL) 300 MG tablet Take 300 mg by mouth at bedtime.      quinapril (ACCUPRIL) 20 MG tablet Take 1 tablet (20 mg total) by mouth at bedtime. (Patient not taking: Reported on 04/29/2022) 90 tablet 1    rOPINIRole (REQUIP) 2 MG tablet Take 2 mg by mouth 2 (two) times daily.      rosuvastatin (CRESTOR) 40 MG tablet Take 1 tablet (40 mg total) by mouth at bedtime. (Patient not taking: Reported on 04/29/2022) 90 tablet 1    RYBELSUS 7 MG TABS TAKE 1 TABLET BY MOUTH DAILY (Patient not taking: Reported on 04/29/2022) 30 tablet 1    zolpidem (AMBIEN CR) 12.5 MG CR tablet Take 12.5 mg by mouth at bedtime.       Patient Stressors: Financial difficulties   Health problems   Loss of relationships    Medication change or noncompliance    Patient Strengths: Ability for insight  Average or above average intelligence  Capable of independent living    Treatment Modalities: Medication Management, Group therapy, Case management,  1 to 1 session with clinician, Psychoeducation, Recreational therapy.   Physician Treatment Plan for Primary Diagnosis: MDD (major depressive disorder), recurrent, severe, with psychosis ([place]) Long Term Goal(s): Improvement in symptoms so as ready for discharge  Short Term Goals: Ability to identify changes in lifestyle to reduce recurrence of condition will improve Ability to verbalize feelings will improve Ability to disclose and discuss suicidal ideas Ability to demonstrate self-control will improve Ability to identify and develop effective coping behaviors will improve Ability to maintain clinical measurements within normal limits will improve Compliance with prescribed medications will improve Ability to identify triggers associated with substance abuse/mental health issues will improve  Medication Management: Evaluate patient's response, side effects, and tolerance of medication regimen.  Therapeutic Interventions: 1 to 1 sessions, Unit Group sessions and Medication administration.  Evaluation of Outcomes: Not Met  Physician Treatment Plan for Secondary Diagnosis: Principal Problem:   MDD (major depressive disorder), recurrent, severe, with psychosis ([place]) Active Problems:   MDD (major depressive disorder)  Long Term Goal(s): Improvement in symptoms so as ready for discharge   Short Term Goals: Ability to identify changes in lifestyle to reduce recurrence of condition will improve Ability to verbalize feelings will improve Ability to disclose and discuss suicidal ideas Ability to demonstrate self-control will improve Ability to identify and develop effective coping behaviors will improve Ability to maintain clinical measurements within normal limits will improve Compliance with prescribed medications will improve Ability to identify triggers associated with substance abuse/mental health issues  will improve     Medication Management: Evaluate patient's response, side effects, and tolerance of medication regimen.  Therapeutic Interventions: 1 to 1 sessions, Unit Group sessions and Medication administration.  Evaluation of Outcomes: Not Met   RN Treatment Plan for Primary Diagnosis: MDD (major depressive disorder), recurrent, severe, with psychosis ([place]) Long Term Goal(s): Knowledge of disease and therapeutic regimen to maintain health will improve  Short Term Goals: Ability to remain free from injury will improve, Ability to verbalize frustration and anger appropriately will improve, Ability to demonstrate self-control, Ability to participate in decision making will improve, Ability to verbalize feelings will improve, Ability to identify and develop effective coping behaviors will improve, and Compliance with prescribed medications will improve  Medication Management: RN will administer medications as ordered by provider, will assess and evaluate patient's response and provide education to patient for prescribed medication. RN will report any adverse and/or side effects to prescribing provider.  Therapeutic Interventions: 1 on 1 counseling sessions, Psychoeducation, Medication administration, Evaluate responses to treatment, Monitor vital signs and CBGs as ordered, Perform/monitor CIWA, COWS, AIMS and Fall Risk screenings as ordered, Perform wound care treatments as ordered.  Evaluation of Outcomes: Not Met   LCSW Treatment Plan for Primary Diagnosis: MDD (major depressive disorder), recurrent, severe, with psychosis ([place]) Long Term Goal(s): Safe transition to appropriate next level of care at discharge, Engage patient in therapeutic group addressing interpersonal concerns.  Short Term Goals: Engage patient in aftercare planning with referrals and resources, Increase social support, Increase ability to appropriately verbalize feelings, Increase emotional regulation, Facilitate  acceptance of mental health diagnosis and concerns, and Increase skills for wellness and recovery  Therapeutic Interventions: Assess for all discharge needs, 1 to 1 time with Social worker, Explore available resources and support systems, Assess for adequacy in community support network, Educate family and significant other(s) on suicide prevention, Complete Psychosocial Assessment, Interpersonal group therapy.  Evaluation of Outcomes: Not Met   Progress in Treatment: Attending groups: No. Participating in groups: No. Taking medication as prescribed: Yes. Toleration medication: Yes. Family/Significant other contact made: No, will contact:  when given permission Patient understands diagnosis: Yes. Discussing patient identified problems/goals with staff: Yes. Medical problems stabilized or resolved: Yes. Denies suicidal/homicidal  ideation: Yes. Issues/concerns per patient self-inventory: No. Other: None  New problem(s) identified: No, Describe:  None  New Short Term/Long Term Goal(s): Patient to work towards medication management for mood stabilization;  development of comprehensive mental wellness plan.   Patient Goals:  "help with sleep, rent and finances"  Discharge Plan or Barriers: CSW will assist pt with development of appropriate discharge/aftercare plans.   Reason for Continuation of Hospitalization: Depression Medication stabilization  Estimated Length of Stay: 1-7 days  Last 3 [place] Suicide Severity Risk Score: Flowsheet Row Admission (Current) from 04/30/2022 in [facility] ED from 04/28/2022 in [facility] No Risk Low Risk       Last PHQ 2/9 Scores:    09/16/2021    2:02 PM 09/15/2021    1:15 PM 05/15/2021    1:47 PM  Depression screen PHQ 2/9  Decreased Interest 0 0 1  Down, Depressed, Hopeless 0 0 1  PHQ - 2 Score 0 0 2  Altered sleeping 0 0 1  Tired, decreased energy  0 0 1  Change in appetite 0 0 0  Feeling bad or failure about yourself  0 0 1  Trouble concentrating 0 0 1  Moving slowly or fidgety/restless 0 0 1  Suicidal thoughts 0 0 0  PHQ-9 Score 0 0 7  Difficult doing work/chores Not difficult at all Not difficult at all Somewhat difficult    Scribe for Treatment Team: Aryaman Haliburton A [place], Latanya Presser 05/01/2022 1:38 PM

## 2022-05-01 NOTE — BHH Suicide Risk Assessment (Signed)
[facility] Admission Suicide Risk Assessment   Nursing information obtained from:  Patient Demographic factors:  Male, Caucasian Current Mental Status:  NA Loss Factors:  Loss of significant relationship, Financial problems / change in socioeconomic status Historical Factors:  Family history of mental illness or substance abuse, Anniversary of important loss Risk Reduction Factors:  NA  Total Time spent with patient: 1 Hour Principal Problem: MDD (major depressive disorder), recurrent, severe, with psychosis ([place]) Diagnosis:  Principal Problem:   MDD (major depressive disorder), recurrent, severe, with psychosis ([place]) Active Problems:   MDD (major depressive disorder)  Subjective Data: Pt presents to ER from home with c/o severe depression, and suicidal thoughts.  Pt states he has not been able to take his meds in last 2-3 weeks d/t affordability.  Pt states he has gone through recent stress in his life with pt and his s/o breaking up, and problems with paying bills.  Pt states he has hx of depression, BPD and parkinson's.  Pt endorses SI without plan, but denies HI, AVH, and states he has not been able to eat and sleep much lately.  Pt otherwise A&O x4 at this time in NAD.  Continued Clinical Symptoms:  Alcohol Use Disorder Identification Test Final Score (AUDIT): 0 The "Alcohol Use Disorders Identification Test", Guidelines for Use in Primary Care, Second Edition.  World Pharmacologist [facility]). Score between 0-7:  no or low risk or alcohol related problems. Score between 8-15:  moderate risk of alcohol related problems. Score between 16-19:  high risk of alcohol related problems. Score 20 or above:  warrants further diagnostic evaluation for alcohol dependence and treatment.   CLINICAL FACTORS:   Bipolar Disorder:   Depressive phase   Musculoskeletal: Strength & Muscle Tone: within normal limits Gait & Station: normal Patient leans: N/A  Psychiatric Specialty Exam:  Presentation   General Appearance: Appropriate for Environment  Eye Contact:Good  Speech:Clear and Coherent  Speech Volume:Normal  Handedness:No data recorded  Mood and Affect  Mood:Depressed  Affect:Congruent   Thought Process  Thought Processes:Coherent  Descriptions of Associations:Intact  Orientation:Full (Time, Place and Person)  Thought Content:WDL  History of Schizophrenia/Schizoaffective disorder:No  Duration of Psychotic Symptoms:No data recorded Hallucinations:Hallucinations: None  Ideas of Reference:None  Suicidal Thoughts:Suicidal Thoughts: No  Homicidal Thoughts:Homicidal Thoughts: No   Sensorium  Memory:Immediate Good; Recent Good; Remote Good  Judgment:Good  Insight:Good   Executive Functions  Concentration:Good  Attention Span:Good  [place] of Knowledge:Good  Language:Good   Psychomotor Activity  Psychomotor Activity:Psychomotor Activity: Normal   Assets  Assets:Communication Skills; Desire for Improvement; Financial Resources/Insurance; Housing; Resilience   Sleep  Sleep:Sleep: Poor     Blood pressure (!) 149/73, pulse 81, temperature 98 F (36.7 C), resp. rate 18, height '5\' 11"'$  (1.803 m), weight 81.2 kg, SpO2 100 %. Body mass index is 24.97 kg/m.   COGNITIVE FEATURES THAT CONTRIBUTE TO RISK:  None    SUICIDE RISK:   Minimal: No identifiable suicidal ideation.  Patients presenting with no risk factors but with morbid ruminations; may be classified as minimal risk based on the severity of the depressive symptoms  PLAN OF CARE: See orders  I certify that inpatient services furnished can reasonably be expected to improve the patient's condition.   [place], DO 05/01/2022, 11:03 AM

## 2022-05-01 NOTE — Plan of Care (Signed)
  Problem: Education: Goal: Utilization of techniques to improve thought processes will improve Outcome: Progressing Goal: Knowledge of the prescribed therapeutic regimen will improve Outcome: Progressing   Problem: Activity: Goal: Interest or engagement in leisure activities will improve Outcome: Progressing Goal: Imbalance in normal sleep/wake cycle will improve Outcome: Not Progressing Note: Patient reports having difficulty falling/staying asleep last night.    Problem: Coping: Goal: Coping ability will improve Outcome: Progressing Goal: Will verbalize feelings Outcome: Progressing   Problem: Health Behavior/Discharge Planning: Goal: Ability to make decisions will improve Outcome: Progressing Goal: Compliance with therapeutic regimen will improve Outcome: Progressing   Problem: Role Relationship: Goal: Will demonstrate positive changes in social behaviors and relationships Outcome: Progressing   Problem: Safety: Goal: Ability to disclose and discuss suicidal ideas will improve Outcome: Progressing Goal: Ability to identify and utilize support systems that promote safety will improve Outcome: Progressing   Problem: Self-Concept: Goal: Will verbalize positive feelings about self Outcome: Progressing Goal: Level of anxiety will decrease Outcome: Progressing   Problem: Education: Goal: Ability to state activities that reduce stress will improve Outcome: Progressing   Problem: Coping: Goal: Ability to identify and develop effective coping behavior will improve Outcome: Progressing   Problem: Self-Concept: Goal: Ability to identify factors that promote anxiety will improve Outcome: Progressing Goal: Level of anxiety will decrease Outcome: Progressing Goal: Ability to modify response to factors that promote anxiety will improve Outcome: Progressing

## 2022-05-01 NOTE — H&P (Signed)
Psychiatric Admission Assessment Adult  Patient Identification: Bryan Flowers MRN:  [id number] Date of Evaluation:  05/01/2022 Chief Complaint:  MDD (major depressive disorder), recurrent, severe, with psychosis ([place]) [F33.3] MDD (major depressive disorder) [F32.9] Principal Diagnosis: MDD (major depressive disorder), recurrent, severe, with psychosis ([place]) Diagnosis:  Principal Problem:   MDD (major depressive disorder), recurrent, severe, with psychosis ([place]) Active Problems:   MDD (major depressive disorder)  History of Present Illness: Bryan Flowers is a 61 year old white male who presents to the emergency room with depression and suicidal ideation.  He is having a lot of financial problems and states that this is the fourth anniversary of his wife passing away.  He recently broke up with his girlfriend and states that they were supposed to get married.  He is feeling hopeless and helpless.  He does see a psychiatrist in [place] at [facility].  He is currently living in an apartment by himself and endorses anhedonia, difficulty sleeping, anxiety, depressed mood and suicidal thoughts.  He has never been psychiatrically hospitalized in the past.  No past suicide attempts.  He has done well with Lamictal Cymbalta and Seroquel.  He is having trouble financially paying for his medications and keeping up with his utility bills.  He has a history of Parkinson's disease and sees a neurologist at [facility].  He states that he has kept up with his psychiatric medications but not his hypertensive and diabetic medicines which is evidenced by his lab work.  PER INITIAL INTAKE: Pt presents to ER from home with c/o severe depression, and suicidal thoughts.  Pt states he has not been able to take his meds in last 2-3 weeks d/t affordability.  Pt states he has gone through recent stress in his life with pt and his s/o breaking up, and problems with paying bills.  Pt states he has hx of depression, BPD and  parkinson's.  Pt endorses SI without plan, but denies HI, AVH, and states he has not been able to eat and sleep much lately.  Pt otherwise A&O x4 at this time in NAD.  Associated Signs/Symptoms: Depression Symptoms:  depressed mood, anhedonia, insomnia, hopelessness, suicidal thoughts without plan, anxiety, Duration of Depression Symptoms: Greater than two weeks  (Hypo) Manic Symptoms:  None Anxiety Symptoms:  Excessive Worry, Psychotic Symptoms:  None PTSD Symptoms: NA Total Time spent with patient: 1 hour  Past Psychiatric History: He is currently seeing a psychiatrist in [place] at [facility].  He has done well on Seroquel, Cymbalta, and Lamictal and takes Ambien as needed.  He has one visit to the emergency room back in 2018 for depression.  No past admissions.  Is the patient at risk to self? Yes.    Has the patient been a risk to self in the past 6 months? Yes.    Has the patient been a risk to self within the distant past? Yes.    Is the patient a risk to others? No.  Has the patient been a risk to others in the past 6 months? No.  Has the patient been a risk to others within the distant past? No.   [place] Scale:  [name] Admission (Current) from 04/30/2022 in [facility] ED from 04/28/2022 in [facility] No Risk Low Risk        Prior Inpatient Therapy:   Prior Outpatient Therapy:    Alcohol Screening: Patient refused Alcohol Screening Tool: Yes 1. How often do  you have a drink containing alcohol?: Never 2. How many drinks containing alcohol do you have on a typical day when you are drinking?: 1 or 2 3. How often do you have six or more drinks on one occasion?: Never AUDIT-C Score: 0 4. How often during the last year have you found that you were not able to stop drinking once you had started?: Never 5. How often during the last year have you failed to do  what was normally expected from you because of drinking?: Never 6. How often during the last year have you needed a first drink in the morning to get yourself going after a heavy drinking session?: Never 7. How often during the last year have you had a feeling of guilt of remorse after drinking?: Never 8. How often during the last year have you been unable to remember what happened the night before because you had been drinking?: Never 9. Have you or someone else been injured as a result of your drinking?: No 10. Has a relative or friend or a doctor or another health worker been concerned about your drinking or suggested you cut down?: No Alcohol Use Disorder Identification Test Final Score (AUDIT): 0 Substance Abuse History in the last 12 months:  No. Consequences of Substance Abuse: NA Previous Psychotropic Medications: Yes  Psychological Evaluations: Yes  Past Medical History:  Past Medical History:  Diagnosis Date   Anemia    iron deficiency   Anxiety    Bipolar 1 disorder, manic, moderate ([place])    Blood transfusion without reported diagnosis    due to GI bleeding-ulcers   Claudication ([place]) 07/16/2017   Depression    Diabetes mellitus without complication (HCC)    GERD (gastroesophageal reflux disease)    Hyperlipidemia    Hypertension    Insomnia    Parkinson disease ([place])    Peptic ulcer disease with hemorrhage    Sleep apnea    on cpap    Past Surgical History:  Procedure Laterality Date   ESOPHAGOGASTRODUODENOSCOPY Left 03/16/2016   Procedure: ESOPHAGOGASTRODUODENOSCOPY (EGD);  Surgeon: Manus Gunning, MD;  Location: [facility];  Service: Gastroenterology;  Laterality: Left;   FOOT FRACTURE SURGERY Right    Family History:  Family History  Problem Relation Age of Onset   Cancer Mother    Heart disease Father    Arthritis Brother    Colon polyps Neg Hx    Colon cancer Neg Hx    Esophageal cancer Neg Hx    Rectal cancer Neg Hx    Stomach cancer Neg Hx     Family Psychiatric  History: Unknown Tobacco Screening:   Social History:  Social History   Substance and Sexual Activity  Alcohol Use No   Alcohol/week: 0.0 standard drinks of alcohol     Social History   Substance and Sexual Activity  Drug Use No    Additional Social History:                           Allergies:  No Known Allergies Lab Results:  Results for orders placed or performed during the hospital encounter of 04/30/22 (from the past 48 hour(s))  Glucose, capillary     Status: Abnormal   Collection Time: 05/01/22  7:39 AM  Result Value Ref Range   Glucose-Capillary 238 (H) 70 - 99 mg/dL    Comment: Glucose reference range applies only to samples taken after fasting for at least 8 hours.  Blood Alcohol level:  Lab Results  Component Value Date   ETH <10 04/28/2022   ETH <5 44/31/5400    Metabolic Disorder Labs:  Lab Results  Component Value Date   HGBA1C 12.1 (H) 04/28/2022   MPG 300.57 04/28/2022   MPG 280 09/15/2021   No results found for: "PROLACTIN" Lab Results  Component Value Date   CHOL 247 (H) 09/15/2021   TRIG 307 (H) 09/15/2021   HDL 44 09/15/2021   CHOLHDL 5.6 (H) 09/15/2021   LDLCALC 156 (H) 09/15/2021   LDLCALC 129 (H) 05/15/2021    Current Medications: Current Facility-Administered Medications  Medication Dose Route Frequency Provider Last Rate Last Admin   acetaminophen (TYLENOL) tablet 650 mg  650 mg Oral Q6H PRN Sherlon Handing, NP   650 mg at 05/01/22 0904   alum & mag hydroxide-simeth (MAALOX/MYLANTA) 200-200-20 MG/5ML suspension 30 mL  30 mL Oral Q4H PRN Sherlon Handing, NP       amLODipine (NORVASC) tablet 10 mg  10 mg Oral Daily Waldon Merl F, NP   10 mg at 05/01/22 0818   [START ON 05/02/2022] calcium-vitamin D (OSCAL WITH D) 500-5 MG-MCG per tablet 1 tablet  1 tablet Oral Q breakfast Parks Ranger, DO       carbidopa-levodopa (SINEMET CR) 50-200 MG per tablet controlled release 1 tablet  1  tablet Oral BID Parks Ranger, DO       DULoxetine (CYMBALTA) DR capsule 60 mg  60 mg Oral Daily Parks Ranger, DO       gabapentin (NEURONTIN) capsule 300 mg  300 mg Oral TID Parks Ranger, DO       insulin aspart (novoLOG) injection 0-20 Units  0-20 Units Subcutaneous TID WC Sherlon Handing, NP   7 Units at 05/01/22 0819   insulin aspart (novoLOG) injection 0-5 Units  0-5 Units Subcutaneous QHS Waldon Merl F, NP       insulin aspart (novoLOG) injection 6 Units  6 Units Subcutaneous TID WC Sherlon Handing, NP   6 Units at 05/01/22 8676   lamoTRIgine (LAMICTAL) tablet 400 mg  400 mg Oral QHS Parks Ranger, DO       lisinopril (ZESTRIL) tablet 20 mg  20 mg Oral Daily Waldon Merl F, NP   20 mg at 05/01/22 0818   LORazepam (ATIVAN) tablet 1 mg  1 mg Oral QHS Waldon Merl F, NP       magnesium hydroxide (MILK OF MAGNESIA) suspension 30 mL  30 mL Oral Daily PRN Waldon Merl F, NP       metFORMIN (GLUCOPHAGE) tablet 1,000 mg  1,000 mg Oral BID WC Waldon Merl F, NP   1,000 mg at 05/01/22 1950   multivitamin with minerals tablet 1 tablet  1 tablet Oral Daily Parks Ranger, DO       propranolol ER (INDERAL LA) 24 hr capsule 60 mg  60 mg Oral QHS Alie Hardgrove Edward, DO       QUEtiapine (SEROQUEL) tablet 400 mg  400 mg Oral QHS Kadarius Cuffe Edward, DO       rOPINIRole (REQUIP) tablet 2 mg  2 mg Oral QHS Parks Ranger, DO       rosuvastatin (CRESTOR) tablet 40 mg  40 mg Oral Daily Parks Ranger, DO       traZODone (DESYREL) tablet 100 mg  100 mg Oral QHS PRN Parks Ranger, DO       PTA Medications: Medications Prior  to Admission  Medication Sig Dispense Refill Last Dose   acetaminophen (TYLENOL) 500 MG tablet Take 1,000 mg by mouth every 6 (six) hours as needed (pain).      amLODipine (NORVASC) 5 MG tablet Take 1 tablet (5 mg total) by mouth daily. (Patient not taking: Reported on  04/29/2022) 90 tablet 3    Armodafinil 250 MG tablet Take by mouth in the morning.  (Patient not taking: Reported on 04/29/2022)      b complex vitamins capsule Take 1 capsule by mouth daily. (Patient not taking: Reported on 04/29/2022)      carbidopa-levodopa (SINEMET CR) 50-200 MG per tablet Take 1 tablet by mouth 3 (three) times daily.  (Patient not taking: Reported on 04/29/2022)      cholecalciferol (VITAMIN D3) 25 MCG (1000 UT) tablet Take 1,000 Units by mouth daily.      DULoxetine (CYMBALTA) 60 MG capsule Take 120 mg by mouth at bedtime. 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 100 each 12    insulin degludec (TRESIBA FLEXTOUCH) 200 UNIT/ML FlexTouch Pen Inject 30-50 Units into the skin every evening. (Patient not taking: Reported on 04/29/2022) 15 mL 11    Insulin Pen Needle (NOVOFINE) 32G X 6 MM MISC 1 each by Does not apply route daily. 100 each 3    lamoTRIgine (LAMICTAL) 200 MG tablet Take 400 mg by mouth at bedtime.  (Patient not taking: Reported on 04/29/2022)      LORazepam (ATIVAN) 1 MG tablet Typically takes  1 to 2 times daily (Patient not taking: Reported on 04/29/2022)      metFORMIN (GLUCOPHAGE) 1000 MG tablet Take 1 tablet (1,000 mg total) by mouth 2 (two) times daily with a meal. (Patient not taking: Reported on 04/29/2022) 180 tablet 3    pregabalin (LYRICA) 50 MG capsule Take 1 capsule (50 mg total) by mouth 2 (two) times daily. (Patient not taking: Reported on 04/29/2022) 60 capsule 12    propranolol ER (INDERAL LA) 60 MG 24 hr capsule Take 60 mg by mouth at bedtime.       QUEtiapine (SEROQUEL) 300 MG tablet Take 300 mg by mouth at bedtime.      quinapril (ACCUPRIL) 20 MG tablet Take 1 tablet (20 mg total) by mouth at bedtime. (Patient not taking: Reported on 04/29/2022) 90 tablet 1    rOPINIRole (REQUIP) 2  MG tablet Take 2 mg by mouth 2 (two) times daily.      rosuvastatin (CRESTOR) 40 MG tablet Take 1 tablet (40 mg total) by mouth at bedtime. (Patient not taking: Reported on 04/29/2022) 90 tablet 1    RYBELSUS 7 MG TABS TAKE 1 TABLET BY MOUTH DAILY (Patient not taking: Reported on 04/29/2022) 30 tablet 1    zolpidem (AMBIEN CR) 12.5 MG CR tablet Take 12.5 mg by mouth at bedtime.       Musculoskeletal: Strength & Muscle Tone: within normal limits Gait & Station: normal Patient leans: N/A            Psychiatric Specialty Exam:  Presentation  General Appearance: Appropriate for Environment  Eye Contact:Good  Speech:Clear and Coherent  Speech Volume:Normal  Handedness:No data recorded  Mood and Affect  Mood:Depressed  Affect:Congruent   Thought Process  Thought Processes:Coherent  Duration of Psychotic Symptoms: No data  recorded Past Diagnosis of Schizophrenia or Psychoactive disorder: No  Descriptions of Associations:Intact  Orientation:Full (Time, Place and Person)  Thought Content:WDL  Hallucinations:Hallucinations: None  Ideas of Reference:None  Suicidal Thoughts:Suicidal Thoughts: No  Homicidal Thoughts:Homicidal Thoughts: No   Sensorium  Memory:Immediate Good; Recent Good; Remote Good  Judgment:Good  Insight:Good   Executive Functions  Concentration:Good  Attention Span:Good  [place] of Knowledge:Good  Language:Good   Psychomotor Activity  Psychomotor Activity:Psychomotor Activity: Normal   Assets  Assets:Communication Skills; Desire for Improvement; Financial Resources/Insurance; Housing; Resilience   Sleep  Sleep:Sleep: Poor    Physical Exam: Physical Exam Vitals and nursing note reviewed.  Constitutional:      Appearance: Normal appearance. He is normal weight.  HENT:     Head: Normocephalic and atraumatic.     Nose: Nose normal.     Mouth/Throat:     Pharynx: Oropharynx is clear.  Eyes:      Extraocular Movements: Extraocular movements intact.     Pupils: Pupils are equal, round, and reactive to light.  Cardiovascular:     Rate and Rhythm: Normal rate and regular rhythm.     Pulses: Normal pulses.     Heart sounds: Normal heart sounds.  Pulmonary:     Effort: Pulmonary effort is normal.     Breath sounds: Normal breath sounds.  Abdominal:     General: Abdomen is flat. Bowel sounds are normal.     Palpations: Abdomen is soft.  Musculoskeletal:        General: Normal range of motion.     Cervical back: Normal range of motion and neck supple.  Skin:    General: Skin is warm and dry.  Neurological:     General: No focal deficit present.     Mental Status: He is alert and oriented to person, place, and time.  Psychiatric:        Attention and Perception: Attention and perception normal.        Mood and Affect: Mood is anxious and depressed. Affect is flat.        Speech: Speech normal.        Behavior: Behavior normal. Behavior is cooperative.        Thought Content: Thought content normal.        Cognition and Memory: Cognition and memory normal.        Judgment: Judgment normal.    Review of Systems  Constitutional: Negative.   HENT: Negative.    Eyes: Negative.   Respiratory: Negative.    Cardiovascular: Negative.   Gastrointestinal: Negative.   Genitourinary: Negative.   Musculoskeletal: Negative.   Skin: Negative.   Neurological: Negative.   Endo/Heme/Allergies: Negative.   Psychiatric/Behavioral:  Positive for depression and suicidal ideas. The patient is nervous/anxious and has insomnia.    Blood pressure (!) 149/73, pulse 81, temperature 98 F (36.7 C), resp. rate 18, height '5\' 11"'$  (1.803 m), weight 81.2 kg, SpO2 100 %. Body mass index is 24.97 kg/m.  Treatment Plan Summary: Daily contact with patient to assess and evaluate symptoms and progress in treatment, Medication management, and Plan restart home medications.  Change Ambien to trazodone as  needed.  Increase Seroquel to 400 mg at bedtime.  He is on an odd dose of 300 mg.  Observation Level/Precautions:  15 minute checks  Laboratory:  CBC Chemistry Profile  Psychotherapy:    Medications:    Consultations:    Discharge Concerns:    Estimated LOS:  Other:  Physician Treatment Plan for Primary Diagnosis: MDD (major depressive disorder), recurrent, severe, with psychosis ([place]) Long Term Goal(s): Improvement in symptoms so as ready for discharge  Short Term Goals: Ability to identify changes in lifestyle to reduce recurrence of condition will improve, Ability to verbalize feelings will improve, Ability to disclose and discuss suicidal ideas, Ability to demonstrate self-control will improve, Ability to identify and develop effective coping behaviors will improve, Ability to maintain clinical measurements within normal limits will improve, Compliance with prescribed medications will improve, and Ability to identify triggers associated with substance abuse/mental health issues will improve  Physician Treatment Plan for Secondary Diagnosis: Principal Problem:   MDD (major depressive disorder), recurrent, severe, with psychosis ([place]) Active Problems:   MDD (major depressive disorder)   I certify that inpatient services furnished can reasonably be expected to improve the patient's condition.    Parks Ranger, DO 7/28/202311:22 AM

## 2022-05-01 NOTE — Inpatient Diabetes Management (Addendum)
Inpatient Diabetes Program Recommendations  AACE/ADA: New Consensus Statement on Inpatient Glycemic Control (2015)  Target Ranges:  Prepandial:   less than 140 mg/dL      Peak postprandial:   less than 180 mg/dL (1-2 hours)      Critically ill patients:  140 - 180 mg/dL   Lab Results  Component Value Date   GLUCAP 238 (H) 05/01/2022   HGBA1C 12.1 (H) 04/28/2022    Review of Glycemic Control  Latest Reference Range & Units 04/30/22 09:48 04/30/22 12:01 04/30/22 16:21 05/01/22 07:39  Glucose-Capillary 70 - 99 mg/dL 277 (H) 318 (H) 114 (H) 238 (H)  (H): Data is abnormally high  Diabetes history: DM2 Outpatient Diabetes medications: Tresiba 30-50 unts QD, Metformin 1000 mg BID, Rybelsus 7 mg QD (not taking any meds for 2-3 months) Current orders for Inpatient glycemic control: Novolog 0-20 units TID and 0-5 units QHS, Novolog 6 units TID, Metformin 1000 mg BID  Inpatient Diabetes Program Recommendations:    -Please consider Semglee 15 units QD -Novolog 0-15 units TID -Novolog 6 units TID with meals if eats > 50%  Ordered Living Well with Diabetes booklet  Will continue to follow while inpatient.  Thank you, Reche Dixon, MSN, [place] Diabetes Coordinator Inpatient Diabetes Program [phone] (team pager from 8a-5p)

## 2022-05-02 DIAGNOSIS — F333 Major depressive disorder, recurrent, severe with psychotic symptoms: Secondary | ICD-10-CM | POA: Diagnosis not present

## 2022-05-02 LAB — GLUCOSE, CAPILLARY
Glucose-Capillary: 251 mg/dL — ABNORMAL HIGH (ref 70–99)
Glucose-Capillary: 242 mg/dL — ABNORMAL HIGH (ref 70–99)
Glucose-Capillary: 217 mg/dL — ABNORMAL HIGH (ref 70–99)
Glucose-Capillary: 168 mg/dL — ABNORMAL HIGH (ref 70–99)

## 2022-05-02 NOTE — Progress Notes (Signed)
[facility] MD Progress Note  05/02/2022 12:36 PM Bryan Flowers  MRN:  [id number] Subjective: Patient states that he slept well last night for the first time in a while.  I changed his as needed Ambien to trazodone and he had that last night along with an increase in his Seroquel.  He states that he feels much better today.  Principal Problem: MDD (major depressive disorder), recurrent, severe, with psychosis ([place]) Diagnosis: Principal Problem:   MDD (major depressive disorder), recurrent, severe, with psychosis ([place]) Active Problems:   MDD (major depressive disorder)  Total Time spent with patient: 15 minutes  Past Psychiatric History:  He is currently seeing a psychiatrist in [place] at [facility].  He has done well on Seroquel, Cymbalta, and Lamictal and takes Ambien as needed.  He has one visit to the emergency room back in 2018 for depression.  No past admissions.  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   Depression    Diabetes mellitus without complication (HCC)    GERD (gastroesophageal reflux disease)    Hyperlipidemia    Hypertension    Insomnia    Parkinson disease (HCC)    Peptic ulcer disease with hemorrhage    Sleep apnea    on cpap    Past Surgical History:  Procedure Laterality Date   ESOPHAGOGASTRODUODENOSCOPY Left 03/16/2016   Procedure: ESOPHAGOGASTRODUODENOSCOPY (EGD);  Surgeon: Manus Gunning, MD;  Location: [facility];  Service: Gastroenterology;  Laterality: Left;   FOOT FRACTURE SURGERY Right    Family History:  Family History  Problem Relation Age of Onset   Cancer Mother    Heart disease Father    Arthritis Brother    Colon polyps Neg Hx    Colon cancer Neg Hx    Esophageal cancer Neg Hx    Rectal cancer  Neg Hx    Stomach cancer Neg Hx     Social History:  Social History   Substance and Sexual Activity  Alcohol Use No   Alcohol/week: 0.0 standard drinks of alcohol     Social History   Substance and Sexual Activity  Drug Use No    Social History   Socioeconomic History   Marital status: Widowed    Spouse name: Not on file   Number of children: 0   Years of education: Not on file   Highest education level: High school graduate  Occupational History    Employer: DISABLED  Tobacco Use   Smoking status: Never   Smokeless tobacco: Never   Tobacco comments:    Non-smoker  Vaping Use   Vaping Use: Never used  Substance and Sexual Activity   Alcohol use: No    Alcohol/week: 0.0 standard drinks of alcohol   Drug use: No   Sexual activity: Not Currently    Partners: Female  Other Topics Concern   Not on file  Social History Narrative   Pt lives alone.    Social Determinants of Health   Financial Resource Strain: Low Risk  (02/25/2021)   Overall Financial Resource Strain (CARDIA)    Difficulty of Paying Living Expenses: Not hard at all  Food Insecurity: No Food Insecurity (02/25/2021)   Hunger Vital Sign    Worried About Running Out of Food  in the Last Year: Never true    [place] in the Last Year: Never true  Transportation Needs: No Transportation Needs (02/25/2021)   PRAPARE - Hydrologist (Medical): No    Lack of Transportation (Non-Medical): No  Physical Activity: Sufficiently Active (02/25/2021)   Exercise Vital Sign    Days of Exercise per Week: 7 days    Minutes of Exercise per Session: 30 min  Stress: No Stress Concern Present (02/25/2021)   [place]    Feeling of Stress : Only a little  Social Connections: Moderately Isolated (02/25/2021)   Social Connection and Isolation Panel [NHANES]    Frequency of Communication with Friends and Family: More than three  times a week    Frequency of Social Gatherings with Friends and Family: More than three times a week    Attends Religious Services: More than 4 times per year    Active Member of [employer] or Organizations: No    Attends Archivist Meetings: Never    Marital Status: Widowed   Additional Social History:                         Sleep: Good  Appetite:  Good  Current Medications: Current Facility-Administered Medications  Medication Dose Route Frequency Provider Last Rate Last Admin   acetaminophen (TYLENOL) tablet 650 mg  650 mg Oral Q6H PRN Waldon Merl F, NP   650 mg at 05/01/22 0904   alum & mag hydroxide-simeth (MAALOX/MYLANTA) 200-200-20 MG/5ML suspension 30 mL  30 mL Oral Q4H PRN Waldon Merl F, NP       amLODipine (NORVASC) tablet 10 mg  10 mg Oral Daily Waldon Merl F, NP   10 mg at 05/02/22 9518   carbidopa-levodopa (SINEMET CR) 50-200 MG per tablet controlled release 1 tablet  1 tablet Oral BID Parks Ranger, DO   1 tablet at 05/02/22 8416   DULoxetine (CYMBALTA) DR capsule 60 mg  60 mg Oral Daily Parks Ranger, DO   60 mg at 05/02/22 6063   ergocalciferol (VITAMIN D2) (DRISDOL) 8000 units/mL (200 mcg/mL) drops 4,000 Units  4,000 Units Oral Daily Parks Ranger, DO   4,000 Units at 05/02/22 0160   gabapentin (NEURONTIN) capsule 300 mg  300 mg Oral TID Parks Ranger, DO   300 mg at 05/02/22 1093   insulin aspart (novoLOG) injection 0-15 Units  0-15 Units Subcutaneous TID WC Parks Ranger, DO   7 Units at 05/02/22 1158   insulin aspart (novoLOG) injection 6 Units  6 Units Subcutaneous TID WC Parks Ranger, DO   6 Units at 05/02/22 1158   insulin glargine-yfgn [facility]) injection 15 Units  15 Units Subcutaneous Daily Parks Ranger, DO   15 Units at 05/02/22 2355   lamoTRIgine (LAMICTAL) tablet 400 mg  400 mg Oral QHS Parks Ranger, DO   400 mg at 05/01/22 2117   lisinopril  (ZESTRIL) tablet 20 mg  20 mg Oral Daily Waldon Merl F, NP   20 mg at 05/02/22 7322   magnesium hydroxide (MILK OF MAGNESIA) suspension 30 mL  30 mL Oral Daily PRN Waldon Merl F, NP       metFORMIN (GLUCOPHAGE) tablet 1,000 mg  1,000 mg Oral BID WC Waldon Merl F, NP   1,000 mg at 05/02/22 0801   multivitamin with minerals tablet 1 tablet  1 tablet  Oral Daily Parks Ranger, DO   1 tablet at 05/02/22 0254   propranolol ER (INDERAL LA) 24 hr capsule 60 mg  60 mg Oral QHS Parks Ranger, DO   60 mg at 05/01/22 2119   QUEtiapine (SEROQUEL) tablet 400 mg  400 mg Oral QHS Parks Ranger, DO   400 mg at 05/01/22 2119   rOPINIRole (REQUIP) tablet 2 mg  2 mg Oral QHS Parks Ranger, DO   2 mg at 05/01/22 2117   rosuvastatin (CRESTOR) tablet 40 mg  40 mg Oral Daily Parks Ranger, DO   40 mg at 05/02/22 2706   traZODone (DESYREL) tablet 100 mg  100 mg Oral QHS PRN Parks Ranger, DO   100 mg at 05/01/22 2118    Lab Results:  Results for orders placed or performed during the hospital encounter of 04/30/22 (from the past 48 hour(s))  Glucose, capillary     Status: Abnormal   Collection Time: 05/01/22  7:39 AM  Result Value Ref Range   Glucose-Capillary 238 (H) 70 - 99 mg/dL    Comment: Glucose reference range applies only to samples taken after fasting for at least 8 hours.  Glucose, capillary     Status: Abnormal   Collection Time: 05/01/22 11:53 AM  Result Value Ref Range   Glucose-Capillary 359 (H) 70 - 99 mg/dL    Comment: Glucose reference range applies only to samples taken after fasting for at least 8 hours.  Glucose, capillary     Status: Abnormal   Collection Time: 05/01/22  4:14 PM  Result Value Ref Range   Glucose-Capillary 268 (H) 70 - 99 mg/dL    Comment: Glucose reference range applies only to samples taken after fasting for at least 8 hours.  Glucose, capillary     Status: Abnormal   Collection Time: 05/01/22  8:21  PM  Result Value Ref Range   Glucose-Capillary 237 (H) 70 - 99 mg/dL    Comment: Glucose reference range applies only to samples taken after fasting for at least 8 hours.  Glucose, capillary     Status: Abnormal   Collection Time: 05/02/22  7:59 AM  Result Value Ref Range   Glucose-Capillary 251 (H) 70 - 99 mg/dL    Comment: Glucose reference range applies only to samples taken after fasting for at least 8 hours.  Glucose, capillary     Status: Abnormal   Collection Time: 05/02/22 11:55 AM  Result Value Ref Range   Glucose-Capillary 242 (H) 70 - 99 mg/dL    Comment: Glucose reference range applies only to samples taken after fasting for at least 8 hours.    Blood Alcohol level:  Lab Results  Component Value Date   ETH <10 04/28/2022   ETH <5 23/76/2831    Metabolic Disorder Labs: Lab Results  Component Value Date   HGBA1C 12.1 (H) 04/28/2022   MPG 300.57 04/28/2022   MPG 280 09/15/2021   No results found for: "PROLACTIN" Lab Results  Component Value Date   CHOL 247 (H) 09/15/2021   TRIG 307 (H) 09/15/2021   HDL 44 09/15/2021   CHOLHDL 5.6 (H) 09/15/2021   LDLCALC 156 (H) 09/15/2021   LDLCALC 129 (H) 05/15/2021    Physical Findings: AIMS:  , ,  ,  ,    CIWA:    COWS:     Musculoskeletal: Strength & Muscle Tone: within normal limits Gait & Station: normal Patient leans: N/A  Psychiatric Specialty Exam:  Presentation  General Appearance: Appropriate for Environment  Eye Contact:Good  Speech:Clear and Coherent  Speech Volume:Normal  Handedness:No data recorded  Mood and Affect  Mood:Depressed  Affect:Congruent   Thought Process  Thought Processes:Coherent  Descriptions of Associations:Intact  Orientation:Full (Time, Place and Person)  Thought Content:WDL  History of Schizophrenia/Schizoaffective disorder:No  Duration of Psychotic Symptoms:No data recorded Hallucinations:No data recorded Ideas of Reference:None  Suicidal Thoughts:No data  recorded Homicidal Thoughts:No data recorded  Sensorium  Memory:Immediate Good; Recent Good; Remote Good  Judgment:Good  Insight:Good   Executive Functions  Concentration:Good  Attention Span:Good  [place]  Language:Good   Psychomotor Activity  Psychomotor Activity:No data recorded  Assets  Assets:Communication Skills; Desire for Improvement; Financial Resources/Insurance; Housing; Resilience   Sleep  Sleep:No data recorded   Physical Exam: Physical Exam ROS Blood pressure (!) 99/59, pulse 71, temperature (!) 97.5 F (36.4 C), resp. rate 18, height '5\' 11"'$  (1.803 m), weight 81.2 kg, SpO2 100 %. Body mass index is 24.97 kg/m.   Treatment Plan Summary: Daily contact with patient to assess and evaluate symptoms and progress in treatment, Medication management, and Plan continue current medications.  Parks Ranger, DO 05/02/2022, 12:36 PM

## 2022-05-02 NOTE — Progress Notes (Signed)
Patient is seen in the dayroom at the beginning of the shift. He is AAOx4, calm, and cooperative. He denies SI/ HI and AVH at this time. He denied feelings of anxiety and depression. He stated his concerns with sleep and stated he has not slept in a couple of days due to working night shift. Pt given Trazodone 100 mg as per MAR orders which was effective. He is medication complaint and voiced no other complaints. Q 15 min safety checks in place.

## 2022-05-02 NOTE — Progress Notes (Signed)
Patient is seen in the dayroom with brother at the beginning of the shift. He denies SI/ HI and AVH. He denies anxiety and depression. He denied pain. He is AAOx4. He voiced no complaints to this Probation officer. Q 15 minute safety checks.

## 2022-05-02 NOTE — Progress Notes (Signed)
Patient is alert and oriented x4. He denies any pain at present. Denies SI/HI/AVH. Pt reports he is feeling better since being here. Has been medication compliant and eating all meals appropriately. Pt talks with other patients in the milleu and is polite. Ambulates independently. No abnormal movements noted. Will continue q4mn checks and provide verbal encouragement as needed.

## 2022-05-02 NOTE — Plan of Care (Signed)
  Problem: Education: Goal: Knowledge of General Education information will improve Description: Including pain rating scale, medication(s)/side effects and non-pharmacologic comfort measures Outcome: Progressing   Problem: Health Behavior/Discharge Planning: Goal: Ability to manage health-related needs will improve Outcome: Progressing   Problem: Clinical Measurements: Goal: Ability to maintain clinical measurements within normal limits will improve Outcome: Progressing Goal: Will remain free from infection Outcome: Progressing Goal: Diagnostic test results will improve Outcome: Progressing Goal: Respiratory complications will improve Outcome: Progressing Goal: Cardiovascular complication will be avoided Outcome: Progressing   Problem: Activity: Goal: Risk for activity intolerance will decrease Outcome: Progressing   Problem: Nutrition: Goal: Adequate nutrition will be maintained Outcome: Progressing   Problem: Coping: Goal: Level of anxiety will decrease Outcome: Progressing   Problem: Elimination: Goal: Will not experience complications related to bowel motility Outcome: Progressing Goal: Will not experience complications related to urinary retention Outcome: Progressing   Problem: Pain Managment: Goal: General experience of comfort will improve Outcome: Progressing   Problem: Safety: Goal: Ability to remain free from injury will improve Outcome: Progressing   Problem: Skin Integrity: Goal: Risk for impaired skin integrity will decrease Outcome: Progressing   Problem: Education: Goal: Knowledge of [place] General Education information/materials will improve Outcome: Progressing Goal: Emotional status will improve Outcome: Progressing Goal: Mental status will improve Outcome: Progressing Goal: Verbalization of understanding the information provided will improve Outcome: Progressing   Problem: Activity: Goal: Interest or engagement in activities will  improve Outcome: Progressing Goal: Sleeping patterns will improve Outcome: Progressing   Problem: Coping: Goal: Ability to verbalize frustrations and anger appropriately will improve Outcome: Progressing Goal: Ability to demonstrate self-control will improve Outcome: Progressing   Problem: Health Behavior/Discharge Planning: Goal: Identification of resources available to assist in meeting health care needs will improve Outcome: Progressing Goal: Compliance with treatment plan for underlying cause of condition will improve Outcome: Progressing   Problem: Physical Regulation: Goal: Ability to maintain clinical measurements within normal limits will improve Outcome: Progressing   Problem: Safety: Goal: Periods of time without injury will increase Outcome: Progressing   Problem: Education: Goal: Utilization of techniques to improve thought processes will improve Outcome: Progressing Goal: Knowledge of the prescribed therapeutic regimen will improve Outcome: Progressing   Problem: Activity: Goal: Interest or engagement in leisure activities will improve Outcome: Progressing Goal: Imbalance in normal sleep/wake cycle will improve Outcome: Progressing   Problem: Coping: Goal: Coping ability will improve Outcome: Progressing Goal: Will verbalize feelings Outcome: Progressing   Problem: Health Behavior/Discharge Planning: Goal: Ability to make decisions will improve Outcome: Progressing Goal: Compliance with therapeutic regimen will improve Outcome: Progressing   Problem: Role Relationship: Goal: Will demonstrate positive changes in social behaviors and relationships Outcome: Progressing   Problem: Safety: Goal: Ability to disclose and discuss suicidal ideas will improve Outcome: Progressing Goal: Ability to identify and utilize support systems that promote safety will improve Outcome: Progressing   Problem: Self-Concept: Goal: Will verbalize positive feelings about  self Outcome: Progressing Goal: Level of anxiety will decrease Outcome: Progressing   Problem: Education: Goal: Ability to state activities that reduce stress will improve Outcome: Progressing   Problem: Coping: Goal: Ability to identify and develop effective coping behavior will improve Outcome: Progressing   Problem: Self-Concept: Goal: Ability to identify factors that promote anxiety will improve Outcome: Progressing Goal: Level of anxiety will decrease Outcome: Progressing Goal: Ability to modify response to factors that promote anxiety will improve Outcome: Progressing

## 2022-05-02 NOTE — Group Note (Signed)
LCSW Group Therapy Note  Group Date: 05/02/2022 Start Time: 1330 End Time: 1415   Type of Therapy and Topic:  Group Therapy - How To Cope with Nervousness about Discharge   Participation Level:  Minimal   Description of Group This process group involved identification of patients' feelings about discharge. Some of them are scheduled to be discharged soon, while others are new admissions, but each of them was asked to share thoughts and feelings surrounding discharge from the hospital. One common theme was that they are excited at the prospect of going home, while another was that many of them are apprehensive about sharing why they were hospitalized. Patients were given the opportunity to discuss these feelings with their peers in preparation for discharge.  Therapeutic Goals  Patient will identify their overall feelings about pending discharge. Patient will think about how they might proactively address issues that they believe will once again arise once they get home (i.e. with parents). Patients will participate in discussion about having hope for change.   Summary of Patient Progress:  Patient was present for the entirety of the group session. Patient was an active listener and participated in the topic of discussion, provided helpful advice to others, and added nuance to topic of conversation. He said that his mood had improved and was feeling "a little anxious" to go home but was ok for the most part.     Therapeutic Modalities Cognitive Behavioral Therapy   Ido Wollman A [place], LCSWA 05/02/2022  3:29 PM

## 2022-05-03 ENCOUNTER — Inpatient Hospital Stay: Payer: PPO

## 2022-05-03 DIAGNOSIS — F333 Major depressive disorder, recurrent, severe with psychotic symptoms: Secondary | ICD-10-CM | POA: Diagnosis not present

## 2022-05-03 LAB — CBC
HCT: 39.3 % (ref 39.0–52.0)
Hemoglobin: 13 g/dL (ref 13.0–17.0)
MCH: 29.1 pg (ref 26.0–34.0)
MCHC: 33.1 g/dL (ref 30.0–36.0)
MCV: 88.1 fL (ref 80.0–100.0)
Platelets: 240 10*3/uL (ref 150–400)
RBC: 4.46 MIL/uL (ref 4.22–5.81)
RDW: 12.5 % (ref 11.5–15.5)
WBC: 6.7 10*3/uL (ref 4.0–10.5)
nRBC: 0 % (ref 0.0–0.2)

## 2022-05-03 LAB — COMPREHENSIVE METABOLIC PANEL
ALT: 5 U/L (ref 0–44)
AST: 14 U/L — ABNORMAL LOW (ref 15–41)
Albumin: 3.7 g/dL (ref 3.5–5.0)
Alkaline Phosphatase: 51 U/L (ref 38–126)
Anion gap: 10 (ref 5–15)
BUN: 33 mg/dL — ABNORMAL HIGH (ref 6–20)
CO2: 23 mmol/L (ref 22–32)
Calcium: 9 mg/dL (ref 8.9–10.3)
Chloride: 103 mmol/L (ref 98–111)
Creatinine, Ser: 1.23 mg/dL (ref 0.61–1.24)
GFR, Estimated: >60 mL/min (ref >60)
Glucose, Bld: 135 mg/dL — ABNORMAL HIGH (ref 70–99)
Potassium: 4.3 mmol/L (ref 3.5–5.1)
Sodium: 136 mmol/L (ref 135–145)
Total Bilirubin: 0.8 mg/dL (ref 0.3–1.2)
Total Protein: 6 g/dL — ABNORMAL LOW (ref 6.5–8.1)

## 2022-05-03 LAB — BLOOD GAS, ARTERIAL
Acid-base deficit: 2.9 mmol/L — ABNORMAL HIGH (ref 0.0–2.0)
Bicarbonate: 22.6 mmol/L (ref 20.0–28.0)
O2 Saturation: 98.8 %
Patient temperature: 37
pCO2 arterial: 41 mmHg (ref 32–48)
pH, Arterial: 7.35 (ref 7.35–7.45)
pO2, Arterial: 92 mmHg (ref 83–108)

## 2022-05-03 LAB — GLUCOSE, CAPILLARY
Glucose-Capillary: 174 mg/dL — ABNORMAL HIGH (ref 70–99)
Glucose-Capillary: 211 mg/dL — ABNORMAL HIGH (ref 70–99)
Glucose-Capillary: 181 mg/dL — ABNORMAL HIGH (ref 70–99)
Glucose-Capillary: 127 mg/dL — ABNORMAL HIGH (ref 70–99)
Glucose-Capillary: 129 mg/dL — ABNORMAL HIGH (ref 70–99)

## 2022-05-03 MED ORDER — SODIUM CHLORIDE 0.9 % IV BOLUS
1000.0000 mL | Freq: Once | INTRAVENOUS | Status: AC
  Administered 2022-05-03: 1000 mL via INTRAVENOUS

## 2022-05-03 MED ORDER — QUETIAPINE FUMARATE 100 MG PO TABS
200.0000 mg | ORAL_TABLET | Freq: Every day | ORAL | Status: DC
  Administered 2022-05-04 – 2022-05-07 (×4): 200 mg via ORAL
  Filled 2022-05-03 (×4): qty 2

## 2022-05-03 MED ORDER — TRAZODONE HCL 50 MG PO TABS
50.0000 mg | ORAL_TABLET | Freq: Every evening | ORAL | Status: DC | PRN
  Administered 2022-05-05: 50 mg via ORAL
  Filled 2022-05-03: qty 1

## 2022-05-03 NOTE — Progress Notes (Signed)
       CROSS COVER NOTE  NAME: Bryan Flowers MRN: [id number] DOB : 1961/07/06    Date of Service   05/03/22  HPI/Events of Note   Responded to overhead RAPID RESPONSE Medical Alert in [place]. Nursing reports he was found in the day room difficult to arouse and confused. He was pale and required sternum rub to arouse. Both very different from his baseline. Per report he has not had any medication changes today.   [name] is alert on my arrival to [place] day room. He is oriented to self, time, and situation. He is not oriented to place. He tells me he is in a "rest home." No focal deficits appreciated on exam.  Lungs are clear to auscultation. S1, S2 heart sounds present, rate is regular.  CBG 127  Manual BP 78/48 SPO2 98% HR 78 Afebrile  2335: Dr Louis Meckel updated via phone, does not want to formally consult Hospitalist group at this time.   Interventions   Plan: 1L NS CT Head WO CMP, CBC, ABG EKG     This document was prepared using Dragon voice recognition software and may include unintentional dictation errors.  Neomia Glass DNP, MHA, FNP-BC Nurse Practitioner [facility] Pager [phone]

## 2022-05-03 NOTE — BH Assessment (Signed)
1920 Received patient walking up the hallway for a visit in the day room. He has a bright affect. 2130 BS 129  2145 MHT was trying to awaken patient. This writer went over and found patient unresponsive and pale. He did not responded to his name being called and this Probation officer. Direction given to the MHT to call a rapid response while this writer continued to  stabilize patient with juice. BS re-checked and it is now 129. He is in and out of consciousness but is responding when he is called. He is not oriented he stated that today was Friday and it was 2013.   2157 Code team arrived on the along with NP K. Foust who assumed medical care of patient. VS BP 78/55 PShe ordered a liter of NS to be infused now, EKG, ABGs, lab work, and a CT scan of the head.   BP 113/59 P 54  he is alert but sluggish, but responding to conversation. Patient transferred from day room chair to a wheel chair his legs are shaky and buckling he is almost a max assist with two people for safety.  2300 Patient off floor to have a CT scan done and this writer accompanied to to the CT dept. Additional lab work drawn. Patient continues to be unsafe with ambulating, showing generalized weakness and unsteady gait.  .  2325 Returned to the unit and asked patient to transfer from transport chair to geri-chair and he was not able to make the transition and need max assist to transfer. Patient is extremely weak. He is still confused but is able to recognize that he is not his usual self. Patient at the nurse's station for close observation.  0530 Patient is closely back to himself before the medical event. He is alert and oriented to He was taken to his room and walked to the bathroom. His legs are still weak but they are not wobbly as  before.   0630 Patient is resting quietly in bed his VS are BP 99/63 65 R 16 02 Sat 98.   0730 Report has been given to the oncoming nurse and a follow-up Troponin was drawn at 0720. 2300 Troponin was up at  18.

## 2022-05-03 NOTE — Plan of Care (Signed)
Patient alert, oriented and able to make needs known. Patient compliant with all medications and procedures on the unit. Denies any SI/HI/AVH at present. He reports he overall feels much better and his anxiety,depression and sleeping has improved since admission. He denies being in any pain right now. BG was 174 this morning. Blood glucose readings and blood pressure readings have improved since admission too. Pt states he feels better. Pt is resting in  his room with no distress. No EPS symptoms noted. Rates his anxiety and depression a 2/10. Will continue q2mn checks and provide verbal encouragement as needed.   Problem: Nutrition: Goal: Adequate nutrition will be maintained Outcome: Progressing   Problem: Coping: Goal: Level of anxiety will decrease Outcome: Progressing   Problem: Education: Goal: Mental status will improve Outcome: Progressing   Problem: Activity: Goal: Interest or engagement in activities will improve Outcome: Progressing Goal: Sleeping patterns will improve Outcome: Progressing   Problem: Coping: Goal: Ability to verbalize frustrations and anger appropriately will improve Outcome: Progressing Goal: Ability to demonstrate self-control will improve Outcome: Progressing

## 2022-05-03 NOTE — Progress Notes (Signed)
[facility] MD Progress Note  05/03/2022 3:04 PM Bryan Flowers  MRN:  [id number] Subjective: Bryan Flowers continues to do well.  He states that he slept well.  He has been compliant with his medications and denies any side effects.  He states his mood is improving.  Principal Problem: MDD (major depressive disorder), recurrent, severe, with psychosis ([place]) Diagnosis: Principal Problem:   MDD (major depressive disorder), recurrent, severe, with psychosis ([place]) Active Problems:   MDD (major depressive disorder)  Total Time spent with patient: 15 minutes  Past Psychiatric History:  He is currently seeing a psychiatrist in [place] at [facility].  He has done well on Seroquel, Cymbalta, and Lamictal and takes Ambien as needed.  He has one visit to the emergency room back in 2018 for depression.  No past admissions.  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   Depression    Diabetes mellitus without complication (HCC)    GERD (gastroesophageal reflux disease)    Hyperlipidemia    Hypertension    Insomnia    Parkinson disease (HCC)    Peptic ulcer disease with hemorrhage    Sleep apnea    on cpap    Past Surgical History:  Procedure Laterality Date   ESOPHAGOGASTRODUODENOSCOPY Left 03/16/2016   Procedure: ESOPHAGOGASTRODUODENOSCOPY (EGD);  Surgeon: Manus Gunning, MD;  Location: [place];  Service: Gastroenterology;  Laterality: Left;   FOOT FRACTURE SURGERY Right    Family History:  Family History  Problem Relation Age of Onset   Cancer Mother    Heart disease Father    Arthritis Brother    Colon polyps Neg Hx    Colon cancer Neg Hx    Esophageal cancer Neg Hx    Rectal cancer Neg Hx    Stomach cancer Neg Hx     Social History:  Social  History   Substance and Sexual Activity  Alcohol Use No   Alcohol/week: 0.0 standard drinks of alcohol     Social History   Substance and Sexual Activity  Drug Use No    Social History   Socioeconomic History   Marital status: Widowed    Spouse name: Not on file   Number of children: 0   Years of education: Not on file   Highest education level: High school graduate  Occupational History    Employer: DISABLED  Tobacco Use   Smoking status: Never   Smokeless tobacco: Never   Tobacco comments:    Non-smoker  Vaping Use   Vaping Use: Never used  Substance and Sexual Activity   Alcohol use: No    Alcohol/week: 0.0 standard drinks of alcohol   Drug use: No   Sexual activity: Not Currently    Partners: Female  Other Topics Concern   Not on file  Social History Narrative   Pt lives alone.    Social Determinants of Health   Financial Resource Strain: Low Risk  (02/25/2021)   Overall Financial Resource Strain (CARDIA)    Difficulty of Paying Living Expenses: Not hard at all  Food Insecurity: No Food Insecurity (02/25/2021)   Hunger Vital Sign    Worried About Running Out of Food in the Last Year: Never true    Ran Out of Food in  the Last Year: Never true  Transportation Needs: No Transportation Needs (02/25/2021)   PRAPARE - Hydrologist (Medical): No    Lack of Transportation (Non-Medical): No  Physical Activity: Sufficiently Active (02/25/2021)   Exercise Vital Sign    Days of Exercise per Week: 7 days    Minutes of Exercise per Session: 30 min  Stress: No Stress Concern Present (02/25/2021)   [place]    Feeling of Stress : Only a little  Social Connections: Moderately Isolated (02/25/2021)   Social Connection and Isolation Panel [NHANES]    Frequency of Communication with Friends and Family: More than three times a week    Frequency of Social Gatherings with Friends and  Family: More than three times a week    Attends Religious Services: More than 4 times per year    Active Member of [employer] or Organizations: No    Attends Archivist Meetings: Never    Marital Status: Widowed   Additional Social History:                         Sleep: Good  Appetite:  Good  Current Medications: Current Facility-Administered Medications  Medication Dose Route Frequency Provider Last Rate Last Admin   acetaminophen (TYLENOL) tablet 650 mg  650 mg Oral Q6H PRN Waldon Merl F, NP   650 mg at 05/01/22 0904   alum & mag hydroxide-simeth (MAALOX/MYLANTA) 200-200-20 MG/5ML suspension 30 mL  30 mL Oral Q4H PRN Waldon Merl F, NP       amLODipine (NORVASC) tablet 10 mg  10 mg Oral Daily Waldon Merl F, NP   10 mg at 05/03/22 0848   carbidopa-levodopa (SINEMET CR) 50-200 MG per tablet controlled release 1 tablet  1 tablet Oral BID Parks Ranger, DO   1 tablet at 05/03/22 0855   DULoxetine (CYMBALTA) DR capsule 60 mg  60 mg Oral Daily Parks Ranger, DO   60 mg at 05/03/22 0848   ergocalciferol (VITAMIN D2) (DRISDOL) 8000 units/mL (200 mcg/mL) drops 4,000 Units  4,000 Units Oral Daily Parks Ranger, DO   4,000 Units at 05/03/22 8182   gabapentin (NEURONTIN) capsule 300 mg  300 mg Oral TID Parks Ranger, DO   300 mg at 05/03/22 0848   insulin aspart (novoLOG) injection 0-15 Units  0-15 Units Subcutaneous TID WC Parks Ranger, DO   7 Units at 05/03/22 1150   insulin aspart (novoLOG) injection 6 Units  6 Units Subcutaneous TID WC Parks Ranger, DO   6 Units at 05/03/22 1150   insulin glargine-yfgn [facility]) injection 15 Units  15 Units Subcutaneous Daily Parks Ranger, DO   15 Units at 05/03/22 9937   lamoTRIgine (LAMICTAL) tablet 400 mg  400 mg Oral QHS Parks Ranger, DO   400 mg at 05/02/22 2111   lisinopril (ZESTRIL) tablet 20 mg  20 mg Oral Daily Waldon Merl F, NP   20 mg  at 05/03/22 0848   magnesium hydroxide (MILK OF MAGNESIA) suspension 30 mL  30 mL Oral Daily PRN Waldon Merl F, NP       metFORMIN (GLUCOPHAGE) tablet 1,000 mg  1,000 mg Oral BID WC Waldon Merl F, NP   1,000 mg at 05/03/22 0759   multivitamin with minerals tablet 1 tablet  1 tablet Oral Daily Parks Ranger, DO   1 tablet at 05/03/22 [phone]  propranolol ER (INDERAL LA) 24 hr capsule 60 mg  60 mg Oral QHS Parks Ranger, DO   60 mg at 05/02/22 2111   QUEtiapine (SEROQUEL) tablet 400 mg  400 mg Oral QHS Parks Ranger, DO   400 mg at 05/02/22 2111   rOPINIRole (REQUIP) tablet 2 mg  2 mg Oral QHS Parks Ranger, DO   2 mg at 05/02/22 2111   rosuvastatin (CRESTOR) tablet 40 mg  40 mg Oral Daily Parks Ranger, DO   40 mg at 05/03/22 0848   traZODone (DESYREL) tablet 100 mg  100 mg Oral QHS PRN Parks Ranger, DO   100 mg at 05/02/22 2110    Lab Results:  Results for orders placed or performed during the hospital encounter of 04/30/22 (from the past 48 hour(s))  Glucose, capillary     Status: Abnormal   Collection Time: 05/01/22  4:14 PM  Result Value Ref Range   Glucose-Capillary 268 (H) 70 - 99 mg/dL    Comment: Glucose reference range applies only to samples taken after fasting for at least 8 hours.  Glucose, capillary     Status: Abnormal   Collection Time: 05/01/22  8:21 PM  Result Value Ref Range   Glucose-Capillary 237 (H) 70 - 99 mg/dL    Comment: Glucose reference range applies only to samples taken after fasting for at least 8 hours.  Glucose, capillary     Status: Abnormal   Collection Time: 05/02/22  7:59 AM  Result Value Ref Range   Glucose-Capillary 251 (H) 70 - 99 mg/dL    Comment: Glucose reference range applies only to samples taken after fasting for at least 8 hours.  Glucose, capillary     Status: Abnormal   Collection Time: 05/02/22 11:55 AM  Result Value Ref Range   Glucose-Capillary 242 (H) 70 - 99 mg/dL     Comment: Glucose reference range applies only to samples taken after fasting for at least 8 hours.  Glucose, capillary     Status: Abnormal   Collection Time: 05/02/22  4:31 PM  Result Value Ref Range   Glucose-Capillary 217 (H) 70 - 99 mg/dL    Comment: Glucose reference range applies only to samples taken after fasting for at least 8 hours.  Glucose, capillary     Status: Abnormal   Collection Time: 05/02/22  8:07 PM  Result Value Ref Range   Glucose-Capillary 168 (H) 70 - 99 mg/dL    Comment: Glucose reference range applies only to samples taken after fasting for at least 8 hours.  Glucose, capillary     Status: Abnormal   Collection Time: 05/03/22  7:53 AM  Result Value Ref Range   Glucose-Capillary 174 (H) 70 - 99 mg/dL    Comment: Glucose reference range applies only to samples taken after fasting for at least 8 hours.  Glucose, capillary     Status: Abnormal   Collection Time: 05/03/22 11:47 AM  Result Value Ref Range   Glucose-Capillary 211 (H) 70 - 99 mg/dL    Comment: Glucose reference range applies only to samples taken after fasting for at least 8 hours.    Blood Alcohol level:  Lab Results  Component Value Date   [facility] <10 04/28/2022   ETH <5 90/24/0973    Metabolic Disorder Labs: Lab Results  Component Value Date   HGBA1C 12.1 (H) 04/28/2022   MPG 300.57 04/28/2022   MPG 280 09/15/2021   No results found for: "PROLACTIN" Lab Results  Component Value Date   CHOL 247 (H) 09/15/2021   TRIG 307 (H) 09/15/2021   HDL 44 09/15/2021   CHOLHDL 5.6 (H) 09/15/2021   LDLCALC 156 (H) 09/15/2021   LDLCALC 129 (H) 05/15/2021    Physical Findings: AIMS:  , ,  ,  ,    CIWA:    COWS:     Musculoskeletal: Strength & Muscle Tone: within normal limits Gait & Station: normal Patient leans: N/A  Psychiatric Specialty Exam:  Presentation  General Appearance: Appropriate for Environment  Eye Contact:Good  Speech:Clear and Coherent  Speech  Volume:Normal  Handedness:No data recorded  Mood and Affect  Mood:Depressed  Affect:Congruent   Thought Process  Thought Processes:Coherent  Descriptions of Associations:Intact  Orientation:Full (Time, Place and Person)  Thought Content:WDL  History of Schizophrenia/Schizoaffective disorder:No  Duration of Psychotic Symptoms:No data recorded Hallucinations:No data recorded Ideas of Reference:None  Suicidal Thoughts:No data recorded Homicidal Thoughts:No data recorded  Sensorium  Memory:Immediate Good; Recent Good; Remote Good  Judgment:Good  Insight:Good   Executive Functions  Concentration:Good  Attention Span:Good  [place] of Knowledge:Good  Language:Good   Psychomotor Activity  Psychomotor Activity:No data recorded  Assets  Assets:Communication Skills; Desire for Improvement; Financial Resources/Insurance; Housing; Resilience   Sleep  Sleep:No data recorded   Physical Exam: Physical Exam ROS Blood pressure 127/85, pulse 75, temperature (!) 97.4 F (36.3 C), temperature source Oral, resp. rate 18, height '5\' 11"'$  (1.803 m), weight 81.2 kg, SpO2 100 %. Body mass index is 24.97 kg/m.   Treatment Plan Summary: Daily contact with patient to assess and evaluate symptoms and progress in treatment, Medication management, and Plan continue current medications.  Parks Ranger, DO 05/03/2022, 3:04 PM

## 2022-05-04 DIAGNOSIS — F333 Major depressive disorder, recurrent, severe with psychotic symptoms: Secondary | ICD-10-CM | POA: Diagnosis not present

## 2022-05-04 LAB — GLUCOSE, CAPILLARY
Glucose-Capillary: 148 mg/dL — ABNORMAL HIGH (ref 70–99)
Glucose-Capillary: 194 mg/dL — ABNORMAL HIGH (ref 70–99)
Glucose-Capillary: 157 mg/dL — ABNORMAL HIGH (ref 70–99)

## 2022-05-04 LAB — TROPONIN I (HIGH SENSITIVITY)
Troponin I (High Sensitivity): 18 ng/L — ABNORMAL HIGH (ref <18)
Troponin I (High Sensitivity): 17 ng/L (ref <18)

## 2022-05-04 NOTE — Plan of Care (Signed)
Patient presents A&Ox4. Steady gait and denied any pain this morning from last night's event or currently. Patient states he doesn't know what truly happened and states he did not feel any pain last night but did feel very drowsy and felt like he could hear everything going on but was not able to respond. Patients skin assessment done and no ecchymosis noted on chest from sternal rub. Patients skin intact. Patient denies SI, HI, and A/V/H with no plan/intent. Patient has been demonstrating adequate appetite and med compliant. Labs drawn this morning. Patient has been absent from any s/s of current distress and absent from any injuries.    Problem: Education: Goal: Knowledge of General Education information will improve Description: Including pain rating scale, medication(s)/side effects and non-pharmacologic comfort measures Outcome: Progressing   Problem: Nutrition: Goal: Adequate nutrition will be maintained Outcome: Progressing   Problem: Safety: Goal: Ability to remain free from injury will improve Outcome: Progressing

## 2022-05-04 NOTE — Progress Notes (Addendum)
Name: Bryan Flowers MRN: [id number] DOB: Oct 07, 1960     Subjective: Responded to overhead Medical Alert for RAPID RESPONSE in Gerops[facility]. Nursing reports Bryan Flowers was found in the day room difficult to arouse and confused. He was pale and required sternum rub to rouse. Significant change from less than 1 hour ago. Per report he has not had any medication changes today. CBG 127. Manual BP 78/48. HR 78.   Objective: Bryan Flowers is a 61 yo gentleman admitted to [facility] 04/30/22. Please see H&P for details of admission and history.  Today's Vitals   05/03/22 0802 05/03/22 0848 05/03/22 1556 05/03/22 2028  BP: (!) 127/59 127/85 114/78 135/74  Pulse: 75  79 73  Resp: '18  18 18  '$ Temp: (!) 97.4 F (36.3 C)  98.1 F (36.7 C) (!) 97.5 F (36.4 C)  TempSrc: Oral     SpO2: 100%  100% 100%  Weight:      Height:      PainSc: 0-No pain      Body mass index is 24.97 kg/m.   CBG (last 3)  Recent Labs    05/03/22 1635 05/03/22 2153 05/03/22 2208  GLUCAP 181* 129* 127*     CBC    Component Value Date/Time   WBC 6.7 05/03/2022 2310   RBC 4.46 05/03/2022 2310   HGB 13.0 05/03/2022 2310   HCT 39.3 05/03/2022 2310   PLT 240 05/03/2022 2310   MCV 88.1 05/03/2022 2310   MCH 29.1 05/03/2022 2310   MCHC 33.1 05/03/2022 2310   RDW 12.5 05/03/2022 2310   LYMPHSABS 1,168 05/15/2021 1447   MONOABS 536 08/20/2016 1122   EOSABS 73 05/15/2021 1447   BASOSABS 40 05/15/2021 1447      Latest Ref Rng & Units 05/03/2022   11:10 PM 04/28/2022    8:37 PM 09/15/2021    2:07 PM  BMP  Glucose 70 - 99 mg/dL 135  311  499   BUN 6 - 20 mg/dL 33  13  19   Creatinine 0.61 - 1.24 mg/dL 1.23  0.97  0.95   BUN/Creat Ratio 6 - 22 (calc)   NOT APPLICABLE   Sodium 094 - 145 mmol/L 136  134  136   Potassium 3.5 - 5.1 mmol/L 4.3  5.6  5.0   Chloride 98 - 111 mmol/L 103  101  99   CO2 22 - 32 mmol/L '23  25  29   '$ Calcium 8.9 - 10.3 mg/dL 9.0  9.5  9.4    ABG    Component Value Date/Time   PHART  7.35 05/03/2022 2229   PCO2ART 41 05/03/2022 2229   PO2ART 92 05/03/2022 2229   HCO3 22.6 05/03/2022 2229   ACIDBASEDEF 2.9 (H) 05/03/2022 2229   O2SAT 98.8 05/03/2022 2229   Cardiac Panel (last 3 results) Recent Labs    05/03/22 2310  TROPONINIHS 18*     CT HEAD WO CONTRAST (5MM)  Result Date: 05/03/2022 CLINICAL DATA:  Altered mental status. EXAM: CT HEAD WITHOUT CONTRAST TECHNIQUE: Contiguous axial images were obtained from the base of the skull through the vertex without intravenous contrast. RADIATION DOSE REDUCTION: This exam was performed according to the departmental dose-optimization program which includes automated exposure control, adjustment of the mA and/or kV according to patient size and/or use of iterative reconstruction technique. COMPARISON:  None Available. FINDINGS: Brain: Mild age-related atrophy and chronic microvascular ischemic changes. There is no acute intracranial hemorrhage. No mass effect or midline shift.  No extra-axial fluid collection. Vascular: No hyperdense vessel or unexpected calcification. Skull: Normal. Negative for fracture or focal lesion. Sinuses/Orbits: No acute finding. Other: None IMPRESSION: 1. No acute intracranial pathology. 2. Mild age-related atrophy and chronic microvascular ischemic changes. Electronically Signed   By: Anner Crete M.D.   On: 05/03/2022 23:16      Physical Exam:   General: Adult male, pale, sitting in chair in day room HEENT: MM pink/moist, anicteric, atraumatic, neck supple Neuro: A&O x self, time and situation. Disoriented to place(states he is in a "rest home"), follows commands, PERRL, No focal deficits appreciated CV: S1, S2, regular rate and rhythm, no rub /murmur/gallop Pulm: Regular, non labored on room air , breath sounds clear-BUL & clear-BLL GI: soft, non-distended, non-tender, Bowel sounds active x4 GU: deferred Skin: warm/dry; no rashes/lesions noted Extremities: Radial Pulses + 1 bilaterally, no edema  noted  Review of Systems  Constitutional:  Negative for diaphoresis.  Respiratory:  Negative for shortness of breath.   Cardiovascular:  Negative for chest pain and palpitations.  Gastrointestinal:  Negative for abdominal pain, heartburn, nausea and vomiting.  Neurological:  Positive for dizziness and weakness. Negative for focal weakness.  Psychiatric/Behavioral:  Positive for depression.      Assessment/Plan:   Hypotension       - 1L NS  Altered Mental Staus       - CMP, CBC, ABG, Troponin       - EKG        - CT Head WO- negative  3. Elevated Troponin- 18       - Mild elevation in Troponin, pt is chest pain free. See ROS above.       - Recheck with AM labs   2225: Dr Louis Meckel updated via phone  2355: Dr Louis Meckel updated via phone, does not want to formally consult Hospitalist group at this time, plan to observe patient and make medication changes. BP 113/59 HR 69; Bryan Flowers is back to his baseline per nursing. At this time he is alert and oriented x4, able to stay awake and participate in conversation. Denies dizziness, lightheadedness, or fatigue at this time.    This document was prepared using Dragon voice recognition software and may include unintentional dictation errors.  Neomia Glass DNP, MHA, FNP-BC Nurse Practitioner [facility] Pager [phone]

## 2022-05-04 NOTE — Progress Notes (Signed)
[facility] MD Progress Note  05/04/2022 10:58 AM Bryan Flowers  MRN:  [id number] Subjective: Bryan Flowers was doing well yesterday up until he had a rapid response call because he was unresponsive.  He tells me today that he feels better and fine and he remembers what happened.  He states that he basically froze into his brain and he could hear everything and follow commands but just could not move.  His CT of his head was negative and his lab work was all within normal limits.  I decreased his Seroquel to 200 mg last night and he states that he slept well and that he feels pretty good this morning.  It sounds like he was dehydrated but he is drinking water and felt much better after receiving IV fluids.  I also discontinued his nighttime Lamictal.  And nurses have held his blood pressure medicine until it comes up a little bit more.  It is stable at 119/68.  Principal Problem: MDD (major depressive disorder), recurrent, severe, with psychosis ([place]) Diagnosis: Principal Problem:   MDD (major depressive disorder), recurrent, severe, with psychosis ([place]) Active Problems:   MDD (major depressive disorder)  Total Time spent with patient: 15 minutes  Past Psychiatric History:  He is currently seeing a psychiatrist in [place] at [facility].  He has done well on Seroquel, Cymbalta, and Lamictal and takes Ambien as needed.  He has one visit to the emergency room back in 2018 for depression.  No past admissions.    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/2017   Depression    Diabetes mellitus without complication (HCC)    GERD (gastroesophageal reflux disease)    Hyperlipidemia    Hypertension    Insomnia    Parkinson disease (HCC)    Peptic ulcer disease with hemorrhage     Sleep apnea    on cpap    Past Surgical History:  Procedure Laterality Date   ESOPHAGOGASTRODUODENOSCOPY Left 03/16/2016   Procedure: ESOPHAGOGASTRODUODENOSCOPY (EGD);  Surgeon: Manus Gunning, MD;  Location: [place];  Service: Gastroenterology;  Laterality: Left;   FOOT FRACTURE SURGERY Right    Family History:  Family History  Problem Relation Age of Onset   Cancer Mother    Heart disease Father    Arthritis Brother    Colon polyps Neg Hx    Colon cancer Neg Hx    Esophageal cancer Neg Hx    Rectal cancer Neg Hx    Stomach cancer Neg Hx     Social History:  Social History   Substance and Sexual Activity  Alcohol Use No   Alcohol/week: 0.0 standard drinks of alcohol     Social History   Substance and Sexual Activity  Drug Use No    Social History   Socioeconomic History   Marital status: Widowed    Spouse name: Not on file   Number of children: 0   Years of education: Not on file   Highest education level: High school graduate  Occupational History    Employer: DISABLED  Tobacco Use   Smoking status: Never   Smokeless tobacco: Never   Tobacco comments:    Non-smoker  Vaping Use   Vaping Use: Never used  Substance and Sexual Activity  Alcohol use: No    Alcohol/week: 0.0 standard drinks of alcohol   Drug use: No   Sexual activity: Not Currently    Partners: Female  Other Topics Concern   Not on file  Social History Narrative   Pt lives alone.    Social Determinants of Health   Financial Resource Strain: Low Risk  (02/25/2021)   Overall Financial Resource Strain (CARDIA)    Difficulty of Paying Living Expenses: Not hard at all  Food Insecurity: No Food Insecurity (02/25/2021)   Hunger Vital Sign    Worried About Running Out of Food in the Last Year: Never true    Ran Out of Food in the Last Year: Never true  Transportation Needs: No Transportation Needs (02/25/2021)   PRAPARE - Hydrologist (Medical): No     Lack of Transportation (Non-Medical): No  Physical Activity: Sufficiently Active (02/25/2021)   Exercise Vital Sign    Days of Exercise per Week: 7 days    Minutes of Exercise per Session: 30 min  Stress: No Stress Concern Present (02/25/2021)   [place]    Feeling of Stress : Only a little  Social Connections: Moderately Isolated (02/25/2021)   Social Connection and Isolation Panel [NHANES]    Frequency of Communication with Friends and Family: More than three times a week    Frequency of Social Gatherings with Friends and Family: More than three times a week    Attends Religious Services: More than 4 times per year    Active Member of [employer] or Organizations: No    Attends Archivist Meetings: Never    Marital Status: Widowed   Additional Social History:                         Sleep: Good  Appetite:  Good  Current Medications: Current Facility-Administered Medications  Medication Dose Route Frequency Provider Last Rate Last Admin   acetaminophen (TYLENOL) tablet 650 mg  650 mg Oral Q6H PRN Waldon Merl F, NP   650 mg at 05/01/22 0904   alum & mag hydroxide-simeth (MAALOX/MYLANTA) 200-200-20 MG/5ML suspension 30 mL  30 mL Oral Q4H PRN Waldon Merl F, NP       amLODipine (NORVASC) tablet 10 mg  10 mg Oral Daily Waldon Merl F, NP   10 mg at 05/03/22 0848   carbidopa-levodopa (SINEMET CR) 50-200 MG per tablet controlled release 1 tablet  1 tablet Oral BID Parks Ranger, DO   1 tablet at 05/04/22 0940   DULoxetine (CYMBALTA) DR capsule 60 mg  60 mg Oral Daily Parks Ranger, DO   60 mg at 05/04/22 0940   ergocalciferol (VITAMIN D2) (DRISDOL) 8000 units/mL (200 mcg/mL) drops 4,000 Units  4,000 Units Oral Daily Parks Ranger, DO   4,000 Units at 05/04/22 0940   insulin aspart (novoLOG) injection 0-15 Units  0-15 Units Subcutaneous TID WC Parks Ranger,  DO   3 Units at 05/04/22 0900   insulin aspart (novoLOG) injection 6 Units  6 Units Subcutaneous TID WC Parks Ranger, DO   6 Units at 05/04/22 0900   insulin glargine-[facility]) injection 15 Units  15 Units Subcutaneous Daily Parks Ranger, DO   15 Units at 05/04/22 0932   lisinopril (ZESTRIL) tablet 20 mg  20 mg Oral Daily Waldon Merl F, NP   20 mg at 05/03/22 0848   magnesium  hydroxide (MILK OF MAGNESIA) suspension 30 mL  30 mL Oral Daily PRN Waldon Merl F, NP       metFORMIN (GLUCOPHAGE) tablet 1,000 mg  1,000 mg Oral BID WC Waldon Merl F, NP   1,000 mg at 05/04/22 0900   multivitamin with minerals tablet 1 tablet  1 tablet Oral Daily Parks Ranger, DO   1 tablet at 05/04/22 0940   propranolol ER (INDERAL LA) 24 hr capsule 60 mg  60 mg Oral QHS Parks Ranger, DO   60 mg at 05/03/22 2124   QUEtiapine (SEROQUEL) tablet 200 mg  200 mg Oral QHS Parks Ranger, DO       rOPINIRole (REQUIP) tablet 2 mg  2 mg Oral QHS Parks Ranger, DO   2 mg at 05/03/22 2126   rosuvastatin (CRESTOR) tablet 40 mg  40 mg Oral Daily Parks Ranger, DO   40 mg at 05/04/22 0940   traZODone (DESYREL) tablet 50 mg  50 mg Oral QHS PRN Parks Ranger, DO        Lab Results:  Results for orders placed or performed during the hospital encounter of 04/30/22 (from the past 48 hour(s))  Glucose, capillary     Status: Abnormal   Collection Time: 05/02/22 11:55 AM  Result Value Ref Range   Glucose-Capillary 242 (H) 70 - 99 mg/dL    Comment: Glucose reference range applies only to samples taken after fasting for at least 8 hours.  Glucose, capillary     Status: Abnormal   Collection Time: 05/02/22  4:31 PM  Result Value Ref Range   Glucose-Capillary 217 (H) 70 - 99 mg/dL    Comment: Glucose reference range applies only to samples taken after fasting for at least 8 hours.  Glucose, capillary     Status: Abnormal   Collection Time:  05/02/22  8:07 PM  Result Value Ref Range   Glucose-Capillary 168 (H) 70 - 99 mg/dL    Comment: Glucose reference range applies only to samples taken after fasting for at least 8 hours.  Glucose, capillary     Status: Abnormal   Collection Time: 05/03/22  7:53 AM  Result Value Ref Range   Glucose-Capillary 174 (H) 70 - 99 mg/dL    Comment: Glucose reference range applies only to samples taken after fasting for at least 8 hours.  Glucose, capillary     Status: Abnormal   Collection Time: 05/03/22 11:47 AM  Result Value Ref Range   Glucose-Capillary 211 (H) 70 - 99 mg/dL    Comment: Glucose reference range applies only to samples taken after fasting for at least 8 hours.  Glucose, capillary     Status: Abnormal   Collection Time: 05/03/22  4:35 PM  Result Value Ref Range   Glucose-Capillary 181 (H) 70 - 99 mg/dL    Comment: Glucose reference range applies only to samples taken after fasting for at least 8 hours.  Glucose, capillary     Status: Abnormal   Collection Time: 05/03/22  9:53 PM  Result Value Ref Range   Glucose-Capillary 129 (H) 70 - 99 mg/dL    Comment: Glucose reference range applies only to samples taken after fasting for at least 8 hours.  Glucose, capillary     Status: Abnormal   Collection Time: 05/03/22 10:08 PM  Result Value Ref Range   Glucose-Capillary 127 (H) 70 - 99 mg/dL    Comment: Glucose reference range applies only to samples taken after fasting for  at least 8 hours.  Blood gas, arterial     Status: Abnormal   Collection Time: 05/03/22 10:29 PM  Result Value Ref Range   Delivery systems ROOM AIR    pH, Arterial 7.35 7.35 - 7.45   pCO2 arterial 41 32 - 48 mmHg   pO2, Arterial 92 83 - 108 mmHg   Bicarbonate 22.6 20.0 - 28.0 mmol/L   Acid-base deficit 2.9 (H) 0.0 - 2.0 mmol/L   O2 Saturation 98.8 %   Patient temperature 37.0    Collection site RIGHT RADIAL    Allens test (pass/fail) PASS PASS    Comment: Performed at [facility], [address] [place], [place] [address]  Comprehensive metabolic panel     Status: Abnormal   Collection Time: 05/03/22 11:10 PM  Result Value Ref Range   Sodium 136 135 - 145 mmol/L   Potassium 4.3 3.5 - 5.1 mmol/L   Chloride 103 98 - 111 mmol/L   CO2 23 22 - 32 mmol/L   Glucose, Bld 135 (H) 70 - 99 mg/dL    Comment: Glucose reference range applies only to samples taken after fasting for at least 8 hours.   BUN 33 (H) 6 - 20 mg/dL   Creatinine, Ser 1.23 0.61 - 1.24 mg/dL   Calcium 9.0 8.9 - 10.3 mg/dL   Total Protein 6.0 (L) 6.5 - 8.1 g/dL   Albumin 3.7 3.5 - 5.0 g/dL   AST 14 (L) 15 - 41 U/L   ALT 5 0 - 44 U/L   Alkaline Phosphatase 51 38 - 126 U/L   Total Bilirubin 0.8 0.3 - 1.2 mg/dL   GFR, Estimated >60 >60 mL/min    Comment: (NOTE) Calculated using the CKD-EPI Creatinine Equation (2021)    Anion gap 10 5 - 15    Comment: Performed at [facility], [address] [place], [place] [address]  CBC     Status: None   Collection Time: 05/03/22 11:10 PM  Result Value Ref Range   WBC 6.7 4.0 - 10.5 K/uL   RBC 4.46 4.22 - 5.81 MIL/uL   Hemoglobin 13.0 13.0 - 17.0 g/dL   HCT 39.3 39.0 - 52.0 %   MCV 88.1 80.0 - 100.0 fL   MCH 29.1 26.0 - 34.0 pg   MCHC 33.1 30.0 - 36.0 g/dL   RDW 12.5 11.5 - 15.5 %   Platelets 240 150 - 400 K/uL   nRBC 0.0 0.0 - 0.2 %    Comment: Performed at [facility], [place], [place] [address]  Troponin I (High Sensitivity)     Status: Abnormal   Collection Time: 05/03/22 11:10 PM  Result Value Ref Range   Troponin I (High Sensitivity) 18 (H) <18 ng/L    Comment: (NOTE) Elevated high sensitivity troponin I (hsTnI) values and significant  changes across serial measurements may suggest ACS but many other  chronic and acute conditions are known to elevate hsTnI results.  Refer to the "Links" section for chest pain algorithms and additional  guidance. Performed at [facility], [place],   [address]   Troponin I (High Sensitivity)     Status: None   Collection Time: 05/04/22  7:24 AM  Result Value Ref Range   Troponin I (High Sensitivity) 17 <18 ng/L    Comment: (NOTE) Elevated high sensitivity troponin I (hsTnI) values and significant  changes across serial measurements may suggest ACS but many other  chronic and  acute conditions are known to elevate hsTnI results.  Refer to the "Links" section for chest pain algorithms and additional  guidance. Performed at [facility], [address] [place], [place] [address]   Glucose, capillary     Status: Abnormal   Collection Time: 05/04/22  7:58 AM  Result Value Ref Range   Glucose-Capillary 148 (H) 70 - 99 mg/dL    Comment: Glucose reference range applies only to samples taken after fasting for at least 8 hours.    Blood Alcohol level:  Lab Results  Component Value Date   ETH <10 04/28/2022   ETH <5 26/20/3559    Metabolic Disorder Labs: Lab Results  Component Value Date   HGBA1C 12.1 (H) 04/28/2022   MPG 300.57 04/28/2022   MPG 280 09/15/2021   No results found for: "PROLACTIN" Lab Results  Component Value Date   CHOL 247 (H) 09/15/2021   TRIG 307 (H) 09/15/2021   HDL 44 09/15/2021   CHOLHDL 5.6 (H) 09/15/2021   LDLCALC 156 (H) 09/15/2021   LDLCALC 129 (H) 05/15/2021    Physical Findings: AIMS:  , ,  ,  ,    CIWA:    COWS:     Musculoskeletal: Strength & Muscle Tone: within normal limits Gait & Station: normal Patient leans: N/A  Psychiatric Specialty Exam:  Presentation  General Appearance: Appropriate for Environment  Eye Contact:Good  Speech:Clear and Coherent  Speech Volume:Normal  Handedness:No data recorded  Mood and Affect  Mood:Depressed  Affect:Congruent   Thought Process  Thought Processes:Coherent  Descriptions of Associations:Intact  Orientation:Full (Time, Place and Person)  Thought Content:WDL  History of Schizophrenia/Schizoaffective  disorder:No  Duration of Psychotic Symptoms:No data recorded Hallucinations:No data recorded Ideas of Reference:None  Suicidal Thoughts:No data recorded Homicidal Thoughts:No data recorded  Sensorium  Memory:Immediate Good; Recent Good; Remote Good  Judgment:Good  Insight:Good   Executive Functions  Concentration:Good  Attention Span:Good  [place] of Knowledge:Good  Language:Good   Psychomotor Activity  Psychomotor Activity:No data recorded  Assets  Assets:Communication Skills; Desire for Improvement; Financial Resources/Insurance; Housing; Resilience   Sleep  Sleep:No data recorded   Physical Exam: Physical Exam Psychiatric:        Attention and Perception: Attention and perception normal.        Mood and Affect: Mood and affect normal.        Speech: Speech normal.        Behavior: Behavior normal. Behavior is cooperative.        Thought Content: Thought content normal.        Cognition and Memory: Cognition and memory normal.        Judgment: Judgment normal.    Review of Systems  Constitutional: Negative.   HENT: Negative.    Eyes: Negative.   Respiratory: Negative.    Cardiovascular: Negative.   Gastrointestinal: Negative.   Genitourinary: Negative.   Musculoskeletal: Negative.   Skin: Negative.   Neurological: Negative.   Endo/Heme/Allergies: Negative.   Psychiatric/Behavioral:  Positive for depression.    Blood pressure 119/68, pulse 65, temperature 97.9 F (36.6 C), resp. rate 14, height '5\' 11"'$  (1.803 m), weight 81.2 kg, SpO2 99 %. Body mass index is 24.97 kg/m.   Treatment Plan Summary: Daily contact with patient to assess and evaluate symptoms and progress in treatment, Medication management, and Plan continue current medications.  Parks Ranger, DO 05/04/2022, 10:58 AM

## 2022-05-05 DIAGNOSIS — F333 Major depressive disorder, recurrent, severe with psychotic symptoms: Secondary | ICD-10-CM | POA: Diagnosis not present

## 2022-05-05 LAB — GLUCOSE, CAPILLARY
Glucose-Capillary: 131 mg/dL — ABNORMAL HIGH (ref 70–99)
Glucose-Capillary: 128 mg/dL — ABNORMAL HIGH (ref 70–99)
Glucose-Capillary: 152 mg/dL — ABNORMAL HIGH (ref 70–99)
Glucose-Capillary: 167 mg/dL — ABNORMAL HIGH (ref 70–99)

## 2022-05-05 LAB — SARS CORONAVIRUS 2 (TAT 6-24 HRS): SARS Coronavirus 2: NEGATIVE

## 2022-05-05 MED ORDER — LIVING WELL WITH DIABETES BOOK
Freq: Once | Status: AC
  Administered 2022-05-05: 1
  Filled 2022-05-05: qty 1

## 2022-05-05 NOTE — Plan of Care (Signed)
  Problem: Education: Goal: Knowledge of General Education information will improve Description: Including pain rating scale, medication(s)/side effects and non-pharmacologic comfort measures Outcome: Progressing   Problem: Health Behavior/Discharge Planning: Goal: Ability to manage health-related needs will improve Outcome: Progressing   Problem: Clinical Measurements: Goal: Ability to maintain clinical measurements within normal limits will improve Outcome: Progressing Goal: Will remain free from infection Outcome: Progressing Goal: Diagnostic test results will improve Outcome: Progressing Goal: Respiratory complications will improve Outcome: Progressing Goal: Cardiovascular complication will be avoided Outcome: Progressing   Problem: Activity: Goal: Risk for activity intolerance will decrease Outcome: Progressing   Problem: Nutrition: Goal: Adequate nutrition will be maintained Outcome: Progressing   Problem: Coping: Goal: Level of anxiety will decrease Outcome: Progressing   Problem: Elimination: Goal: Will not experience complications related to bowel motility Outcome: Progressing Goal: Will not experience complications related to urinary retention Outcome: Progressing   Problem: Pain Managment: Goal: General experience of comfort will improve Outcome: Progressing   Problem: Safety: Goal: Ability to remain free from injury will improve Outcome: Progressing   Problem: Skin Integrity: Goal: Risk for impaired skin integrity will decrease Outcome: Progressing   Problem: Education: Goal: Knowledge of [place] General Education information/materials will improve Outcome: Progressing Goal: Emotional status will improve Outcome: Progressing Goal: Mental status will improve Outcome: Progressing Goal: Verbalization of understanding the information provided will improve Outcome: Progressing   Problem: Activity: Goal: Interest or engagement in activities will  improve Outcome: Progressing Goal: Sleeping patterns will improve Outcome: Progressing   Problem: Coping: Goal: Ability to verbalize frustrations and anger appropriately will improve Outcome: Progressing Goal: Ability to demonstrate self-control will improve Outcome: Progressing   Problem: Health Behavior/Discharge Planning: Goal: Identification of resources available to assist in meeting health care needs will improve Outcome: Progressing Goal: Compliance with treatment plan for underlying cause of condition will improve Outcome: Progressing   Problem: Physical Regulation: Goal: Ability to maintain clinical measurements within normal limits will improve Outcome: Progressing   Problem: Safety: Goal: Periods of time without injury will increase Outcome: Progressing   Problem: Education: Goal: Utilization of techniques to improve thought processes will improve Outcome: Progressing Goal: Knowledge of the prescribed therapeutic regimen will improve Outcome: Progressing   Problem: Activity: Goal: Interest or engagement in leisure activities will improve Outcome: Progressing Goal: Imbalance in normal sleep/wake cycle will improve Outcome: Progressing   Problem: Coping: Goal: Coping ability will improve Outcome: Progressing Goal: Will verbalize feelings Outcome: Progressing   Problem: Health Behavior/Discharge Planning: Goal: Ability to make decisions will improve Outcome: Progressing Goal: Compliance with therapeutic regimen will improve Outcome: Progressing   Problem: Role Relationship: Goal: Will demonstrate positive changes in social behaviors and relationships Outcome: Progressing   Problem: Safety: Goal: Ability to disclose and discuss suicidal ideas will improve Outcome: Progressing Goal: Ability to identify and utilize support systems that promote safety will improve Outcome: Progressing   Problem: Self-Concept: Goal: Will verbalize positive feelings about  self Outcome: Progressing Goal: Level of anxiety will decrease Outcome: Progressing   Problem: Education: Goal: Ability to state activities that reduce stress will improve Outcome: Progressing   Problem: Coping: Goal: Ability to identify and develop effective coping behavior will improve Outcome: Progressing   Problem: Self-Concept: Goal: Ability to identify factors that promote anxiety will improve Outcome: Progressing Goal: Level of anxiety will decrease Outcome: Progressing Goal: Ability to modify response to factors that promote anxiety will improve Outcome: Progressing

## 2022-05-05 NOTE — Progress Notes (Signed)
Patient is alert and oriented times 4. Mood and affect appropriate. Patient rates pain as 0/10. He denies SI, HI, and AVH. He does endorse feelings of anxiety and depression at this time. States he slept well last night. Evening meds given whole by mouth W/O difficulty. Patient ate snack in day room; appetite is fair. Patient remains on unit with Q15 minute checks in place.

## 2022-05-05 NOTE — Progress Notes (Signed)
Patient is alert and oriented times 4. Mood and affect appropriate. Patient denies pain. He denies SI, HI, and AVH. Also denies feelings of anxiety and depression at this time. States he slept good last night. Morning meds given whole by mouth W/O difficulty. Blood sugar covered with insulin as scheduled. Ate breakfast in day room- appetite good. Patient remains on unit with Q15 minute checks in place.

## 2022-05-05 NOTE — Progress Notes (Signed)
[facility] MD Progress Note  05/05/2022 11:28 AM Bryan Flowers  MRN:  [id number] Subjective: Bryan Flowers was seen on rounds.  He tells me that he slept well last night.  He states that his mood is improving.  He denies any side effects from his medications.  He has not had any fainting episodes.  No signs or symptoms of URI.  Principal Problem: MDD (major depressive disorder), recurrent, severe, with psychosis ([place]) Diagnosis: Principal Problem:   MDD (major depressive disorder), recurrent, severe, with psychosis ([place]) Active Problems:   MDD (major depressive disorder)  Total Time spent with patient: 15 minutes  Past Psychiatric History:  He is currently seeing a psychiatrist in [place] at [facility].  He has done well on Seroquel, Cymbalta, and Lamictal and takes Ambien as needed.  He has one visit to the emergency room back in 2018 for depression.  No past admissions.  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/2017   Depression    Diabetes mellitus without complication (HCC)    GERD (gastroesophageal reflux disease)    Hyperlipidemia    Hypertension    Insomnia    Parkinson disease (HCC)    Peptic ulcer disease with hemorrhage    Sleep apnea    on cpap    Past Surgical History:  Procedure Laterality Date   ESOPHAGOGASTRODUODENOSCOPY Left 03/16/2016   Procedure: ESOPHAGOGASTRODUODENOSCOPY (EGD);  Surgeon: Manus Gunning, MD;  Location: [facility];  Service: Gastroenterology;  Laterality: Left;   FOOT FRACTURE SURGERY Right    Family History:  Family History  Problem Relation Age of Onset   Cancer Mother    Heart disease Father    Arthritis Brother    Colon polyps Neg Hx    Colon cancer Neg Hx    Esophageal cancer Neg Hx    Rectal cancer Neg  Hx    Stomach cancer Neg Hx    Social History:  Social History   Substance and Sexual Activity  Alcohol Use No   Alcohol/week: 0.0 standard drinks of alcohol     Social History   Substance and Sexual Activity  Drug Use No    Social History   Socioeconomic History   Marital status: Widowed    Spouse name: Not on file   Number of children: 0   Years of education: Not on file   Highest education level: High school graduate  Occupational History    Employer: DISABLED  Tobacco Use   Smoking status: Never   Smokeless tobacco: Never   Tobacco comments:    Non-smoker  Vaping Use   Vaping Use: Never used  Substance and Sexual Activity   Alcohol use: No    Alcohol/week: 0.0 standard drinks of alcohol   Drug use: No   Sexual activity: Not Currently    Partners: Female  Other Topics Concern   Not on file  Social History Narrative   Pt lives alone.    Social Determinants of Health   Financial Resource Strain: Low Risk  (02/25/2021)   Overall Financial Resource Strain (CARDIA)    Difficulty of Paying Living Expenses: Not hard at all  Food Insecurity: No Food Insecurity (02/25/2021)   Hunger Vital Sign    Worried About Running Out of Food  in the Last Year: Never true    [place] in the Last Year: Never true  Transportation Needs: No Transportation Needs (02/25/2021)   PRAPARE - Hydrologist (Medical): No    Lack of Transportation (Non-Medical): No  Physical Activity: Sufficiently Active (02/25/2021)   Exercise Vital Sign    Days of Exercise per Week: 7 days    Minutes of Exercise per Session: 30 min  Stress: No Stress Concern Present (02/25/2021)   [place]    Feeling of Stress : Only a little  Social Connections: Moderately Isolated (02/25/2021)   Social Connection and Isolation Panel [NHANES]    Frequency of Communication with Friends and Family: More than three times a  week    Frequency of Social Gatherings with Friends and Family: More than three times a week    Attends Religious Services: More than 4 times per year    Active Member of [employer] or Organizations: No    Attends Archivist Meetings: Never    Marital Status: Widowed   Additional Social History:                         Sleep: Good  Appetite:  Good  Current Medications: Current Facility-Administered Medications  Medication Dose Route Frequency Provider Last Rate Last Admin   acetaminophen (TYLENOL) tablet 650 mg  650 mg Oral Q6H PRN Waldon Merl F, NP   650 mg at 05/01/22 0904   alum & mag hydroxide-simeth (MAALOX/MYLANTA) 200-200-20 MG/5ML suspension 30 mL  30 mL Oral Q4H PRN Waldon Merl F, NP       amLODipine (NORVASC) tablet 10 mg  10 mg Oral Daily Waldon Merl F, NP   10 mg at 05/05/22 0850   carbidopa-levodopa (SINEMET CR) 50-200 MG per tablet controlled release 1 tablet  1 tablet Oral BID Parks Ranger, DO   1 tablet at 05/05/22 0846   DULoxetine (CYMBALTA) DR capsule 60 mg  60 mg Oral Daily Parks Ranger, DO   60 mg at 05/05/22 8119   ergocalciferol (VITAMIN D2) (DRISDOL) 8000 units/mL (200 mcg/mL) drops 4,000 Units  4,000 Units Oral Daily Parks Ranger, DO   4,000 Units at 05/05/22 0846   insulin aspart (novoLOG) injection 0-15 Units  0-15 Units Subcutaneous TID WC Parks Ranger, DO   3 Units at 05/05/22 0847   insulin aspart (novoLOG) injection 6 Units  6 Units Subcutaneous TID WC Parks Ranger, DO   6 Units at 05/05/22 0855   insulin glargine-yfgn [facility]) injection 15 Units  15 Units Subcutaneous Daily Parks Ranger, DO   15 Units at 05/05/22 0845   lisinopril (ZESTRIL) tablet 20 mg  20 mg Oral Daily Waldon Merl F, NP   20 mg at 05/05/22 1478   magnesium hydroxide (MILK OF MAGNESIA) suspension 30 mL  30 mL Oral Daily PRN Waldon Merl F, NP       metFORMIN (GLUCOPHAGE) tablet 1,000  mg  1,000 mg Oral BID WC Waldon Merl F, NP   1,000 mg at 05/05/22 0849   multivitamin with minerals tablet 1 tablet  1 tablet Oral Daily Parks Ranger, DO   1 tablet at 05/05/22 0851   propranolol ER (INDERAL LA) 24 hr capsule 60 mg  60 mg Oral QHS Parks Ranger, DO   60 mg at 05/04/22 2135   QUEtiapine (SEROQUEL) tablet 200  mg  200 mg Oral QHS Parks Ranger, DO   200 mg at 05/04/22 2135   rOPINIRole (REQUIP) tablet 2 mg  2 mg Oral QHS Parks Ranger, DO   2 mg at 05/04/22 2135   rosuvastatin (CRESTOR) tablet 40 mg  40 mg Oral Daily Parks Ranger, DO   40 mg at 05/05/22 0850   traZODone (DESYREL) tablet 50 mg  50 mg Oral QHS PRN Parks Ranger, DO        Lab Results:  Results for orders placed or performed during the hospital encounter of 04/30/22 (from the past 48 hour(s))  Glucose, capillary     Status: Abnormal   Collection Time: 05/03/22 11:47 AM  Result Value Ref Range   Glucose-Capillary 211 (H) 70 - 99 mg/dL    Comment: Glucose reference range applies only to samples taken after fasting for at least 8 hours.  Glucose, capillary     Status: Abnormal   Collection Time: 05/03/22  4:35 PM  Result Value Ref Range   Glucose-Capillary 181 (H) 70 - 99 mg/dL    Comment: Glucose reference range applies only to samples taken after fasting for at least 8 hours.  Glucose, capillary     Status: Abnormal   Collection Time: 05/03/22  9:53 PM  Result Value Ref Range   Glucose-Capillary 129 (H) 70 - 99 mg/dL    Comment: Glucose reference range applies only to samples taken after fasting for at least 8 hours.  Glucose, capillary     Status: Abnormal   Collection Time: 05/03/22 10:08 PM  Result Value Ref Range   Glucose-Capillary 127 (H) 70 - 99 mg/dL    Comment: Glucose reference range applies only to samples taken after fasting for at least 8 hours.  Blood gas, arterial     Status: Abnormal   Collection Time: 05/03/22 10:29 PM   Result Value Ref Range   Delivery systems ROOM AIR    pH, Arterial 7.35 7.35 - 7.45   pCO2 arterial 41 32 - 48 mmHg   pO2, Arterial 92 83 - 108 mmHg   Bicarbonate 22.6 20.0 - 28.0 mmol/L   Acid-base deficit 2.9 (H) 0.0 - 2.0 mmol/L   O2 Saturation 98.8 %   Patient temperature 37.0    Collection site RIGHT RADIAL    Allens test (pass/fail) PASS PASS    Comment: Performed at [facility], [address] [place], [place] [address]  Comprehensive metabolic panel     Status: Abnormal   Collection Time: 05/03/22 11:10 PM  Result Value Ref Range   Sodium 136 135 - 145 mmol/L   Potassium 4.3 3.5 - 5.1 mmol/L   Chloride 103 98 - 111 mmol/L   CO2 23 22 - 32 mmol/L   Glucose, Bld 135 (H) 70 - 99 mg/dL    Comment: Glucose reference range applies only to samples taken after fasting for at least 8 hours.   BUN 33 (H) 6 - 20 mg/dL   Creatinine, Ser 1.23 0.61 - 1.24 mg/dL   Calcium 9.0 8.9 - 10.3 mg/dL   Total Protein 6.0 (L) 6.5 - 8.1 g/dL   Albumin 3.7 3.5 - 5.0 g/dL   AST 14 (L) 15 - 41 U/L   ALT 5 0 - 44 U/L   Alkaline Phosphatase 51 38 - 126 U/L   Total Bilirubin 0.8 0.3 - 1.2 mg/dL   GFR, Estimated >60 >60 mL/min    Comment: (NOTE) Calculated using the CKD-EPI Creatinine  Equation (2021)    Anion gap 10 5 - 15    Comment: Performed at [facility], [address] [place], [place] [address]  CBC     Status: None   Collection Time: 05/03/22 11:10 PM  Result Value Ref Range   WBC 6.7 4.0 - 10.5 K/uL   RBC 4.46 4.22 - 5.81 MIL/uL   Hemoglobin 13.0 13.0 - 17.0 g/dL   HCT 39.3 39.0 - 52.0 %   MCV 88.1 80.0 - 100.0 fL   MCH 29.1 26.0 - 34.0 pg   MCHC 33.1 30.0 - 36.0 g/dL   RDW 12.5 11.5 - 15.5 %   Platelets 240 150 - 400 K/uL   nRBC 0.0 0.0 - 0.2 %    Comment: Performed at [facility], [place], [place] [address]  Troponin I (High Sensitivity)     Status: Abnormal   Collection Time: 05/03/22 11:10 PM  Result Value Ref Range    Troponin I (High Sensitivity) 18 (H) <18 ng/L    Comment: (NOTE) Elevated high sensitivity troponin I (hsTnI) values and significant  changes across serial measurements may suggest ACS but many other  chronic and acute conditions are known to elevate hsTnI results.  Refer to the "Links" section for chest pain algorithms and additional  guidance. Performed at [facility], [place], [place] [address]   Troponin I (High Sensitivity)     Status: None   Collection Time: 05/04/22  7:24 AM  Result Value Ref Range   Troponin I (High Sensitivity) 17 <18 ng/L    Comment: (NOTE) Elevated high sensitivity troponin I (hsTnI) values and significant  changes across serial measurements may suggest ACS but many other  chronic and acute conditions are known to elevate hsTnI results.  Refer to the "Links" section for chest pain algorithms and additional  guidance. Performed at [facility], [address] [place], [place] [address]   Glucose, capillary     Status: Abnormal   Collection Time: 05/04/22  7:58 AM  Result Value Ref Range   Glucose-Capillary 148 (H) 70 - 99 mg/dL    Comment: Glucose reference range applies only to samples taken after fasting for at least 8 hours.  Glucose, capillary     Status: Abnormal   Collection Time: 05/04/22 11:41 AM  Result Value Ref Range   Glucose-Capillary 194 (H) 70 - 99 mg/dL    Comment: Glucose reference range applies only to samples taken after fasting for at least 8 hours.  Glucose, capillary     Status: Abnormal   Collection Time: 05/04/22  4:03 PM  Result Value Ref Range   Glucose-Capillary 157 (H) 70 - 99 mg/dL    Comment: Glucose reference range applies only to samples taken after fasting for at least 8 hours.  Glucose, capillary     Status: Abnormal   Collection Time: 05/05/22  8:02 AM  Result Value Ref Range   Glucose-Capillary 131 (H) 70 - 99 mg/dL    Comment: Glucose reference range applies only to samples  taken after fasting for at least 8 hours.    Blood Alcohol level:  Lab Results  Component Value Date   [facility] <10 04/28/2022   ETH <5 76/19/5093    Metabolic Disorder Labs: Lab Results  Component Value Date   HGBA1C 12.1 (H) 04/28/2022   MPG 300.57 04/28/2022   MPG 280 09/15/2021   No results found for: "PROLACTIN" Lab Results  Component Value Date  CHOL 247 (H) 09/15/2021   TRIG 307 (H) 09/15/2021   HDL 44 09/15/2021   CHOLHDL 5.6 (H) 09/15/2021   LDLCALC 156 (H) 09/15/2021   LDLCALC 129 (H) 05/15/2021    Physical Findings: AIMS:  , ,  ,  ,    CIWA:    COWS:     Musculoskeletal: Strength & Muscle Tone: within normal limits Gait & Station: normal Patient leans: N/A  Psychiatric Specialty Exam:  Presentation  General Appearance: Appropriate for Environment  Eye Contact:Good  Speech:Clear and Coherent  Speech Volume:Normal  Handedness:No data recorded  Mood and Affect  Mood:Depressed  Affect:Congruent   Thought Process  Thought Processes:Coherent  Descriptions of Associations:Intact  Orientation:Full (Time, Place and Person)  Thought Content:WDL  History of Schizophrenia/Schizoaffective disorder:No  Duration of Psychotic Symptoms:No data recorded Hallucinations:No data recorded Ideas of Reference:None  Suicidal Thoughts:No data recorded Homicidal Thoughts:No data recorded  Sensorium  Memory:Immediate Good; Recent Good; Remote Good  Judgment:Good  Insight:Good   Executive Functions  Concentration:Good  Attention Span:Good  [place] of Knowledge:Good  Language:Good   Psychomotor Activity  Psychomotor Activity:No data recorded  Assets  Assets:Communication Skills; Desire for Improvement; Financial Resources/Insurance; Housing; Resilience   Sleep  Sleep:No data recorded   Physical Exam: Physical Exam Vitals and nursing note reviewed.  Constitutional:      Appearance: Normal appearance. He is normal weight.   Neurological:     General: No focal deficit present.     Mental Status: He is alert and oriented to person, place, and time.  Psychiatric:        Attention and Perception: Attention and perception normal.        Mood and Affect: Mood is anxious and depressed.        Speech: Speech normal.        Behavior: Behavior normal. Behavior is cooperative.        Thought Content: Thought content normal.        Cognition and Memory: Cognition and memory normal.        Judgment: Judgment normal.    Review of Systems  Constitutional: Negative.   HENT: Negative.    Eyes: Negative.   Respiratory: Negative.    Cardiovascular: Negative.   Gastrointestinal: Negative.   Genitourinary: Negative.   Musculoskeletal: Negative.   Skin: Negative.   Neurological: Negative.   Endo/Heme/Allergies: Negative.   Psychiatric/Behavioral:  Positive for depression.    Blood pressure 137/84, pulse 72, temperature 98.1 F (36.7 C), resp. rate 18, height '5\' 11"'$  (1.803 m), weight 81.2 kg, SpO2 98 %. Body mass index is 24.97 kg/m.   Treatment Plan Summary: Daily contact with patient to assess and evaluate symptoms and progress in treatment, Medication management, and Plan continue current medications.  Parks Ranger, DO 05/05/2022, 11:28 AM

## 2022-05-06 DIAGNOSIS — F333 Major depressive disorder, recurrent, severe with psychotic symptoms: Secondary | ICD-10-CM | POA: Diagnosis not present

## 2022-05-06 LAB — GLUCOSE, CAPILLARY
Glucose-Capillary: 144 mg/dL — ABNORMAL HIGH (ref 70–99)
Glucose-Capillary: 217 mg/dL — ABNORMAL HIGH (ref 70–99)
Glucose-Capillary: 198 mg/dL — ABNORMAL HIGH (ref 70–99)

## 2022-05-06 NOTE — Group Note (Addendum)
[place] LCSW Group Therapy Note   Group Date: 05/06/2022 Start Time: 1430 End Time: 1435   Type of Therapy/Topic:  Group Therapy:  Emotion Regulation  Participation Level:  Did Not Attend   Mood:  Description of Group:    The purpose of this group is to assist patients in learning to regulate negative emotions and experience positive emotions. Patients will be guided to discuss ways in which they have been vulnerable to their negative emotions. These vulnerabilities will be juxtaposed with experiences of positive emotions or situations, and patients challenged to use positive emotions to combat negative ones. Special emphasis will be placed on coping with negative emotions in conflict situations, and patients will process healthy conflict resolution skills.  Therapeutic Goals: Patient will identify two positive emotions or experiences to reflect on in order to balance out negative emotions:  Patient will label two or more emotions that they find the most difficult to experience:  Patient will be able to demonstrate positive conflict resolution skills through discussion or role plays:   Summary of Patient Progress:   Patient was unable to attend group due to isolation. CSW provided pt with therapeutic worksheets.     Therapeutic Modalities:   Cognitive Behavioral Therapy Feelings Identification Dialectical Behavioral Therapy   Christin Mccreedy A [place], LCSWA

## 2022-05-06 NOTE — Progress Notes (Signed)
Patient pleasant and cooperative with care. In room all of shift due to contact precautions. Patient in no distress. Voiced no complaints or concerns. Reports feeling much better now.  Encouragement and support provided. Safety checks maintained. Medications given as prescribed. Pt receptive and remains safe on unit with q 15 min checks.

## 2022-05-06 NOTE — BH IP Treatment Plan (Signed)
Interdisciplinary Treatment and Diagnostic Plan Update  05/06/2022 Time of Session: 9:30AM Bryan Flowers MRN: [id number]  Principal Diagnosis: MDD (major depressive disorder), recurrent, severe, with psychosis ([place])  Secondary Diagnoses: Principal Problem:   MDD (major depressive disorder), recurrent, severe, with psychosis ([place]) Active Problems:   MDD (major depressive disorder)   Current Medications:  Current Facility-Administered Medications  Medication Dose Route Frequency Provider Last Rate Last Admin   acetaminophen (TYLENOL) tablet 650 mg  650 mg Oral Q6H PRN Waldon Merl F, NP   650 mg at 05/06/22 0821   alum & mag hydroxide-simeth (MAALOX/MYLANTA) 200-200-20 MG/5ML suspension 30 mL  30 mL Oral Q4H PRN Waldon Merl F, NP       amLODipine (NORVASC) tablet 10 mg  10 mg Oral Daily Waldon Merl F, NP   10 mg at 05/06/22 2751   carbidopa-levodopa (SINEMET CR) 50-200 MG per tablet controlled release 1 tablet  1 tablet Oral BID Parks Ranger, DO   1 tablet at 05/06/22 0819   DULoxetine (CYMBALTA) DR capsule 60 mg  60 mg Oral Daily Parks Ranger, DO   60 mg at 05/06/22 7001   ergocalciferol (VITAMIN D2) (DRISDOL) 8000 units/mL (200 mcg/mL) drops 4,000 Units  4,000 Units Oral Daily Parks Ranger, DO   4,000 Units at 05/06/22 0820   insulin aspart (novoLOG) injection 0-15 Units  0-15 Units Subcutaneous TID WC Parks Ranger, DO   3 Units at 05/06/22 7494   insulin aspart (novoLOG) injection 6 Units  6 Units Subcutaneous TID WC Parks Ranger, DO   6 Units at 05/06/22 0819   insulin glargine-yfgn (SEMGLEE) injection 15 Units  15 Units Subcutaneous Daily Parks Ranger, DO   15 Units at 05/05/22 0845   lisinopril (ZESTRIL) tablet 20 mg  20 mg Oral Daily Waldon Merl F, NP   20 mg at 05/06/22 4967   magnesium hydroxide (MILK OF MAGNESIA) suspension 30 mL  30 mL Oral Daily PRN Waldon Merl F, NP       metFORMIN  (GLUCOPHAGE) tablet 1,000 mg  1,000 mg Oral BID WC Waldon Merl F, NP   1,000 mg at 05/06/22 5916   multivitamin with minerals tablet 1 tablet  1 tablet Oral Daily Parks Ranger, DO   1 tablet at 05/06/22 3846   propranolol ER (INDERAL LA) 24 hr capsule 60 mg  60 mg Oral QHS Parks Ranger, DO   60 mg at 05/05/22 2111   QUEtiapine (SEROQUEL) tablet 200 mg  200 mg Oral QHS Parks Ranger, DO   200 mg at 05/05/22 2111   rOPINIRole (REQUIP) tablet 2 mg  2 mg Oral QHS Parks Ranger, DO   2 mg at 05/05/22 2111   rosuvastatin (CRESTOR) tablet 40 mg  40 mg Oral Daily Parks Ranger, DO   40 mg at 05/06/22 6599   traZODone (DESYREL) tablet 50 mg  50 mg Oral QHS PRN Parks Ranger, DO   50 mg at 05/05/22 2111   PTA Medications: Medications Prior to Admission  Medication Sig Dispense Refill Last Dose   acetaminophen (TYLENOL) 500 MG tablet Take 1,000 mg by mouth every 6 (six) hours as needed (pain).      amLODipine (NORVASC) 5 MG tablet Take 1 tablet (5 mg total) by mouth daily. (Patient not taking: Reported on 04/29/2022) 90 tablet 3    Armodafinil 250 MG tablet Take by mouth in the morning.  (Patient not taking: Reported on 04/29/2022)  b complex vitamins capsule Take 1 capsule by mouth daily. (Patient not taking: Reported on 04/29/2022)      carbidopa-levodopa (SINEMET CR) 50-200 MG per tablet Take 1 tablet by mouth 3 (three) times daily.  (Patient not taking: Reported on 04/29/2022)      cholecalciferol (VITAMIN D3) 25 MCG (1000 UT) tablet Take 1,000 Units by mouth daily.      DULoxetine (CYMBALTA) 60 MG capsule Take 120 mg by mouth at bedtime. 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 100 each 12    insulin degludec (TRESIBA FLEXTOUCH) 200 UNIT/ML FlexTouch Pen Inject  30-50 Units into the skin every evening. (Patient not taking: Reported on 04/29/2022) 15 mL 11    Insulin Pen Needle (NOVOFINE) 32G X 6 MM MISC 1 each by Does not apply route daily. 100 each 3    lamoTRIgine (LAMICTAL) 200 MG tablet Take 400 mg by mouth at bedtime.  (Patient not taking: Reported on 04/29/2022)      LORazepam (ATIVAN) 1 MG tablet Typically takes  1 to 2 times daily (Patient not taking: Reported on 04/29/2022)      metFORMIN (GLUCOPHAGE) 1000 MG tablet Take 1 tablet (1,000 mg total) by mouth 2 (two) times daily with a meal. (Patient not taking: Reported on 04/29/2022) 180 tablet 3    pregabalin (LYRICA) 50 MG capsule Take 1 capsule (50 mg total) by mouth 2 (two) times daily. (Patient not taking: Reported on 04/29/2022) 60 capsule 12    propranolol ER (INDERAL LA) 60 MG 24 hr capsule Take 60 mg by mouth at bedtime.       QUEtiapine (SEROQUEL) 300 MG tablet Take 300 mg by mouth at bedtime.      quinapril (ACCUPRIL) 20 MG tablet Take 1 tablet (20 mg total) by mouth at bedtime. (Patient not taking: Reported on 04/29/2022) 90 tablet 1    rOPINIRole (REQUIP) 2 MG tablet Take 2 mg by mouth 2 (two) times daily.      rosuvastatin (CRESTOR) 40 MG tablet Take 1 tablet (40 mg total) by mouth at bedtime. (Patient not taking: Reported on 04/29/2022) 90 tablet 1    RYBELSUS 7 MG TABS TAKE 1 TABLET BY MOUTH DAILY (Patient not taking: Reported on 04/29/2022) 30 tablet 1    zolpidem (AMBIEN CR) 12.5 MG CR tablet Take 12.5 mg by mouth at bedtime.       Patient Stressors: Financial difficulties   Health problems   Loss of relationships    Medication change or noncompliance    Patient Strengths: Ability for insight  Average or above average intelligence  Capable of independent living   Treatment Modalities: Medication Management, Group therapy, Case management,  1 to 1 session with clinician, Psychoeducation, Recreational therapy.   Physician Treatment Plan for Primary Diagnosis: MDD (major depressive  disorder), recurrent, severe, with psychosis ([place]) Long Term Goal(s): Improvement in symptoms so as ready for discharge   Short Term Goals: Ability to identify changes in lifestyle to reduce recurrence of condition will improve Ability to verbalize feelings will improve Ability to disclose and discuss suicidal ideas Ability to demonstrate self-control will improve Ability to identify and develop effective coping behaviors will improve Ability to maintain clinical measurements within normal limits will improve Compliance with prescribed medications will improve Ability to identify triggers  associated with substance abuse/mental health issues will improve  Medication Management: Evaluate patient's response, side effects, and tolerance of medication regimen.  Therapeutic Interventions: 1 to 1 sessions, Unit Group sessions and Medication administration.  Evaluation of Outcomes: Adequate for Discharge  Physician Treatment Plan for Secondary Diagnosis: Principal Problem:   MDD (major depressive disorder), recurrent, severe, with psychosis ([place]) Active Problems:   MDD (major depressive disorder)  Long Term Goal(s): Improvement in symptoms so as ready for discharge   Short Term Goals: Ability to identify changes in lifestyle to reduce recurrence of condition will improve Ability to verbalize feelings will improve Ability to disclose and discuss suicidal ideas Ability to demonstrate self-control will improve Ability to identify and develop effective coping behaviors will improve Ability to maintain clinical measurements within normal limits will improve Compliance with prescribed medications will improve Ability to identify triggers associated with substance abuse/mental health issues will improve     Medication Management: Evaluate patient's response, side effects, and tolerance of medication regimen.  Therapeutic Interventions: 1 to 1 sessions, Unit Group sessions and Medication  administration.  Evaluation of Outcomes: Adequate for Discharge   RN Treatment Plan for Primary Diagnosis: MDD (major depressive disorder), recurrent, severe, with psychosis ([place]) Long Term Goal(s): Knowledge of disease and therapeutic regimen to maintain health will improve  Short Term Goals: Ability to remain free from injury will improve, Ability to verbalize frustration and anger appropriately will improve, Ability to demonstrate self-control, Ability to participate in decision making will improve, Ability to verbalize feelings will improve, Ability to disclose and discuss suicidal ideas, Ability to identify and develop effective coping behaviors will improve, and Compliance with prescribed medications will improve  Medication Management: RN will administer medications as ordered by provider, will assess and evaluate patient's response and provide education to patient for prescribed medication. RN will report any adverse and/or side effects to prescribing provider.  Therapeutic Interventions: 1 on 1 counseling sessions, Psychoeducation, Medication administration, Evaluate responses to treatment, Monitor vital signs and CBGs as ordered, Perform/monitor CIWA, COWS, AIMS and Fall Risk screenings as ordered, Perform wound care treatments as ordered.  Evaluation of Outcomes: Adequate for Discharge   LCSW Treatment Plan for Primary Diagnosis: MDD (major depressive disorder), recurrent, severe, with psychosis ([place]) Long Term Goal(s): Safe transition to appropriate next level of care at discharge, Engage patient in therapeutic group addressing interpersonal concerns.  Short Term Goals: Engage patient in aftercare planning with referrals and resources, Increase social support, Increase ability to appropriately verbalize feelings, Increase emotional regulation, Facilitate acceptance of mental health diagnosis and concerns, and Increase skills for wellness and recovery  Therapeutic Interventions:  Assess for all discharge needs, 1 to 1 time with Social worker, Explore available resources and support systems, Assess for adequacy in community support network, Educate family and significant other(s) on suicide prevention, Complete Psychosocial Assessment, Interpersonal group therapy.  Evaluation of Outcomes: Adequate for Discharge   Progress in Treatment: Attending groups: Yes. Participating in groups: Yes. Taking medication as prescribed: Yes. Toleration medication: Yes. Family/Significant other contact made: Yes, individual(s) contacted:  SPE completed with pt's brother Bryan Flowers Patient understands diagnosis: Yes. Discussing patient identified problems/goals with staff: Yes. Medical problems stabilized or resolved: Yes. Denies suicidal/homicidal ideation: Yes. Issues/concerns per patient self-inventory: No. Other: None  New problem(s) identified: No, Describe:  None  New Short Term/Long Term Goal(s): Patient to work towards medication management for mood stabilization;  development of comprehensive mental wellness plan. Update 05/06/22: No changes at this time.  Patient Goals:  "help with sleep, rent and finances"  Update 05/06/22: No changes at this time.    Discharge Plan or Barriers: CSW will assist pt with development of appropriate discharge/aftercare plans. Update 05/06/22: No changes at this time.    Reason for Continuation of Hospitalization: Depression Medication stabilization   Estimated Length of Stay: 1-7 days  Last 3 [place] Suicide Severity Risk Score: Flowsheet Row Admission (Current) from 04/30/2022 in [facility] ED from 04/28/2022 in [place] No Risk Low Risk       Last PHQ 2/9 Scores:    09/16/2021    2:02 PM 09/15/2021    1:15 PM 05/15/2021    1:47 PM  Depression screen PHQ 2/9  Decreased Interest 0 0 1  Down, Depressed, Hopeless 0 0 1  PHQ - 2 Score 0 0 2   Altered sleeping 0 0 1  Tired, decreased energy 0 0 1  Change in appetite 0 0 0  Feeling bad or failure about yourself  0 0 1  Trouble concentrating 0 0 1  Moving slowly or fidgety/restless 0 0 1  Suicidal thoughts 0 0 0  PHQ-9 Score 0 0 7  Difficult doing work/chores Not difficult at all Not difficult at all Somewhat difficult    Scribe for Treatment Team: Robey Massmann A [place], [place] 05/06/2022 9:56 AM

## 2022-05-06 NOTE — Plan of Care (Signed)
Patient alert, verbal and able to make needs known. Pt oriented x4. He has ate breakfast this morning and denies any pain at present. Tolerating his medications well. Vital signs stable. Denies any SI/HI/AVH. Pt continues to stay in his room on airborne precautions due to being exposed to covid. He has no fever, no cough, no s/s of covid. Reminded pt to let nursing know if he starts to have a cough, shortness of breath or  feeling sick. Will continue q46mn check and provide verbal encouragement as needed.    Problem: Education: Goal: Knowledge of General Education information will improve Description: Including pain rating scale, medication(s)/side effects and non-pharmacologic comfort measures Outcome: Progressing   Problem: Health Behavior/Discharge Planning: Goal: Ability to manage health-related needs will improve Outcome: Progressing   Problem: Clinical Measurements: Goal: Ability to maintain clinical measurements within normal limits will improve Outcome: Progressing Goal: Will remain free from infection Outcome: Progressing Goal: Diagnostic test results will improve Outcome: Progressing Goal: Respiratory complications will improve Outcome: Progressing Goal: Cardiovascular complication will be avoided Outcome: Progressing   Problem: Activity: Goal: Risk for activity intolerance will decrease Outcome: Progressing   Problem: Nutrition: Goal: Adequate nutrition will be maintained Outcome: Progressing   Problem: Coping: Goal: Level of anxiety will decrease Outcome: Progressing   Problem: Elimination: Goal: Will not experience complications related to bowel motility Outcome: Progressing Goal: Will not experience complications related to urinary retention Outcome: Progressing   Problem: Skin Integrity: Goal: Risk for impaired skin integrity will decrease Outcome: Progressing   Problem: Education: Goal: Knowledge of  General Education information/materials  will improve Outcome: Progressing Goal: Emotional status will improve Outcome: Progressing Goal: Mental status will improve Outcome: Progressing Goal: Verbalization of understanding the information provided will improve Outcome: Progressing   Problem: Activity: Goal: Interest or engagement in activities will improve Outcome: Progressing Goal: Sleeping patterns will improve Outcome: Progressing   Problem: Coping: Goal: Ability to verbalize frustrations and anger appropriately will improve Outcome: Progressing Goal: Ability to demonstrate self-control will improve Outcome: Progressing   Problem: Health Behavior/Discharge Planning: Goal: Identification of resources available to assist in meeting health care needs will improve Outcome: Progressing Goal: Compliance with treatment plan for underlying cause of condition will improve Outcome: Progressing   Problem: Physical Regulation: Goal: Ability to maintain clinical measurements within normal limits will improve Outcome: Progressing   Problem: Safety: Goal: Periods of time without injury will increase Outcome: Progressing   Problem: Education: Goal: Utilization of techniques to improve thought processes will improve Outcome: Progressing Goal: Knowledge of the prescribed therapeutic regimen will improve Outcome: Progressing   Problem: Activity: Goal: Interest or engagement in leisure activities will improve Outcome: Progressing Goal: Imbalance in normal sleep/wake cycle will improve Outcome: Progressing   Problem: Coping: Goal: Coping ability will improve Outcome: Progressing Goal: Will verbalize feelings Outcome: Progressing   Problem: Health Behavior/Discharge Planning: Goal: Ability to make decisions will improve Outcome: Progressing Goal: Compliance with therapeutic regimen will improve Outcome: Progressing   Problem: Role Relationship: Goal: Will demonstrate positive changes in social behaviors and  relationships Outcome: Progressing   Problem: Safety: Goal: Ability to disclose and discuss suicidal ideas will improve Outcome: Progressing Goal: Ability to identify and utilize support systems that promote safety will improve Outcome: Progressing   Problem: Self-Concept: Goal: Will verbalize positive feelings about self Outcome: Progressing Goal: Level of anxiety will decrease Outcome: Progressing   Problem: Education: Goal: Ability to state activities that reduce stress will improve Outcome: Progressing   Problem: Coping: Goal: Ability  to identify and develop effective coping behavior will improve Outcome: Progressing   Problem: Self-Concept: Goal: Ability to identify factors that promote anxiety will improve Outcome: Progressing Goal: Level of anxiety will decrease Outcome: Progressing Goal: Ability to modify response to factors that promote anxiety will improve Outcome: Progressing

## 2022-05-06 NOTE — Plan of Care (Signed)
  Problem: Education: Goal: Knowledge of General Education information will improve Description: Including pain rating scale, medication(s)/side effects and non-pharmacologic comfort measures Outcome: Progressing   Problem: Coping: Goal: Level of anxiety will decrease Outcome: Progressing   Problem: Safety: Goal: Ability to remain free from injury will improve Outcome: Progressing   Problem: Education: Goal: Emotional status will improve Outcome: Progressing

## 2022-05-06 NOTE — Progress Notes (Signed)
[facility] MD Progress Note  05/06/2022 11:24 AM Bryan Flowers  MRN:  [id number] Subjective: [name] has not had any fainting episodes.  States that he is sleeping well his appetite is good.  He states his mood is improving, which I believe is associated more therapeutic milieu.  His COVID test came back negative.  Been compliant with his medications and denies any side effects.  Talked about discharge on Friday and social work is going to give him some phone numbers for social services.  Principal Problem: MDD (major depressive disorder), recurrent, severe, with psychosis ([place]) Diagnosis: Principal Problem:   MDD (major depressive disorder), recurrent, severe, with psychosis ([place]) Active Problems:   MDD (major depressive disorder)  Total Time spent with patient: 15 minutes  Past Psychiatric History:  He is currently seeing a psychiatrist in [place] at [facility].  He has done well on Seroquel, Cymbalta, and Lamictal and takes Ambien as needed.  He has one visit to the emergency room back in 2018 for depression.  No past admissions.  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/2017   Depression    Diabetes mellitus without complication (HCC)    GERD (gastroesophageal reflux disease)    Hyperlipidemia    Hypertension    Insomnia    Parkinson disease (HCC)    Peptic ulcer disease with hemorrhage    Sleep apnea    on cpap    Past Surgical History:  Procedure Laterality Date   ESOPHAGOGASTRODUODENOSCOPY Left 03/16/2016   Procedure: ESOPHAGOGASTRODUODENOSCOPY (EGD);  Surgeon: Manus Gunning, MD;  Location: [facility];  Service: Gastroenterology;  Laterality: Left;   FOOT FRACTURE SURGERY Right    Family History:  Family History  Problem Relation Age of  Onset   Cancer Mother    Heart disease Father    Arthritis Brother    Colon polyps Neg Hx    Colon cancer Neg Hx    Esophageal cancer Neg Hx    Rectal cancer Neg Hx    Stomach cancer Neg Hx     Social History:  Social History   Substance and Sexual Activity  Alcohol Use No   Alcohol/week: 0.0 standard drinks of alcohol     Social History   Substance and Sexual Activity  Drug Use No    Social History   Socioeconomic History   Marital status: Widowed    Spouse name: Not on file   Number of children: 0   Years of education: Not on file   Highest education level: High school graduate  Occupational History    Employer: DISABLED  Tobacco Use   Smoking status: Never   Smokeless tobacco: Never   Tobacco comments:    Non-smoker  Vaping Use   Vaping Use: Never used  Substance and Sexual Activity   Alcohol use: No    Alcohol/week: 0.0 standard drinks of alcohol   Drug use: No   Sexual activity: Not Currently    Partners: Female  Other Topics Concern   Not on file  Social History Narrative   Pt lives alone.    Social Determinants of Health   Financial Resource Strain: Low Risk  (02/25/2021)   Overall Financial Resource Strain (CARDIA)    Difficulty of Paying Living Expenses:  Not hard at all  Food Insecurity: No Food Insecurity (02/25/2021)   Hunger Vital Sign    Worried About Running Out of Food in the Last Year: Never true    Ran Out of Food in the Last Year: Never true  Transportation Needs: No Transportation Needs (02/25/2021)   PRAPARE - Hydrologist (Medical): No    Lack of Transportation (Non-Medical): No  Physical Activity: Sufficiently Active (02/25/2021)   Exercise Vital Sign    Days of Exercise per Week: 7 days    Minutes of Exercise per Session: 30 min  Stress: No Stress Concern Present (02/25/2021)   [place]    Feeling of Stress : Only a little  Social  Connections: Moderately Isolated (02/25/2021)   Social Connection and Isolation Panel [NHANES]    Frequency of Communication with Friends and Family: More than three times a week    Frequency of Social Gatherings with Friends and Family: More than three times a week    Attends Religious Services: More than 4 times per year    Active Member of [employer] or Organizations: No    Attends Archivist Meetings: Never    Marital Status: Widowed   Additional Social History:                         Sleep: Good  Appetite:  Good  Current Medications: Current Facility-Administered Medications  Medication Dose Route Frequency Provider Last Rate Last Admin   acetaminophen (TYLENOL) tablet 650 mg  650 mg Oral Q6H PRN Waldon Merl F, NP   650 mg at 05/06/22 0821   alum & mag hydroxide-simeth (MAALOX/MYLANTA) 200-200-20 MG/5ML suspension 30 mL  30 mL Oral Q4H PRN Waldon Merl F, NP       amLODipine (NORVASC) tablet 10 mg  10 mg Oral Daily Waldon Merl F, NP   10 mg at 05/06/22 1610   carbidopa-levodopa (SINEMET CR) 50-200 MG per tablet controlled release 1 tablet  1 tablet Oral BID Parks Ranger, DO   1 tablet at 05/06/22 9604   DULoxetine (CYMBALTA) DR capsule 60 mg  60 mg Oral Daily Parks Ranger, DO   60 mg at 05/06/22 5409   ergocalciferol (VITAMIN D2) (DRISDOL) 8000 units/mL (200 mcg/mL) drops 4,000 Units  4,000 Units Oral Daily Parks Ranger, DO   4,000 Units at 05/06/22 0820   insulin aspart (novoLOG) injection 0-15 Units  0-15 Units Subcutaneous TID WC Parks Ranger, DO   3 Units at 05/06/22 8119   insulin aspart (novoLOG) injection 6 Units  6 Units Subcutaneous TID WC Parks Ranger, DO   6 Units at 05/06/22 1478   insulin glargine-yfgn [place]) injection 15 Units  15 Units Subcutaneous Daily Parks Ranger, DO   15 Units at 05/06/22 1055   lisinopril (ZESTRIL) tablet 20 mg  20 mg Oral Daily Waldon Merl F,  NP   20 mg at 05/06/22 2956   magnesium hydroxide (MILK OF MAGNESIA) suspension 30 mL  30 mL Oral Daily PRN Waldon Merl F, NP       metFORMIN (GLUCOPHAGE) tablet 1,000 mg  1,000 mg Oral BID WC Waldon Merl F, NP   1,000 mg at 05/06/22 2130   multivitamin with minerals tablet 1 tablet  1 tablet Oral Daily Parks Ranger, DO   1 tablet at 05/06/22 8657   propranolol ER (INDERAL LA) 24 hr  capsule 60 mg  60 mg Oral QHS Parks Ranger, DO   60 mg at 05/05/22 2111   QUEtiapine (SEROQUEL) tablet 200 mg  200 mg Oral QHS Parks Ranger, DO   200 mg at 05/05/22 2111   rOPINIRole (REQUIP) tablet 2 mg  2 mg Oral QHS Parks Ranger, DO   2 mg at 05/05/22 2111   rosuvastatin (CRESTOR) tablet 40 mg  40 mg Oral Daily Parks Ranger, DO   40 mg at 05/06/22 6962   traZODone (DESYREL) tablet 50 mg  50 mg Oral QHS PRN Parks Ranger, DO   50 mg at 05/05/22 2111    Lab Results:  Results for orders placed or performed during the hospital encounter of 04/30/22 (from the past 48 hour(s))  Glucose, capillary     Status: Abnormal   Collection Time: 05/04/22 11:41 AM  Result Value Ref Range   Glucose-Capillary 194 (H) 70 - 99 mg/dL    Comment: Glucose reference range applies only to samples taken after fasting for at least 8 hours.  Glucose, capillary     Status: Abnormal   Collection Time: 05/04/22  4:03 PM  Result Value Ref Range   Glucose-Capillary 157 (H) 70 - 99 mg/dL    Comment: Glucose reference range applies only to samples taken after fasting for at least 8 hours.  Glucose, capillary     Status: Abnormal   Collection Time: 05/05/22  8:02 AM  Result Value Ref Range   Glucose-Capillary 131 (H) 70 - 99 mg/dL    Comment: Glucose reference range applies only to samples taken after fasting for at least 8 hours.  SARS CORONAVIRUS 2 (TAT 6-24 HRS) Anterior Nasal Swab     Status: None   Collection Time: 05/05/22  9:16 AM   Specimen: Anterior Nasal  Swab  Result Value Ref Range   SARS Coronavirus 2 NEGATIVE NEGATIVE    Comment: (NOTE) SARS-CoV-2 target nucleic acids are NOT DETECTED.  The SARS-CoV-2 RNA is generally detectable in upper and lower respiratory specimens during the acute phase of infection. Negative results do not preclude SARS-CoV-2 infection, do not rule out co-infections with other pathogens, and should not be used as the sole basis for treatment or other patient management decisions. Negative results must be combined with clinical observations, patient history, and epidemiological information. The expected result is Negative.  Fact Sheet for Patients: SugarRoll.be  Fact Sheet for Healthcare Providers: https://www.woods-mathews.com/  This test is not yet approved or cleared by the [place] FDA and  has been authorized for detection and/or diagnosis of SARS-CoV-2 by FDA under an Emergency Use Authorization (EUA). This EUA will remain  in effect (meaning this test can be used) for the duration of the COVID-19 declaration under Se ction 564(b)(1) of the Act, 21 U.S.C. section 360bbb-3(b)(1), unless the authorization is terminated or revoked sooner.  Performed at [facility], [place] [address], [place], [place] [address]   Glucose, capillary     Status: Abnormal   Collection Time: 05/05/22 11:40 AM  Result Value Ref Range   Glucose-Capillary 128 (H) 70 - 99 mg/dL    Comment: Glucose reference range applies only to samples taken after fasting for at least 8 hours.  Glucose, capillary     Status: Abnormal   Collection Time: 05/05/22  4:26 PM  Result Value Ref Range   Glucose-Capillary 152 (H) 70 - 99 mg/dL    Comment: Glucose reference range applies only to samples taken after  fasting for at least 8 hours.  Glucose, capillary     Status: Abnormal   Collection Time: 05/05/22  8:53 PM  Result Value Ref Range   Glucose-Capillary 167 (H) 70 - 99 mg/dL     Comment: Glucose reference range applies only to samples taken after fasting for at least 8 hours.  Glucose, capillary     Status: Abnormal   Collection Time: 05/06/22  7:48 AM  Result Value Ref Range   Glucose-Capillary 144 (H) 70 - 99 mg/dL    Comment: Glucose reference range applies only to samples taken after fasting for at least 8 hours.    Blood Alcohol level:  Lab Results  Component Value Date   ETH <10 04/28/2022   ETH <5 40/98/1191    Metabolic Disorder Labs: Lab Results  Component Value Date   HGBA1C 12.1 (H) 04/28/2022   MPG 300.57 04/28/2022   MPG 280 09/15/2021   No results found for: "PROLACTIN" Lab Results  Component Value Date   CHOL 247 (H) 09/15/2021   TRIG 307 (H) 09/15/2021   HDL 44 09/15/2021   CHOLHDL 5.6 (H) 09/15/2021   LDLCALC 156 (H) 09/15/2021   LDLCALC 129 (H) 05/15/2021    Physical Findings: AIMS:  , ,  ,  ,    CIWA:    COWS:     Musculoskeletal: Strength & Muscle Tone: within normal limits Gait & Station: normal Patient leans: N/A  Psychiatric Specialty Exam:  Presentation  General Appearance: Appropriate for Environment  Eye Contact:Good  Speech:Clear and Coherent  Speech Volume:Normal  Handedness:No data recorded  Mood and Affect  Mood:Depressed  Affect:Congruent   Thought Process  Thought Processes:Coherent  Descriptions of Associations:Intact  Orientation:Full (Time, Place and Person)  Thought Content:WDL  History of Schizophrenia/Schizoaffective disorder:No  Duration of Psychotic Symptoms:No data recorded Hallucinations:No data recorded Ideas of Reference:None  Suicidal Thoughts:No data recorded Homicidal Thoughts:No data recorded  Sensorium  Memory:Immediate Good; Recent Good; Remote Good  Judgment:Good  Insight:Good   Executive Functions  Concentration:Good  Attention Span:Good  Leigh of Knowledge:Good  Language:Good   Psychomotor Activity  Psychomotor Activity:No data  recorded  Assets  Assets:Communication Skills; Desire for Improvement; Financial Resources/Insurance; Housing; Resilience   Sleep  Sleep:No data recorded   Physical Exam: Physical Exam Vitals and nursing note reviewed.  Constitutional:      Appearance: Normal appearance. He is normal weight.  Neurological:     General: No focal deficit present.     Mental Status: He is alert and oriented to person, place, and time.  Psychiatric:        Attention and Perception: Attention and perception normal.        Mood and Affect: Mood and affect normal.        Speech: Speech normal.        Behavior: Behavior normal. Behavior is cooperative.        Thought Content: Thought content normal.        Cognition and Memory: Cognition and memory normal.        Judgment: Judgment normal.    Review of Systems  Constitutional: Negative.   HENT: Negative.    Eyes: Negative.   Respiratory: Negative.    Cardiovascular: Negative.   Gastrointestinal: Negative.   Genitourinary: Negative.   Musculoskeletal: Negative.   Skin: Negative.   Neurological: Negative.   Endo/Heme/Allergies: Negative.   Psychiatric/Behavioral: Negative.     Blood pressure (!) 142/81, pulse 76, temperature 97.8 F (36.6 C), resp. rate 18,  height '5\' 11"'$  (1.803 m), weight 81.2 kg, SpO2 99 %. Body mass index is 24.97 kg/m.   Treatment Plan Summary: Daily contact with patient to assess and evaluate symptoms and progress in treatment, Medication management, and Plan continue current medications.  Parks Ranger, DO 05/06/2022, 11:24 AM

## 2022-05-07 DIAGNOSIS — F333 Major depressive disorder, recurrent, severe with psychotic symptoms: Secondary | ICD-10-CM | POA: Diagnosis not present

## 2022-05-07 LAB — GLUCOSE, CAPILLARY
Glucose-Capillary: 186 mg/dL — ABNORMAL HIGH (ref 70–99)
Glucose-Capillary: 191 mg/dL — ABNORMAL HIGH (ref 70–99)
Glucose-Capillary: 166 mg/dL — ABNORMAL HIGH (ref 70–99)
Glucose-Capillary: 128 mg/dL — ABNORMAL HIGH (ref 70–99)

## 2022-05-07 LAB — SARS CORONAVIRUS 2 (TAT 6-24 HRS): SARS Coronavirus 2: NEGATIVE

## 2022-05-07 NOTE — Plan of Care (Signed)
Alert and oriented x4. Sitting up in bed reading a book. Denies any pain at present. Patients VSS today. Denies any SI/HI/AVH. NO s/s of infection today, denies cough, SOB. Afebrile. NO EPS symptoms noted. Overall condition has improved. Patient was tested for covid again today due to contact, pending results. Will continue q71mn checks and provide verbal encouragement as needed.  Problem: Education: Goal: Knowledge of General Education information will improve Description: Including pain rating scale, medication(s)/side effects and non-pharmacologic comfort measures Outcome: Progressing   Problem: Health Behavior/Discharge Planning: Goal: Ability to manage health-related needs will improve Outcome: Progressing   Problem: Clinical Measurements: Goal: Ability to maintain clinical measurements within normal limits will improve Outcome: Progressing Goal: Will remain free from infection Outcome: Progressing Goal: Diagnostic test results will improve Outcome: Progressing Goal: Respiratory complications will improve Outcome: Progressing Goal: Cardiovascular complication will be avoided Outcome: Progressing   Problem: Elimination: Goal: Will not experience complications related to bowel motility Outcome: Progressing Goal: Will not experience complications related to urinary retention Outcome: Progressing   Problem: Pain Managment: Goal: General experience of comfort will improve Outcome: Progressing   Problem: Safety: Goal: Ability to remain free from injury will improve Outcome: Progressing   Problem: Skin Integrity: Goal: Risk for impaired skin integrity will decrease Outcome: Progressing   Problem: Education: Goal: Knowledge of [place] General Education information/materials will improve Outcome: Progressing Goal: Emotional status will improve Outcome: Progressing Goal: Mental status will improve Outcome: Progressing Goal: Verbalization of understanding the  information provided will improve Outcome: Progressing   Problem: Activity: Goal: Interest or engagement in activities will improve Outcome: Progressing Goal: Sleeping patterns will improve Outcome: Progressing   Problem: Coping: Goal: Ability to verbalize frustrations and anger appropriately will improve Outcome: Progressing Goal: Ability to demonstrate self-control will improve Outcome: Progressing   Problem: Health Behavior/Discharge Planning: Goal: Identification of resources available to assist in meeting health care needs will improve Outcome: Progressing Goal: Compliance with treatment plan for underlying cause of condition will improve Outcome: Progressing   Problem: Physical Regulation: Goal: Ability to maintain clinical measurements within normal limits will improve Outcome: Progressing   Problem: Safety: Goal: Periods of time without injury will increase Outcome: Progressing   Problem: Activity: Goal: Interest or engagement in leisure activities will improve Outcome: Progressing Goal: Imbalance in normal sleep/wake cycle will improve Outcome: Progressing   Problem: Coping: Goal: Coping ability will improve Outcome: Progressing Goal: Will verbalize feelings Outcome: Progressing   Problem: Health Behavior/Discharge Planning: Goal: Ability to make decisions will improve Outcome: Progressing Goal: Compliance with therapeutic regimen will improve Outcome: Progressing   Problem: Role Relationship: Goal: Will demonstrate positive changes in social behaviors and relationships Outcome: Progressing   Problem: Safety: Goal: Ability to disclose and discuss suicidal ideas will improve Outcome: Progressing Goal: Ability to identify and utilize support systems that promote safety will improve Outcome: Progressing   Problem: Self-Concept: Goal: Will verbalize positive feelings about self Outcome: Progressing Goal: Level of anxiety will decrease Outcome:  Progressing   Problem: Coping: Goal: Ability to identify and develop effective coping behavior will improve Outcome: Progressing   Problem: Self-Concept: Goal: Ability to identify factors that promote anxiety will improve Outcome: Progressing Goal: Level of anxiety will decrease Outcome: Progressing Goal: Ability to modify response to factors that promote anxiety will improve Outcome: Progressing

## 2022-05-07 NOTE — Progress Notes (Signed)
Patient is alert and oriented times 4. Mood and affect appropriate. Patient rates pain as 0/10. He denies SI, HI, and AVH. Endorses denies feelings of anxiety and depression at 2/10 this time. States he slept well last night. Evening meds given whole by mouth W/O difficulty. Patient ate snack in his room. Patient remains on unit with Q15 minute checks in place.

## 2022-05-07 NOTE — Progress Notes (Signed)
[facility] MD Progress Note  05/07/2022 12:40 PM Bryan Flowers  MRN:  [id number] Subjective: Bryan Flowers states that he is doing better.  We discussed discharge tomorrow and he says he thinks that he will be ready.  I think the break from his finances has helped.  After his episode his meds really have not changed.  Seems to be doing pretty well so I am going to leave them alone.  States that he is sleeping well and his appetite is good.  He denies any side effects from his medication.  Denies any suicidal ideation.  Principal Problem: MDD (major depressive disorder), recurrent, severe, with psychosis ([place]) Diagnosis: Principal Problem:   MDD (major depressive disorder), recurrent, severe, with psychosis ([place]) Active Problems:   MDD (major depressive disorder)  Total Time spent with patient: 20  Past Psychiatric History:  He is currently seeing a psychiatrist in [place] at [facility].  He has done well on Seroquel, Cymbalta, and Lamictal and takes Ambien as needed.  He has one visit to the emergency room back in 2018 for depression.  No past admissions.  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   Depression    Diabetes mellitus without complication (HCC)    GERD (gastroesophageal reflux disease)    Hyperlipidemia    Hypertension    Insomnia    Parkinson disease (HCC)    Peptic ulcer disease with hemorrhage    Sleep apnea    on cpap    Past Surgical History:  Procedure Laterality Date   ESOPHAGOGASTRODUODENOSCOPY Left 03/16/2016   Procedure: ESOPHAGOGASTRODUODENOSCOPY (EGD);  Surgeon: Manus Gunning, MD;  Location: [place];  Service: Gastroenterology;  Laterality: Left;   FOOT FRACTURE SURGERY Right    Family History:  Family History  Problem  Relation Age of Onset   Cancer Mother    Heart disease Father    Arthritis Brother    Colon polyps Neg Hx    Colon cancer Neg Hx    Esophageal cancer Neg Hx    Rectal cancer Neg Hx    Stomach cancer Neg Hx     Social History:  Social History   Substance and Sexual Activity  Alcohol Use No   Alcohol/week: 0.0 standard drinks of alcohol     Social History   Substance and Sexual Activity  Drug Use No    Social History   Socioeconomic History   Marital status: Widowed    Spouse name: Not on file   Number of children: 0   Years of education: Not on file   Highest education level: High school graduate  Occupational History    Employer: DISABLED  Tobacco Use   Smoking status: Never   Smokeless tobacco: Never   Tobacco comments:    Non-smoker  Vaping Use   Vaping Use: Never used  Substance and Sexual Activity   Alcohol use: No    Alcohol/week: 0.0 standard drinks of alcohol   Drug use: No   Sexual activity: Not Currently    Partners: Female  Other Topics Concern   Not on file  Social History Narrative   Pt lives alone.    Social Determinants of Health   Financial Resource Strain: Low Risk  (02/25/2021)   Overall Financial  Resource Strain (CARDIA)    Difficulty of Paying Living Expenses: Not hard at all  Food Insecurity: No Food Insecurity (02/25/2021)   Hunger Vital Sign    Worried About Running Out of Food in the Last Year: Never true    Ran Out of Food in the Last Year: Never true  Transportation Needs: No Transportation Needs (02/25/2021)   PRAPARE - Hydrologist (Medical): No    Lack of Transportation (Non-Medical): No  Physical Activity: Sufficiently Active (02/25/2021)   Exercise Vital Sign    Days of Exercise per Week: 7 days    Minutes of Exercise per Session: 30 min  Stress: No Stress Concern Present (02/25/2021)   [place]    Feeling of Stress : Only a  little  Social Connections: Moderately Isolated (02/25/2021)   Social Connection and Isolation Panel [NHANES]    Frequency of Communication with Friends and Family: More than three times a week    Frequency of Social Gatherings with Friends and Family: More than three times a week    Attends Religious Services: More than 4 times per year    Active Member of [employer] or Organizations: No    Attends Archivist Meetings: Never    Marital Status: Widowed   Additional Social History:                         Sleep: Good  Appetite:  Good  Current Medications: Current Facility-Administered Medications  Medication Dose Route Frequency Provider Last Rate Last Admin   acetaminophen (TYLENOL) tablet 650 mg  650 mg Oral Q6H PRN Waldon Merl F, NP   650 mg at 05/06/22 0821   alum & mag hydroxide-simeth (MAALOX/MYLANTA) 200-200-20 MG/5ML suspension 30 mL  30 mL Oral Q4H PRN Waldon Merl F, NP       amLODipine (NORVASC) tablet 10 mg  10 mg Oral Daily Waldon Merl F, NP   10 mg at 05/07/22 0858   carbidopa-levodopa (SINEMET CR) 50-200 MG per tablet controlled release 1 tablet  1 tablet Oral BID Parks Ranger, DO   1 tablet at 05/07/22 0858   DULoxetine (CYMBALTA) DR capsule 60 mg  60 mg Oral Daily Parks Ranger, DO   60 mg at 05/07/22 0859   ergocalciferol (VITAMIN D2) (DRISDOL) 8000 units/mL (200 mcg/mL) drops 4,000 Units  4,000 Units Oral Daily Parks Ranger, DO   4,000 Units at 05/07/22 4270   insulin aspart (novoLOG) injection 0-15 Units  0-15 Units Subcutaneous TID WC Parks Ranger, DO   4 Units at 05/07/22 1139   insulin aspart (novoLOG) injection 6 Units  6 Units Subcutaneous TID WC Parks Ranger, DO   6 Units at 05/07/22 1137   insulin glargine-yfgn [facility]) injection 15 Units  15 Units Subcutaneous Daily Parks Ranger, DO   15 Units at 05/07/22 0858   lisinopril (ZESTRIL) tablet 20 mg  20 mg Oral Daily  Waldon Merl F, NP   20 mg at 05/07/22 6237   magnesium hydroxide (MILK OF MAGNESIA) suspension 30 mL  30 mL Oral Daily PRN Waldon Merl F, NP       metFORMIN (GLUCOPHAGE) tablet 1,000 mg  1,000 mg Oral BID WC Waldon Merl F, NP   1,000 mg at 05/07/22 0801   multivitamin with minerals tablet 1 tablet  1 tablet Oral Daily Parks Ranger, DO   1 tablet  at 05/07/22 0858   propranolol ER (INDERAL LA) 24 hr capsule 60 mg  60 mg Oral QHS Parks Ranger, DO   60 mg at 05/06/22 2121   QUEtiapine (SEROQUEL) tablet 200 mg  200 mg Oral QHS Parks Ranger, DO   200 mg at 05/06/22 2121   rOPINIRole (REQUIP) tablet 2 mg  2 mg Oral QHS Parks Ranger, DO   2 mg at 05/06/22 2121   rosuvastatin (CRESTOR) tablet 40 mg  40 mg Oral Daily Parks Ranger, DO   40 mg at 05/07/22 1136   traZODone (DESYREL) tablet 50 mg  50 mg Oral QHS PRN Parks Ranger, DO   50 mg at 05/05/22 2111    Lab Results:  Results for orders placed or performed during the hospital encounter of 04/30/22 (from the past 48 hour(s))  Glucose, capillary     Status: Abnormal   Collection Time: 05/05/22  4:26 PM  Result Value Ref Range   Glucose-Capillary 152 (H) 70 - 99 mg/dL    Comment: Glucose reference range applies only to samples taken after fasting for at least 8 hours.  Glucose, capillary     Status: Abnormal   Collection Time: 05/05/22  8:53 PM  Result Value Ref Range   Glucose-Capillary 167 (H) 70 - 99 mg/dL    Comment: Glucose reference range applies only to samples taken after fasting for at least 8 hours.  Glucose, capillary     Status: Abnormal   Collection Time: 05/06/22  7:48 AM  Result Value Ref Range   Glucose-Capillary 144 (H) 70 - 99 mg/dL    Comment: Glucose reference range applies only to samples taken after fasting for at least 8 hours.  Glucose, capillary     Status: Abnormal   Collection Time: 05/06/22 11:46 AM  Result Value Ref Range    Glucose-Capillary 217 (H) 70 - 99 mg/dL    Comment: Glucose reference range applies only to samples taken after fasting for at least 8 hours.  Glucose, capillary     Status: Abnormal   Collection Time: 05/06/22  4:30 PM  Result Value Ref Range   Glucose-Capillary 198 (H) 70 - 99 mg/dL    Comment: Glucose reference range applies only to samples taken after fasting for at least 8 hours.  Glucose, capillary     Status: Abnormal   Collection Time: 05/07/22  8:00 AM  Result Value Ref Range   Glucose-Capillary 186 (H) 70 - 99 mg/dL    Comment: Glucose reference range applies only to samples taken after fasting for at least 8 hours.  Glucose, capillary     Status: Abnormal   Collection Time: 05/07/22 11:34 AM  Result Value Ref Range   Glucose-Capillary 191 (H) 70 - 99 mg/dL    Comment: Glucose reference range applies only to samples taken after fasting for at least 8 hours.    Blood Alcohol level:  Lab Results  Component Value Date   ETH <10 04/28/2022   ETH <5 20/25/4270    Metabolic Disorder Labs: Lab Results  Component Value Date   HGBA1C 12.1 (H) 04/28/2022   MPG 300.57 04/28/2022   MPG 280 09/15/2021   No results found for: "PROLACTIN" Lab Results  Component Value Date   CHOL 247 (H) 09/15/2021   TRIG 307 (H) 09/15/2021   HDL 44 09/15/2021   CHOLHDL 5.6 (H) 09/15/2021   LDLCALC 156 (H) 09/15/2021   LDLCALC 129 (H) 05/15/2021    Physical Findings: AIMS:  , ,  ,  ,  CIWA:    COWS:     Musculoskeletal: Strength & Muscle Tone: within normal limits Gait & Station: normal Patient leans: N/A  Psychiatric Specialty Exam:  Presentation  General Appearance: Appropriate for Environment  Eye Contact:Good  Speech:Clear and Coherent  Speech Volume:Normal  Handedness:No data recorded  Mood and Affect  Mood:Depressed  Affect:Congruent   Thought Process  Thought Processes:Coherent  Descriptions of Associations:Intact  Orientation:Full (Time, Place and  Person)  Thought Content:WDL  History of Schizophrenia/Schizoaffective disorder:No  Duration of Psychotic Symptoms:No data recorded Hallucinations:No data recorded Ideas of Reference:None  Suicidal Thoughts:No data recorded Homicidal Thoughts:No data recorded  Sensorium  Memory:Immediate Good; Recent Good; Remote Good  Judgment:Good  Insight:Good   Executive Functions  Concentration:Good  Attention Span:Good  Recall:Good  Fund of Knowledge:Good  Language:Good   Psychomotor Activity  Psychomotor Activity:No data recorded  Assets  Assets:Communication Skills; Desire for Improvement; Financial Resources/Insurance; Housing; Resilience   Sleep  Sleep:No data recorded   Physical Exam: Physical Exam Vitals and nursing note reviewed.  Constitutional:      Appearance: Normal appearance. He is normal weight.  Neurological:     General: No focal deficit present.     Mental Status: He is alert and oriented to person, place, and time.  Psychiatric:        Attention and Perception: Attention and perception normal.        Mood and Affect: Mood is depressed.        Speech: Speech normal.        Behavior: Behavior normal. Behavior is cooperative.        Thought Content: Thought content normal.        Cognition and Memory: Cognition and memory normal.        Judgment: Judgment normal.    Review of Systems  Constitutional: Negative.   HENT: Negative.    Eyes: Negative.   Respiratory: Negative.    Cardiovascular: Negative.   Gastrointestinal: Negative.   Genitourinary: Negative.   Musculoskeletal: Negative.   Skin: Negative.   Neurological: Negative.   Endo/Heme/Allergies: Negative.   Psychiatric/Behavioral: Negative.     Blood pressure 128/72, pulse 69, temperature 98.1 F (36.7 C), resp. rate 18, height '5\' 11"'$  (1.803 m), weight 81.2 kg, SpO2 97 %. Body mass index is 24.97 kg/m.   Treatment Plan Summary: Daily contact with patient to assess and evaluate  symptoms and progress in treatment, Medication management, and Plan continue current medications.  Discharge tomorrow.  Parks Ranger, DO 05/07/2022, 12:40 PM

## 2022-05-08 DIAGNOSIS — F333 Major depressive disorder, recurrent, severe with psychotic symptoms: Secondary | ICD-10-CM | POA: Diagnosis not present

## 2022-05-08 LAB — GLUCOSE, CAPILLARY
Glucose-Capillary: 121 mg/dL — ABNORMAL HIGH (ref 70–99)
Glucose-Capillary: 180 mg/dL — ABNORMAL HIGH (ref 70–99)

## 2022-05-08 MED ORDER — QUETIAPINE FUMARATE 200 MG PO TABS: 200.0000 mg | ORAL_TABLET | Freq: Every day | ORAL | 3 refills | Status: DC

## 2022-05-08 MED ORDER — METFORMIN HCL 1000 MG PO TABS: 1000.0000 mg | ORAL_TABLET | Freq: Two times a day (BID) | ORAL | 3 refills | Status: DC

## 2022-05-08 MED ORDER — TRAZODONE HCL 50 MG PO TABS: 50.0000 mg | ORAL_TABLET | Freq: Every evening | ORAL | 3 refills | Status: DC | PRN

## 2022-05-08 MED ORDER — INSULIN GLARGINE-YFGN 100 UNIT/ML ~~LOC~~ SOLN: 15.0000 [IU] | Freq: Every day | SUBCUTANEOUS | 11 refills | Status: DC

## 2022-05-08 MED ORDER — LISINOPRIL 20 MG PO TABS: 20.0000 mg | ORAL_TABLET | Freq: Every day | ORAL | 3 refills | Status: DC

## 2022-05-08 MED ORDER — DULOXETINE HCL 60 MG PO CPEP: 60.0000 mg | ORAL_CAPSULE | Freq: Every day | ORAL | 3 refills | Status: DC

## 2022-05-08 MED ORDER — INSULIN ASPART 100 UNIT/ML IJ SOLN: 6.0000 [IU] | Freq: Three times a day (TID) | INTRAMUSCULAR | 11 refills | Status: DC

## 2022-05-08 NOTE — Progress Notes (Signed)

## 2022-05-08 NOTE — Care Management Important Message (Signed)
Important Message  Patient Details  Name: CASHTYN POULIOT MRN: [id number] Date of Birth: 1961-08-31   Medicare Important Message Given:  Yes     Redell Nazir A [place], [place] 05/08/2022, 11:37 AM

## 2022-05-08 NOTE — Discharge Summary (Signed)
Physician Discharge Summary Note  Patient:  Bryan Flowers is an 61 y.o., male MRN:  [id number] DOB:  Sep 14, 1961 Patient phone:  [phone] (home)  Patient address:   [address] [place] [address],  Total Time spent with patient: 1 hour  Date of Admission:  04/30/2022 Date of Discharge: 05/08/2022  Reason for Admission:  Bryan Flowers is a 61 year old white male who presents to the emergency room with depression and suicidal ideation.  He is having a lot of financial problems and states that this is the fourth anniversary of his wife passing away.  He recently broke up with his girlfriend and states that they were supposed to get married.  He is feeling hopeless and helpless.  He does see a psychiatrist in [place] at [facility].  He is currently living in an apartment by himself and endorses anhedonia, difficulty sleeping, anxiety, depressed mood and suicidal thoughts.  He has never been psychiatrically hospitalized in the past.  No past suicide attempts.  He has done well with Lamictal Cymbalta and Seroquel.  He is having trouble financially paying for his medications and keeping up with his utility bills.  He has a history of Parkinson's disease and sees a neurologist at [facility].  He states that he has kept up with his psychiatric medications but not his hypertensive and diabetic medicines which is evidenced by his lab work.   PER INITIAL INTAKE: Pt presents to ER from home with c/o severe depression, and suicidal thoughts.  Pt states he has not been able to take his meds in last 2-3 weeks d/t affordability.  Pt states he has gone through recent stress in his life with pt and his s/o breaking up, and problems with paying bills.  Pt states he has hx of depression, BPD and parkinson's.  Pt endorses SI without plan, but denies HI, AVH, and states he has not been able to eat and sleep much lately.  Pt otherwise A&O x4 at this time in NAD.  Principal Problem: MDD (major depressive  disorder), recurrent, severe, with psychosis ([place]) Discharge Diagnoses: Principal Problem:   MDD (major depressive disorder), recurrent, severe, with psychosis ([place]) Active Problems:   MDD (major depressive disorder)   Past Psychiatric History:  He is currently seeing a psychiatrist in [place] at [facility].  He has done well on Seroquel, Cymbalta, and Lamictal and takes Ambien as needed.  He has one visit to the emergency room back in 2018 for depression.  No past admissions.    Past Medical History:  Past Medical History:  Diagnosis Date   Anemia    iron deficiency   Anxiety    Bipolar 1 disorder, manic, moderate ([place])    Blood transfusion without reported diagnosis    due to GI bleeding-ulcers   Claudication ([place]) 07/16/2017   Depression    Diabetes mellitus without complication (HCC)    GERD (gastroesophageal reflux disease)    Hyperlipidemia    Hypertension    Insomnia    Parkinson disease (HCC)    Peptic ulcer disease with hemorrhage    Sleep apnea    on cpap    Past Surgical History:  Procedure Laterality Date   ESOPHAGOGASTRODUODENOSCOPY Left 03/16/2016   Procedure: ESOPHAGOGASTRODUODENOSCOPY (EGD);  Surgeon: Manus Gunning, MD;  Location: [facility];  Service: Gastroenterology;  Laterality: Left;   FOOT FRACTURE SURGERY Right    Family History:  Family History  Problem Relation Age of Onset   Cancer Mother  Heart disease Father    Arthritis Brother    Colon polyps Neg Hx    Colon cancer Neg Hx    Esophageal cancer Neg Hx    Rectal cancer Neg Hx    Stomach cancer Neg Hx    Family Psychiatric  History: Unremarkable Social History:  Social History   Substance and Sexual Activity  Alcohol Use No   Alcohol/week: 0.0 standard drinks of alcohol     Social History   Substance and Sexual Activity  Drug Use No    Social History   Socioeconomic History   Marital status: Widowed    Spouse name: Not on file   Number of  children: 0   Years of education: Not on file   Highest education level: High school graduate  Occupational History    Employer: DISABLED  Tobacco Use   Smoking status: Never   Smokeless tobacco: Never   Tobacco comments:    Non-smoker  Vaping Use   Vaping Use: Never used  Substance and Sexual Activity   Alcohol use: No    Alcohol/week: 0.0 standard drinks of alcohol   Drug use: No   Sexual activity: Not Currently    Partners: Female  Other Topics Concern   Not on file  Social History Narrative   Pt lives alone.    Social Determinants of Health   Financial Resource Strain: Low Risk  (02/25/2021)   Overall Financial Resource Strain (CARDIA)    Difficulty of Paying Living Expenses: Not hard at all  Food Insecurity: No Food Insecurity (02/25/2021)   Hunger Vital Sign    Worried About Running Out of Food in the Last Year: Never true    Ran Out of Food in the Last Year: Never true  Transportation Needs: No Transportation Needs (02/25/2021)   PRAPARE - Hydrologist (Medical): No    Lack of Transportation (Non-Medical): No  Physical Activity: Sufficiently Active (02/25/2021)   Exercise Vital Sign    Days of Exercise per Week: 7 days    Minutes of Exercise per Session: 30 min  Stress: No Stress Concern Present (02/25/2021)   [place]    Feeling of Stress : Only a little  Social Connections: Moderately Isolated (02/25/2021)   Social Connection and Isolation Panel [NHANES]    Frequency of Communication with Friends and Family: More than three times a week    Frequency of Social Gatherings with Friends and Family: More than three times a week    Attends Religious Services: More than 4 times per year    Active Member of [employer] or Organizations: No    Attends Archivist Meetings: Never    Marital Status: Widowed    Hospital Course: [name] was admitted on a voluntary basis under  routine orders and precautions.  He was having a lot of financial problems and getting very down and depressed.  He was not taking his insulin correctly and his blood sugars were high.  There was a diabetic nurse consult who made recommendations for his diabetes.  His blood sugars normalized.  This morning's blood sugar was 121.  He had a fainting episode and a CT of his head was ordered which was unremarkable.  I adjusted his medications and decreased his Seroquel to 200 mg at bedtime and trazodone to 50 mg at bedtime and discontinued his Lamictal.  He was pleasant and cooperative on the unit did not have any more  episodes.  He was compliant with his medications and denies any side effects.  He states that his mood was getting better.  I told him that sometimes your blood sugars get that high it can also mess with your mood.  It was felt that he maximize hospitalization he was discharged home.  On the day of discharge she denied suicidal ideation, homicidal ideation, auditory or visual hallucinations.  His judgment and insight were good.  Physical Findings: AIMS:  , ,  ,  ,    CIWA:    COWS:     Musculoskeletal: Strength & Muscle Tone: within normal limits Gait & Station: normal Patient leans: N/A   Psychiatric Specialty Exam:  Presentation  General Appearance: Appropriate for Environment  Eye Contact:Good  Speech:Clear and Coherent  Speech Volume:Normal  Handedness:No data recorded  Mood and Affect  Mood:Depressed  Affect:Congruent   Thought Process  Thought Processes:Coherent  Descriptions of Associations:Intact  Orientation:Full (Time, Place and Person)  Thought Content:WDL  History of Schizophrenia/Schizoaffective disorder:No  Duration of Psychotic Symptoms:No data recorded Hallucinations:No data recorded Ideas of Reference:None  Suicidal Thoughts:No data recorded Homicidal Thoughts:No data recorded  Sensorium  Memory:Immediate Good; Recent Good; Remote  Good  Judgment:Good  Insight:Good   Executive Functions  Concentration:Good  Attention Span:Good  Recall:Good  Fund of Knowledge:Good  Language:Good   Psychomotor Activity  Psychomotor Activity:No data recorded  Assets  Assets:Communication Skills; Desire for Improvement; Financial Resources/Insurance; Housing; Resilience   Sleep  Sleep:No data recorded   Physical Exam: Physical Exam Vitals and nursing note reviewed.  Constitutional:      Appearance: Normal appearance. He is normal weight.  Neurological:     General: No focal deficit present.     Mental Status: He is alert and oriented to person, place, and time.  Psychiatric:        Attention and Perception: Attention and perception normal.        Mood and Affect: Mood and affect normal.        Speech: Speech normal.        Behavior: Behavior normal. Behavior is cooperative.        Thought Content: Thought content normal.        Cognition and Memory: Cognition and memory normal.        Judgment: Judgment normal.    Review of Systems  Constitutional: Negative.   HENT: Negative.    Eyes: Negative.   Respiratory: Negative.    Cardiovascular: Negative.   Gastrointestinal: Negative.   Genitourinary: Negative.   Musculoskeletal: Negative.   Skin: Negative.   Neurological: Negative.   Endo/Heme/Allergies: Negative.   Psychiatric/Behavioral: Negative.     Blood pressure (!) 141/85, pulse 84, temperature 98.1 F (36.7 C), resp. rate 18, height '5\' 11"'$  (1.803 m), weight 81.2 kg, SpO2 99 %. Body mass index is 24.97 kg/m.   Social History   Tobacco Use  Smoking Status Never  Smokeless Tobacco Never  Tobacco Comments   Non-smoker   Tobacco Cessation:  N/A, patient does not currently use tobacco products   Blood Alcohol level:  Lab Results  Component Value Date   ETH <10 04/28/2022   ETH <5 24/26/8341    Metabolic Disorder Labs:  Lab Results  Component Value Date   HGBA1C 12.1 (H) 04/28/2022    MPG 300.57 04/28/2022   MPG 280 09/15/2021   No results found for: "PROLACTIN" Lab Results  Component Value Date   CHOL 247 (H) 09/15/2021   TRIG 307 (H) 09/15/2021  HDL 44 09/15/2021   CHOLHDL 5.6 (H) 09/15/2021   LDLCALC 156 (H) 09/15/2021   LDLCALC 129 (H) 05/15/2021    See Psychiatric Specialty Exam and Suicide Risk Assessment completed by Attending Physician prior to discharge.  Discharge destination:  Home  Is patient on multiple antipsychotic therapies at discharge:  No   Has Patient had three or more failed trials of antipsychotic monotherapy by history:  No  Recommended Plan for Multiple Antipsychotic Therapies: NA   Allergies as of 05/08/2022   No Known Allergies      Medication List     STOP taking these medications    acetaminophen 500 MG tablet Commonly known as: TYLENOL   Armodafinil 250 MG tablet   Insulin Pen Needle 32G X 6 MM Misc Commonly known as: NovoFine   lamoTRIgine 200 MG tablet Commonly known as: LAMICTAL   LORazepam 1 MG tablet Commonly known as: ATIVAN   quinapril 20 MG tablet Commonly known as: Accupril   Rybelsus 7 MG Tabs Generic drug: Semaglutide   Tresiba FlexTouch 200 UNIT/ML FlexTouch Pen Generic drug: insulin degludec   zolpidem 12.5 MG CR tablet Commonly known as: AMBIEN CR       TAKE these medications      Indication  amLODipine 5 MG tablet Commonly known as: NORVASC Take 1 tablet (5 mg total) by mouth daily.    b complex vitamins capsule Take 1 capsule by mouth daily.    carbidopa-levodopa 50-200 MG tablet Commonly known as: SINEMET CR Take 1 tablet by mouth 3 (three) times daily.    cholecalciferol 25 MCG (1000 UNIT) tablet Commonly known as: VITAMIN D3 Take 1,000 Units by mouth daily.    DULoxetine 60 MG capsule Commonly known as: CYMBALTA Take 120 mg by mouth at bedtime. What changed: Another medication with the same name was added. Make sure you understand how and when to take each.     DULoxetine 60 MG capsule Commonly known as: CYMBALTA Take 1 capsule (60 mg total) by mouth daily. Start taking on: May 09, 2022 What changed: You were already taking a medication with the same name, and this prescription was added. Make sure you understand how and when to take each.  Indication: Major Depressive Disorder   glucose blood test strip Check once daily fasting, and up to 3 times a day as needed.    OneTouch Verio test strip Generic drug: glucose blood DX:E11.69, LON:99 Check TID    insulin aspart 100 UNIT/ML injection Commonly known as: novoLOG Inject 6 Units into the skin 3 (three) times daily with meals.    insulin glargine-yfgn 100 UNIT/ML injection Commonly known as: SEMGLEE Inject 0.15 mLs (15 Units total) into the skin daily. Start taking on: May 09, 2022    lisinopril 20 MG tablet Commonly known as: ZESTRIL Take 1 tablet (20 mg total) by mouth daily. Start taking on: May 09, 2022  Indication: High Blood Pressure Disorder   metFORMIN 1000 MG tablet Commonly known as: GLUCOPHAGE Take 1 tablet (1,000 mg total) by mouth 2 (two) times daily with a meal.  Indication: Type 2 Diabetes   pregabalin 50 MG capsule Commonly known as: LYRICA Take 1 capsule (50 mg total) by mouth 2 (two) times daily.    propranolol ER 60 MG 24 hr capsule Commonly known as: INDERAL LA Take 60 mg by mouth at bedtime.    QUEtiapine 200 MG tablet Commonly known as: SEROQUEL Take 1 tablet (200 mg total) by mouth at bedtime. What changed:  medication strength how much to take  Indication: Major Depressive Disorder   rOPINIRole 2 MG tablet Commonly known as: REQUIP Take 2 mg by mouth 2 (two) times daily.    rosuvastatin 40 MG tablet Commonly known as: Crestor Take 1 tablet (40 mg total) by mouth at bedtime.    traZODone 50 MG tablet Commonly known as: DESYREL Take 1 tablet (50 mg total) by mouth at bedtime as needed for sleep.  Indication: Trouble Sleeping, Major  Depressive Disorder        [place], [facility] Follow up.   Specialty: Behavioral Health Why: You have an appointment scheduled with your provider, Dorethea Clan, PMHNP, Thursday November 2nd, 08/06/22 at 11:20am. Please contact office to reschedule as needed for any cancellations to be seen sooner. Contact information: [address] [place] [address] [place], [facility] Follow up.   Specialty: Behavioral Health Why: Please contact your therapist directly today, 05/08/22 to schedule your follow up appointment. Thanks! Contact information: [address] [place] [address] [phone]                 Follow-up recommendations:  [facility]     Signed: Parks Ranger, DO 05/08/2022, 11:11 AM

## 2022-05-08 NOTE — Progress Notes (Signed)
Patient is alert and oriented times 4. Mood and affect appropriate. Patient denies pain. He denies SI, HI, and AVH. Also denies feelings of anxiety and depression at this time. States he slept good last night. Morning meds given whole by mouth W/O difficulty. Blood sugar covered with insulin as scheduled. Ate breakfast in day room- appetite good. Patient remains on unit with Q15 minute checks in place.

## 2022-05-08 NOTE — Plan of Care (Signed)
  Problem: Education: Goal: Knowledge of General Education information will improve Description: Including pain rating scale, medication(s)/side effects and non-pharmacologic comfort measures Outcome: Adequate for Discharge   Problem: Health Behavior/Discharge Planning: Goal: Ability to manage health-related needs will improve Outcome: Adequate for Discharge   Problem: Clinical Measurements: Goal: Ability to maintain clinical measurements within normal limits will improve Outcome: Adequate for Discharge Goal: Will remain free from infection Outcome: Adequate for Discharge Goal: Diagnostic test results will improve Outcome: Adequate for Discharge Goal: Respiratory complications will improve Outcome: Adequate for Discharge Goal: Cardiovascular complication will be avoided Outcome: Adequate for Discharge   Problem: Activity: Goal: Risk for activity intolerance will decrease Outcome: Adequate for Discharge   Problem: Nutrition: Goal: Adequate nutrition will be maintained Outcome: Adequate for Discharge   Problem: Coping: Goal: Level of anxiety will decrease Outcome: Adequate for Discharge   Problem: Elimination: Goal: Will not experience complications related to bowel motility Outcome: Adequate for Discharge Goal: Will not experience complications related to urinary retention Outcome: Adequate for Discharge   Problem: Pain Managment: Goal: General experience of comfort will improve Outcome: Adequate for Discharge   Problem: Safety: Goal: Ability to remain free from injury will improve Outcome: Adequate for Discharge   Problem: Skin Integrity: Goal: Risk for impaired skin integrity will decrease Outcome: Adequate for Discharge   Problem: Education: Goal: Knowledge of [facility] Education information/materials will improve Outcome: Adequate for Discharge Goal: Emotional status will improve Outcome: Adequate for Discharge Goal: Mental status will  improve Outcome: Adequate for Discharge Goal: Verbalization of understanding the information provided will improve Outcome: Adequate for Discharge   Problem: Activity: Goal: Interest or engagement in activities will improve Outcome: Adequate for Discharge Goal: Sleeping patterns will improve Outcome: Adequate for Discharge   Problem: Coping: Goal: Ability to verbalize frustrations and anger appropriately will improve Outcome: Adequate for Discharge Goal: Ability to demonstrate self-control will improve Outcome: Adequate for Discharge   Problem: Health Behavior/Discharge Planning: Goal: Identification of resources available to assist in meeting health care needs will improve Outcome: Adequate for Discharge Goal: Compliance with treatment plan for underlying cause of condition will improve Outcome: Adequate for Discharge   Problem: Physical Regulation: Goal: Ability to maintain clinical measurements within normal limits will improve Outcome: Adequate for Discharge   Problem: Safety: Goal: Periods of time without injury will increase Outcome: Adequate for Discharge   Problem: Education: Goal: Utilization of techniques to improve thought processes will improve Outcome: Adequate for Discharge Goal: Knowledge of the prescribed therapeutic regimen will improve Outcome: Adequate for Discharge   Problem: Activity: Goal: Interest or engagement in leisure activities will improve Outcome: Adequate for Discharge Goal: Imbalance in normal sleep/wake cycle will improve Outcome: Adequate for Discharge   Problem: Coping: Goal: Coping ability will improve Outcome: Adequate for Discharge Goal: Will verbalize feelings Outcome: Adequate for Discharge   Problem: Health Behavior/Discharge Planning: Goal: Ability to make decisions will improve Outcome: Adequate for Discharge Goal: Compliance with therapeutic regimen will improve Outcome: Adequate for Discharge   Problem: Safety: Goal:  Ability to disclose and discuss suicidal ideas will improve Outcome: Adequate for Discharge Goal: Ability to identify and utilize support systems that promote safety will improve Outcome: Adequate for Discharge   Problem: Self-Concept: Goal: Will verbalize positive feelings about self Outcome: Adequate for Discharge Goal: Level of anxiety will decrease Outcome: Adequate for Discharge

## 2022-05-08 NOTE — Progress Notes (Signed)
  [facility] Adult Case Management Discharge Plan :  Will you be returning to the same living situation after discharge:  Yes,  pt will be returning home At discharge, do you have transportation home?: Yes,  pt's brother is providing transportation Do you have the ability to pay for your medications: Yes,  pt has [employer] medicare  Release of information consent forms completed and in the chart;  Patient's signature needed at discharge.  Patient to Follow up at:  [place], [facility] Follow up.   Specialty: Behavioral Health Why: You have an appointment scheduled with your provider, Dorethea Clan, PMHNP, Thursday November 2nd, 08/06/22 at 11:20am. Please contact office to reschedule as needed for any cancellations to be seen sooner. Contact information: [address] [place] [address] [place], [facility] Follow up.   Specialty: Behavioral Health Why: Please contact your therapist directly today, 05/08/22 to schedule your follow up appointment within the next week. Thanks! Contact information: [place] [address] [place] [address] [phone]                 Next level of care provider has access to [place] and Suicide Prevention discussed: Yes,  SPE completed with pt's brother     Has patient been referred to the Quitline?: N/A patient is not a smoker  Patient has been referred for addiction treatment: N/A  Alexey Rhoads A [place], [place] 05/08/2022, 11:36 AM

## 2022-05-08 NOTE — BHH Suicide Risk Assessment (Signed)
[facility] Discharge Suicide Risk Assessment   Principal Problem: MDD (major depressive disorder), recurrent, severe, with psychosis ([place]) Discharge Diagnoses: Principal Problem:   MDD (major depressive disorder), recurrent, severe, with psychosis ([place]) Active Problems:   MDD (major depressive disorder)   Total Time spent with patient: 1 hour  Musculoskeletal: Strength & Muscle Tone: within normal limits Gait & Station: normal Patient leans: N/A  Psychiatric Specialty Exam  Presentation  General Appearance: Appropriate for Environment  Eye Contact:Good  Speech:Clear and Coherent  Speech Volume:Normal  Handedness:No data recorded  Mood and Affect  Mood:Depressed  Duration of Depression Symptoms: Greater than two weeks  Affect:Congruent   Thought Process  Thought Processes:Coherent  Descriptions of Associations:Intact  Orientation:Full (Time, Place and Person)  Thought Content:WDL  History of Schizophrenia/Schizoaffective disorder:No  Duration of Psychotic Symptoms:No data recorded Hallucinations:No data recorded Ideas of Reference:None  Suicidal Thoughts:No data recorded Homicidal Thoughts:No data recorded  Sensorium  Memory:Immediate Good; Recent Good; Remote Good  Judgment:Good  Insight:Good   Executive Functions  Concentration:Good  Attention Span:Good  [place] of Knowledge:Good  Language:Good   Psychomotor Activity  Psychomotor Activity:No data recorded  Assets  Assets:Communication Skills; Desire for Improvement; Financial Resources/Insurance; Housing; Resilience   Sleep  Sleep:No data recorded   Blood pressure (!) 141/85, pulse 84, temperature 98.1 F (36.7 C), resp. rate 18, height '5\' 11"'$  (1.803 m), weight 81.2 kg, SpO2 99 %. Body mass index is 24.97 kg/m.  Mental Status Per Nursing Assessment::   On Admission:  NA  Demographic Factors:  Male, Caucasian, and Living alone  Loss Factors: Loss of significant  relationship  Historical Factors: NA  Risk Reduction Factors:   Positive coping skills or problem solving skills  Continued Clinical Symptoms:  Depression:   Anhedonia  Cognitive Features That Contribute To Risk:  None    Suicide Risk:  Minimal: No identifiable suicidal ideation.  Patients presenting with no risk factors but with morbid ruminations; may be classified as minimal risk based on the severity of the depressive symptoms   [place] Follow up.   Specialty: Behavioral Health Why: You have an appointment scheduled with your provider, Dorethea Clan, PMHNP, Thursday November 2nd, 08/06/22 at 11:20am. Please contact office to reschedule as needed for any cancellations to be seen sooner. Contact information: [address] [place] [address] [place], [facility] Follow up.   Specialty: Behavioral Health Why: Please contact your therapist directly today, 05/08/22 to schedule your follow up appointment. Thanks! Contact information: [place] [address] [phone]                 Plan Of Care/Follow-up recommendations: [place], DO 05/08/2022, 10:52 AM

## 2022-05-08 NOTE — BHH Counselor (Addendum)
CSW received contact from pt's therapist, Cherie Ouch, regarding discharge. She provided direct phone number, [phone], to CSW to provide direct number to pt to follow up. CSW will reach out to pt to inform him of information to call today, Friday 05/08/22 for follow up.  Deshanda Molitor [place], MSW, LCSW-A 8/4/20232:43 PM

## 2022-05-08 NOTE — Plan of Care (Signed)
  Problem: Coping: Goal: Level of anxiety will decrease Outcome: Progressing   Problem: Safety: Goal: Ability to remain free from injury will improve Outcome: Progressing   Problem: Education: Goal: Emotional status will improve Outcome: Progressing   Problem: Coping: Goal: Ability to verbalize frustrations and anger appropriately will improve Outcome: Progressing   Problem: Safety: Goal: Periods of time without injury will increase Outcome: Progressing

## 2022-05-08 NOTE — BHH Counselor (Signed)
CSW contacted pt but there was no answer. CSW reached out to pt's brother, Tredarius Cobern, [phone], and was able to inform brother regarding therapist, Arlana Hove direct contact number for pt to reach out for follow up.   Oriya Kettering [place], MSW, LCSW-A 8/4/20232:45 PM

## 2022-05-08 NOTE — Progress Notes (Signed)
Patient pleasant and cooperative this shift. In room reading book, no complaints voiced. Denies SI, HI, AVH. Reports mood is improved. Encouragement and support provided, safety checks maintained. Medications given as prescribed. Sleeping well without prn. Pt remains safe on unit with q 15 min checks.

## 2022-09-08 DIAGNOSIS — G4726 Circadian rhythm sleep disorder, shift work type: Secondary | ICD-10-CM | POA: Diagnosis not present

## 2022-09-08 DIAGNOSIS — F3181 Bipolar II disorder: Secondary | ICD-10-CM | POA: Diagnosis not present

## 2022-09-08 DIAGNOSIS — G2581 Restless legs syndrome: Secondary | ICD-10-CM | POA: Diagnosis not present

## 2022-12-08 ENCOUNTER — Telehealth: Payer: Self-pay | Admitting: Nurse Practitioner

## 2022-12-08 NOTE — Telephone Encounter (Signed)
Called patient to schedule Medicare Annual Wellness Visit (AWV). No voicemail available to leave a message.  Last date of AWV: 02/25/2021  Please schedule an appointment at any time with [facility].  If any questions, please contact me at [phone].  Thank you ,  [place] Direct Dial: [phone]

## 2023-01-29 ENCOUNTER — Encounter (HOSPITAL_COMMUNITY): Payer: Self-pay | Admitting: Hematology

## 2023-03-09 DIAGNOSIS — G2581 Restless legs syndrome: Secondary | ICD-10-CM | POA: Diagnosis not present

## 2023-03-09 DIAGNOSIS — F3181 Bipolar II disorder: Secondary | ICD-10-CM | POA: Diagnosis not present

## 2023-06-30 ENCOUNTER — Inpatient Hospital Stay
Admission: EM | Admit: 2023-06-30 | Discharge: 2023-07-02 | DRG: 638 | Disposition: A | Discharge status: Home / Self Care | Payer: Medicare HMO | Attending: Internal Medicine | Admitting: Internal Medicine

## 2023-06-30 ENCOUNTER — Inpatient Hospital Stay: Payer: Medicare HMO

## 2023-06-30 ENCOUNTER — Emergency Department: Payer: Medicare HMO

## 2023-06-30 ENCOUNTER — Encounter (HOSPITAL_COMMUNITY): Payer: Self-pay | Admitting: Hematology

## 2023-06-30 DIAGNOSIS — F3112 Bipolar disorder, current episode manic without psychotic features, moderate: Secondary | ICD-10-CM | POA: Diagnosis present

## 2023-06-30 DIAGNOSIS — F419 Anxiety disorder, unspecified: Secondary | ICD-10-CM | POA: Diagnosis present

## 2023-06-30 DIAGNOSIS — W19XXXA Unspecified fall, initial encounter: Secondary | ICD-10-CM | POA: Diagnosis not present

## 2023-06-30 DIAGNOSIS — Z8261 Family history of arthritis: Secondary | ICD-10-CM | POA: Diagnosis not present

## 2023-06-30 DIAGNOSIS — R296 Repeated falls: Secondary | ICD-10-CM | POA: Diagnosis present

## 2023-06-30 DIAGNOSIS — Z1152 Encounter for screening for COVID-19: Secondary | ICD-10-CM

## 2023-06-30 DIAGNOSIS — R29818 Other symptoms and signs involving the nervous system: Secondary | ICD-10-CM | POA: Diagnosis not present

## 2023-06-30 DIAGNOSIS — K219 Gastro-esophageal reflux disease without esophagitis: Secondary | ICD-10-CM | POA: Diagnosis present

## 2023-06-30 DIAGNOSIS — G20A1 Parkinson's disease without dyskinesia, without mention of fluctuations: Secondary | ICD-10-CM | POA: Diagnosis not present

## 2023-06-30 DIAGNOSIS — R739 Hyperglycemia, unspecified: Secondary | ICD-10-CM

## 2023-06-30 DIAGNOSIS — Z794 Long term (current) use of insulin: Secondary | ICD-10-CM | POA: Diagnosis not present

## 2023-06-30 DIAGNOSIS — I2489 Other forms of acute ischemic heart disease: Secondary | ICD-10-CM | POA: Diagnosis not present

## 2023-06-30 DIAGNOSIS — Z79899 Other long term (current) drug therapy: Secondary | ICD-10-CM

## 2023-06-30 DIAGNOSIS — I10 Essential (primary) hypertension: Secondary | ICD-10-CM | POA: Diagnosis present

## 2023-06-30 DIAGNOSIS — Z8711 Personal history of peptic ulcer disease: Secondary | ICD-10-CM | POA: Diagnosis not present

## 2023-06-30 DIAGNOSIS — Z809 Family history of malignant neoplasm, unspecified: Secondary | ICD-10-CM

## 2023-06-30 DIAGNOSIS — G4700 Insomnia, unspecified: Secondary | ICD-10-CM | POA: Diagnosis present

## 2023-06-30 DIAGNOSIS — Z23 Encounter for immunization: Secondary | ICD-10-CM

## 2023-06-30 DIAGNOSIS — R350 Frequency of micturition: Secondary | ICD-10-CM | POA: Diagnosis present

## 2023-06-30 DIAGNOSIS — Z7984 Long term (current) use of oral hypoglycemic drugs: Secondary | ICD-10-CM

## 2023-06-30 DIAGNOSIS — G9389 Other specified disorders of brain: Secondary | ICD-10-CM | POA: Diagnosis not present

## 2023-06-30 DIAGNOSIS — Z9181 History of falling: Secondary | ICD-10-CM

## 2023-06-30 DIAGNOSIS — D509 Iron deficiency anemia, unspecified: Secondary | ICD-10-CM | POA: Diagnosis not present

## 2023-06-30 DIAGNOSIS — R9089 Other abnormal findings on diagnostic imaging of central nervous system: Secondary | ICD-10-CM | POA: Diagnosis not present

## 2023-06-30 DIAGNOSIS — E119 Type 2 diabetes mellitus without complications: Secondary | ICD-10-CM

## 2023-06-30 DIAGNOSIS — E785 Hyperlipidemia, unspecified: Secondary | ICD-10-CM | POA: Diagnosis present

## 2023-06-30 DIAGNOSIS — G4733 Obstructive sleep apnea (adult) (pediatric): Secondary | ICD-10-CM | POA: Diagnosis present

## 2023-06-30 DIAGNOSIS — E1110 Type 2 diabetes mellitus with ketoacidosis without coma: Secondary | ICD-10-CM | POA: Diagnosis not present

## 2023-06-30 DIAGNOSIS — R42 Dizziness and giddiness: Secondary | ICD-10-CM | POA: Diagnosis not present

## 2023-06-30 DIAGNOSIS — R531 Weakness: Secondary | ICD-10-CM | POA: Diagnosis not present

## 2023-06-30 DIAGNOSIS — E1165 Type 2 diabetes mellitus with hyperglycemia: Secondary | ICD-10-CM | POA: Diagnosis present

## 2023-06-30 DIAGNOSIS — R319 Hematuria, unspecified: Secondary | ICD-10-CM | POA: Diagnosis present

## 2023-06-30 DIAGNOSIS — Z8249 Family history of ischemic heart disease and other diseases of the circulatory system: Secondary | ICD-10-CM

## 2023-06-30 DIAGNOSIS — R0989 Other specified symptoms and signs involving the circulatory and respiratory systems: Secondary | ICD-10-CM | POA: Diagnosis not present

## 2023-06-30 DIAGNOSIS — E1151 Type 2 diabetes mellitus with diabetic peripheral angiopathy without gangrene: Secondary | ICD-10-CM | POA: Diagnosis not present

## 2023-06-30 LAB — HEMOGLOBIN A1C
Hgb A1c MFr Bld: 13.9 % — ABNORMAL HIGH (ref 4.8–5.6)
Mean Plasma Glucose: 352.23 mg/dL

## 2023-06-30 LAB — URINALYSIS, ROUTINE W REFLEX MICROSCOPIC
Bacteria, UA: NONE SEEN
Bilirubin Urine: NEGATIVE
Glucose, UA: >=500 mg/dL — AB
Ketones, ur: 5 mg/dL — AB
Leukocytes,Ua: NEGATIVE
Nitrite: NEGATIVE
Protein, ur: NEGATIVE mg/dL
RBC / HPF: 0 RBC/hpf (ref 0–5)
Specific Gravity, Urine: 1.027 (ref 1.005–1.030)
Squamous Epithelial / HPF: 0 /HPF (ref 0–5)
pH: 5 (ref 5.0–8.0)

## 2023-06-30 LAB — CBC
HCT: 40.9 % (ref 39.0–52.0)
Hemoglobin: 14 g/dL (ref 13.0–17.0)
MCH: 29.1 pg (ref 26.0–34.0)
MCHC: 34.2 g/dL (ref 30.0–36.0)
MCV: 85 fL (ref 80.0–100.0)
Platelets: 292 10*3/uL (ref 150–400)
RBC: 4.81 MIL/uL (ref 4.22–5.81)
RDW: 13.2 % (ref 11.5–15.5)
WBC: 10.4 10*3/uL (ref 4.0–10.5)
nRBC: 0 % (ref 0.0–0.2)

## 2023-06-30 LAB — BASIC METABOLIC PANEL
Anion gap: 12 (ref 5–15)
Anion gap: 13 (ref 5–15)
BUN: 31 mg/dL — ABNORMAL HIGH (ref 8–23)
BUN: 21 mg/dL (ref 8–23)
CO2: 22 mmol/L (ref 22–32)
CO2: 24 mmol/L (ref 22–32)
Calcium: 8.8 mg/dL — ABNORMAL LOW (ref 8.9–10.3)
Calcium: 8.9 mg/dL (ref 8.9–10.3)
Chloride: 98 mmol/L (ref 98–111)
Chloride: 99 mmol/L (ref 98–111)
Creatinine, Ser: 1.02 mg/dL (ref 0.61–1.24)
Creatinine, Ser: 0.83 mg/dL (ref 0.61–1.24)
GFR, Estimated: >60 mL/min (ref >60)
GFR, Estimated: >60 mL/min (ref >60)
Glucose, Bld: 506 mg/dL (ref 70–99)
Glucose, Bld: 201 mg/dL — ABNORMAL HIGH (ref 70–99)
Potassium: 4.3 mmol/L (ref 3.5–5.1)
Potassium: 3.7 mmol/L (ref 3.5–5.1)
Sodium: 132 mmol/L — ABNORMAL LOW (ref 135–145)
Sodium: 136 mmol/L (ref 135–145)

## 2023-06-30 LAB — TROPONIN I (HIGH SENSITIVITY)
Troponin I (High Sensitivity): 44 ng/L — ABNORMAL HIGH (ref <18)
Troponin I (High Sensitivity): 35 ng/L — ABNORMAL HIGH (ref <18)

## 2023-06-30 LAB — BETA-HYDROXYBUTYRIC ACID
Beta-Hydroxybutyric Acid: 1.87 mmol/L — ABNORMAL HIGH (ref 0.05–0.27)
Beta-Hydroxybutyric Acid: 0.5 mmol/L — ABNORMAL HIGH (ref 0.05–0.27)

## 2023-06-30 LAB — CBG MONITORING, ED
Glucose-Capillary: 481 mg/dL — ABNORMAL HIGH (ref 70–99)
Glucose-Capillary: 307 mg/dL — ABNORMAL HIGH (ref 70–99)
Glucose-Capillary: 309 mg/dL — ABNORMAL HIGH (ref 70–99)
Glucose-Capillary: 215 mg/dL — ABNORMAL HIGH (ref 70–99)

## 2023-06-30 LAB — SARS CORONAVIRUS 2 BY RT PCR: SARS Coronavirus 2 by RT PCR: NEGATIVE

## 2023-06-30 MED ORDER — MECLIZINE HCL 25 MG PO TABS
25.0000 mg | ORAL_TABLET | Freq: Once | ORAL | Status: AC
  Administered 2023-06-30: 25 mg via ORAL

## 2023-06-30 MED ORDER — DULOXETINE HCL 30 MG PO CPEP
60.0000 mg | ORAL_CAPSULE | Freq: Every day | ORAL | Status: DC
  Administered 2023-07-01 – 2023-07-02 (×2): 60 mg via ORAL
  Filled 2023-06-30 (×2): qty 2

## 2023-06-30 MED ORDER — INSULIN ASPART 100 UNIT/ML IJ SOLN
8.0000 [IU] | Freq: Once | INTRAMUSCULAR | Status: AC
  Administered 2023-06-30: 8 [IU] via INTRAVENOUS
  Filled 2023-06-30: qty 1

## 2023-06-30 MED ORDER — LAMOTRIGINE 25 MG PO TABS
200.0000 mg | ORAL_TABLET | Freq: Every day | ORAL | Status: DC
  Administered 2023-07-01 – 2023-07-02 (×2): 200 mg via ORAL
  Filled 2023-06-30: qty 2
  Filled 2023-06-30: qty 8

## 2023-06-30 MED ORDER — LACTATED RINGERS IV SOLN: INTRAVENOUS | Status: AC

## 2023-06-30 MED ORDER — LACTATED RINGERS IV BOLUS
1000.0000 mL | Freq: Once | INTRAVENOUS | Status: AC
  Administered 2023-06-30: 1000 mL via INTRAVENOUS

## 2023-06-30 MED ORDER — ROPINIROLE HCL 1 MG PO TABS
2.0000 mg | ORAL_TABLET | Freq: Two times a day (BID) | ORAL | Status: DC
  Administered 2023-06-30 – 2023-07-02 (×4): 2 mg via ORAL
  Filled 2023-06-30 (×4): qty 2

## 2023-06-30 MED ORDER — LORAZEPAM 1 MG PO TABS
1.0000 mg | ORAL_TABLET | Freq: Every day | ORAL | Status: DC
  Administered 2023-06-30 – 2023-07-01 (×2): 1 mg via ORAL
  Filled 2023-06-30 (×2): qty 1

## 2023-06-30 MED ORDER — ONDANSETRON HCL 4 MG/2ML IJ SOLN: 4.0000 mg | Freq: Four times a day (QID) | INTRAMUSCULAR | Status: DC | PRN

## 2023-06-30 MED ORDER — ENOXAPARIN SODIUM 40 MG/0.4ML IJ SOSY
40.0000 mg | PREFILLED_SYRINGE | INTRAMUSCULAR | Status: DC
  Administered 2023-06-30 – 2023-07-01 (×2): 40 mg via SUBCUTANEOUS
  Filled 2023-06-30 (×2): qty 0.4

## 2023-06-30 MED ORDER — INSULIN ASPART 100 UNIT/ML IJ SOLN
0.0000 [IU] | INTRAMUSCULAR | Status: DC
  Administered 2023-06-30: 11 [IU] via SUBCUTANEOUS
  Administered 2023-06-30: 5 [IU] via SUBCUTANEOUS
  Administered 2023-07-01: 3 [IU] via SUBCUTANEOUS
  Administered 2023-07-01: 2 [IU] via SUBCUTANEOUS
  Administered 2023-07-01: 8 [IU] via SUBCUTANEOUS
  Administered 2023-07-01: 11 [IU] via SUBCUTANEOUS
  Administered 2023-07-01: 3 [IU] via SUBCUTANEOUS
  Administered 2023-07-02: 5 [IU] via SUBCUTANEOUS
  Administered 2023-07-02 (×2): 3 [IU] via SUBCUTANEOUS
  Administered 2023-07-02: 8 [IU] via SUBCUTANEOUS
  Filled 2023-06-30 (×10): qty 1

## 2023-06-30 MED ORDER — TRAZODONE HCL 50 MG PO TABS: 50.0000 mg | ORAL_TABLET | Freq: Every evening | ORAL | Status: DC | PRN

## 2023-06-30 MED ORDER — PROPRANOLOL HCL ER 60 MG PO CP24
60.0000 mg | ORAL_CAPSULE | Freq: Every day | ORAL | Status: DC
  Administered 2023-06-30 – 2023-07-01 (×2): 60 mg via ORAL
  Filled 2023-06-30 (×2): qty 1

## 2023-06-30 MED ORDER — ACETAMINOPHEN 325 MG PO TABS: 650.0000 mg | ORAL_TABLET | Freq: Four times a day (QID) | ORAL | Status: DC | PRN

## 2023-06-30 MED ORDER — QUETIAPINE FUMARATE 300 MG PO TABS
300.0000 mg | ORAL_TABLET | Freq: Every day | ORAL | Status: DC
  Administered 2023-06-30 – 2023-07-01 (×2): 300 mg via ORAL
  Filled 2023-06-30 (×2): qty 1

## 2023-06-30 NOTE — Assessment & Plan Note (Signed)
-  CPAP at bedtime

## 2023-06-30 NOTE — H&P (Signed)
History and Physical    Patient: Bryan Flowers:[id number] DOB: 03/30/1961 DOA: 06/30/2023 DOS: the patient was seen and examined on 06/30/2023 PCP: Berniece Salines, FNP  Patient coming from: Home  Chief Complaint:  Chief Complaint  Patient presents with   Dizziness    Patient states he has been having "really bad vertigo" the last few days and has had multiple falls; CBG was 529 with EMS and patient received NaCl 500 ml; Currently takes Metformin for DM, GCS 15 upon arrival   HPI: [name] is a 62 y.o. male with medical history significant of type 2 diabetes, Parkinson's disease, bipolar 1 disorder, hypertension, hyperlipidemia, peptic ulcer disease, OSA on CPAP, who presents to the ED due to dizziness.  Mr. Roti states that over the past several days, he has been experiencing dizziness that feels like the room is spinning.  Symptoms tend to occur if he moves his head suddenly or when he is standing up or sitting down.  It has caused him to have multiple falls over the last few days but no injuries have occurred and he did not hit his head.  He endorses some associated nausea but denies any vomiting.  He denies any diarrhea that is new or abdominal pain.  He denies any focal weakness, chest pain, palpitations, shortness of breath, cough.  He states that he is currently only on metformin for his diabetes and believes he was on long-acting insulin for total of 4-5 years but thinks it was discontinued.  He does not check his sugars regularly at home but when he does they are usually between 200-250.  He endorses urinary frequency but denies any dysuria.  He notes that he has been losing weight over the past 1 year and believes he lost at least 80 pounds.  ED course: On arrival to the ED, patient was hypertensive at 131/95 with heart rate of 108.  He was saturating at 92% on room air.  He was afebrile at 98. Initial workup notable for normal CBC, and BMP with glucose of 506, bicarb 22,  anion gap 12, creatinine 1.02 with GFR above 60.  Troponin elevated at 44 with flat trend to 35.  COVID-19 negative.  Beta hydroxy butyrate acid mildly elevated at 1.87.  Urinalysis with ketones, hematuria, glucosuria.  Chest x-ray was obtained with no acute abnormalities.  CT of the head was ordered and pending.  MRI of the brain pending.  [facility] contacted for admission.  Review of Systems: As mentioned in the history of present illness. All other systems reviewed and are negative.  Past Medical History:  Diagnosis Date   Anemia    iron deficiency   Anxiety    Bipolar 1 disorder, manic, moderate (HCC)    Blood transfusion without reported diagnosis    due to GI bleeding-ulcers   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   Depression    Diabetes mellitus without complication (HCC)    GERD (gastroesophageal reflux disease)    Hyperlipidemia    Hypertension    Insomnia    Parkinson disease (HCC)    Peptic ulcer disease with hemorrhage    Sleep apnea    on cpap   Past Surgical History:  Procedure Laterality Date   ESOPHAGOGASTRODUODENOSCOPY Left 03/16/2016   Procedure: ESOPHAGOGASTRODUODENOSCOPY (EGD);  Surgeon: Ruffin Frederick, MD;  Location: [facility] ENDOSCOPY;  Service: Gastroenterology;  Laterality: Left;   FOOT FRACTURE SURGERY Right    Social History:  reports that he has never smoked. He has never  used smokeless tobacco. He reports that he does not drink alcohol and does not use drugs.  No Known Allergies  Family History  Problem Relation Age of Onset   Cancer Mother    Heart disease Father    Arthritis Brother    Colon polyps Neg Hx    Colon cancer Neg Hx    Esophageal cancer Neg Hx    Rectal cancer Neg Hx    Stomach cancer Neg Hx     Prior to Admission medications   Medication Sig Start Date End Date Taking? Authorizing Provider  amLODipine (NORVASC) 5 MG tablet Take 1 tablet (5 mg total) by mouth daily. Patient not taking: Reported on 04/29/2022 09/15/21   Berniece Salines, FNP   b complex vitamins capsule Take 1 capsule by mouth daily. Patient not taking: Reported on 04/29/2022 03/30/16   Johney Maine, MD  carbidopa-levodopa (SINEMET CR) 50-200 MG per tablet Take 1 tablet by mouth 3 (three) times daily.  Patient not taking: Reported on 04/29/2022 03/08/15   [provider]  cholecalciferol (VITAMIN D3) 25 MCG (1000 UT) tablet Take 1,000 Units by mouth daily.    [provider]  DULoxetine (CYMBALTA) 60 MG capsule Take 120 mg by mouth at bedtime. Patient not taking: Reported on 04/29/2022 03/15/15   Ellis Savage, NP  DULoxetine (CYMBALTA) 60 MG capsule Take 1 capsule (60 mg total) by mouth daily. 05/09/22   Sarina Ill, DO  glucose blood [facility] VERIO) test strip DX:E11.69, LON:99 Check TID 06/20/20   Danelle Berry, PA-C  glucose blood test strip Check once daily fasting, and up to 3 times a day as needed. 06/18/20   Danelle Berry, PA-C  insulin aspart (NOVOLOG) 100 UNIT/ML injection Inject 6 Units into the skin 3 (three) times daily with meals. 05/08/22   Sarina Ill, DO  insulin glargine-yfgn [facility]) 100 UNIT/ML injection Inject 0.15 mLs (15 Units total) into the skin daily. 05/09/22   Sarina Ill, DO  lisinopril (ZESTRIL) 20 MG tablet Take 1 tablet (20 mg total) by mouth daily. 05/09/22   Sarina Ill, DO  metFORMIN (GLUCOPHAGE) 1000 MG tablet Take 1 tablet (1,000 mg total) by mouth 2 (two) times daily with a meal. 05/08/22   Sarina Ill, DO  pregabalin (LYRICA) 50 MG capsule Take 1 capsule (50 mg total) by mouth 2 (two) times daily. Patient not taking: Reported on 04/29/2022 09/15/21   Berniece Salines, FNP  propranolol ER (INDERAL LA) 60 MG 24 hr capsule Take 60 mg by mouth at bedtime.  02/24/15   [provider]  QUEtiapine (SEROQUEL) 200 MG tablet Take 1 tablet (200 mg total) by mouth at bedtime. 05/08/22   Sarina Ill, DO  rOPINIRole (REQUIP) 2 MG tablet Take 2 mg by mouth 2 (two)  times daily. 12/16/20   [provider]  rosuvastatin (CRESTOR) 40 MG tablet Take 1 tablet (40 mg total) by mouth at bedtime. Patient not taking: Reported on 04/29/2022 03/20/21   Danelle Berry, PA-C  traZODone (DESYREL) 50 MG tablet Take 1 tablet (50 mg total) by mouth at bedtime as needed for sleep. 05/08/22   Sarina Ill, DO    Physical Exam: Vitals:   06/30/23 1400 06/30/23 1430 06/30/23 1500 06/30/23 1530  BP: (!) 141/83 (!) 134/90 (!) 147/103 (!) 140/91  Pulse: (!) 102 99 (!) 103 (!) 101  Resp: 17 20 (!) 27 20  Temp:      SpO2: 94% 97% 92% 92%  Weight:      Height:       Physical Exam Vitals and nursing note reviewed.  Constitutional:      General: He is not in acute distress.    Appearance: He is normal weight.  HENT:     Head: Normocephalic and atraumatic.     Mouth/Throat:     Mouth: Mucous membranes are dry.     Pharynx: Oropharynx is clear.  Eyes:     General: No scleral icterus.    Extraocular Movements: Extraocular movements intact.     Conjunctiva/sclera: Conjunctivae normal.     Pupils: Pupils are equal, round, and reactive to light.  Cardiovascular:     Rate and Rhythm: Normal rate and regular rhythm.     Heart sounds: Murmur (3/6 holosystolic murmur heard throughout) heard.  Pulmonary:     Effort: Pulmonary effort is normal. No respiratory distress.     Breath sounds: Rales (Minimal rales in the bases) present. No wheezing or rhonchi.  Abdominal:     General: Bowel sounds are normal. There is no distension.     Palpations: Abdomen is soft.     Tenderness: There is no abdominal tenderness. There is no guarding.  Musculoskeletal:     Right lower leg: No edema.     Left lower leg: No edema.  Skin:    General: Skin is warm and dry.  Neurological:     Mental Status: He is alert and oriented to person, place, and time.     Comments:  Patient is alert and oriented x 3.  No dysarthria or facial asymmetry.  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  Extraocular movement intact. Cranial nerves II through X intact Sensation intact throughout 4/5 strength throughout Tremor noted. Some dysmetria with finger-to-nose bilaterally, however potentially due to tremor Normal alternating movements bilaterally  Psychiatric:        Mood and Affect: Mood normal.        Speech: Speech is delayed.    Data Reviewed: CBC with WBC of 10.4, hemoglobin 14.0, MCH 85.0, and platelets of 292.  BMP with sodium of 132, potassium 4.3, bicarb 22, glucose 506, BUN 31, creatinine 1.02 with GFR above 60.  Anion gap 12 Troponin 44 with decreased to 35 Beta hydroxybutyrate acid 1.87 COVID-19 PCR negative Urinalysis with small hematuria, ketonuria, glucosuria  EKG personally reviewed.  Sinus rhythm with rate of 110.  ST elevations in lead V1 and V2 more consistent with repolarization changes.  T wave inversion in leads V5 and V6.  Compared to EKG obtained in July 2023, T wave and ST abnormalities are chronic and unchanged.  CT HEAD WO CONTRAST ( )  Result Date: 06/30/2023 CLINICAL DATA:  Neuro deficit, acute, stroke suspected. Vertigo times a few days with multiple falls. EXAM: CT HEAD WITHOUT CONTRAST TECHNIQUE: Contiguous axial images were obtained from the base of the skull through the vertex without intravenous contrast. RADIATION DOSE REDUCTION: This exam was performed according to the departmental dose-optimization program which includes automated exposure control, adjustment of the mA and/or kV according to patient size and/or use of iterative reconstruction technique. COMPARISON:  Head CT 05/03/2022. FINDINGS: Brain: No acute hemorrhage. Unchanged mild chronic small-vessel disease. Cortical gray-white differentiation is otherwise preserved. Prominence of the ventricles and sulci within expected range for age. No hydrocephalus or extra-axial collection. No mass effect or midline shift. Vascular: No hyperdense vessel or unexpected  calcification. Skull: No calvarial fracture or suspicious bone lesion. Skull base is  unremarkable. Sinuses/Orbits: No acute finding. Other: None. IMPRESSION: 1. No acute intracranial hemorrhage or evidence of acute large vessel territory infarction. 2. Unchanged mild chronic small-vessel disease. Electronically Signed   By: Orvan Falconer M.D.   On: 06/30/2023 15:53   DG Chest 2 View  Result Date: 06/30/2023 CLINICAL DATA:  Dizziness. EXAM: CHEST - 2 VIEW COMPARISON:  Chest radiograph 05/07/2007. FINDINGS: Low lung volumes accentuate the pulmonary vasculature and cardiomediastinal silhouette. No consolidation or pulmonary edema. No pleural effusion or pneumothorax. IMPRESSION: Low lung volumes without evidence of acute cardiopulmonary disease. Electronically Signed   By: Orvan Falconer M.D.   On: 06/30/2023 13:56    Results are pending, will review when available.  Assessment and Plan:  * DKA (diabetic ketoacidosis) (HCC) Patient is presenting with several day history of vertigo, with markedly elevated CBG.  Urinalysis shows ketonuria, however bicarb is only on the lower end of normal and anion gap is 12.  Beta hydroxybutyrate acid is elevated though consistent with a mild DKA.   - Admit to progressive unit - Telemetry monitoring - SSI every 4 - N.p.o. - IV fluids as ordered - Repeat BMP and BHA in 8 hours  Vertigo Patient is presenting with dizziness that is most consistent with benign positional vertigo given symptoms only occur with movement of the head or body, however he has multiple risk factors for CVA. Differential also includes symptomatic hyperglycemia.  - MRI pending - Will consult neurology if MRI is abnormal - Management of DKA as noted above  Uncontrolled type 2 diabetes mellitus with hyperglycemia (HCC) Per chart review, patient has a history of type 2 diabetes.  At most recent visit with PCP, they noted patient was a poor historian and frequently is inconsistent with his  medications.  Last A1c available in chart elevated at 12.1% in July 2023.  - A1c pending - Diabetic coordinator consulted  Demand ischemia Minimally elevated troponin in the absence of chest pain likely due to DKA.  No further intervention at this time.  Bipolar 1 disorder, manic, moderate (HCC) - Continue home regimen  Essential hypertension - Resume home antihypertensives pending MRI results  Parkinson's disease - Resume home regimen  Obstructive sleep apnea - CPAP at bedtime  Advance Care Planning:   Code Status: Full Code verified by patient.  He states he would not want to be on long-term life support, but is amenable to a short-term resuscitative effort  Consults: None  Family Communication: Patient's brother and sister-in-law updated at bedside  Severity of Illness: The appropriate patient status for this patient is INPATIENT. Inpatient status is judged to be reasonable and necessary in order to provide the required intensity of service to ensure the patient's safety. The patient's presenting symptoms, physical exam findings, and initial radiographic and laboratory data in the context of their chronic comorbidities is felt to place them at high risk for further clinical deterioration. Furthermore, it is not anticipated that the patient will be medically stable for discharge from the hospital within 2 midnights of admission.   * I certify that at the point of admission it is my clinical judgment that the patient will require inpatient hospital care spanning beyond 2 midnights from the point of admission due to high intensity of service, high risk for further deterioration and high frequency of surveillance required.*  Author: Verdene Lennert, MD 06/30/2023 4:46 PM  For on call review [email or web address].

## 2023-06-30 NOTE — Assessment & Plan Note (Signed)
Per chart review, patient has a history of type 2 diabetes.  At most recent visit with PCP, they noted patient was a poor historian and frequently is inconsistent with his medications.  Last A1c available in chart elevated at 12.1% in July 2023.  - A1c pending - Diabetic coordinator consulted

## 2023-06-30 NOTE — Assessment & Plan Note (Signed)
Resume home regimen.

## 2023-06-30 NOTE — ED Notes (Addendum)
Pt placed on 2L Oxygen r/t desaturations to 87%. Pt currently 93%.

## 2023-06-30 NOTE — ED Notes (Signed)
ED TO INPATIENT HANDOFF REPORT  ED Nurse Name and Phone #: Tobi Bastos [phone]  S Name/Age/Gender Bryan Flowers 62 y.o. male Room/Bed: ED38A/ED38A  Code Status   Code Status: Full Code  Home/SNF/Other Home Patient oriented to: self, place, time, and situation Is this baseline? Yes   Triage Complete: Triage complete  Chief Complaint DKA (diabetic ketoacidosis) (HCC) [E11.10]  Triage Note Patient states he has been having "really bad vertigo" the last few days and has had multiple falls; CBG was 529 with EMS and patient received NaCl 500 ml; Currently takes Metformin for DM, GCS 15 upon arrival   Allergies No Known Allergies  Level of Care/Admitting Diagnosis ED Disposition     ED Disposition  Admit   Condition  --   Comment  Hospital Area: [facility] [[id number]]  Level of Care: Progressive [102]  Admit to Progressive based on following criteria: GI, ENDOCRINE disease patients with GI bleeding, acute liver failure or pancreatitis, stable with diabetic ketoacidosis or thyrotoxicosis (hypothyroid) state.  Covid Evaluation: Confirmed COVID Negative  Diagnosis: DKA (diabetic ketoacidosis) [facility]) [[id number]]  Admitting Physician: Verdene Lennert [[id number]]  Attending Physician: Verdene Lennert [phone]  Certification:: I certify this patient will need inpatient services for at least 2 midnights  Expected Medical Readiness:           B Medical/Surgery History Past Medical History:  Diagnosis Date   Anemia    iron deficiency   Anxiety    Bipolar 1 disorder, manic, moderate (HCC)    Blood transfusion without reported diagnosis    due to GI bleeding-ulcers   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   Depression    Diabetes mellitus without complication (HCC)    GERD (gastroesophageal reflux disease)    Hyperlipidemia    Hypertension    Insomnia    Parkinson disease (HCC)    Peptic ulcer disease with hemorrhage    Sleep apnea    on cpap   Past  Surgical History:  Procedure Laterality Date   ESOPHAGOGASTRODUODENOSCOPY Left 03/16/2016   Procedure: ESOPHAGOGASTRODUODENOSCOPY (EGD);  Surgeon: Ruffin Frederick, MD;  Location: [facility] ENDOSCOPY;  Service: Gastroenterology;  Laterality: Left;   FOOT FRACTURE SURGERY Right      A IV Location/Drains/Wounds Patient Lines/Drains/Airways Status     Active Line/Drains/Airways     Name Placement date Placement time Site Days   Peripheral IV 06/30/23 18 G Anterior;Left Forearm 06/30/23  --  Forearm  less than 1            Intake/Output Last 24 hours  Intake/Output Summary (Last 24 hours) at 06/30/2023 2335 Last data filed at 06/30/2023 1717 Gross per 24 hour  Intake 1000 ml  Output --  Net 1000 ml    Labs/Imaging Results for orders placed or performed during the hospital encounter of 06/30/23 (from the past 48 hour(s))  Hemoglobin A1c     Status: Abnormal   Collection Time: 06/30/23 11:55 AM  Result Value Ref Range   Hgb A1c MFr Bld 13.9 (H) 4.8 - 5.6 %    Comment: (NOTE) Pre diabetes:          5.7%-6.4%  Diabetes:              >6.4%  Glycemic control for   <7.0% adults with diabetes    Mean Plasma Glucose 352.23 mg/dL    Comment: Performed at [facility], [address], [place], [place] [address]  CBC     Status: None   Collection Time: 06/30/23  11:56 AM  Result Value Ref Range   WBC 10.4 4.0 - 10.5 K/uL   RBC 4.81 4.22 - 5.81 MIL/uL   Hemoglobin 14.0 13.0 - 17.0 g/dL   HCT 16.1 09.6 - 04.5 %   MCV 85.0 80.0 - 100.0 fL   MCH 29.1 26.0 - 34.0 pg   MCHC 34.2 30.0 - 36.0 g/dL   RDW 40.9 81.1 - 91.4 %   Platelets 292 150 - 400 K/uL   nRBC 0.0 0.0 - 0.2 %    Comment: Performed at [facility], [address] [place], [place] [address]  Basic metabolic panel     Status: Abnormal   Collection Time: 06/30/23 11:56 AM  Result Value Ref Range   Sodium 132 (L) 135 - 145 mmol/L   Potassium 4.3 3.5 - 5.1 mmol/L   Chloride 98 98 - 111 mmol/L   CO2 22  22 - 32 mmol/L   Glucose, Bld 506 (HH) 70 - 99 mg/dL    Comment: CRITICAL RESULT CALLED TO, READ BACK BY AND VERIFIED WITH TRACEY FECIORU 06/30/23 1302 MU Glucose reference range applies only to samples taken after fasting for at least 8 hours.    BUN 31 (H) 8 - 23 mg/dL   Creatinine, Ser 6.21 0.61 - 1.24 mg/dL   Calcium 8.8 (L) 8.9 - 10.3 mg/dL   GFR, Estimated >30 >86 mL/min    Comment: (NOTE) Calculated using the CKD-EPI Creatinine Equation (2021)    Anion gap 12 5 - 15    Comment: Performed at [facility], [address] [place], [place] [address]  Beta-hydroxybutyric acid     Status: Abnormal   Collection Time: 06/30/23 11:56 AM  Result Value Ref Range   Beta-Hydroxybutyric Acid 1.87 (H) 0.05 - 0.27 mmol/L    Comment: Performed at [facility], [address], [place], [place] [address]  Troponin I (High Sensitivity)     Status: Abnormal   Collection Time: 06/30/23 11:56 AM  Result Value Ref Range   Troponin I (High Sensitivity) 44 (H) <18 ng/L    Comment: (NOTE) Elevated high sensitivity troponin I (hsTnI) values and significant  changes across serial measurements may suggest ACS but many other  chronic and acute conditions are known to elevate hsTnI results.  Refer to the "Links" section for chest pain algorithms and additional  guidance. Performed at [facility], [address] [place], [place] [address]   Urinalysis, Routine w reflex microscopic -Urine, Clean Catch     Status: Abnormal   Collection Time: 06/30/23 11:59 AM  Result Value Ref Range   Color, Urine STRAW (A) YELLOW   APPearance CLEAR (A) CLEAR   Specific Gravity, Urine 1.027 1.005 - 1.030   pH 5.0 5.0 - 8.0   Glucose, UA >=500 (A) NEGATIVE mg/dL   Hgb urine dipstick SMALL (A) NEGATIVE   Bilirubin Urine NEGATIVE NEGATIVE   Ketones, ur 5 (A) NEGATIVE mg/dL   Protein, ur NEGATIVE NEGATIVE mg/dL   Nitrite NEGATIVE NEGATIVE   Leukocytes,Ua NEGATIVE NEGATIVE   RBC / HPF 0  0 - 5 RBC/hpf   WBC, UA 0-5 0 - 5 WBC/hpf   Bacteria, UA NONE SEEN NONE SEEN   Squamous Epithelial / HPF 0 0 - 5 /HPF    Comment: Performed at [facility], [address], [place], [place] [address]  CBG monitoring, ED     Status: Abnormal   Collection Time: 06/30/23 12:01 PM  Result Value Ref Range  Glucose-Capillary 481 (H) 70 - 99 mg/dL    Comment: Glucose reference range applies only to samples taken after fasting for at least 8 hours.  SARS Coronavirus 2 by RT PCR (hospital order, performed in [facility] hospital lab) *cepheid single result test* Anterior Nasal Swab     Status: None   Collection Time: 06/30/23  1:33 PM   Specimen: Anterior Nasal Swab  Result Value Ref Range   SARS Coronavirus 2 by RT PCR NEGATIVE NEGATIVE    Comment: (NOTE) SARS-CoV-2 target nucleic acids are NOT DETECTED.  The SARS-CoV-2 RNA is generally detectable in upper and lower respiratory specimens during the acute phase of infection. The lowest concentration of SARS-CoV-2 viral copies this assay can detect is 250 copies / mL. A negative result does not preclude SARS-CoV-2 infection and should not be used as the sole basis for treatment or other patient management decisions.  A negative result may occur with improper specimen collection / handling, submission of specimen other than nasopharyngeal swab, presence of viral mutation(s) within the areas targeted by this assay, and inadequate number of viral copies (<250 copies / mL). A negative result must be combined with clinical observations, patient history, and epidemiological information.  Fact Sheet for Patients:   RoadLapTop.co.za  Fact Sheet for Healthcare Providers: http://kim-miller.com/  This test is not yet approved or  cleared by the [place] FDA and has been authorized for detection and/or diagnosis of SARS-CoV-2 by FDA under an Emergency Use Authorization (EUA).  This EUA will  remain in effect (meaning this test can be used) for the duration of the COVID-19 declaration under Section 564(b)(1) of the Act, 21 U.S.C. section 360bbb-3(b)(1), unless the authorization is terminated or revoked sooner.  Performed at [facility], [address] [place], [place] [address]   Troponin I (High Sensitivity)     Status: Abnormal   Collection Time: 06/30/23  1:33 PM  Result Value Ref Range   Troponin I (High Sensitivity) 35 (H) <18 ng/L    Comment: (NOTE) Elevated high sensitivity troponin I (hsTnI) values and significant  changes across serial measurements may suggest ACS but many other  chronic and acute conditions are known to elevate hsTnI results.  Refer to the "Links" section for chest pain algorithms and additional  guidance. Performed at [facility], [address] [place], [place] [address]   CBG monitoring, ED     Status: Abnormal   Collection Time: 06/30/23  3:11 PM  Result Value Ref Range   Glucose-Capillary 307 (H) 70 - 99 mg/dL    Comment: Glucose reference range applies only to samples taken after fasting for at least 8 hours.  CBG monitoring, ED     Status: Abnormal   Collection Time: 06/30/23  5:29 PM  Result Value Ref Range   Glucose-Capillary 309 (H) 70 - 99 mg/dL    Comment: Glucose reference range applies only to samples taken after fasting for at least 8 hours.  Basic metabolic panel     Status: Abnormal   Collection Time: 06/30/23  7:36 PM  Result Value Ref Range   Sodium 136 135 - 145 mmol/L   Potassium 3.7 3.5 - 5.1 mmol/L   Chloride 99 98 - 111 mmol/L   CO2 24 22 - 32 mmol/L   Glucose, Bld 201 (H) 70 - 99 mg/dL    Comment: Glucose reference range applies only to samples taken after fasting for at least 8 hours.   BUN 21 8 - 23 mg/dL  Creatinine, Ser 0.83 0.61 - 1.24 mg/dL   Calcium 8.9 8.9 - 16.1 mg/dL   GFR, Estimated >09 >60 mL/min    Comment: (NOTE) Calculated using the CKD-EPI Creatinine Equation (2021)     Anion gap 13 5 - 15    Comment: Performed at [facility], [address] [place], [place] [address]  Beta-hydroxybutyric acid     Status: Abnormal   Collection Time: 06/30/23  7:36 PM  Result Value Ref Range   Beta-Hydroxybutyric Acid 0.50 (H) 0.05 - 0.27 mmol/L    Comment: Performed at [facility], [address] [place], [place] [address]  CBG monitoring, ED     Status: Abnormal   Collection Time: 06/30/23  7:45 PM  Result Value Ref Range   Glucose-Capillary 215 (H) 70 - 99 mg/dL    Comment: Glucose reference range applies only to samples taken after fasting for at least 8 hours.   MR BRAIN WO CONTRAST  Result Date: 06/30/2023 CLINICAL DATA:  Neuro deficit, acute, stroke suspected. EXAM: MRI HEAD WITHOUT CONTRAST TECHNIQUE: Multiplanar, multiecho pulse sequences of the brain and surrounding structures were obtained without intravenous contrast. COMPARISON:  Head CT earlier same day.  MRI 04/20/2013. FINDINGS: Brain: Diffusion imaging does not show any acute or subacute infarction or other cause of restricted diffusion. No abnormality affects the brainstem or cerebellum. Cerebral hemispheres show age related volume loss without subjective lobar predominance. Mild gliosis seen in the deep white matter adjacent to the atrium of the right lateral ventricle could relate to small vessel disease or demyelinating disease. 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 No large vessel territory stroke. No mass, hemorrhage, hydrocephalus or extra-axial collection. Susceptibility artifact of unknown nature at the vertex on the left, possibly related to a tiny foreign object in the scalp. I cannot identify this on the CT scan. Vascular: Major vessels at the base of the brain show flow. Skull and upper cervical spine: Negative Sinuses/Orbits: Clear/normal Other: None IMPRESSION: No acute finding by MRI. Age related volume loss. Mild gliosis in the deep white matter  adjacent to the atrium of the right lateral ventricle could relate to small vessel disease or demyelinating disease. 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 Electronically Signed   By: Paulina Fusi M.D.   On: 06/30/2023 18:06   CT HEAD WO CONTRAST ( )  Result Date: 06/30/2023 CLINICAL DATA:  Neuro deficit, acute, stroke suspected. Vertigo times a few days with multiple falls. EXAM: CT HEAD WITHOUT CONTRAST TECHNIQUE: Contiguous axial images were obtained from the base of the skull through the vertex without intravenous contrast. RADIATION DOSE REDUCTION: This exam was performed according to the departmental dose-optimization program which includes automated exposure control, adjustment of the mA and/or kV according to patient size and/or use of iterative reconstruction technique. COMPARISON:  Head CT 05/03/2022. FINDINGS: Brain: No acute hemorrhage. Unchanged mild chronic small-vessel disease. Cortical gray-white differentiation is otherwise preserved. Prominence of the ventricles and sulci within expected range for age. No hydrocephalus or extra-axial collection. No mass effect or midline shift. Vascular: No hyperdense vessel or unexpected calcification. Skull: No calvarial fracture or suspicious bone lesion. Skull base is unremarkable. Sinuses/Orbits: No acute finding. Other: None. IMPRESSION: 1. No acute intracranial hemorrhage or evidence of acute large vessel territory infarction. 2. Unchanged mild chronic small-vessel disease. Electronically Signed   By: Orvan Falconer M.D.   On: 06/30/2023 15:53   DG Chest 2 View  Result Date: 06/30/2023  CLINICAL DATA:  Dizziness. EXAM: CHEST - 2 VIEW COMPARISON:  Chest radiograph 05/07/2007. FINDINGS: Low lung volumes accentuate the pulmonary vasculature and cardiomediastinal silhouette. No consolidation or pulmonary edema. No pleural effusion or pneumothorax. IMPRESSION: Low lung volumes without evidence of acute cardiopulmonary  disease. Electronically Signed   By: Orvan Falconer M.D.   On: 06/30/2023 13:56    Pending Labs Unresulted Labs (From admission, onward)     Start     Ordered   07/01/23 0500  HIV Antibody (routine testing w rflx)  (HIV Antibody (Routine testing w reflex) panel)  Tomorrow morning,   R        06/30/23 1638   07/01/23 0500  CBC with Differential/Platelet  Tomorrow morning,   R        06/30/23 1639   07/01/23 0500  Basic metabolic panel  Tomorrow morning,   R        06/30/23 1639            Vitals/Pain Today's Vitals   06/30/23 1719 06/30/23 2045 06/30/23 2123 06/30/23 2130  BP:  (!) 143/84    Pulse:  (!) 112  (!) 111  Resp:  20    Temp: 98.3 F (36.8 C)  98.1 F (36.7 C)   SpO2:  93%  95%  Weight:      Height:        Isolation Precautions No active isolations  Medications Medications  insulin aspart (novoLOG) injection 0-15 Units (5 Units Subcutaneous Given 06/30/23 1945)  lactated ringers infusion ( Intravenous New Bag/Given 06/30/23 1730)  enoxaparin (LOVENOX) injection 40 mg (40 mg Subcutaneous Given 06/30/23 2135)  acetaminophen (TYLENOL) tablet 650 mg (has no administration in time range)  ondansetron (ZOFRAN) injection 4 mg (has no administration in time range)  traZODone (DESYREL) tablet 50 mg (has no administration in time range)  rOPINIRole (REQUIP) tablet 2 mg (2 mg Oral Given 06/30/23 2133)  QUEtiapine (SEROQUEL) tablet 300 mg (300 mg Oral Given 06/30/23 2134)  propranolol ER (INDERAL LA) 24 hr capsule 60 mg (60 mg Oral Given 06/30/23 2134)  LORazepam (ATIVAN) tablet 1 mg (1 mg Oral Given 06/30/23 2134)  lamoTRIgine (LAMICTAL) tablet 200 mg (has no administration in time range)  DULoxetine (CYMBALTA) DR capsule 60 mg (has no administration in time range)  lactated ringers bolus 1,000 mL (0 mLs Intravenous Stopped 06/30/23 1717)  insulin aspart (novoLOG) injection 8 Units (8 Units Intravenous Given 06/30/23 1331)  meclizine (ANTIVERT) tablet 25 mg (25 mg Oral Given  06/30/23 1541)    Mobility walks with person assist     Focused Assessments Cardiac Assessment Handoff:  Cardiac Rhythm: Sinus tachycardia Lab Results  Component Value Date   CKTOTAL 48 05/15/2021   TROPONINI <0.03 03/15/2016   No results found for: "DDIMER" Does the Patient currently have chest pain? No    R Recommendations: See Admitting Provider Note  Report given to:   Additional Notes:

## 2023-06-30 NOTE — Assessment & Plan Note (Addendum)
Patient is presenting with several day history of vertigo, with markedly elevated CBG.  Urinalysis shows ketonuria, however bicarb is only on the lower end of normal and anion gap is 12.  Beta hydroxybutyrate acid is elevated though consistent with a mild DKA.   - Admit to progressive unit - Telemetry monitoring - SSI every 4 - N.p.o. - IV fluids as ordered - Repeat BMP and BHA in 8 hours

## 2023-06-30 NOTE — Assessment & Plan Note (Signed)
Minimally elevated troponin in the absence of chest pain likely due to DKA.  No further intervention at this time.

## 2023-06-30 NOTE — Assessment & Plan Note (Signed)
-   Resume home antihypertensives pending MRI results

## 2023-06-30 NOTE — Assessment & Plan Note (Addendum)
Patient is presenting with dizziness that is most consistent with benign positional vertigo given symptoms only occur with movement of the head or body, however he has multiple risk factors for CVA. Differential also includes symptomatic hyperglycemia.  - MRI pending - Will consult neurology if MRI is abnormal - Management of DKA as noted above

## 2023-06-30 NOTE — ED Provider Notes (Signed)
[facility] Provider Note    Event Date/Time   First MD Initiated Contact with Patient 06/30/23 1253     (approximate)   History   Dizziness (Patient states he has been having "really bad vertigo" the last few days and has had multiple falls; CBG was 529 with EMS and patient received NaCl 500 ml; Currently takes Metformin for DM, GCS 15 upon arrival)   HPI  Bryan Flowers is a 62 y.o. male  with h/o DM, vertigo, here with dizziness, vertigo, multiple falls. Pt has fallen 3x in last 48 hours. Reports that he has been having intermittent, but persistent, episodes of severe dizziness the last several days, described as room spinning. He has felt off balance and has had 3 falls, from losing his balance. He has also had general fatigue, polyuria, and weakness today to the point where he had difficulty ambulating. No fevers, chills. No recent med changes.        Physical Exam   Triage Vital Signs: ED Triage Vitals  Encounter Vitals Group     BP 06/30/23 1153 (!) 131/95     Systolic BP Percentile --      Diastolic BP Percentile --      Pulse Rate 06/30/23 1153 (!) 108     Resp 06/30/23 1153 19     Temp 06/30/23 1231 98 F (36.7 C)     Temp src --      SpO2 06/30/23 1153 92 %     Weight 06/30/23 1156 179 lb (81.2 kg)     Height 06/30/23 1156 5\' 11"  (1.803 m)     Head Circumference --      Peak Flow --      Pain Score --      Pain Loc --      Pain Education --      Exclude from Growth Chart --     Most recent vital signs: Vitals:   06/30/23 1630 06/30/23 1719  BP: (!) 144/90   Pulse: 99   Resp: (!) 22   Temp:  98.3 F (36.8 C)  SpO2: 97%      General: Awake, no distress.  CV:  Good peripheral perfusion. Tachycardic. Resp:  Normal work of breathing. Lungs clear. Abd:  No distention. No tenderness. Other:  Mild rightward beating horizontal nystagmus, EOM otherwise intact. No overt dysmetria. Face is symmetric. Speech normal. Strength 5/5 bl  UE and LE. Normal sensation to light touch bl UE and LE.   ED Results / Procedures / Treatments   Labs (all labs ordered are listed, but only abnormal results are displayed) Labs Reviewed  BASIC METABOLIC PANEL - Abnormal; Notable for the following components:      Result Value   Sodium 132 (*)    Glucose, Bld 506 (*)    BUN 31 (*)    Calcium 8.8 (*)    All other components within normal limits  BETA-HYDROXYBUTYRIC ACID - Abnormal; Notable for the following components:   Beta-Hydroxybutyric Acid 1.87 (*)    All other components within normal limits  URINALYSIS, ROUTINE W REFLEX MICROSCOPIC - Abnormal; Notable for the following components:   Color, Urine STRAW (*)    APPearance CLEAR (*)    Glucose, UA >=500 (*)    Hgb urine dipstick SMALL (*)    Ketones, ur 5 (*)    All other components within normal limits  CBG MONITORING, ED - Abnormal; Notable for the following components:   Glucose-Capillary  481 (*)    All other components within normal limits  CBG MONITORING, ED - Abnormal; Notable for the following components:   Glucose-Capillary 307 (*)    All other components within normal limits  CBG MONITORING, ED - Abnormal; Notable for the following components:   Glucose-Capillary 309 (*)    All other components within normal limits  TROPONIN I (HIGH SENSITIVITY) - Abnormal; Notable for the following components:   Troponin I (High Sensitivity) 44 (*)    All other components within normal limits  TROPONIN I (HIGH SENSITIVITY) - Abnormal; Notable for the following components:   Troponin I (High Sensitivity) 35 (*)    All other components within normal limits  SARS CORONAVIRUS 2 BY RT PCR  CBC  HEMOGLOBIN A1C  BASIC METABOLIC PANEL  BETA-HYDROXYBUTYRIC ACID  HIV ANTIBODY (ROUTINE TESTING W REFLEX)  CBC WITH DIFFERENTIAL/PLATELET  BASIC METABOLIC PANEL     EKG Sinus tachycardia, VR 110. PR 150, QRS 92, QTc 476. No acute ST elevations. QT prolonged, TWI in lateral  leads.   RADIOLOGY CXR: Clear CT Head; NAICA on my prelim read  I also independently reviewed and agree with radiologist interpretations.   PROCEDURES:  Critical Care performed: No  .1-3 Lead EKG Interpretation  Performed by: Shaune Pollack, MD Authorized by: Shaune Pollack, MD     Interpretation: non-specific     ECG rate:  90-110   ECG rate assessment: tachycardic     Rhythm: sinus tachycardia     Ectopy: none     Conduction: normal   Comments:     Indication: Weakness     MEDICATIONS ORDERED IN ED: Medications  insulin aspart (novoLOG) injection 0-15 Units (11 Units Subcutaneous Given 06/30/23 1729)  lactated ringers infusion ( Intravenous New Bag/Given 06/30/23 1730)  enoxaparin (LOVENOX) injection 40 mg (has no administration in time range)  acetaminophen (TYLENOL) tablet 650 mg (has no administration in time range)  ondansetron (ZOFRAN) injection 4 mg (has no administration in time range)  traZODone (DESYREL) tablet 50 mg (has no administration in time range)  rOPINIRole (REQUIP) tablet 2 mg (has no administration in time range)  QUEtiapine (SEROQUEL) tablet 300 mg (has no administration in time range)  propranolol ER (INDERAL LA) 24 hr capsule 60 mg (has no administration in time range)  LORazepam (ATIVAN) tablet 1 mg (has no administration in time range)  lamoTRIgine (LAMICTAL) tablet 200 mg (has no administration in time range)  DULoxetine (CYMBALTA) DR capsule 60 mg (has no administration in time range)  lactated ringers bolus 1,000 mL (0 mLs Intravenous Stopped 06/30/23 1717)  insulin aspart (novoLOG) injection 8 Units (8 Units Intravenous Given 06/30/23 1331)  meclizine (ANTIVERT) tablet 25 mg (25 mg Oral Given 06/30/23 1541)     IMPRESSION / MDM / ASSESSMENT AND PLAN / ED COURSE  I reviewed the triage vital signs and the nursing notes.                              Differential diagnosis includes, but is not limited to, peripheral vertigo, complicated  now by dehydration, AKI, DKA, occult infection (UTI, PNA), CVA, intracranial mass/lesion  Patient's presentation is most consistent with acute presentation with potential threat to life or bodily function.  The patient is on the cardiac monitor to evaluate for evidence of arrhythmia and/or significant heart rate changes  63 yo M with PMHx DM,  HTN, HLD here with generalized weakness, vertigo, hyperglycemia. Suspect peripheral vertigo  with h/o same, though pt has multiple CVA risk factors. CT head shows NAICA. He has no other focal neuro deficits on exam. Will check MR given his history, risk factors. Otherwise, pt noted to be very hyperglycemic witb borderline DKA, dehydration, and mild demand-related ischemia. Glu 506, BHB 1.87. Will give IVF, insulin, admit.   FINAL CLINICAL IMPRESSION(S) / ED DIAGNOSES   Final diagnoses:  Vertigo  Hyperglycemia  Generalized weakness     Rx / DC Orders   ED Discharge Orders     None        Note:  This document was prepared using Dragon voice recognition software and may include unintentional dictation errors.   Shaune Pollack, MD 06/30/23 [phone]

## 2023-06-30 NOTE — ED Triage Notes (Signed)
Patient states he has been having "really bad vertigo" the last few days and has had multiple falls; CBG was 529 with EMS and patient received NaCl 500 ml; Currently takes Metformin for DM, GCS 15 upon arrival

## 2023-06-30 NOTE — Assessment & Plan Note (Signed)
-   Continue home regimen 

## 2023-07-01 ENCOUNTER — Other Ambulatory Visit: Payer: Self-pay

## 2023-07-01 ENCOUNTER — Other Ambulatory Visit (HOSPITAL_COMMUNITY): Payer: Self-pay

## 2023-07-01 ENCOUNTER — Encounter (HOSPITAL_COMMUNITY): Payer: Self-pay | Admitting: Hematology

## 2023-07-01 DIAGNOSIS — G20A1 Parkinson's disease without dyskinesia, without mention of fluctuations: Secondary | ICD-10-CM | POA: Diagnosis not present

## 2023-07-01 DIAGNOSIS — I2489 Other forms of acute ischemic heart disease: Secondary | ICD-10-CM | POA: Diagnosis not present

## 2023-07-01 DIAGNOSIS — E1165 Type 2 diabetes mellitus with hyperglycemia: Secondary | ICD-10-CM | POA: Diagnosis not present

## 2023-07-01 DIAGNOSIS — E1110 Type 2 diabetes mellitus with ketoacidosis without coma: Secondary | ICD-10-CM | POA: Diagnosis not present

## 2023-07-01 LAB — GLUCOSE, CAPILLARY
Glucose-Capillary: 146 mg/dL — ABNORMAL HIGH (ref 70–99)
Glucose-Capillary: 174 mg/dL — ABNORMAL HIGH (ref 70–99)
Glucose-Capillary: 188 mg/dL — ABNORMAL HIGH (ref 70–99)
Glucose-Capillary: 303 mg/dL — ABNORMAL HIGH (ref 70–99)
Glucose-Capillary: 292 mg/dL — ABNORMAL HIGH (ref 70–99)

## 2023-07-01 LAB — BASIC METABOLIC PANEL
Anion gap: 9 (ref 5–15)
BUN: 18 mg/dL (ref 8–23)
CO2: 25 mmol/L (ref 22–32)
Calcium: 8.6 mg/dL — ABNORMAL LOW (ref 8.9–10.3)
Chloride: 103 mmol/L (ref 98–111)
Creatinine, Ser: 0.7 mg/dL (ref 0.61–1.24)
GFR, Estimated: >60 mL/min (ref >60)
Glucose, Bld: 147 mg/dL — ABNORMAL HIGH (ref 70–99)
Potassium: 3.6 mmol/L (ref 3.5–5.1)
Sodium: 137 mmol/L (ref 135–145)

## 2023-07-01 LAB — HIV ANTIBODY (ROUTINE TESTING W REFLEX): HIV Screen 4th Generation wRfx: NONREACTIVE

## 2023-07-01 LAB — CBC WITH DIFFERENTIAL/PLATELET
Abs Immature Granulocytes: 0.04 10*3/uL (ref 0.00–0.07)
Basophils Absolute: 0.1 10*3/uL (ref 0.0–0.1)
Basophils Relative: 1 %
Eosinophils Absolute: 0.1 10*3/uL (ref 0.0–0.5)
Eosinophils Relative: 1 %
HCT: 38.4 % — ABNORMAL LOW (ref 39.0–52.0)
Hemoglobin: 13 g/dL (ref 13.0–17.0)
Immature Granulocytes: 1 %
Lymphocytes Relative: 22 %
Lymphs Abs: 1.6 10*3/uL (ref 0.7–4.0)
MCH: 29 pg (ref 26.0–34.0)
MCHC: 33.9 g/dL (ref 30.0–36.0)
MCV: 85.7 fL (ref 80.0–100.0)
Monocytes Absolute: 0.8 10*3/uL (ref 0.1–1.0)
Monocytes Relative: 10 %
Neutro Abs: 4.9 10*3/uL (ref 1.7–7.7)
Neutrophils Relative %: 65 %
Platelets: 285 10*3/uL (ref 150–400)
RBC: 4.48 MIL/uL (ref 4.22–5.81)
RDW: 13.4 % (ref 11.5–15.5)
WBC: 7.4 10*3/uL (ref 4.0–10.5)
nRBC: 0 % (ref 0.0–0.2)

## 2023-07-01 MED ORDER — GLUCERNA SHAKE PO LIQD
237.0000 mL | Freq: Three times a day (TID) | ORAL | Status: DC
  Administered 2023-07-01 – 2023-07-02 (×3): 237 mL via ORAL

## 2023-07-01 MED ORDER — ORAL CARE MOUTH RINSE: 15.0000 mL | OROMUCOSAL | Status: DC | PRN

## 2023-07-01 MED ORDER — SODIUM CHLORIDE 0.9 % IV SOLN: INTRAVENOUS | Status: DC

## 2023-07-01 MED ORDER — AMLODIPINE BESYLATE 5 MG PO TABS
5.0000 mg | ORAL_TABLET | Freq: Every day | ORAL | Status: DC
  Administered 2023-07-01 – 2023-07-02 (×2): 5 mg via ORAL
  Filled 2023-07-01 (×2): qty 1

## 2023-07-01 MED ORDER — INFLUENZA VIRUS VACC SPLIT PF (FLUZONE) 0.5 ML IM SUSY
0.5000 mL | PREFILLED_SYRINGE | INTRAMUSCULAR | Status: AC
  Administered 2023-07-02: 0.5 mL via INTRAMUSCULAR
  Filled 2023-07-01: qty 0.5

## 2023-07-01 MED ORDER — ADULT MULTIVITAMIN W/MINERALS CH
1.0000 | ORAL_TABLET | Freq: Every day | ORAL | Status: DC
  Administered 2023-07-01 – 2023-07-02 (×2): 1 via ORAL
  Filled 2023-07-01 (×2): qty 1

## 2023-07-01 NOTE — Progress Notes (Signed)
Initial Nutrition Assessment  DOCUMENTATION CODES:   Not applicable  INTERVENTION:   -Continue carb modified diet  -MVI with minerals daily -Glucerna Shake po TID, each supplement provides 220 kcal and 10 grams of protein  -Provided education on carb modified diet and diabetes self-management. Provided "Plate Method" and "Carbohydrate Counting for People with Diabetes" handout from [facility] Nutrition Care Manual; attached to AVS/ discharge summary -RD referred pt to [facility] for further support and reinforcement  NUTRITION DIAGNOSIS:   Increased nutrient needs related to chronic illness (Parkinson's) as evidenced by estimated needs.  GOAL:   Patient will meet greater than or equal to 90% of their needs  MONITOR:   PO intake, Supplement acceptance  REASON FOR ASSESSMENT:   Malnutrition Screening Tool    ASSESSMENT:   Pt with medical history significant of type 2 diabetes, Parkinson's disease, bipolar 1 disorder, hypertension, hyperlipidemia, peptic ulcer disease, OSA on CPAP, who presents due to dizziness.  Pt admitted with DKA and vertigo.   Reviewed I/O's: +2.6 L x 24 hours  Per MD notes, MRI is pending. Plan to consult neurology if abnormal.   Spoke with pt and family members at bedside. Pt reports feeling better and slept well last night. Pt reports he has ahad decreased appetite over the past 5 years due to his wife passing away. He generally eats 3 meals per day (Breakfast: frozen breakfast sandwich; Lunch: sandwich; hot dog or hamburger with vegetable side).  Pt mainly drinks water, chocolate milk, and diet soda.   Noted distant history of weight loss. Per pt brother, pt has lost about 80# wt loss over the past year, which he contributes to poorly managed DM. However, no wt history available to assess this.   Per pt, he checks his CBGS 3-4 times per week and usually runs between 200-250. Pt takes only metformin; reports  that he had been prescribed insulin in the past, however, this was discontinued a few months ago due to improved glycemic control. His PCP at [facility].   Discussed importance of good meal and supplement intake and importance of compliance with DM self-management to optimize glycemic control. Per chart review, pt is not adherent with DM regimen. Pt amenable to supplements.   Medications reviewed and include ativan.   Lab Results  Component Value Date   HGBA1C 13.9 (H) 06/30/2023   PTA DM medications are 6 units insulin aspart TID, 15 units insulin glargine-yfgn daily, and 1000 mg metformin BID.   Labs reviewed: CBGS: 146-309 (inpatient orders for glycemic control are 0-15 units insulin aspart every 4 hours).     NUTRITION - FOCUSED PHYSICAL EXAM:  Flowsheet Row Most Recent Value  Orbital Region No depletion  Upper Arm Region Mild depletion  Thoracic and Lumbar Region No depletion  Buccal Region No depletion  Temple Region No depletion  Clavicle Bone Region Mild depletion  Clavicle and Acromion Bone Region Mild depletion  Scapular Bone Region Mild depletion  Dorsal Hand Mild depletion  Patellar Region Moderate depletion  Anterior Thigh Region Moderate depletion  Posterior Calf Region Moderate depletion  Edema (RD Assessment) None  Hair Reviewed  Eyes Reviewed  Mouth Reviewed  Skin Reviewed  Nails Reviewed       Diet Order:   Diet Order             Diet Carb Modified Fluid consistency: Thin; Room service appropriate? Yes  Diet effective now  EDUCATION NEEDS:   Education needs have been addressed  Skin:  Skin Assessment: Reviewed RN Assessment  Last BM:  06/29/23  Height:   Ht Readings from Last 1 Encounters:  06/30/23 5\' 11"  (1.803 m)    Weight:   Wt Readings from Last 1 Encounters:  06/30/23 81.2 kg    Ideal Body Weight:  78.2 kg  BMI:  Body mass index is 24.97 kg/m.  Estimated Nutritional Needs:   Kcal:   2050-2250  Protein:  105-120 grams  Fluid:  > 2 L    Levada Schilling, RD, LDN, CDCES Registered Dietitian II Certified Diabetes Care and Education Specialist Please refer to [facility] for RD and/or RD on-call/weekend/after hours pager

## 2023-07-01 NOTE — Progress Notes (Signed)
   07/01/23 1000  Spiritual Encounters  Type of Visit Initial  Care provided to: Pt and family  Referral source Chaplain assessment  Reason for visit Advance directives  OnCall Visit No   Chaplain received a spiritual consult for an advance directive. Chaplain went to visit patient and his family. The patient said he would like to wait on filling out the paperwork and that he will have the nurses notify the chaplain when he is ready. Chaplain services remain available upon request.

## 2023-07-01 NOTE — Evaluation (Signed)
Occupational Therapy Evaluation Patient Details Name: Bryan Flowers MRN: [id number] DOB: [birth year] Today's Date: 07/01/2023   History of Present Illness 62 y.o. male with medical history significant of type 2 diabetes, Parkinson's disease, bipolar 1 disorder, hypertension, hyperlipidemia, peptic ulcer disease, OSA on CPAP, who presents to the ED due to dizziness.   Clinical Impression   Pt was seen for OT evaluation this date and co-tx to optimize safety for initial mobility efforts. Prior to hospital admission, pt was living alone in an apartment, indep with mobility and ADL. He does not drive and his family is able to assist with transportation. He also has groceries delivered sometimes. Pt endorses 3 falls in the last 3 days. He cites tripping over his feet as part of the problem and the dizziness. Pt notes occasional issues with mild dizziness when getting up first thing in the morning or when standing from a chair. Pt presents to acute OT demonstrating impaired ADL performance and functional mobility 2/2 impaired balance, BLE strength and coordination (See OT problem list for additional functional deficits). Pt currently requires CGA for ADL transfers and mobility with mild unsteadiness noted with mobility and tendency to not pick his feet up. Pt educated in home/routines modifications and strategies to minimize risk of becoming dizzy with positional changes. Pt would benefit from skilled OT services to address noted impairments and functional limitations (see below for any additional details) in order to maximize safety and independence while minimizing falls risk and caregiver burden.     If plan is discharge home, recommend the following: A little help with walking and/or transfers;A little help with bathing/dressing/bathroom;Assistance with cooking/housework;Assist for transportation;Help with stairs or ramp for entrance    Functional Status Assessment  Patient has had a recent decline  in their functional status and demonstrates the ability to make significant improvements in function in a reasonable and predictable amount of time.  Equipment Recommendations  Tub/shower bench    Recommendations for Other Services       Precautions / Restrictions Precautions Precautions: Fall Restrictions Weight Bearing Restrictions: No      Mobility Bed Mobility Overal bed mobility: Needs Assistance Bed Mobility: Supine to Sit     Supine to sit: Supervision     General bed mobility comments: pt endorses brief dizziness wiht sitting that resolves quickly    Transfers Overall transfer level: Needs assistance Equipment used: 2 person hand held assist Transfers: Sit to/from Stand Sit to Stand: Contact guard assist           General transfer comment: VC for hand placement      Balance Overall balance assessment: Needs assistance Sitting-balance support: Feet supported, Single extremity supported, No upper extremity supported Sitting balance-Leahy Scale: Fair     Standing balance support: No upper extremity supported Standing balance-Leahy Scale: Fair Standing balance comment: balance improving with time                           ADL either performed or assessed with clinical judgement   ADL                                         General ADL Comments: Pt able to don socks seated EOB wiht supv, CGA for STS for ADL, anticipate CGA for toilet transfer     Vision  Perception         Praxis         Pertinent Vitals/Pain Pain Assessment Pain Assessment:  (pt denies pain other than chronic pains)     Extremity/Trunk Assessment Upper Extremity Assessment Upper Extremity Assessment: Overall WFL for tasks assessed   Lower Extremity Assessment Lower Extremity Assessment: Defer to PT evaluation       Communication Communication Communication: No apparent difficulties   Cognition Arousal: Alert Behavior During  Therapy: WFL for tasks assessed/performed Overall Cognitive Status: Within Functional Limits for tasks assessed                                       General Comments       Exercises Other Exercises Other Exercises: Pt educated in slowing down and brief breaks during positional changes to minimize risk of dizziness and help minimize risk of falls   Shoulder Instructions      Home Living Family/patient expects to be discharged to:: Private residence Living Arrangements: Alone Available Help at Discharge: Family;Available PRN/intermittently Type of Home: Apartment Home Access: Level entry     Home Layout: One level     Bathroom Shower/Tub: Chief Strategy Officer: Standard     Home Equipment: Rollator (4 wheels)          Prior Functioning/Environment Prior Level of Function : History of Falls (last six months);Independent/Modified Independent             Mobility Comments: indep, multiple recent falls ADLs Comments: indep, does not drive, family takes to stores or pt has groceries delivered        OT Problem List: Decreased strength;Decreased coordination;Impaired balance (sitting and/or standing);Decreased knowledge of use of DME or AE      OT Treatment/Interventions: Self-care/ADL training;Therapeutic exercise;Therapeutic activities;Neuromuscular education;Patient/family education;DME and/or AE instruction;Balance training    OT Goals(Current goals can be found in the care plan section) Acute Rehab OT Goals Patient Stated Goal: go home OT Goal Formulation: With patient Time For Goal Achievement: 07/15/23 Potential to Achieve Goals: Good ADL Goals Pt Will Perform Lower Body Dressing: with modified independence;sit to/from stand Pt Will Transfer to Toilet: with modified independence;ambulating (LRAD) Pt Will Perform Toileting - Clothing Manipulation and hygiene: with modified independence Additional ADL Goal #1: Pt will verbalize  plan to implement at least 2 learned falls prevention strategies.  OT Frequency: Min 1X/week    Co-evaluation PT/OT/SLP Co-Evaluation/Treatment: Yes Reason for Co-Treatment: To address functional/ADL transfers PT goals addressed during session: Mobility/safety with mobility OT goals addressed during session: ADL's and self-care      AM-PAC OT "6 Clicks" Daily Activity     Outcome Measure Help from another person eating meals?: None Help from another person taking care of personal grooming?: None Help from another person toileting, which includes using toliet, bedpan, or urinal?: A Little Help from another person bathing (including washing, rinsing, drying)?: A Little Help from another person to put on and taking off regular upper body clothing?: None Help from another person to put on and taking off regular lower body clothing?: A Little 6 Click Score: 21   End of Session    Activity Tolerance: Patient tolerated treatment well Patient left: in chair;with call bell/phone within reach;with chair alarm set  OT Visit Diagnosis: Other abnormalities of gait and mobility (R26.89);Muscle weakness (generalized) (M62.81)  Time: 1610-9604 OT Time Calculation (min): 19 min Charges:  OT General Charges $OT Visit: 1 Visit OT Evaluation $OT Eval Low Complexity: 1 Low  Arman Filter., MPH, MS, OTR/L ascom [phone] 07/01/23, 3:39 PM

## 2023-07-01 NOTE — TOC Benefit Eligibility Note (Signed)
Pharmacy Patient Advocate Encounter  Insurance verification completed.    The patient is insured through [employer] test claim for [employer]. Currently a quantity of 6mL is a 30 day supply and the co-pay is $0.00 .  Ran test claim for [employer] Currently a quantity of 4.50mL is a 30 day supply and the co-pay is $0.00 .  Ran test claim for [place]. Currently a quantity of 15mL is a 30 day supply and the co-pay is $0.00 .  This test claim was processed through [facility]- copay amounts may vary at other pharmacies due to pharmacy/plan contracts, or as the patient moves through the different stages of their insurance plan.

## 2023-07-01 NOTE — Discharge Instructions (Addendum)
Plate Method for Diabetes   Foods with carbohydrates make your blood glucose level go up. The plate method is a simple way to meal plan and control the amount of carbohydrate you eat.         Use the following guidance to build a healthy plate to control carbohydrates. Divide a 9-inch plate into 3 sections, and consider your beverage the 4th section of your meal: Food Group Examples of Foods/Beverages for This Section of your Meal  Section 1: Non-starchy vegetables Fill  of your plate to include non-starchy vegetables Asparagus, broccoli, brussels sprouts, cabbage, carrots, cauliflower, celery, cucumber, green beans, mushrooms, peppers, salad greens, tomatoes, or zucchini.  Section 2: Protein foods Fill  of your plate to include a lean protein Lean meat, poultry, fish, seafood, cheese, eggs, lean deli meat, tofu, beans, lentils, nuts or nut butters.  Section 3: Carbohydrate foods Fill  of your plate to include carbohydrate foods Whole grains, whole wheat bread, brown rice, whole grain pasta, polenta, corn tortillas, fruit, or starchy vegetables (potatoes, green peas, corn, beans, acorn squash, and butternut squash). One cup of milk also counts as a food that contains carbohydrate.  Section 4: Beverage Choose water or a low-calorie drink for your beverage. Unsweetened tea, coffee, or flavored/sparkling water without added sugar.  Image reprinted with permission from The American Diabetes Association.  Copyright 2022 by the American Diabetes Association.   Copyright 2022  Academy of Nutrition and Dietetics. All rights reserved    Carbohydrate Counting For People With Diabetes  Foods with carbohydrates make your blood glucose level go up. Learning how to count carbohydrates can help you control your blood glucose levels. First, identify the foods you eat that contain carbohydrates. Then, using the Foods with Carbohydrates chart, determine about how much carbohydrates are in your meals and  snacks. Make sure you are eating foods with fiber, protein, and healthy fat along with your carbohydrate foods. Foods with Carbohydrates The following table shows carbohydrate foods that have about 15 grams of carbohydrate each. Using measuring cups, spoons, or a food scale when you first begin learning about carbohydrate counting can help you learn about the portion sizes you typically eat. The following foods have 15 grams carbohydrate each:  Grains 1 slice bread (1 ounce)  1 small tortilla (6-inch size)   large bagel (1 ounce)  1/3 cup pasta or rice (cooked)   hamburger or hot dog bun ( ounce)   cup cooked cereal   to  cup ready-to-eat cereal  2 taco shells (5-inch size) Fruit 1 small fresh fruit ( to 1 cup)   medium banana  17 small grapes (3 ounces)  1 cup melon or berries   cup canned or frozen fruit  2 tablespoons dried fruit (blueberries, cherries, cranberries, raisins)   cup unsweetened fruit juice  Starchy Vegetables  cup cooked beans, peas, corn, potatoes/sweet potatoes   large baked potato (3 ounces)  1 cup acorn or butternut squash  Snack Foods 3 to 6 crackers  8 potato chips or 13 tortilla chips ( ounce to 1 ounce)  3 cups popped popcorn  Dairy 3/4 cup (6 ounces) nonfat plain yogurt, or yogurt with sugar-free sweetener  1 cup milk  1 cup plain rice, soy, coconut or flavored almond milk Sweets and Desserts  cup ice cream or frozen yogurt  1 tablespoon jam, jelly, pancake syrup, table sugar, or honey  2 tablespoons light pancake syrup  1 inch square of frosted cake or 2 inch square of  unfrosted cake  2 small cookies (2/3 ounce each) or  large cookie  Sometimes youll have to estimate carbohydrate amounts if you dont know the exact recipe. One cup of mixed foods like soups can have 1 to 2 carbohydrate servings, while some casseroles might have 2 or more servings of carbohydrate. Foods that have less than 20 calories in each serving can be counted as  free foods. Count 1 cup raw vegetables, or  cup cooked non-starchy vegetables as free foods. If you eat 3 or more servings at one meal, then count them as 1 carbohydrate serving.  Foods without Carbohydrates  Not all foods contain carbohydrates. Meat, some dairy, fats, non-starchy vegetables, and many beverages dont contain carbohydrate. So when you count carbohydrates, you can generally exclude chicken, pork, beef, fish, seafood, eggs, tofu, cheese, butter, sour cream, avocado, nuts, seeds, olives, mayonnaise, water, black coffee, unsweetened tea, and zero-calorie drinks. Vegetables with no or low carbohydrate include green beans, cauliflower, tomatoes, and onions. How much carbohydrate should I eat at each meal?  Carbohydrate counting can help you plan your meals and manage your weight. Following are some starting points for carbohydrate intake at each meal. Work with your registered dietitian nutritionist to find the best range that works for your blood glucose and weight.   To Lose Weight To Maintain Weight  Women 2 - 3 carb servings 3 - 4 carb servings  Men 3 - 4 carb servings 4 - 5 carb servings  Checking your blood glucose after meals will help you know if you need to adjust the timing, type, or number of carbohydrate servings in your meal plan. Achieve and keep a healthy body weight by balancing your food intake and physical activity.  Tips How should I plan my meals?  Plan for half the food on your plate to include non-starchy vegetables, like salad greens, broccoli, or carrots. Try to eat 3 to 5 servings of non-starchy vegetables every day. Have a protein food at each meal. Protein foods include chicken, fish, meat, eggs, or beans (note that beans contain carbohydrate). These two food groups (non-starchy vegetables and proteins) are low in carbohydrate. If you fill up your plate with these foods, you will eat less carbohydrate but still fill up your stomach. Try to limit your carbohydrate  portion to  of the plate.  What fats are healthiest to eat?  Diabetes increases risk for heart disease. To help protect your heart, eat more healthy fats, such as olive oil, nuts, and avocado. Eat less saturated fats like butter, cream, and high-fat meats, like bacon and sausage. Avoid trans fats, which are in all foods that list partially hydrogenated oil as an ingredient. What should I drink?  Choose drinks that are not sweetened with sugar. The healthiest choices are water, carbonated or seltzer waters, and tea and coffee without added sugars.  Sweet drinks will make your blood glucose go up very quickly. One serving of soda or energy drink is  cup. It is best to drink these beverages only if your blood glucose is low.  Artificially sweetened, or diet drinks, typically do not increase your blood glucose if they have zero calories in them. Read labels of beverages, as some diet drinks do have carbohydrate and will raise your blood glucose. Label Reading Tips Read Nutrition Facts labels to find out how many grams of carbohydrate are in a food you want to eat. Dont forget: sometimes serving sizes on the label arent the same as how much food  you are going to eat, so you may need to calculate how much carbohydrate is in the food you are serving yourself.   Carbohydrate Counting for People with Diabetes Sample 1-Day Menu  Breakfast  cup yogurt, low fat, low sugar (1 carbohydrate serving)   cup cereal, ready-to-eat, unsweetened (1 carbohydrate serving)  1 cup strawberries (1 carbohydrate serving)   cup almonds ( carbohydrate serving)  Lunch 1, 5 ounce can chunk light tuna  2 ounces cheese, low fat cheddar  6 whole wheat crackers (1 carbohydrate serving)  1 small apple (1 carbohydrate servings)   cup carrots ( carbohydrate serving)   cup snap peas  1 cup 1% milk (1 carbohydrate serving)   Evening Meal Stir fry made with: 3 ounces chicken  1 cup brown rice (3 carbohydrate servings)    cup broccoli ( carbohydrate serving)   cup green beans   cup onions  1 tablespoon olive oil  2 tablespoons teriyaki sauce ( carbohydrate serving)  Evening Snack 1 extra small banana (1 carbohydrate serving)  1 tablespoon peanut butter   Carbohydrate Counting for People with Diabetes Vegan Sample 1-Day Menu  Breakfast 1 cup cooked oatmeal (2 carbohydrate servings)   cup blueberries (1 carbohydrate serving)  2 tablespoons flaxseeds  1 cup soymilk fortified with calcium and vitamin D  1 cup coffee  Lunch 2 slices whole wheat bread (2 carbohydrate servings)   cup baked tofu   cup lettuce  2 slices tomato  2 slices avocado   cup baby carrots ( carbohydrate serving)  1 orange (1 carbohydrate serving)  1 cup soymilk fortified with calcium and vitamin D   Evening Meal Burrito made with: 1 6-inch corn tortilla (1 carbohydrate serving)  1 cup refried vegetarian beans (2 carbohydrate servings)   cup chopped tomatoes   cup lettuce   cup salsa  1/3 cup brown rice (1 carbohydrate serving)  1 tablespoon olive oil for rice   cup zucchini   Evening Snack 6 small whole grain crackers (1 carbohydrate serving)  2 apricots ( carbohydrate serving)   cup unsalted peanuts ( carbohydrate serving)    Carbohydrate Counting for People with Diabetes Vegetarian (Lacto-Ovo) Sample 1-Day Menu  Breakfast 1 cup cooked oatmeal (2 carbohydrate servings)   cup blueberries (1 carbohydrate serving)  2 tablespoons flaxseeds  1 egg  1 cup 1% milk (1 carbohydrate serving)  1 cup coffee  Lunch 2 slices whole wheat bread (2 carbohydrate servings)  2 ounces low-fat cheese   cup lettuce  2 slices tomato  2 slices avocado   cup baby carrots ( carbohydrate serving)  1 orange (1 carbohydrate serving)  1 cup unsweetened tea  Evening Meal Burrito made with: 1 6-inch corn tortilla (1 carbohydrate serving)   cup refried vegetarian beans (1 carbohydrate serving)   cup tomatoes   cup lettuce    cup salsa  1/3 cup brown rice (1 carbohydrate serving)  1 tablespoon olive oil for rice   cup zucchini  1 cup 1% milk (1 carbohydrate serving)  Evening Snack 6 small whole grain crackers (1 carbohydrate serving)  2 apricots ( carbohydrate serving)   cup unsalted peanuts ( carbohydrate serving)    Copyright 2020  Academy of Nutrition and Dietetics. All rights reserved.  Using Nutrition Labels: Carbohydrate  Serving Size  Look at the serving size. All the information on the label is based on this portion. Servings Per Container  The number of servings contained in the package. Guidelines for Carbohydrate  Look at  the total grams of carbohydrate in the serving size.  1 carbohydrate choice = 15 grams of carbohydrate. Range of Carbohydrate Grams Per Choice  Carbohydrate Grams/Choice Carbohydrate Choices  6-10   11-20 1  21-25 1  26-35 2  36-40 2  41-50 3  51-55 3  56-65 4  66-70 4  71-80 5    Copyright 2020  Academy of Nutrition and Dietetics. All rights reserved.

## 2023-07-01 NOTE — Progress Notes (Signed)
1      PROGRESS NOTE    Bryan Flowers  ZOX:[id number] DOB: [birth year] DOA: 06/30/2023 PCP: Berniece Salines, FNP    Brief Narrative:  62 y.o. male with medical history significant of type 2 diabetes, Parkinson's disease, bipolar 1 disorder, hypertension, hyperlipidemia, peptic ulcer disease, OSA on CPAP, admitted for dizziness and was found to have mild DKA.   Assessment & Plan:   Principal Problem:   DKA (diabetic ketoacidosis) (HCC) Active Problems:   Vertigo   Uncontrolled type 2 diabetes mellitus with hyperglycemia (HCC)   Demand ischemia   Obstructive sleep apnea   Parkinson's disease   Essential hypertension   Bipolar 1 disorder, manic, moderate (HCC)  * DKA (diabetic ketoacidosis) (HCC) Patient is presenting with several day history of vertigo, with markedly elevated CBG. mild DKA on admission Much improved.  Did not require any insulin drip.   Vertigo Patient is presenting with dizziness that is most consistent with benign positional vertigo given symptoms only occur with movement of the head or body, however he has multiple risk factors for CVA. Differential also includes symptomatic hyperglycemia. - check orthostatics, Gentle IV hydration - MRI brain neg for acute patho - PT, OT recommends outpt eval   Uncontrolled type 2 diabetes mellitus with hyperglycemia (HCC) Per chart review, patient has a history of type 2 diabetes.  At most recent visit with PCP, they noted patient was a poor historian and frequently is inconsistent with his medications.  Last A1c available in chart elevated at 12.1% in July 2023.   - A1c 13.9 - Diabetic coordinator following   Demand ischemia Minimally elevated troponin in the absence of chest pain likely due to DKA.  No further intervention at this time.   Bipolar 1 disorder, manic, moderate (HCC) - Continue home regimen   Essential hypertension - Resume amlodipine, continue propranolol   Parkinson's disease - Continue home  regimen   Obstructive sleep apnea - CPAP at bedtime   DVT prophylaxis:  enoxaparin (LOVENOX) injection 40 mg Start: 06/30/23 2200     Code Status: Full code Family Communication: Updated Brother at bedside Disposition Plan: likely DC in 1-2 days depending on clinical condition   Subjective: Still feeling mild dizzy when he tried to get up for bathroom. Family (brother) at bedside  Objective: Vitals:   07/01/23 0324 07/01/23 0750 07/01/23 1204 07/01/23 1626  BP: 121/82 132/83 113/77 (!) 145/91  Pulse: 88 83 81 90  Resp: 18 20 20 19   Temp: (!) 97.5 F (36.4 C) (!) 97.2 F (36.2 C) 98.1 F (36.7 C) 97.9 F (36.6 C)  TempSrc: Oral   Oral  SpO2: 96% 94% 92% 98%  Weight:      Height:        Intake/Output Summary (Last 24 hours) at 07/01/2023 1908 Last data filed at 07/01/2023 1700 Gross per 24 hour  Intake 2127.14 ml  Output --  Net 2127.14 ml   Filed Weights   06/30/23 1156  Weight: 81.2 kg    Examination:  General exam: Appears calm and comfortable  Respiratory system: Clear to auscultation. Respiratory effort normal. Cardiovascular system: S1 & S2 heard, RRR. No JVD, murmurs, rubs, gallops or clicks. No pedal edema. Gastrointestinal system: Abdomen is nondistended, soft and nontender. No organomegaly or masses felt. Normal bowel sounds heard. Central nervous system: Alert and oriented. No focal neurological deficits. Extremities: Symmetric 5 x 5 power. Skin: No rashes, lesions or ulcers Psychiatry: Judgement and insight appear normal. Mood & affect appropriate.  Data Reviewed: I have personally reviewed following labs and imaging studies  CBC: Recent Labs  Lab 06/30/23 1156 07/01/23 0313  WBC 10.4 7.4  NEUTROABS  --  4.9  HGB 14.0 13.0  HCT 40.9 38.4*  MCV 85.0 85.7  PLT 292 285   Basic Metabolic Panel: Recent Labs  Lab 06/30/23 1156 06/30/23 1936 07/01/23 0313  NA 132* 136 137  K 4.3 3.7 3.6  CL 98 99 103  CO2 22 24 25   GLUCOSE 506*  201* 147*  BUN 31* 21 18  CREATININE 1.02 0.83 0.70  CALCIUM 8.8* 8.9 8.6*    HbA1C: Recent Labs    06/30/23 1155  HGBA1C 13.9*   CBG: Recent Labs  Lab 06/30/23 1945 07/01/23 0055 07/01/23 0326 07/01/23 1201 07/01/23 1628  GLUCAP 215* 174* 146* 188* 303*     Recent Results (from the past 240 hour(s))  SARS Coronavirus 2 by RT PCR (hospital order, performed in [facility] hospital lab) *cepheid single result test* Anterior Nasal Swab     Status: None   Collection Time: 06/30/23  1:33 PM   Specimen: Anterior Nasal Swab  Result Value Ref Range Status   SARS Coronavirus 2 by RT PCR NEGATIVE NEGATIVE Final    Comment: (NOTE) SARS-CoV-2 target nucleic acids are NOT DETECTED.  The SARS-CoV-2 RNA is generally detectable in upper and lower respiratory specimens during the acute phase of infection. The lowest concentration of SARS-CoV-2 viral copies this assay can detect is 250 copies / mL. A negative result does not preclude SARS-CoV-2 infection and should not be used as the sole basis for treatment or other patient management decisions.  A negative result may occur with improper specimen collection / handling, submission of specimen other than nasopharyngeal swab, presence of viral mutation(s) within the areas targeted by this assay, and inadequate number of viral copies (<250 copies / mL). A negative result must be combined with clinical observations, patient history, and epidemiological information.  Fact Sheet for Patients:   RoadLapTop.co.za  Fact Sheet for Healthcare Providers: http://kim-miller.com/  This test is not yet approved or  cleared by the [place] FDA and has been authorized for detection and/or diagnosis of SARS-CoV-2 by FDA under an Emergency Use Authorization (EUA).  This EUA will remain in effect (meaning this test can be used) for the duration of the COVID-19 declaration under Section 564(b)(1) of the  Act, 21 U.S.C. section 360bbb-3(b)(1), unless the authorization is terminated or revoked sooner.  Performed at [facility], [address], [place], [place] [address]          Radiology Studies: MR BRAIN WO CONTRAST  Result Date: 06/30/2023 CLINICAL DATA:  Neuro deficit, acute, stroke suspected. EXAM: MRI HEAD WITHOUT CONTRAST TECHNIQUE: Multiplanar, multiecho pulse sequences of the brain and surrounding structures were obtained without intravenous contrast. COMPARISON:  Head CT earlier same day.  MRI 04/20/2013. FINDINGS: Brain: Diffusion imaging does not show any acute or subacute infarction or other cause of restricted diffusion. No abnormality affects the brainstem or cerebellum. Cerebral hemispheres show age related volume loss without subjective lobar predominance. Mild gliosis seen in the deep white matter adjacent to the atrium of the right lateral ventricle could relate to small vessel disease or demyelinating disease. 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 No large vessel territory stroke. No mass, hemorrhage, hydrocephalus or extra-axial collection. Susceptibility artifact of unknown nature at the vertex on the left, possibly related to a tiny foreign object in the scalp.  I cannot identify this on the CT scan. Vascular: Major vessels at the base of the brain show flow. Skull and upper cervical spine: Negative Sinuses/Orbits: Clear/normal Other: None IMPRESSION: No acute finding by MRI. Age related volume loss. Mild gliosis in the deep white matter adjacent to the atrium of the right lateral ventricle could relate to small vessel disease or demyelinating disease. 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 Electronically Signed   By: Paulina Fusi M.D.   On: 06/30/2023 18:06   CT HEAD WO CONTRAST ( )  Result Date: 06/30/2023 CLINICAL DATA:  Neuro deficit, acute, stroke suspected. Vertigo times a few days with multiple falls.  EXAM: CT HEAD WITHOUT CONTRAST TECHNIQUE: Contiguous axial images were obtained from the base of the skull through the vertex without intravenous contrast. RADIATION DOSE REDUCTION: This exam was performed according to the departmental dose-optimization program which includes automated exposure control, adjustment of the mA and/or kV according to patient size and/or use of iterative reconstruction technique. COMPARISON:  Head CT 05/03/2022. FINDINGS: Brain: No acute hemorrhage. Unchanged mild chronic small-vessel disease. Cortical gray-white differentiation is otherwise preserved. Prominence of the ventricles and sulci within expected range for age. No hydrocephalus or extra-axial collection. No mass effect or midline shift. Vascular: No hyperdense vessel or unexpected calcification. Skull: No calvarial fracture or suspicious bone lesion. Skull base is unremarkable. Sinuses/Orbits: No acute finding. Other: None. IMPRESSION: 1. No acute intracranial hemorrhage or evidence of acute large vessel territory infarction. 2. Unchanged mild chronic small-vessel disease. Electronically Signed   By: Orvan Falconer M.D.   On: 06/30/2023 15:53   DG Chest 2 View  Result Date: 06/30/2023 CLINICAL DATA:  Dizziness. EXAM: CHEST - 2 VIEW COMPARISON:  Chest radiograph 05/07/2007. FINDINGS: Low lung volumes accentuate the pulmonary vasculature and cardiomediastinal silhouette. No consolidation or pulmonary edema. No pleural effusion or pneumothorax. IMPRESSION: Low lung volumes without evidence of acute cardiopulmonary disease. Electronically Signed   By: Orvan Falconer M.D.   On: 06/30/2023 13:56        Scheduled Meds:  DULoxetine  60 mg Oral Daily   enoxaparin (LOVENOX) injection  40 mg Subcutaneous Q24H   feeding supplement (GLUCERNA SHAKE)  237 mL Oral TID BM   [START ON 07/02/2023] influenza vac split trivalent PF  0.5 mL Intramuscular Tomorrow-1000   insulin aspart  0-15 Units Subcutaneous Q4H   lamoTRIgine  200  mg Oral Daily   LORazepam  1 mg Oral QHS   multivitamin with minerals  1 tablet Oral Daily   propranolol ER  60 mg Oral QHS   QUEtiapine  300 mg Oral QHS   rOPINIRole  2 mg Oral BID   Continuous Infusions:  sodium chloride 75 mL/hr at 07/01/23 1700     LOS: 1 day    Time spent: 35 mins    Lashannon Bresnan Sherryll Burger, MD [facility] Pager [phone]  If 7PM-7AM, please contact night-coverage [email or web address] Password [facility] 07/01/2023, 7:08 PM

## 2023-07-01 NOTE — Inpatient Diabetes Management (Addendum)
Inpatient Diabetes Program Recommendations  AACE/ADA: New Consensus Statement on Inpatient Glycemic Control (2015)  Target Ranges:  Prepandial:   less than 140 mg/dL      Peak postprandial:   less than 180 mg/dL (1-2 hours)      Critically ill patients:  140 - 180 mg/dL   Lab Results  Component Value Date   GLUCAP 188 (H) 07/01/2023   HGBA1C 13.9 (H) 06/30/2023    Review of Glycemic Control  Latest Reference Range & Units 06/30/23 12:01 06/30/23 15:11 06/30/23 17:29 06/30/23 19:45 07/01/23 00:55 07/01/23 03:26 07/01/23 12:01  Glucose-Capillary 70 - 99 mg/dL 865 (H) 784 (H) 696 (H) 215 (H) 174 (H) 146 (H) 188 (H)   Diabetes history: DM 2 Outpatient Diabetes medications: Metformin, insulin in the past, stopped 1 year ago Current orders for Inpatient glycemic control:  Novolog 0-15 units Q4 hours  Glucerna tid between meals  Inpatient Diabetes Program Recommendations:    -   Consider starting Semglee 8-10 units  Spoke with pt at bedside regarding A1c of 13.9% and glucose control at home. Pt reports being on metformin prior to d/c and checking glucose maybe 2-3 times a week. Pt has a glucometer at home. Pt reports being on insulin in the past but it was stopped about a year ago from his doctor that said he did not need it at that time. Pt said he gave insulin with an insulin pen.   Discussed current A1c level of 13.9%. Discussed glucose and A1c goals. Instructed pt to start checking glucose 2 times a day and follow up with his doctor. Told pt he would most likely be started back on insulin.   Will run benefits check on the basal insulin that would be covered best under pt insurance.  Nathen May and Evaristo Bury both $0 copay per pharmacy....  Thanks,  Christena Deem RN, MSN, BC-ADM Inpatient Diabetes Coordinator Team Pager [phone] (8a-5p)

## 2023-07-01 NOTE — Evaluation (Signed)
Physical Therapy Evaluation Patient Details Name: Bryan Flowers MRN: [id number] DOB: 10/10/1960 Today's Date: 07/01/2023  History of Present Illness  62 y/o male presented to ED on 06/30/23 for dizziness and multiple falls. Admitted for diabetic ketoacidosis. PMH: type 2 diabetes, Parkinson's disease, bipolar 1 disorder, hypertension, hyperlipidemia, peptic ulcer disease, OSA on CPAP  Clinical Impression  Patient admitted with the above. PTA, patient lives alone and reports independence with no AD, however has had 3 falls in past 3 days. He does not drive but has family to assist with driving to appointments. Patient presents with weakness, impaired balance, and decreased activity tolerance. Orthostatic BP assessed, see below. Able to complete bed mobility with supervision and STS with CGA. Ambulated 110' with no AD and CGA for safety with decreased foot clearance bilaterally. Patient will benefit from skilled PT services during acute stay to address listed deficits. Patient will benefit from ongoing therapy at discharge to maximize functional independence and safety.   Orthostatic BPs  Supine 134/91  Sitting 115/84 - complaints of dizziness upon sitting up  Standing 130/88  Standing after 3 min 122/75          If plan is discharge home, recommend the following: Assist for transportation   Can travel by private vehicle        Equipment Recommendations None recommended by PT (has necessary equipment at home)  Recommendations for Other Services       Functional Status Assessment Patient has had a recent decline in their functional status and demonstrates the ability to make significant improvements in function in a reasonable and predictable amount of time.     Precautions / Restrictions Precautions Precautions: Fall Restrictions Weight Bearing Restrictions: No      Mobility  Bed Mobility Overal bed mobility: Needs Assistance Bed Mobility: Supine to Sit     Supine to  sit: Supervision     General bed mobility comments: pt endorses brief dizziness wiht sitting that resolves quickly    Transfers Overall transfer level: Needs assistance Equipment used: 2 person hand held assist Transfers: Sit to/from Stand Sit to Stand: Contact guard assist           General transfer comment: cues for hand placement    Ambulation/Gait Ambulation/Gait assistance: Contact guard assist Gait Distance (Feet): 110 Feet Assistive device: None Gait Pattern/deviations: Step-through pattern, Decreased stride length, Shuffle Gait velocity: decreased     General Gait Details: decreased foot clearance bilaterally. CGA for safety due to unsteadiness. Encouraged use of RW or rollator at discharge for safety.  Stairs            Wheelchair Mobility     Tilt Bed    Modified Rankin (Stroke Patients Only)       Balance                                             Pertinent Vitals/Pain Pain Assessment Pain Assessment: No/denies pain    Home Living Family/patient expects to be discharged to:: Private residence Living Arrangements: Alone Available Help at Discharge: Family;Available PRN/intermittently Type of Home: Apartment Home Access: Level entry       Home Layout: One level Home Equipment: Rollator (4 wheels)      Prior Function Prior Level of Function : History of Falls (last six months);Independent/Modified Independent  Mobility Comments: indep, multiple recent falls ADLs Comments: indep, does not drive, family takes to stores or pt has groceries delivered     Extremity/Trunk Assessment   Upper Extremity Assessment Upper Extremity Assessment: Defer to OT evaluation    Lower Extremity Assessment Lower Extremity Assessment: Generalized weakness       Communication   Communication Communication: No apparent difficulties  Cognition Arousal: Alert Behavior During Therapy: WFL for tasks  assessed/performed Overall Cognitive Status: Within Functional Limits for tasks assessed                                          General Comments      Exercises     Assessment/Plan    PT Assessment Patient needs continued PT services  PT Problem List Decreased strength;Decreased activity tolerance;Decreased mobility;Decreased balance       PT Treatment Interventions DME instruction;Gait training;Functional mobility training;Therapeutic activities;Therapeutic exercise;Balance training;Patient/family education    PT Goals (Current goals can be found in the Care Plan section)  Acute Rehab PT Goals Patient Stated Goal: to get stronger PT Goal Formulation: With patient Time For Goal Achievement: 07/15/23 Potential to Achieve Goals: Good    Frequency Min 1X/week     Co-evaluation PT/OT/SLP Co-Evaluation/Treatment: Yes Reason for Co-Treatment: To address functional/ADL transfers PT goals addressed during session: Mobility/safety with mobility OT goals addressed during session: ADL's and self-care       AM-PAC PT "6 Clicks" Mobility  Outcome Measure Help needed turning from your back to your side while in a flat bed without using bedrails?: A Little Help needed moving from lying on your back to sitting on the side of a flat bed without using bedrails?: A Little Help needed moving to and from a bed to a chair (including a wheelchair)?: A Little Help needed standing up from a chair using your arms (e.g., wheelchair or bedside chair)?: A Little Help needed to walk in hospital room?: A Little Help needed climbing 3-5 steps with a railing? : A Lot 6 Click Score: 17    End of Session   Activity Tolerance: Patient tolerated treatment well Patient left: in chair;with call bell/phone within reach;with chair alarm set Nurse Communication: Mobility status PT Visit Diagnosis: Unsteadiness on feet (R26.81);Muscle weakness (generalized) (M62.81)    Time:  1308-6578 PT Time Calculation (min) (ACUTE ONLY): 19 min   Charges:   PT Evaluation $PT Eval Low Complexity: 1 Low   PT General Charges $$ ACUTE PT VISIT: 1 Visit         Maylon Peppers, PT, DPT Physical Therapist - [facility]  [facility]   Valoria Tamburri A Barrett Holthaus 07/01/2023, 3:38 PM

## 2023-07-02 ENCOUNTER — Encounter (HOSPITAL_COMMUNITY): Payer: Self-pay | Admitting: Hematology

## 2023-07-02 DIAGNOSIS — E1165 Type 2 diabetes mellitus with hyperglycemia: Secondary | ICD-10-CM | POA: Diagnosis not present

## 2023-07-02 DIAGNOSIS — R42 Dizziness and giddiness: Secondary | ICD-10-CM | POA: Diagnosis not present

## 2023-07-02 DIAGNOSIS — E1110 Type 2 diabetes mellitus with ketoacidosis without coma: Secondary | ICD-10-CM | POA: Diagnosis not present

## 2023-07-02 DIAGNOSIS — F3112 Bipolar disorder, current episode manic without psychotic features, moderate: Secondary | ICD-10-CM | POA: Diagnosis not present

## 2023-07-02 LAB — CBC
HCT: 38.1 % — ABNORMAL LOW (ref 39.0–52.0)
Hemoglobin: 12.6 g/dL — ABNORMAL LOW (ref 13.0–17.0)
MCH: 28.8 pg (ref 26.0–34.0)
MCHC: 33.1 g/dL (ref 30.0–36.0)
MCV: 87 fL (ref 80.0–100.0)
Platelets: 254 10*3/uL (ref 150–400)
RBC: 4.38 MIL/uL (ref 4.22–5.81)
RDW: 13.4 % (ref 11.5–15.5)
WBC: 5.8 10*3/uL (ref 4.0–10.5)
nRBC: 0 % (ref 0.0–0.2)

## 2023-07-02 LAB — BASIC METABOLIC PANEL
Anion gap: 9 (ref 5–15)
BUN: 16 mg/dL (ref 8–23)
CO2: 25 mmol/L (ref 22–32)
Calcium: 8.6 mg/dL — ABNORMAL LOW (ref 8.9–10.3)
Chloride: 102 mmol/L (ref 98–111)
Creatinine, Ser: 0.73 mg/dL (ref 0.61–1.24)
GFR, Estimated: >60 mL/min (ref >60)
Glucose, Bld: 153 mg/dL — ABNORMAL HIGH (ref 70–99)
Potassium: 3.6 mmol/L (ref 3.5–5.1)
Sodium: 136 mmol/L (ref 135–145)

## 2023-07-02 LAB — GLUCOSE, CAPILLARY
Glucose-Capillary: 155 mg/dL — ABNORMAL HIGH (ref 70–99)
Glucose-Capillary: 161 mg/dL — ABNORMAL HIGH (ref 70–99)
Glucose-Capillary: 248 mg/dL — ABNORMAL HIGH (ref 70–99)
Glucose-Capillary: 283 mg/dL — ABNORMAL HIGH (ref 70–99)

## 2023-07-02 MED ORDER — MECLIZINE HCL 12.5 MG PO TABS: 12.5000 mg | ORAL_TABLET | Freq: Three times a day (TID) | ORAL | 0 refills | Status: AC | PRN

## 2023-07-02 MED ORDER — GLUCERNA SHAKE PO LIQD: 237.0000 mL | Freq: Three times a day (TID) | ORAL | 0 refills | Status: AC

## 2023-07-02 NOTE — Care Management Important Message (Signed)
Important Message  Patient Details  Name: EHTAN DELFAVERO MRN: [id number] Date of Birth: 1961/05/24   Important Message Given:  N/A - LOS <3 / Initial given by admissions     Olegario Messier A Brei Pociask 07/02/2023, 1:30 PM

## 2023-07-02 NOTE — Inpatient Diabetes Management (Signed)
Inpatient Diabetes Program Recommendations  AACE/ADA: New Consensus Statement on Inpatient Glycemic Control  Target Ranges:  Prepandial:   less than 140 mg/dL      Peak postprandial:   less than 180 mg/dL (1-2 hours)      Critically ill patients:  140 - 180 mg/dL    Latest Reference Range & Units 07/02/23 00:26 07/02/23 04:06 07/02/23 08:21  Glucose-Capillary 70 - 99 mg/dL 604 (H)  Novolog 5 units 161 (H)  Novolog 3 units 155 (H)    Latest Reference Range & Units 07/01/23 00:55 07/01/23 03:26 07/01/23 12:01 07/01/23 16:28 07/01/23 19:33  Glucose-Capillary 70 - 99 mg/dL 540 (H)  Novolog 3 units 146 (H)  Novolog 2 units 188 (H)  Novolog 3 units 303 (H)  Novolog 11 units 292 (H)  Novolog 8 units    Latest Reference Range & Units 06/30/23 11:55  Hemoglobin A1C 4.8 - 5.6 % 13.9 (H)   Review of Glycemic Control  Diabetes history: DM2 Outpatient Diabetes medications: Metformin; insulin in the past (insulin pens; stopped 1 year ago by PCP) Current orders for Inpatient glycemic control: Novolog 0-15 units Q4H  Inpatient Diabetes Program Recommendations:    Insulin: Please consider ordering Semglee 8 units daily.  HbgA1C:  A1C 13.9% on 06/30/23 indicating an average glucose of 352 mg/dl over the past 2-3 months. Patient reported to diabetes coordinator on 9/26 that he use to take insulin (PCP stopped 1 year ago).  Outpatient DM: If patient is discharged on insulin, please provide Rx for Tresiba [phone]) and insulin pen needles [phone]).  Thanks, Orlando Penner, RN, MSN, CDCES Diabetes Coordinator Inpatient Diabetes Program [phone] (Team Pager from 8am to 5pm)

## 2023-07-02 NOTE — Plan of Care (Signed)
  Problem: Education: Goal: Ability to describe self-care measures that may prevent or decrease complications (Diabetes Survival Skills Education) will improve Outcome: Progressing Goal: Individualized Educational Video(s) Outcome: Progressing   Problem: Nutritional: Goal: Maintenance of adequate nutrition will improve Outcome: Progressing Goal: Progress toward achieving an optimal weight will improve Outcome: Progressing   Problem: Skin Integrity: Goal: Risk for impaired skin integrity will decrease Outcome: Progressing   Problem: Activity: Goal: Risk for activity intolerance will decrease Outcome: Progressing   Problem: Pain Managment: Goal: General experience of comfort will improve Outcome: Progressing   Problem: Safety: Goal: Ability to remain free from injury will improve Outcome: Progressing

## 2023-07-02 NOTE — TOC Initial Note (Signed)
Transition of Care [facility]) - Initial/Assessment Note    Patient Details  Name: Bryan Flowers MRN: [id number] Date of Birth: 04-May-1961  Transition of Care [facility]) CM/SW Contact:    Marlowe Sax, RN Phone Number: 07/02/2023, 11:12 AM  Clinical Narrative:                 Met with the patient and his brother in the room The brother will provide transportation home does not drive, family takes to stores or pt has groceries delivered  He walks without assistance I provided information for [employer] for resources His brother asked about him going to live in Independent living somewhere, I explained that insurance does not cover that The patient stated that he has applied for CAP program and does not qualify He stated that he needs someone to come clean his apartment I explained that Insurance does not cover house keeping services but if he needed PCS service I can provide that information for him to self pay, he stated that he can not pay for it  He will call elder care to see what resources they have for him also find [email or web address]  Expected Discharge Plan: Home/Self Care Barriers to Discharge: No Barriers Identified   Patient Goals and CMS Choice   Get well            Expected Discharge Plan and Services   Discharge Planning Services: CM Consult   Living arrangements for the past 2 months: Apartment                 DME Arranged: N/A         HH Arranged: NA          Prior Living Arrangements/Services Living arrangements for the past 2 months: Apartment Lives with:: Self (does not drive, family takes to stores or pt has groceries delivered) Patient language and need for interpreter reviewed:: Yes Do you feel safe going back to the place where you live?: Yes      Need for Family Participation in Patient Care: No (Comment) Care giver support system in place?: Yes (comment)   Criminal Activity/Legal Involvement Pertinent to Current Situation/Hospitalization: No -  Comment as needed  Activities of Daily Living   ADL Screening (condition at time of admission) Does the patient have a NEW difficulty with bathing/dressing/toileting/self-feeding that is expected to last >3 days?: No Does the patient have a NEW difficulty with getting in/out of bed, walking, or climbing stairs that is expected to last >3 days?: No Does the patient have a NEW difficulty with communication that is expected to last >3 days?: No Is the patient deaf or have difficulty hearing?: No Does the patient have difficulty seeing, even when wearing glasses/contacts?: No Does the patient have difficulty concentrating, remembering, or making decisions?: No  Permission Sought/Granted   Permission granted to share information with : Yes, Verbal Permission Granted              Emotional Assessment Appearance:: Appears stated age Attitude/Demeanor/Rapport: Engaged Affect (typically observed): Accepting, Pleasant Orientation: : Oriented to Self, Oriented to Place, Oriented to  Time, Oriented to Situation Alcohol / Substance Use: Not Applicable Psych Involvement: No (comment)  Admission diagnosis:  Vertigo [R42] DKA (diabetic ketoacidosis) (HCC) [E11.10] Hyperglycemia [R73.9] Generalized weakness [R53.1] Patient Active Problem List   Diagnosis Date Noted   DKA (diabetic ketoacidosis) (HCC) 06/30/2023   Vertigo 06/30/2023   Demand ischemia 06/30/2023   MDD (major depressive disorder) 05/01/2022   MDD (major depressive  disorder), recurrent, severe, with psychosis (HCC) 04/30/2022   Depression    Uncontrolled type 2 diabetes mellitus with hyperglycemia (HCC) 09/16/2021   Disturbance of understanding 09/16/2021   Noncompliance with medications 09/16/2021   Insulin dependent type 2 diabetes mellitus (HCC) 03/20/2021   Class 2 severe obesity with serious comorbidity and body mass index (BMI) of 35.0 to 35.9 in adult (HCC) 02/16/2019   Pure hypercholesterolemia 02/16/2019   Grief  02/16/2019   Major depression, recurrent (HCC) 03/14/2018   Bipolar 1 disorder, manic, moderate (HCC)    Vitamin D deficiency 09/10/2017   Gastroesophageal reflux disease without esophagitis 09/10/2017   History of iron deficiency anemia 03/30/2016   History of iron deficiency anemia 03/24/2016   History of GI bleed 03/15/2016   Hyponatremia 03/15/2016   Parkinson's disease    Essential hypertension    Anxiety 08/19/2015   Abnormal ECG 04/09/2015   Back ache 04/09/2015   Diabetes mellitus type 2 in obese 04/09/2015   Obstructive sleep apnea 04/09/2015   HDL deficiency 04/09/2015   Hearing loss, sensorineural, combined types 04/09/2015   HLD (hyperlipidemia) 04/09/2015   Vocal cord atrophy 05/29/2014   Fibromyalgia 03/15/2014   Testicular hypofunction 05/17/2009   Migraine with aura 05/14/2009   PCP:  Berniece Salines, FNP Pharmacy:   [id number] [place], [place] - [address] AT [facility] [address] [place] [address] Phone: [phone] Fax: [phone]  [facility] - [place], [place] - [address] [place] [address] Phone: [phone] Fax: [phone]     Social Determinants of Health (SDOH) Social History: SDOH Screenings   Food Insecurity: Food Insecurity Present (07/01/2023)  Housing: Medium Risk (07/01/2023)  Transportation Needs: No Transportation Needs (07/01/2023)  Utilities: Not At Risk (07/01/2023)  Alcohol Screen: Low Risk  (04/30/2022)  Depression (PHQ2-9): Low Risk  (09/16/2021)  Financial Resource Strain: Low Risk  (02/25/2021)  Physical Activity: Sufficiently Active (02/25/2021)  Social Connections: Moderately Isolated (02/25/2021)  Stress: No Stress Concern Present (02/25/2021)  Tobacco Use: Low Risk  (04/30/2022)   SDOH Interventions:     Readmission Risk Interventions     No data to display

## 2023-07-03 DIAGNOSIS — R55 Syncope and collapse: Secondary | ICD-10-CM | POA: Diagnosis not present

## 2023-07-03 DIAGNOSIS — I499 Cardiac arrhythmia, unspecified: Secondary | ICD-10-CM | POA: Diagnosis not present

## 2023-07-06 ENCOUNTER — Telehealth: Payer: Self-pay | Admitting: Nurse Practitioner

## 2023-07-06 NOTE — Discharge Summary (Signed)
Physician Discharge Summary   Patient: Bryan Flowers MRN: [id number] DOB: 1961-03-12  Admit date:     06/30/2023  Discharge date: 07/02/2023  Discharge Physician: Delfino Lovett   PCP: Berniece Salines, FNP   Recommendations at discharge:    Follow-up with outpatient provider as requested  Discharge Diagnoses: Principal Problem:   DKA (diabetic ketoacidosis) (HCC) Active Problems:   Vertigo   Uncontrolled type 2 diabetes mellitus with hyperglycemia (HCC)   Demand ischemia   Obstructive sleep apnea   Parkinson's disease   Essential hypertension   Bipolar 1 disorder, manic, moderate [facility])  Hospital Course: Assessment and Plan:  62 y.o. male with medical history significant of type 2 diabetes, Parkinson's disease, bipolar 1 disorder, hypertension, hyperlipidemia, peptic ulcer disease, OSA on CPAP, admitted for dizziness and was found to have mild DKA.   * DKA (diabetic ketoacidosis) (HCC) Patient is presenting with several day history of vertigo, with markedly elevated CBG. mild DKA on admission Much improved.  Did not require any insulin drip.   Vertigo Patient is presenting with dizziness that is most consistent with benign positional vertigo given symptoms only occur with movement of the head or body, however he has multiple risk factors for CVA. Differential also includes symptomatic hyperglycemia. -It is improved with hydration.  He ambulated well.  He was provided as needed meclizine at discharge - MRI brain neg for acute patho - PT, OT evaluated while in the hospital and recommended outpt eval   Uncontrolled type 2 diabetes mellitus with hyperglycemia (HCC) Per chart review, patient has a history of type 2 diabetes.  At most recent visit with PCP, they noted patient was a poor historian and frequently is inconsistent with his medications.  Last A1c available in chart elevated at 12.1% in July 2023.  - A1c 13.9 this admission   Demand ischemia Minimally elevated troponin  in the absence of chest pain likely due to DKA.  No further intervention at this time.   Bipolar 1 disorder, manic, moderate (HCC) - Continue home regimen   Essential hypertension   Parkinson's disease - Encouraged and recommended follow-up with his neurologist as an outpatient   Obstructive sleep apnea - CPAP at bedtime         Disposition: Home Diet recommendation:  Discharge Diet Orders (From admission, onward)     Start     Ordered   07/02/23 0000  Diet - low sodium heart healthy        07/02/23 1253           Carb modified diet DISCHARGE MEDICATION: Allergies as of 07/02/2023   No Known Allergies      Medication List     STOP taking these medications    carbidopa-levodopa 50-200 MG tablet Commonly known as: SINEMET CR       TAKE these medications    cholecalciferol 25 MCG (1000 UNIT) tablet Commonly known as: VITAMIN D3 Take 1,000 Units by mouth daily.   DULoxetine 60 MG capsule Commonly known as: CYMBALTA Take 1 capsule (60 mg total) by mouth daily.   feeding supplement (GLUCERNA SHAKE) Liqd Take 237 mLs by mouth 3 (three) times daily between meals.   lamoTRIgine 200 MG tablet Commonly known as: LAMICTAL Take 200 mg by mouth daily.   LORazepam 1 MG tablet Commonly known as: ATIVAN Take 1 mg by mouth at bedtime.   meclizine 12.5 MG tablet Commonly known as: ANTIVERT Take 1 tablet (12.5 mg total) by mouth 3 (three) times daily as  needed for up to 10 days for dizziness.   OneTouch Verio test strip Generic drug: glucose blood DX:E11.69, LON:99 Check TID What changed: Another medication with the same name was removed. Continue taking this medication, and follow the directions you see here.   propranolol ER 60 MG 24 hr capsule Commonly known as: INDERAL LA Take 60 mg by mouth at bedtime.   QUEtiapine 300 MG tablet Commonly known as: SEROQUEL Take 300 mg by mouth at bedtime.   rOPINIRole 2 MG tablet Commonly known as: REQUIP Take  2 mg by mouth 2 (two) times daily.   traZODone 50 MG tablet Commonly known as: DESYREL Take 1 tablet (50 mg total) by mouth at bedtime as needed for sleep.        Follow-up Information     Berniece Salines, FNP. Go on 07/12/2023.   Specialty: Nurse Practitioner Why: [facility] Discharge F/UP  Appt @ 2:00 pm Contact information: [address] [place] [address] [phone]         Lonell Face, MD. Schedule an appointment as soon as possible for a visit in 2 week(s).   Specialty: Neurology Why: [facility] Discharge F/UP  Call on Monday to schedule an appointment Contact information: [address] [facility] [place] [address] [phone]                Discharge Exam: Ceasar Mons Weights   06/30/23 1156  Weight: 81.2 kg   General exam: Appears calm and comfortable  Respiratory system: Clear to auscultation. Respiratory effort normal. Cardiovascular system: S1 & S2 heard, RRR. No JVD, murmurs, rubs, gallops or clicks. No pedal edema. Gastrointestinal system: Abdomen is nondistended, soft and nontender. No organomegaly or masses felt. Normal bowel sounds heard. Central nervous system: Alert and oriented. No focal neurological deficits. Extremities: Symmetric 5 x 5 power. Skin: No rashes, lesions or ulcers Psychiatry: Judgement and insight appear normal. Mood & affect appropriate.   Condition at discharge: fair  The results of significant diagnostics from this hospitalization (including imaging, microbiology, ancillary and laboratory) are listed below for reference.   Imaging Studies: MR BRAIN WO CONTRAST  Result Date: 06/30/2023 CLINICAL DATA:  Neuro deficit, acute, stroke suspected. EXAM: MRI HEAD WITHOUT CONTRAST TECHNIQUE: Multiplanar, multiecho pulse sequences of the brain and surrounding structures were obtained without intravenous contrast. COMPARISON:  Head CT earlier same day.  MRI 04/20/2013.  FINDINGS: Brain: Diffusion imaging does not show any acute or subacute infarction or other cause of restricted diffusion. No abnormality affects the brainstem or cerebellum. Cerebral hemispheres show age related volume loss without subjective lobar predominance. Mild gliosis seen in the deep white matter adjacent to the atrium of the right lateral ventricle could relate to small vessel disease or demyelinating disease. 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 No large vessel territory stroke. No mass, hemorrhage, hydrocephalus or extra-axial collection. Susceptibility artifact of unknown nature at the vertex on the left, possibly related to a tiny foreign object in the scalp. I cannot identify this on the CT scan. Vascular: Major vessels at the base of the brain show flow. Skull and upper cervical spine: Negative Sinuses/Orbits: Clear/normal Other: None IMPRESSION: No acute finding by MRI. Age related volume loss. Mild gliosis in the deep white matter adjacent to the atrium of the right lateral ventricle could relate to small vessel disease or demyelinating disease. 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 Electronically Signed   By:  Paulina Fusi M.D.   On: 06/30/2023 18:06   CT HEAD WO CONTRAST ( )  Result Date: 06/30/2023 CLINICAL DATA:  Neuro deficit, acute, stroke suspected. Vertigo times a few days with multiple falls. EXAM: CT HEAD WITHOUT CONTRAST TECHNIQUE: Contiguous axial images were obtained from the base of the skull through the vertex without intravenous contrast. RADIATION DOSE REDUCTION: This exam was performed according to the departmental dose-optimization program which includes automated exposure control, adjustment of the mA and/or kV according to patient size and/or use of iterative reconstruction technique. COMPARISON:  Head CT 05/03/2022. FINDINGS: Brain: No acute hemorrhage. Unchanged mild chronic small-vessel disease. Cortical  gray-white differentiation is otherwise preserved. Prominence of the ventricles and sulci within expected range for age. No hydrocephalus or extra-axial collection. No mass effect or midline shift. Vascular: No hyperdense vessel or unexpected calcification. Skull: No calvarial fracture or suspicious bone lesion. Skull base is unremarkable. Sinuses/Orbits: No acute finding. Other: None. IMPRESSION: 1. No acute intracranial hemorrhage or evidence of acute large vessel territory infarction. 2. Unchanged mild chronic small-vessel disease. Electronically Signed   By: Orvan Falconer M.D.   On: 06/30/2023 15:53   DG Chest 2 View  Result Date: 06/30/2023 CLINICAL DATA:  Dizziness. EXAM: CHEST - 2 VIEW COMPARISON:  Chest radiograph 05/07/2007. FINDINGS: Low lung volumes accentuate the pulmonary vasculature and cardiomediastinal silhouette. No consolidation or pulmonary edema. No pleural effusion or pneumothorax. IMPRESSION: Low lung volumes without evidence of acute cardiopulmonary disease. Electronically Signed   By: Orvan Falconer M.D.   On: 06/30/2023 13:56    Microbiology: Results for orders placed or performed during the hospital encounter of 06/30/23  SARS Coronavirus 2 by RT PCR (hospital order, performed in [facility] hospital lab) *cepheid single result test* Anterior Nasal Swab     Status: None   Collection Time: 06/30/23  1:33 PM   Specimen: Anterior Nasal Swab  Result Value Ref Range Status   SARS Coronavirus 2 by RT PCR NEGATIVE NEGATIVE Final    Comment: (NOTE) SARS-CoV-2 target nucleic acids are NOT DETECTED.  The SARS-CoV-2 RNA is generally detectable in upper and lower respiratory specimens during the acute phase of infection. The lowest concentration of SARS-CoV-2 viral copies this assay can detect is 250 copies / mL. A negative result does not preclude SARS-CoV-2 infection and should not be used as the sole basis for treatment or other patient management decisions.  A negative  result may occur with improper specimen collection / handling, submission of specimen other than nasopharyngeal swab, presence of viral mutation(s) within the areas targeted by this assay, and inadequate number of viral copies (<250 copies / mL). A negative result must be combined with clinical observations, patient history, and epidemiological information.  Fact Sheet for Patients:   RoadLapTop.co.za  Fact Sheet for Healthcare Providers: http://kim-miller.com/  This test is not yet approved or  cleared by the [place] FDA and has been authorized for detection and/or diagnosis of SARS-CoV-2 by FDA under an Emergency Use Authorization (EUA).  This EUA will remain in effect (meaning this test can be used) for the duration of the COVID-19 declaration under Section 564(b)(1) of the Act, 21 U.S.C. section 360bbb-3(b)(1), unless the authorization is terminated or revoked sooner.  Performed at [facility], [address] [place], [place] [address]     Labs: CBC: Recent Labs  Lab 06/30/23 1156 07/01/23 0313 07/02/23 0441  WBC 10.4 7.4 5.8  NEUTROABS  --  4.9  --   HGB 14.0 13.0 12.6*  HCT 40.9 38.4* 38.1*  MCV 85.0 85.7 87.0  PLT 292 285 254   Basic Metabolic Panel: Recent Labs  Lab 06/30/23 1156 06/30/23 1936 07/01/23 0313 07/02/23 0441  NA 132* 136 137 136  K 4.3 3.7 3.6 3.6  CL 98 99 103 102  CO2 22 24 25 25   GLUCOSE 506* 201* 147* 153*  BUN 31* 21 18 16   CREATININE 1.02 0.83 0.70 0.73  CALCIUM 8.8* 8.9 8.6* 8.6*   Liver Function Tests: No results for input(s): "AST", "ALT", "ALKPHOS", "BILITOT", "PROT", "ALBUMIN" in the last 168 hours. CBG: Recent Labs  Lab 07/01/23 1933 07/02/23 0026 07/02/23 0406 07/02/23 0821 07/02/23 1158  GLUCAP 292* 248* 161* 155* 283*    Discharge time spent: greater than 30 minutes.  Signed: Delfino Lovett, MD [facility] 17-Jul-2023

## 2023-07-06 NOTE — Telephone Encounter (Signed)
Copied from CRM [id number]. Topic: General - Deceased Patient >> 07/29/23 10:32 AM De Blanch wrote: Reason for CRM: Fayrene Fearing from [facility] Service is asking if Bryan Flowers will sign the death certificate for pt. Pt died on 07-26-23 and is asking if it is okay to forward to PCP to sigh for cremation.  Fayrene Fearing is requesting a callback.

## 2023-07-06 DEATH — deceased

## 2023-07-12 ENCOUNTER — Inpatient Hospital Stay: Payer: PPO | Admitting: Nurse Practitioner
